# Patient Record
Sex: Female | Born: 1945
Health system: Southern US, Community
[De-identification: ages and names within clinical notes are randomized; demographics above are authoritative.]

## PROBLEM LIST (undated history)

## (undated) DIAGNOSIS — G4733 Obstructive sleep apnea (adult) (pediatric): Secondary | ICD-10-CM

## (undated) DIAGNOSIS — E785 Hyperlipidemia, unspecified: Secondary | ICD-10-CM

## (undated) DIAGNOSIS — I219 Acute myocardial infarction, unspecified: Secondary | ICD-10-CM

## (undated) DIAGNOSIS — F32A Depression, unspecified: Secondary | ICD-10-CM

## (undated) DIAGNOSIS — I2510 Atherosclerotic heart disease of native coronary artery without angina pectoris: Secondary | ICD-10-CM

## (undated) DIAGNOSIS — F419 Anxiety disorder, unspecified: Secondary | ICD-10-CM

## (undated) DIAGNOSIS — G8929 Other chronic pain: Secondary | ICD-10-CM

## (undated) DIAGNOSIS — J449 Chronic obstructive pulmonary disease, unspecified: Secondary | ICD-10-CM

## (undated) DIAGNOSIS — K449 Diaphragmatic hernia without obstruction or gangrene: Secondary | ICD-10-CM

## (undated) DIAGNOSIS — J189 Pneumonia, unspecified organism: Secondary | ICD-10-CM

## (undated) DIAGNOSIS — I209 Angina pectoris, unspecified: Secondary | ICD-10-CM

## (undated) DIAGNOSIS — F329 Major depressive disorder, single episode, unspecified: Secondary | ICD-10-CM

## (undated) DIAGNOSIS — Z8679 Personal history of other diseases of the circulatory system: Secondary | ICD-10-CM

## (undated) DIAGNOSIS — I10 Essential (primary) hypertension: Secondary | ICD-10-CM

## (undated) DIAGNOSIS — Z9989 Dependence on other enabling machines and devices: Secondary | ICD-10-CM

## (undated) DIAGNOSIS — I499 Cardiac arrhythmia, unspecified: Secondary | ICD-10-CM

## (undated) DIAGNOSIS — M419 Scoliosis, unspecified: Secondary | ICD-10-CM

## (undated) DIAGNOSIS — H3530 Unspecified macular degeneration: Secondary | ICD-10-CM

## (undated) DIAGNOSIS — Z9861 Coronary angioplasty status: Principal | ICD-10-CM

## (undated) DIAGNOSIS — M1990 Unspecified osteoarthritis, unspecified site: Secondary | ICD-10-CM

## (undated) DIAGNOSIS — K219 Gastro-esophageal reflux disease without esophagitis: Secondary | ICD-10-CM

## (undated) DIAGNOSIS — R0600 Dyspnea, unspecified: Secondary | ICD-10-CM

## (undated) DIAGNOSIS — M549 Dorsalgia, unspecified: Secondary | ICD-10-CM

## (undated) DIAGNOSIS — E119 Type 2 diabetes mellitus without complications: Secondary | ICD-10-CM

## (undated) HISTORY — DX: Hyperlipidemia, unspecified: E78.5

## (undated) HISTORY — DX: Personal history of other diseases of the circulatory system: Z86.79

## (undated) HISTORY — PX: CORONARY ANGIOPLASTY: SHX604

## (undated) HISTORY — DX: Atherosclerotic heart disease of native coronary artery without angina pectoris: I25.10

## (undated) HISTORY — PX: KNEE ARTHROSCOPY: SHX127

## (undated) HISTORY — PX: VAGINAL HYSTERECTOMY: SUR661

## (undated) HISTORY — DX: Diaphragmatic hernia without obstruction or gangrene: K44.9

## (undated) HISTORY — PX: PARS PLANA VITRECTOMY W/ REPAIR OF MACULAR HOLE: SHX2170

## (undated) HISTORY — DX: Coronary angioplasty status: Z98.61

## (undated) HISTORY — PX: CARDIAC CATHETERIZATION: SHX172

## (undated) HISTORY — PX: DILATION AND CURETTAGE OF UTERUS: SHX78

## (undated) HISTORY — PX: DIAGNOSTIC LAPAROSCOPY: SUR761

## (undated) HISTORY — DX: Anxiety disorder, unspecified: F41.9

## (undated) HISTORY — DX: Obstructive sleep apnea (adult) (pediatric): G47.33

## (undated) HISTORY — PX: LIPOMA EXCISION: SHX5283

## (undated) HISTORY — DX: Essential (primary) hypertension: I10

## (undated) HISTORY — PX: EYE SURGERY: SHX253

---

## 2000-01-05 ENCOUNTER — Encounter: Payer: Self-pay | Admitting: Ophthalmology

## 2000-01-07 ENCOUNTER — Ambulatory Visit (HOSPITAL_COMMUNITY): Admission: RE | Admit: 2000-01-07 | Discharge: 2000-01-08 | Payer: Self-pay | Admitting: Ophthalmology

## 2001-04-09 ENCOUNTER — Ambulatory Visit (HOSPITAL_COMMUNITY): Admission: RE | Admit: 2001-04-09 | Discharge: 2001-04-09 | Payer: Self-pay | Admitting: Orthopedic Surgery

## 2001-04-09 ENCOUNTER — Encounter: Payer: Self-pay | Admitting: Orthopedic Surgery

## 2001-06-13 ENCOUNTER — Ambulatory Visit (HOSPITAL_COMMUNITY): Admission: RE | Admit: 2001-06-13 | Discharge: 2001-06-13 | Payer: Self-pay | Admitting: *Deleted

## 2001-06-13 ENCOUNTER — Other Ambulatory Visit: Admission: RE | Admit: 2001-06-13 | Discharge: 2001-06-13 | Payer: Self-pay | Admitting: *Deleted

## 2001-06-13 ENCOUNTER — Encounter: Payer: Self-pay | Admitting: *Deleted

## 2001-09-10 ENCOUNTER — Ambulatory Visit (HOSPITAL_COMMUNITY): Admission: RE | Admit: 2001-09-10 | Discharge: 2001-09-10 | Payer: Self-pay | Admitting: Family Medicine

## 2001-09-10 ENCOUNTER — Encounter: Payer: Self-pay | Admitting: Family Medicine

## 2002-01-15 ENCOUNTER — Encounter: Payer: Self-pay | Admitting: Family Medicine

## 2002-01-15 ENCOUNTER — Ambulatory Visit (HOSPITAL_COMMUNITY): Admission: RE | Admit: 2002-01-15 | Discharge: 2002-01-15 | Payer: Self-pay | Admitting: Family Medicine

## 2002-02-12 ENCOUNTER — Encounter: Payer: Self-pay | Admitting: Internal Medicine

## 2002-02-12 ENCOUNTER — Ambulatory Visit (HOSPITAL_COMMUNITY): Admission: RE | Admit: 2002-02-12 | Discharge: 2002-02-12 | Payer: Self-pay | Admitting: Internal Medicine

## 2002-03-01 ENCOUNTER — Encounter: Payer: Self-pay | Admitting: Orthopaedic Surgery

## 2002-03-01 ENCOUNTER — Ambulatory Visit (HOSPITAL_COMMUNITY): Admission: RE | Admit: 2002-03-01 | Discharge: 2002-03-01 | Payer: Self-pay | Admitting: Orthopaedic Surgery

## 2002-06-19 ENCOUNTER — Ambulatory Visit (HOSPITAL_COMMUNITY): Admission: RE | Admit: 2002-06-19 | Discharge: 2002-06-19 | Payer: Self-pay | Admitting: Family Medicine

## 2002-06-19 ENCOUNTER — Encounter: Payer: Self-pay | Admitting: Family Medicine

## 2002-10-08 ENCOUNTER — Ambulatory Visit (HOSPITAL_COMMUNITY): Admission: RE | Admit: 2002-10-08 | Discharge: 2002-10-08 | Payer: Self-pay | Admitting: Ophthalmology

## 2003-09-03 ENCOUNTER — Encounter: Payer: Self-pay | Admitting: Family Medicine

## 2003-09-03 ENCOUNTER — Ambulatory Visit (HOSPITAL_COMMUNITY): Admission: RE | Admit: 2003-09-03 | Discharge: 2003-09-03 | Payer: Self-pay | Admitting: Family Medicine

## 2003-10-10 ENCOUNTER — Observation Stay (HOSPITAL_COMMUNITY): Admission: RE | Admit: 2003-10-10 | Discharge: 2003-10-11 | Payer: Self-pay | Admitting: Cardiology

## 2004-02-05 ENCOUNTER — Ambulatory Visit (HOSPITAL_COMMUNITY): Admission: RE | Admit: 2004-02-05 | Discharge: 2004-02-05 | Payer: Self-pay | Admitting: Orthopaedic Surgery

## 2004-02-08 ENCOUNTER — Inpatient Hospital Stay (HOSPITAL_COMMUNITY): Admission: EM | Admit: 2004-02-08 | Discharge: 2004-02-11 | Payer: Self-pay | Admitting: Emergency Medicine

## 2004-03-04 ENCOUNTER — Ambulatory Visit (HOSPITAL_COMMUNITY): Admission: RE | Admit: 2004-03-04 | Discharge: 2004-03-04 | Payer: Self-pay | Admitting: Family Medicine

## 2004-12-23 ENCOUNTER — Observation Stay (HOSPITAL_COMMUNITY): Admission: AD | Admit: 2004-12-23 | Discharge: 2004-12-25 | Payer: Self-pay | Admitting: Internal Medicine

## 2005-03-25 ENCOUNTER — Ambulatory Visit (HOSPITAL_COMMUNITY): Admission: RE | Admit: 2005-03-25 | Discharge: 2005-03-25 | Payer: Self-pay | Admitting: Family Medicine

## 2005-03-29 ENCOUNTER — Ambulatory Visit (HOSPITAL_COMMUNITY): Admission: RE | Admit: 2005-03-29 | Discharge: 2005-03-29 | Payer: Self-pay | Admitting: Family Medicine

## 2005-04-18 ENCOUNTER — Emergency Department (HOSPITAL_COMMUNITY): Admission: EM | Admit: 2005-04-18 | Discharge: 2005-04-18 | Payer: Self-pay | Admitting: Emergency Medicine

## 2005-04-28 ENCOUNTER — Emergency Department (HOSPITAL_COMMUNITY): Admission: EM | Admit: 2005-04-28 | Discharge: 2005-04-28 | Payer: Self-pay | Admitting: *Deleted

## 2005-07-11 ENCOUNTER — Ambulatory Visit (HOSPITAL_COMMUNITY): Admission: RE | Admit: 2005-07-11 | Discharge: 2005-07-11 | Payer: Self-pay | Admitting: Family Medicine

## 2005-09-30 ENCOUNTER — Ambulatory Visit (HOSPITAL_COMMUNITY): Admission: RE | Admit: 2005-09-30 | Discharge: 2005-09-30 | Payer: Self-pay | Admitting: Family Medicine

## 2005-11-29 ENCOUNTER — Ambulatory Visit (HOSPITAL_COMMUNITY): Admission: RE | Admit: 2005-11-29 | Discharge: 2005-11-29 | Payer: Self-pay | Admitting: Family Medicine

## 2005-12-26 DIAGNOSIS — I219 Acute myocardial infarction, unspecified: Secondary | ICD-10-CM

## 2005-12-26 HISTORY — DX: Acute myocardial infarction, unspecified: I21.9

## 2005-12-27 ENCOUNTER — Encounter (INDEPENDENT_AMBULATORY_CARE_PROVIDER_SITE_OTHER): Payer: Self-pay | Admitting: *Deleted

## 2005-12-27 ENCOUNTER — Ambulatory Visit (HOSPITAL_COMMUNITY): Admission: RE | Admit: 2005-12-27 | Discharge: 2005-12-27 | Payer: Self-pay | Admitting: General Surgery

## 2006-07-04 ENCOUNTER — Ambulatory Visit (HOSPITAL_COMMUNITY): Admission: RE | Admit: 2006-07-04 | Discharge: 2006-07-04 | Payer: Self-pay | Admitting: *Deleted

## 2006-07-11 ENCOUNTER — Inpatient Hospital Stay (HOSPITAL_COMMUNITY): Admission: RE | Admit: 2006-07-11 | Discharge: 2006-07-19 | Payer: Self-pay | Admitting: Cardiology

## 2006-08-18 ENCOUNTER — Ambulatory Visit (HOSPITAL_COMMUNITY): Admission: RE | Admit: 2006-08-18 | Discharge: 2006-08-18 | Payer: Self-pay | Admitting: *Deleted

## 2006-08-22 ENCOUNTER — Ambulatory Visit (HOSPITAL_COMMUNITY): Admission: RE | Admit: 2006-08-22 | Discharge: 2006-08-22 | Payer: Self-pay | Admitting: *Deleted

## 2006-09-01 ENCOUNTER — Encounter (HOSPITAL_COMMUNITY): Admission: RE | Admit: 2006-09-01 | Discharge: 2006-09-23 | Payer: Self-pay | Admitting: *Deleted

## 2006-09-25 ENCOUNTER — Encounter (HOSPITAL_COMMUNITY): Admission: RE | Admit: 2006-09-25 | Discharge: 2006-10-25 | Payer: Self-pay | Admitting: *Deleted

## 2006-09-27 ENCOUNTER — Ambulatory Visit (HOSPITAL_COMMUNITY): Admission: RE | Admit: 2006-09-27 | Discharge: 2006-09-27 | Payer: Self-pay | Admitting: Family Medicine

## 2006-10-27 ENCOUNTER — Inpatient Hospital Stay (HOSPITAL_COMMUNITY): Admission: EM | Admit: 2006-10-27 | Discharge: 2006-11-01 | Payer: Self-pay | Admitting: Cardiology

## 2006-10-27 ENCOUNTER — Encounter (HOSPITAL_COMMUNITY): Admission: RE | Admit: 2006-10-27 | Discharge: 2006-11-26 | Payer: Self-pay | Admitting: *Deleted

## 2006-10-31 ENCOUNTER — Encounter (INDEPENDENT_AMBULATORY_CARE_PROVIDER_SITE_OTHER): Payer: Self-pay | Admitting: Specialist

## 2006-11-06 ENCOUNTER — Ambulatory Visit: Payer: Self-pay | Admitting: Internal Medicine

## 2006-11-27 ENCOUNTER — Encounter (HOSPITAL_COMMUNITY): Admission: RE | Admit: 2006-11-27 | Discharge: 2006-12-25 | Payer: Self-pay | Admitting: *Deleted

## 2007-01-01 ENCOUNTER — Encounter (HOSPITAL_COMMUNITY): Admission: RE | Admit: 2007-01-01 | Discharge: 2007-01-31 | Payer: Self-pay | Admitting: *Deleted

## 2007-01-02 ENCOUNTER — Ambulatory Visit (HOSPITAL_COMMUNITY): Admission: RE | Admit: 2007-01-02 | Discharge: 2007-01-02 | Payer: Self-pay | Admitting: Family Medicine

## 2007-01-22 ENCOUNTER — Observation Stay (HOSPITAL_COMMUNITY): Admission: EM | Admit: 2007-01-22 | Discharge: 2007-01-23 | Payer: Self-pay | Admitting: Emergency Medicine

## 2007-02-07 ENCOUNTER — Encounter (HOSPITAL_COMMUNITY): Admission: RE | Admit: 2007-02-07 | Discharge: 2007-03-09 | Payer: Self-pay | Admitting: *Deleted

## 2007-03-14 ENCOUNTER — Encounter (HOSPITAL_COMMUNITY): Admission: RE | Admit: 2007-03-14 | Discharge: 2007-04-13 | Payer: Self-pay | Admitting: *Deleted

## 2007-06-12 ENCOUNTER — Ambulatory Visit: Payer: Self-pay | Admitting: Gastroenterology

## 2007-06-12 LAB — CONVERTED CEMR LAB
Eosinophils Absolute: 0.2 10*3/uL (ref 0.0–0.6)
Eosinophils Relative: 2 % (ref 0.0–5.0)
HCT: 38.6 % (ref 36.0–46.0)
Lymphocytes Relative: 22.4 % (ref 12.0–46.0)
MCHC: 34.2 g/dL (ref 30.0–36.0)
Monocytes Relative: 6.6 % (ref 3.0–11.0)
Neutro Abs: 5.4 10*3/uL (ref 1.4–7.7)
RBC: 4.31 M/uL (ref 3.87–5.11)

## 2007-06-15 ENCOUNTER — Ambulatory Visit: Payer: Self-pay | Admitting: Gastroenterology

## 2007-08-24 ENCOUNTER — Emergency Department (HOSPITAL_COMMUNITY): Admission: EM | Admit: 2007-08-24 | Discharge: 2007-08-24 | Payer: Self-pay | Admitting: Emergency Medicine

## 2007-09-03 ENCOUNTER — Encounter (HOSPITAL_COMMUNITY): Admission: RE | Admit: 2007-09-03 | Discharge: 2007-09-25 | Payer: Self-pay | Admitting: Family Medicine

## 2007-10-15 ENCOUNTER — Ambulatory Visit (HOSPITAL_COMMUNITY): Admission: RE | Admit: 2007-10-15 | Discharge: 2007-10-15 | Payer: Self-pay | Admitting: Family Medicine

## 2007-11-05 ENCOUNTER — Encounter (HOSPITAL_COMMUNITY): Admission: RE | Admit: 2007-11-05 | Discharge: 2007-12-05 | Payer: Self-pay | Admitting: Family Medicine

## 2007-11-14 ENCOUNTER — Ambulatory Visit (HOSPITAL_COMMUNITY): Admission: RE | Admit: 2007-11-14 | Discharge: 2007-11-14 | Payer: Self-pay | Admitting: *Deleted

## 2007-11-19 ENCOUNTER — Ambulatory Visit (HOSPITAL_COMMUNITY): Admission: RE | Admit: 2007-11-19 | Discharge: 2007-11-19 | Payer: Self-pay | Admitting: *Deleted

## 2007-12-07 ENCOUNTER — Encounter (HOSPITAL_COMMUNITY): Admission: RE | Admit: 2007-12-07 | Discharge: 2008-01-06 | Payer: Self-pay | Admitting: Family Medicine

## 2008-01-07 ENCOUNTER — Encounter (HOSPITAL_COMMUNITY): Admission: RE | Admit: 2008-01-07 | Discharge: 2008-02-06 | Payer: Self-pay | Admitting: Family Medicine

## 2008-02-20 ENCOUNTER — Encounter (HOSPITAL_COMMUNITY): Admission: RE | Admit: 2008-02-20 | Discharge: 2008-03-21 | Payer: Self-pay | Admitting: Family Medicine

## 2008-03-31 ENCOUNTER — Encounter (HOSPITAL_COMMUNITY): Admission: RE | Admit: 2008-03-31 | Discharge: 2008-04-30 | Payer: Self-pay | Admitting: Family Medicine

## 2008-04-19 DIAGNOSIS — E039 Hypothyroidism, unspecified: Secondary | ICD-10-CM | POA: Insufficient documentation

## 2008-04-19 DIAGNOSIS — F341 Dysthymic disorder: Secondary | ICD-10-CM | POA: Insufficient documentation

## 2008-04-19 DIAGNOSIS — E119 Type 2 diabetes mellitus without complications: Secondary | ICD-10-CM | POA: Insufficient documentation

## 2008-04-19 DIAGNOSIS — K625 Hemorrhage of anus and rectum: Secondary | ICD-10-CM | POA: Insufficient documentation

## 2008-04-19 DIAGNOSIS — M129 Arthropathy, unspecified: Secondary | ICD-10-CM | POA: Insufficient documentation

## 2008-04-19 DIAGNOSIS — K219 Gastro-esophageal reflux disease without esophagitis: Secondary | ICD-10-CM | POA: Insufficient documentation

## 2008-04-19 DIAGNOSIS — I10 Essential (primary) hypertension: Secondary | ICD-10-CM | POA: Insufficient documentation

## 2008-04-19 DIAGNOSIS — I219 Acute myocardial infarction, unspecified: Secondary | ICD-10-CM | POA: Insufficient documentation

## 2008-04-19 DIAGNOSIS — K559 Vascular disorder of intestine, unspecified: Secondary | ICD-10-CM | POA: Insufficient documentation

## 2008-05-23 ENCOUNTER — Ambulatory Visit (HOSPITAL_COMMUNITY): Admission: RE | Admit: 2008-05-23 | Discharge: 2008-05-23 | Payer: Self-pay | Admitting: Internal Medicine

## 2008-06-24 ENCOUNTER — Ambulatory Visit (HOSPITAL_COMMUNITY): Admission: RE | Admit: 2008-06-24 | Discharge: 2008-06-24 | Payer: Self-pay | Admitting: Internal Medicine

## 2008-07-04 ENCOUNTER — Ambulatory Visit: Admission: RE | Admit: 2008-07-04 | Discharge: 2008-07-04 | Payer: Self-pay | Admitting: Internal Medicine

## 2008-07-14 ENCOUNTER — Inpatient Hospital Stay (HOSPITAL_COMMUNITY): Admission: EM | Admit: 2008-07-14 | Discharge: 2008-07-16 | Payer: Self-pay | Admitting: Emergency Medicine

## 2008-07-21 ENCOUNTER — Ambulatory Visit: Payer: Self-pay | Admitting: Internal Medicine

## 2008-09-30 ENCOUNTER — Ambulatory Visit (HOSPITAL_COMMUNITY): Admission: RE | Admit: 2008-09-30 | Discharge: 2008-09-30 | Payer: Self-pay | Admitting: Family Medicine

## 2008-10-17 ENCOUNTER — Ambulatory Visit (HOSPITAL_COMMUNITY): Admission: RE | Admit: 2008-10-17 | Discharge: 2008-10-17 | Payer: Self-pay | Admitting: Family Medicine

## 2009-02-26 ENCOUNTER — Ambulatory Visit (HOSPITAL_COMMUNITY): Admission: RE | Admit: 2009-02-26 | Discharge: 2009-02-26 | Payer: Self-pay | Admitting: Family Medicine

## 2009-10-20 ENCOUNTER — Ambulatory Visit (HOSPITAL_COMMUNITY): Admission: RE | Admit: 2009-10-20 | Discharge: 2009-10-20 | Payer: Self-pay | Admitting: Family Medicine

## 2010-01-30 ENCOUNTER — Observation Stay (HOSPITAL_COMMUNITY): Admission: EM | Admit: 2010-01-30 | Discharge: 2010-01-31 | Payer: Self-pay | Admitting: Emergency Medicine

## 2010-10-14 ENCOUNTER — Inpatient Hospital Stay (HOSPITAL_COMMUNITY): Admission: EM | Admit: 2010-10-14 | Discharge: 2010-10-15 | Payer: Self-pay | Admitting: Emergency Medicine

## 2010-10-21 ENCOUNTER — Ambulatory Visit (HOSPITAL_COMMUNITY): Admission: RE | Admit: 2010-10-21 | Discharge: 2010-10-21 | Payer: Self-pay | Admitting: Family Medicine

## 2010-11-19 ENCOUNTER — Ambulatory Visit (HOSPITAL_COMMUNITY): Admission: RE | Admit: 2010-11-19 | Discharge: 2010-11-19 | Payer: Self-pay | Admitting: Family Medicine

## 2011-03-09 LAB — DIFFERENTIAL
Basophils Absolute: 0 10*3/uL (ref 0.0–0.1)
Basophils Relative: 0 % (ref 0–1)
Eosinophils Relative: 2 % (ref 0–5)
Monocytes Absolute: 0.5 10*3/uL (ref 0.1–1.0)
Neutrophils Relative %: 60 % (ref 43–77)

## 2011-03-09 LAB — CBC
Hemoglobin: 12.4 g/dL (ref 12.0–15.0)
MCH: 30 pg (ref 26.0–34.0)
MCHC: 32.8 g/dL (ref 30.0–36.0)
MCV: 91.5 fL (ref 78.0–100.0)
RBC: 4.13 MIL/uL (ref 3.87–5.11)
WBC: 6.9 10*3/uL (ref 4.0–10.5)

## 2011-03-09 LAB — CARDIAC PANEL(CRET KIN+CKTOT+MB+TROPI)
CK, MB: 0.8 ng/mL (ref 0.3–4.0)
Relative Index: INVALID (ref 0.0–2.5)
Relative Index: INVALID (ref 0.0–2.5)
Total CK: 26 U/L (ref 7–177)
Troponin I: <0.01 ng/mL (ref 0.00–0.06)
Troponin I: <0.01 ng/mL (ref 0.00–0.06)

## 2011-03-09 LAB — BASIC METABOLIC PANEL
Calcium: 9.1 mg/dL (ref 8.4–10.5)
Chloride: 109 mEq/L (ref 96–112)
GFR calc non Af Amer: >60 mL/min (ref >60)
Glucose, Bld: 86 mg/dL (ref 70–99)
Potassium: 3.9 mEq/L (ref 3.5–5.1)
Sodium: 138 mEq/L (ref 135–145)

## 2011-03-09 LAB — POCT CARDIAC MARKERS
CKMB, poc: <1 ng/mL — ABNORMAL LOW (ref 1.0–8.0)
Myoglobin, poc: 17.6 ng/mL (ref 12–200)
Troponin i, poc: <0.05 ng/mL (ref 0.00–0.09)

## 2011-03-09 LAB — APTT: aPTT: 26 seconds (ref 24–37)

## 2011-03-18 LAB — LIPID PANEL
HDL: 69 mg/dL (ref >39)
LDL Cholesterol: 59 mg/dL (ref 0–99)
Total CHOL/HDL Ratio: 2.2 RATIO
Triglycerides: 125 mg/dL (ref <150)
VLDL: 25 mg/dL (ref 0–40)

## 2011-03-18 LAB — HEPATIC FUNCTION PANEL
AST: 20 U/L (ref 0–37)
Albumin: 3.4 g/dL — ABNORMAL LOW (ref 3.5–5.2)
Alkaline Phosphatase: 37 U/L — ABNORMAL LOW (ref 39–117)
Total Protein: 5.8 g/dL — ABNORMAL LOW (ref 6.0–8.3)

## 2011-03-18 LAB — CK TOTAL AND CKMB (NOT AT ARMC)
Relative Index: INVALID (ref 0.0–2.5)
Total CK: 28 U/L (ref 7–177)

## 2011-03-18 LAB — CBC
Hemoglobin: 12.2 g/dL (ref 12.0–15.0)
MCHC: 34.6 g/dL (ref 30.0–36.0)
MCV: 92.6 fL (ref 78.0–100.0)
Platelets: 161 10*3/uL (ref 150–400)
RBC: 3.89 MIL/uL (ref 3.87–5.11)
RBC: 4.23 MIL/uL (ref 3.87–5.11)
RDW: 12.1 % (ref 11.5–15.5)
WBC: 4 10*3/uL (ref 4.0–10.5)

## 2011-03-18 LAB — BASIC METABOLIC PANEL
BUN: 7 mg/dL (ref 6–23)
CO2: 26 mEq/L (ref 19–32)
CO2: 28 mEq/L (ref 19–32)
Calcium: 9.6 mg/dL (ref 8.4–10.5)
Chloride: 103 mEq/L (ref 96–112)
Creatinine, Ser: 0.71 mg/dL (ref 0.4–1.2)
Glucose, Bld: 92 mg/dL (ref 70–99)
Glucose, Bld: 93 mg/dL (ref 70–99)
Potassium: 4.1 mEq/L (ref 3.5–5.1)
Sodium: 141 mEq/L (ref 135–145)

## 2011-03-18 LAB — DIFFERENTIAL
Basophils Relative: 0 % (ref 0–1)
Eosinophils Relative: 1 % (ref 0–5)
Monocytes Absolute: 0.6 10*3/uL (ref 0.1–1.0)
Monocytes Relative: 9 % (ref 3–12)
Neutro Abs: 4.9 10*3/uL (ref 1.7–7.7)

## 2011-03-18 LAB — HEMOGLOBIN A1C
Hgb A1c MFr Bld: 5.9 % (ref 4.6–6.1)
Mean Plasma Glucose: 123 mg/dL

## 2011-03-18 LAB — TROPONIN I: Troponin I: 0.03 ng/mL (ref 0.00–0.06)

## 2011-03-18 LAB — GLUCOSE, CAPILLARY: Glucose-Capillary: 104 mg/dL — ABNORMAL HIGH (ref 70–99)

## 2011-03-18 LAB — RAPID STREP SCREEN (MED CTR MEBANE ONLY): Streptococcus, Group A Screen (Direct): NEGATIVE

## 2011-03-18 LAB — CARDIAC PANEL(CRET KIN+CKTOT+MB+TROPI)
CK, MB: 0.7 ng/mL (ref 0.3–4.0)
Total CK: 30 U/L (ref 7–177)
Troponin I: 0.01 ng/mL (ref 0.00–0.06)

## 2011-05-10 NOTE — Procedures (Signed)
NAME:  Vickie [name], Vickie [name]              ACCOUNT NO.:  [id number]   MEDICAL RECORD NO.:  [id number]          PATIENT TYPE:  OUT   LOCATION:  RESP                          FACILITY:  [facility]   PHYSICIAN:  Edward L. Juanetta Gosling, M.D.DATE OF BIRTH:  02/10/1946   DATE OF PROCEDURE:  DATE OF DISCHARGE:  11/19/2007                            PULMONARY FUNCTION TEST   PULMONARY FUNCTION TEST   1. Spirometry shows no ventilatory defect and evidence of airflow      obstruction in the smaller airways.  2. Lung volumes are normal.  3. DLCO is normal.  4. Arterial blood gases are normal.  5. There is actually improvement compared to previous pulmonary      function test study done January 2008.      Edward L. Juanetta Gosling, M.D.  Electronically Signed     ELH/MEDQ  D:  11/25/2007  T:  11/25/2007  Job:  [id number]   cc:   Dani Gobble, MD  Fax: [phone]

## 2011-05-10 NOTE — Cardiovascular Report (Signed)
NAME:  Vickie [name], Vickie [name] NO.:  [id number]   MEDICAL RECORD NO.:  [id number]          PATIENT TYPE:  INP   LOCATION:  2016                         FACILITY:  [facility]   PHYSICIAN:  Thereasa Solo. Little, M.D. DATE OF BIRTH:  02-09-1946   DATE OF PROCEDURE:  DATE OF DISCHARGE:                            CARDIAC CATHETERIZATION   INDICATION FOR TEST:  This 65 year old female has chronic chest pain.  She had had a cardiac catheterization performed in 2007, that showed  mild disease in her LAD.  She has had continued complaints of chest pain  with no significant abnormality being found, however.  She does have  significant GERD, and she has marked right upper quadrant tenderness to  palpation.  She was referred from Dr. Sharyon Medicus  office to the emergency  room with complaints of chest pain.  Her EKG was unremarkable and the  decision was made to take her to the cath lab to resolve the issue of  whether or not this could be cardiac.   After obtaining informed consent, the patient was prepped and draped in  usual sterile fashion exposing the right groin.  Following local  anesthetic with 1% Xylocaine, the Seldinger technique was employed and 5-  [place] introducer sheath was placed in the right femoral artery.  A  Spartan needle was used.  Left and right coronary arteriography and  ventriculogram in the RAO projection was performed.   COMPLICATIONS:  None.   EQUIPMENT:  5-French Judkins configuration catheters.   TOTAL CONTRAST USED:  80 mL.   RESULTS:  1. Hemodynamic monitoring, her central aortic pressure was 131/72.      Left ventricular pressure was 137/6 and the left ventricular end-      diastolic pressure was 11.  2. Ventriculography.  Ventriculography in the RAO projection done at      the end of procedure using 25 mL of contrast at 12 mL per second      showed the LV to be hyperdynamic with an ejection fraction of      excess of 75% and an end-diastolic pressure of  11.  3. Coronary arteriography.      a.     Left main normal, it bifurcated.      b.     LAD.  There were minimal proximal irregularities in the LAD.       The LAD extended down and around the apex of the heart and       actually supplied the distribution to the PDA  There was a very       large diagonal vessel that was free of disease.  The first septal       perforator which was a relatively long but small in diameter       vessel had an area of 80% narrowing in its ostium.  This type       lesion is not amenable to any type of percutaneous intervention       and is best treated medically.      c.     Circumflex.  The circumflex gave rise to  a very large OM       system that had 3 large OM vessels, all of which were free of       disease.      d.     Right coronary artery.  This was the nondominant vessel.       There was no evidence of a dissection that was previously       mentioned in 2007.   CONCLUSION:  1. Hyperdynamic left ventricle.  2. No significant occlusive disease in her major vessels with ostial      80% narrowing of the first septal perforator.   I have started her on Toprol-XL 25 mg because of her hyperdynamic left  ventricle.  I would encourage avoidance of nitroglycerin and calcium  channel-blockers other than verapamil or Cardizem.  Her liver functions  are normal, but she would probably benefit from a gallbladder scan  because of the abnormal physical exam, which includes marked tenderness.  If this is unremarkable, endoscopy may be an additional avenue to pursue  in view of her significant history of GERD.           ______________________________  Thereasa Solo. Little, M.D.     ABL/MEDQ  D:  07/14/2008  T:  07/15/2008  Job:  [id number]   cc:   Dani Gobble, MD  Madelin Rear. Sherwood Gambler, MD  Cardiac Catheterization Lab

## 2011-05-10 NOTE — Assessment & Plan Note (Signed)
[facility]                         GASTROENTEROLOGY OFFICE NOTE   Vickie [name], Vickie [name]                     MRN:          [id number]  DATE:06/12/2007                            DOB:          18-Feb-1946    REFERRING PHYSICIAN:  Kirk Ruths, M.D.   Dr. Regino Schultze asked me to evaluate Vickie [name] in consultation regarding  rectal bleeding.   HISTORY OF PRESENT ILLNESS:  Vickie [name] is a very pleasant 65 year old  woman, who has had at least 3 episodes of crampy abdominal discomfort  followed by bloody diarrhea.  She has been evaluated by 2 different  gastroenterologists including Dr. Juanda Chance and Dr. Jena Gauss.  Dr. Jena Gauss saw  her during one of these episodes.  He performed a colonoscopy on her in  2005, and he described some segmental colitis at the area of the splenic  flexure.  He felt this was most consistent with ischemic or segmental  colitis.  Biopsies were performed, but we do not have those available at  the time of this visit.  She also has minor rectal bleeding on a more  frequent basis that is not associated with cramping or diarrhea.  She  has not had labs checked recently, but she does not appear to be anemic  clinically.   She also has had chest pains, and there is a question of underlying  GERD.  She had EGD by Dr. Lina Sar while she was hospitalized in  November 2007.  She had an EGD that was essentially normal except for a  very small hiatal hernia.  She also had a barium esophagram suggesting  normal esophageal motility as well as a small sliding hernia with some  mild GE reflux.  She has been prescribed twice daily Nexium, but she  does not take it due to cough.  She does take once daily Nexium  approximately 2-3 hours prior to her breakfast meal.   REVIEW OF SYSTEMS:  Intentional 10 pound weight loss in the past year.  The rest of her review of systems is essentially normal and is available  on our nursing intake sheet.   PAST MEDICAL HISTORY:  1. Coronary artery disease with a heart attack in 2007.  2. Diabetes.  3. Elevated cholesterol.  4. Arthritis.  5. Anxiety.  6. Depression.  7. Lipoma removed in 2006.  8. Hypertension.   CURRENT MEDICINES:  Plavix and aspirin, both of which are on hold by her  primary care physician for the past week or so since her bleeding  episode.  Nexium, Fosamax, Remeron, Crestor, Tricor, Premarin, calcium,  Toprol, isosorbide, Zestril, Neurontin, Lexapro.   ALLERGIES:  PENICILLIN AND KEFLEX.   SOCIAL HISTORY:  Married with 4 children, retired, nonsmoker,  nondrinker.   FAMILY HISTORY:  No colon cancer, polyps in family.   PHYSICAL EXAMINATION:  VITAL SIGNS:  Weight 192 pounds, blood pressure  96/64, pulse 64.  CONSTITUTIONAL:  Generally well-appearing.  NEUROLOGIC:  Alert and oriented x3.  EYES:  Extraocular movements intact.  MOUTH:  Oropharynx moist, no lesions.  NECK:  Supple, no lymphadenopathy.  PERIVASCULAR:  Heart regular rate  and rhythm.  LUNGS:  Clear to auscultation bilaterally.  ABDOMEN:  Soft, nontender, nondistended.  Normal bowel sounds.  EXTREMITIES:  No lower extremity edema.  SKIN:  No rashes or lesions visible on extremities.   ASSESSMENT/PLAN:  A 65 year old woman with likely gastroesophageal  reflux disease, recent rectal bleeding, history of segmental colitis.   First, she does have gastroesophageal reflux on a barium swallow.  Fortunately, she has no esophageal damage based on an EGD performed by  Dr. Juanda Chance in late 2007.  I recommended that she take her Nexium once  daily, 20-30 minutes prior to her breakfast meals.  That is the way the  pill is designed to work most effectively.   She has had 3-4 episodes of crampy, bloody diarrhea.  This is more  consistent with recurrent ischemic colitis more so than inflammatory  bowel disease, as her bowels seem to be very normal in between these.  She does have minor trace rectal bleeding in  between the episodes, but  that sounds more anorectal and possibly a hemorrhoid.  She has been  evaluated by gastroenterology, Dr. Jena Gauss, for his same problem, and he  saw segmental colitis at her splenic flexure which could be Crohn's  disease but seems more likely ischemic based on her clinical history.  I  think we should repeat this colonoscopy now to see if she has developed  any more florid colitis.  Her Plavix and aspirin are on hold by her  primary care physician, so I think the sooner we do that the better.  We  will arrange to get her done either tomorrow or later this week.  We  will also get a CBC to see if she is anemic.  She does not appear to be  so clinically.     Rachael Fee, MD  Electronically Signed    DPJ/MedQ  DD: 06/12/2007  DT: 06/12/2007  Job #: [id number]   cc:   Kirk Ruths, M.D.

## 2011-05-10 NOTE — Procedures (Signed)
NAME:  Vickie [name], Vickie [name]              ACCOUNT NO.:  [id number]   MEDICAL RECORD NO.:  [id number]          PATIENT TYPE:  OUT   LOCATION:  SLEE                          FACILITY:  [facility]   PHYSICIAN:  Kofi A. Gerilyn Pilgrim, M.D. DATE OF BIRTH:  Mar 04, 1946   DATE OF PROCEDURE:  07/04/2008  DATE OF DISCHARGE:  07/04/2008                             SLEEP DISORDER REPORT   REFERRING PHYSICIAN:  Madelin Rear. Sherwood Gambler, MD   INDICATIONS:  This is 65 year old lady who presents with snoring and  insomnia.  She is being evaluated for obstructive sleep apnea syndrome.   MEDICATIONS:  1. Lisinopril.  2. Premarin.  3. Remeron.  4. Neurontin.  5. Metoprolol.  6. Isosorbide dinitrate.  7. Prilosec.  8. Aspirin.   Epworth sleepiness scale 2.  BMI 38.   SLEEP STAGE SUMMARY:  The total recording time is 471 minutes.  Sleep  efficiency 97%.  Sleep latency 10 minutes.  REM latency 426 minutes.  Stage N1 1.5%, N2 61%, N3 30%, and REM sleep 7.7%.   RESPIRATORY SUMMARY:  Baseline oxygen saturation 95% with lowest  saturation being 86%.  The AHI is 9.3.   LIMB MOVEMENT SUMMARY:  PLM index 26.   ELECTROCARDIOGRAM SUMMARY:  Average heart rate 54 with no significant  dysrhythmias observed.   IMPRESSION:  1. Mild obstructive sleep apnea syndrome.  2. Moderate periodic limb movement disorder of sleep.     Kofi A. Gerilyn Pilgrim, M.D.  Electronically Signed    KAD/MEDQ  D:  07/11/2008  T:  07/12/2008  Job:  [id number]

## 2011-05-13 NOTE — Discharge Summary (Signed)
NAME:  Vickie [name], Vickie [name]                        ACCOUNT NO.:  [id number]   MEDICAL RECORD NO.:  [id number]                   PATIENT TYPE:  INP   LOCATION:  A212                                 FACILITY:  [facility]   PHYSICIAN:  Patrica Duel, M.D.                 DATE OF BIRTH:  April 19, 1946   DATE OF ADMISSION:  02/08/2004  DATE OF DISCHARGE:  02/11/2004                                 DISCHARGE SUMMARY   DISCHARGE DIAGNOSES:  1. Rectal bleeding secondary to segmental colitis or ischemic component.     Symptoms resolved.  2. History of osteoarthritis.  3. Diabetes mellitus type 2.  4. Arteriosclerotic cardiovascular disease (poorly documented).  Status post     knee surgery.  5. Osteoporosis on bisphosphonate therapy.   BRIEF HISTORY:  For details regarding admission please refer to admitting  note.  Briefly this is a 64 year old female with above history developed  diarrhea on February 07, 2004.  She began to notice her stools were somewhat  bloody and she presented to the emergency department for evaluation.  She  was mildly orthostatic with a normal hemoglobin/hematocrit.  She did have  gross blood of the rectal vault on physical examination.  She was admitted  for evaluation of diarrhea and acute lower GI bleed and questionable  etiology.  Of note, the patient had a colonoscopy in the past by Dr.  Lovell Sheehan.   COURSE IN THE HOSPITAL:  Patient's hemoglobin dropped approximately 2 g over  the next 24 hours.  She was seen by Dr. Jena Gauss in consultation.  Her diarrhea  resolved and did not recur upon admission.  A colonoscopy was performed  which revealed the presence of hemorrhoids and a focal area of erosions and  mucosal friability at about 20 cm.  Biopsies were obtained.  The initial  impression by Dr. Jena Gauss was that of ischemic colitis or segmental colitis  which should be self-limiting.   Patient has done very well since the procedure.  She has had no recurrent  bleeding, fever,  chills, nausea or vomiting.  She is anxious to be  discharged at this time which I feel is appropriate.   PLAN:  She is to continue her home medications which include Altace,  Celebrex, Premarin, Fosamax and Nexium.  She will be followed and treated  expectantly as an outpatient.  Dr. Jena Gauss or Karilyn Cota will see the patient  prior to discharge.     ___________________________________________                                         Patrica Duel, M.D.   MC/MEDQ  D:  02/11/2004  T:  02/11/2004  Job:  [id number]

## 2011-05-13 NOTE — Cardiovascular Report (Signed)
NAME:  Vickie [name], Vickie [name] NO.:  [id number]   MEDICAL RECORD NO.:  [id number]          PATIENT TYPE:  INP   LOCATION:  2807                         FACILITY:  [facility]   PHYSICIAN:  Madaline Savage, M.D.DATE OF BIRTH:  1946-04-11   DATE OF PROCEDURE:  07/11/2006  DATE OF DISCHARGE:                              CARDIAC CATHETERIZATION   PROCEDURES PERFORMED:  1.  Retrograde left heart catheterization.  2.  Left ventricular angiography.  3.  Aortic root angiography.   COMPLICATIONS:  None.   ENTRY SITE:  Right femoral.   DYE USED:  Omnipaque.   PATIENT PROFILE:  This patient is a 65 year old obese diabetic white female  with a history of cardiac catheterization done last year which showed  trivial disease in the proximal LAD, proximal RCA disease, and a small RCA.  It was treated medically.  Recently, the patient was seen and had a Myoview  stress test that was normal but she continued to complain of pain.  There  was the decision of Dr. Domingo Sep, her primary care cardiologist, to proceed  with outpatient catheterization which was begun at the [facility] this morning,  July 11, 2006.  There was noted to be a calcified  plaque involving the distal left main and proximal LAD, and there was noted  to be a 40-50% eccentric stenosis in the LAD.  The circumflex was basically  a dominant vessel without lesions.  The LAD itself showed no lesions and the  right coronary artery was very small and nondominant but was a vessel that  was hard to intubate with a 4-French right Judkins catheter without causing  deep throating of the catheter.  This happened twice and I pulled back on  the catheter because pressure damped.  After entering a third time more  cautiously I was able to inject dye and there saw a very small right  coronary artery and when the catheter was removed the patient's blood  pressure dropped, she began having pain, and she had bradycardias over  the  course of her of approximately a minute or so before any atropine was given.  We were about give atropine and the heart rate normalized.  Blood pressure  improved with IV fluid administration and then we began heparin and  nitroglycerin.  More Versed was given for chest pain, as well as 4 mg of  morphine.  The patient stabilized and we decided to move the patient from  [facility] to [facility], which was accomplished by way of  transport of the county EMS services.  Upon reaching the catheterization  laboratory here the patient had a repeat EKG which was normal without any ST-  segment depression or elevation and there was normal sinus rhythm.  After I  completed the LV angiogram, the aortic root angiogram and the retrograde  left heart catheterization, we moved the patient to coronary care unit for  further observation of what was felt to be acute coronary syndrome following  diagnostic cardiac catheterization that led to right coronary artery  dissection.   RESULTS OF ANGIOGRAPHY AT [facility]:  LV  pressure was 118/5, end-diastolic pressure 12, central aortic pressure  108/65.   ANGIOGRAPHIC RESULTS:  The left ventricular angiogram showed normal  contractility of all wall segments including inferior and apical walls.  Ejection fraction was estimated at 60-65%.  There was brisk filling of all  the vessels in the right coronary arterial tree.  Aortic root angiography  showed an unremarkable aortic root and there was filling of the proximal one-  third of the RCA before dye washed out of the aorta.  I never saw distal  filling of the RCA.   FINAL DIAGNOSES:  1.  Acute coronary artery dissection on an outpatient basis.  2.  Normal left ventricular systolic function.  3.  Normal EKG, normal hemodynamics, and resolution of chest pain by the      time the patient arrived at the [facility] catheterization laboratory.   PLAN:  Medical therapy  including anticoagulation, beta blockade,  continuation of ACE inhibitor and observation and support.  The patient will  also be loaded with Plavix and Integrilin will be used until the patient has  been loaded with Plavix.           ______________________________  Madaline Savage, M.D.     WHG/MEDQ  D:  07/11/2006  T:  07/11/2006  Job:  [id number]   cc:   Dani Gobble, MD  Fax: [phone]   [facility]

## 2011-05-13 NOTE — Op Note (Signed)
[facility]  Patient:    Vickie [name]                      MRN: [id number] Proc. Date: 01/07/00 Adm. Date:  [id number] Attending:  Ivor Messier CC:         Guadelupe Sabin, M.D.                           Operative Report  PREOPERATIVE DIAGNOSIS:  Retinal macular hole, right eye.  POSTOPERATIVE DIAGNOSIS:  Retinal macular hole, right eye.  OPERATION:  Posterior vitrectomy through pars plana using vitreous infusion suction cutter with membrane stripping.  SURGEON:   Guadelupe Sabin, M.D.  ASSISTANT:  Nurse.  ANESTHESIA:   General.  OPHTHALMOSCOPY:  As previously described.  DESCRIPTION OF PROCEDURE:  After the patient was prepped and draped, a lid speculum was inserted in the right eye.  The eye was turned downward, and a superior rectus traction suture placed.  A peritomy was performed adjacent to the limbus from the 8 to 2:30 position.  Three sclerotomy sites prepared 3.5 mm from the limbus using the MVR blades and 20-gauge needle.  The 4 mm vitreous infusion terminal was secured in place at the 8 oclock position with a 5-0 white mattress Dacron suture. The tip  could be seen projecting into the vitreous cavity.  The fiberoptic light pipe was inserted at the 2 oclock position and the hand piece, the vitreous infusion suction cutter at the 10 oclock position.   Slow vitreous infusion suction cutting were begun from an anterior to posterior direction.  As the retina approached, there appeared to be a sudden release of a diaphanous, very thin, translucent membrane from the retinal surface.  A round aperture was noted over the optic nerve.  This was felt to represent the attachment to the macular hole.  This membrane was carefully stripped from the additional further temporal nasal retina with the [facility] pick.  The Michels pick was then used to evaluate the surface f the retina.  There did not appear to be any other  membranes present.  The Tano diamond-dusted scraper was then inserted and radioscraping made around the edges of the macular hole, but no additional membranes were elevated.  It was then elected to close.  The vitreous fluid was then exchanged for air, and the fluid aspirated over a 10-minute period to allow complete aspiration.  The air was then exchanged for a 10% C3-F8 gas exchange.  The sclerotomy sites were closed with 7-0 Vicryl  sutures.  The conjunctiva was pulled forward and closed with a running 7-0 Vicryl suture.  Depo-garamycin and Celestone were injected in the sub-Tenon space inferiorly.  Maxitrol and atropine ointment were instilled in the conjunctival cul-de-sac after Schiotz tonometry was performed.  Schiotz reading was 15 scale  units with a 5.5 g weight indicating a relatively low intraocular pressure. The patient was taken, therefore to the recovery room and subsequently to the 23-hour observation unit to be placed on her left side temporarily and then assume the face-down positioning as previously described to the patient. Duration of procedure: 1-1/2 hours. DD:  01/07/00 TD:  01/07/00 Job: [id number]

## 2011-05-13 NOTE — Procedures (Signed)
NAME:  Vickie [name], Vickie [name]              ACCOUNT NO.:  [id number]   MEDICAL RECORD NO.:  [id number]          PATIENT TYPE:  OUT   LOCATION:  RESP                          FACILITY:  [facility]   PHYSICIAN:  Edward L. Juanetta Gosling, M.D.DATE OF BIRTH:  Nov 22, 1946   DATE OF PROCEDURE:  DATE OF DISCHARGE:  08/22/2006                              PULMONARY FUNCTION TEST   1. Spirometry shows a mild ventilatory defect with air flow obstruction      most marked in the smaller airways.  2. Lung volumes are normal.  3. The LCO is normal.  4. Arterial blood gases are normal.      Edward L. Juanetta Gosling, M.D.  Electronically Signed     ELH/MEDQ  D:  08/23/2006  T:  08/24/2006  Job:  [id number]   cc:   Dani Gobble, MD  Fax: [phone]

## 2011-05-13 NOTE — Cardiovascular Report (Signed)
   NAME:  Vickie [name], Vickie [name]                        ACCOUNT NO.:  [id number]   MEDICAL RECORD NO.:  [id number]                   PATIENT TYPE:  OIB   LOCATION:  2899                                 FACILITY:  [facility]   PHYSICIAN:  Balinda Quails, M.D.                 DATE OF BIRTH:  [birth year]   DATE OF PROCEDURE:  DATE OF DISCHARGE:                              CARDIAC CATHETERIZATION   No dictation for this job number.                                               Balinda Quails, M.D.    PGH/MEDQ  D:  10/10/2003  T:  10/10/2003  Job:  [id number]

## 2011-05-13 NOTE — Discharge Summary (Signed)
NAME:  Vickie [name], Vickie [name]              ACCOUNT NO.:  [id number]   MEDICAL RECORD NO.:  [id number]          PATIENT TYPE:  AMB   LOCATION:  DAY                          FACILITY:  [facility]   PHYSICIAN:  Timothy E. Earlene Plater, M.D. DATE OF BIRTH:  1946-07-14   DATE OF ADMISSION:  12/27/2005  DATE OF DISCHARGE:                                 DISCHARGE SUMMARY   PREOPERATIVE DIAGNOSIS:  Lipoma, right upper abdominal wall.   POSTOPERATIVE DIAGNOSIS:  Lipoma, right upper abdominal wall.   PROCEDURE:  Excision of lipoma.   SURGEON:  Timothy E. Earlene Plater, M.D.   ANESTHESIA:  General.   DESCRIPTION OF PROCEDURE:  Ms. Trumbo was referred to my office and has been  seen and evaluated by myself, her primary care, cardiologist and anesthesia.  She is felt safe to proceed with excision of this large lipoma, right upper  abdominal wall which she complains of pain and intrusion to certain  activities.  She has been seen, marked and the permit signed.   She was taken to the operating room, placed supine, LMA anesthesia provided.  The right abdominal wall was prepped and draped in the usual fashion.  Then  0.25% Marcaine with epinephrine was used throughout the area of the proposed  dissection and the straight horizontal dissection.  The mass palpated to be  8 x 11 cm arranged horizontally over the right upper quadrant.  The incision  was made, bleeding was cauterized, the lipomatous mass was easily  distinguished from the surrounding fatty tissue and it was sharply dissected  off the vessel fascia and from the surrounding fatty tissue.  The actual  tumor measured 6 x 3 x 10.  It was passed off the field as a specimen for  pathology.  The wound was made viable by cautery, double checked, more local  was injected and the wound was perfectly dry.  It was closed in layers with  3-0 Vicryl, 3-0 Monocryl, skin staples and Steri-strips.  The wound dry and  satisfactory and the counts were correct.  She tolerated this  well and was  awakened and taken to the recovery room in good condition.      Timothy E. Earlene Plater, M.D.  Electronically Signed     TED/MEDQ  D:  12/27/2005  T:  12/27/2005  Job:  [id number]   cc:   Kirk Ruths, M.D.  Fax: [phone]

## 2011-05-13 NOTE — Op Note (Signed)
NAME:  Vickie [name], Vickie [name]                        ACCOUNT NO.:  [id number]   MEDICAL RECORD NO.:  [id number]                   PATIENT TYPE:  INP   LOCATION:  A212                                 FACILITY:  [facility]   PHYSICIAN:  R. Roetta Sessions, M.D.              DATE OF BIRTH:  30-May-1946   DATE OF PROCEDURE:  02/10/2004  DATE OF DISCHARGE:                                 OPERATIVE REPORT   PROCEDURE:  Colonoscopy with ileoscopy biopsy.   INDICATIONS FOR PROCEDURE:  The patient is a 65 year old lady admitted to  the hospital with abdominal cramps and diarrhea followed by blood per  rectum.  Hemoglobin has dropped from 15 to 12.9 since admission and still  within the normal range. Bloody diarrhea has tapered off. Colonoscopy is now  being done. This approach has been discussed with the patient at length. The  potential risks, benefits, and alternatives have been reviewed, questions  answered.  She is agreeable. Please see the documentation of medical  records.   MONITORING:  O2 saturation, blood pressure, pulse and respirations were  monitored throughout the entire procedure.   CONSCIOUS SEDATION:  Versed 4 mg IV, Demerol 75 mg IV in divided doses.   INSTRUMENT:  Olympus videochip pediatric colonoscope.   FINDINGS:  Digital rectal exam revealed no abnormalities.   ENDOSCOPIC FINDINGS:  The prep was good.   RECTUM:  Examination of the rectal mucosa including retroflexed view of the  anal verge revealed only internal hemorrhoids.   COLON:  The colonic mucosa was surveyed from the rectosigmoid junction  through the left transverse right colon to the area of the appendiceal  orifice, ileocecal valve and cecum. The terminal ileum was intubated to 10  cm. The ileocecal valve and appendiceal orifice were well seen and  photographed.  From this level, the scope was slowly withdrawn and all  previously mentioned mucosal surfaces were again seen.  There was a 20 cm  segment of abnormal  mucosa spanning the splenic flexure with marked  friability and erosions, loss of the normal vascular pattern.  Distal to  this area, the patient was noted to have only sigmoid diverticulum.  The  remainder of the colonic mucosa appeared normal.  Up stream of the splenic  flexure, the colonic mucosa appeared normal as did the terminal ileum.  Biopsies of the abnormal mucosa were taken for histologic study.  The  patient tolerated the procedure well and was reacted in endoscopy.   IMPRESSION:  1. Internal hemorrhoids otherwise normal rectum.  2. Left sided diverticula.  3. Inflammatory changes involving a 20 cm segment of colon spanning the     splenic flexure most consistent with ischemic or segmental colitis     biopsied. The more proximal colon and terminal ileum appeared normal.   Today's findings explains the patient's presentation.  Ischemic or segmental  colitis is almost always a self limiting condition.   RECOMMENDATIONS:  1. Advance diet as tolerated.  2. No further GI workup as long as she does not have recurrent symptoms.  I     did attempt to call her daughter at the patient's request, Ms. Comer, at     [phone], it was a cell phone and she did not answer.      ___________________________________________                                            Jonathon Bellows, M.D.   RMR/MEDQ  D:  02/10/2004  T:  02/10/2004  Job:  [id number]   cc:   Patrica Duel, M.D.  [address]  [place]  [place] [address]  Fax: [phone]

## 2011-05-13 NOTE — H&P (Signed)
NAME:  Vickie [name], Vickie [name]              ACCOUNT NO.:  [id number]   MEDICAL RECORD NO.:  [id number]         PATIENT TYPE:  INP   LOCATION:  A204                          FACILITY:  [facility]   PHYSICIAN:  Madelin Rear. Sherwood Gambler, MD  DATE OF BIRTH:  Nov 29, 1946   DATE OF ADMISSION:  DATE OF DISCHARGE:  LH                                HISTORY & PHYSICAL   CHIEF COMPLAINT:  Chest pain.   HISTORY OF PRESENT ILLNESS:  Patient developed waxing and waning chest pain  without any exertional precipitation over the past three to four days.  She  had no associated cardiopulmonary symptoms, palpitations, or syncope.  She  incidentally mentioned some cough, nonproductive, as well as some ear  stoppage, plugging, but no vertigo or headache.  She has no hematemesis,  hematochezia, or melena.  No fever, rigors or chills.  She denied any  diaphoretic spells.   PAST MEDICAL HISTORY:  Status post cardiac catheterization October 2004  revealing a 50% lesion felt to be medically manageable with no intervention  performed at that time.  Also pertinent for diabetes mellitus, dietary  controlled, and hyperlipidemia.  It should be noted she has been  noncompliant with Crestor advised to be taken.  Status post hysterectomy as  well as orthopedic surgery.   SOCIAL HISTORY:  She is depressed, although not homicidal or suicidal but  she does admit to increasing stress lately over the holiday season due to  family matters.   FAMILY HISTORY:  Positive for cardiac death in a father.  Deceased  paternal/maternal grandparents, unknown causes.  She has one brother with a  melanoma.   SOCIAL HISTORY:  Negative for smoking, alcohol, or drug use.   ALLERGIES:  She is known to be allergic to PENICILLIN and KEFLEX.   REVIEW OF SYSTEMS:  As under HPI, otherwise negative.   PHYSICAL EXAMINATION:  SKIN:  Unremarkable.  HEENT:  No JVD or adenopathy.  NECK:  Supple.  CHEST:  Clear.  CARDIAC:  Regular rate and rhythm without  murmurs, rubs, or gallops.  ABDOMEN:  Soft.  No organomegaly, masses.  EXTREMITIES:  Without clubbing, cyanosis, edema.  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   EKG in the office was sinus bradycardia, within normal limits.  No acute  ischemia or infarction.  Other laboratories are pending at the present time.   IMPRESSION:  1.  Chest pain, question etiology.  High risk cardiac patient.  She will be      admitted for presumed unstable angina.  Antiplatelet regimen will be      maintained and empiric nitroglycerin will be started.  Cardiology will      be consulted as well.  2.  Diabetes mellitus, dietary controlled.  Monitor.  Intervene as      indicated.  3.  Hyperlipidemia.  Noncompliant with regimen.  Will reassess her lipid      profile in a fasting state.     Lawr   LJF/MEDQ  D:  12/23/2004  T:  12/23/2004  Job:  [id number]

## 2011-05-13 NOTE — Discharge Summary (Signed)
NAME:  Vickie [name], Vickie [name] NO.:  [id number]   MEDICAL RECORD NO.:  [id number]          PATIENT TYPE:  INP   LOCATION:  2032                         FACILITY:  [facility]   PHYSICIAN:  Cristy Hilts. Jacinto Halim, MD       DATE OF BIRTH:  Aug 06, 1946   DATE OF ADMISSION:  10/27/2006  DATE OF DISCHARGE:  10/28/2006                                 DISCHARGE SUMMARY   DISCHARGE DIAGNOSES:  1. Chest pain, myocardial infarction ruled out.  2. Nonobstructive coronary disease with right coronary artery dissection      July 2007.  3. Dyslipidemia.  4. Treated hypertension.  5. History of irritable bowel syndrome.   HOSPITAL COURSE:  The patient is a 65 year old female who is followed by Dr.  Domingo Sep.  She had nonobstructive coronary disease July 2007, that  catheterization was complicated by dissection of the RCA.  The patient was  transferred to [facility] from [facility] ER after she presented there with chest  pain.  CK-MB and troponins were negative.  She was put on heparin and  nitroglycerin initially at [facility] and this was discontinued.  Dr. Jacinto Halim  feels she can be discharged October 28, 2006 after she is ambulated if she  does not have recurrent pain.   DISCHARGE MEDICATIONS:  1. TriCor 145 mg a day.  2. Prilosec 20 mg a day.  3. Gabapentin 400 mg three times a day.  4. Lexapro 20 mg a day.  5. Crestor 10 mg a day.  6. Aspirin 81 mg a day.  7. Metoprolol 12.5 mg twice a day.  8. Plavix 75 mg a day.  9. Imdur 30 mg one to two tablets a day as taken at home.  10.Lisinopril 2.5 mg a day.  11.Remeron 30 mg h.s.  12.Premarin 1.25 mg a day.   LABORATORIES:  Portable chest x-ray shows no acute disease, PA and lateral  was done prior to discharge and these results are pending.  INR is 1.0.  D-  dimer was less than 0.22.  TSH is 3.9.  Troponins and CKs are negative x2.  Lipid panel shows a cholesterol 159, LDL 50, HDL 58.  Liver functions are  normal.  Sodium 138, potassium 4.1, BUN 9,  creatinine 1.1, hemoglobin 11.3,  hematocrit 33.6, platelets 179, white count 6.4.  EKG reveals sinus rhythm  without acute changes.   DISPOSITION:  The patient discharged in stable condition.  Dr. Jacinto Halim would  like her to be set up for a Cardiolite study early next week.  She will  follow up Dr. Domingo Sep.      Abelino Derrick, P.A.      Cristy Hilts. Jacinto Halim, MD  Electronically Signed    LKK/MEDQ  D:  10/28/2006  T:  10/29/2006  Job:  [id number]
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
## 2011-05-13 NOTE — Discharge Summary (Signed)
NAME:  Vickie [name], Vickie [name]              ACCOUNT NO.:  [id number]   MEDICAL RECORD NO.:  [id number]          PATIENT TYPE:  OUT   LOCATION:  RESP                          FACILITY:  [facility]   PHYSICIAN:  Madaline Savage, M.D.DATE OF BIRTH:  10/20/1946   DATE OF ADMISSION:  08/22/2006  DATE OF DISCHARGE:  08/22/2006                                 DISCHARGE SUMMARY   DISCHARGE DIAGNOSES:  1. Chest pain with known coronary artery disease.  2. Coronary disease with right coronary artery dissection that is      stabilized.  3. Acute coronary syndrome without MI, secondary to dissection.  4. Diabetes.  5. Hypertension.  6. Hypotension during hospitalization along with orthostatic hypotension.  7. Normal left ventricular function.  8. Proximal left anterior descending artery stenosis, 40% to 50%.   DISCHARGE PROCEDURES:  1. At [facility], prior to admission to [facility], patient      underwent right and left heart cath with resection of the RCA and then      brought to the cardiac cath lab at [facility] for emergent procedure.  2. July 11, 2006:  Left heart cath with angiogram by Dr. Elsie Lincoln at [facility],      emergently.   DISCHARGE MEDICATIONS:  1. Plavix 75 mg daily.  2. Prilosec 40 mg daily.  3. Fosamax, Reglan as before.  4. Remeron 30 mg 1 every evening.  5. Crestor 10 mg every evening.  6. Tricor 72.5 1 every evening.  7. Remeron 1.25 daily.  8. Isosorbide mononitrate 30 mg 1/2 tablet daily.  9. Zestril 2.5 daily.  10.Aspirin 325 daily.  11.Neurontin 400 mg 3 times a day.  12.Xanax 0.25 as needed.  13.Lexapro 10 mg daily.  14.Calcium carbonate 1200 mg twice a day.  15.Toprol XL 25 1/2 tablet twice a day.  16.Nitroglycerin sublingual p.r.n. chest pain.  17.Vitamins as before.   DISCHARGE INSTRUCTIONS:  1. Low-fat, low-salt diet.  2. Increase activity slowly.  3. No driving for 1 week until you this physician.  4. Wash your outgoing cath site with soap and water.  Call if  any      bleeding, swelling, or drainage.  5. Okay to walk.  6. No lifting over 10 pounds until you see Dr. Domingo Sep.  7. Okay to shower in cool showers.  8. Follow up with Dr. Domingo Sep, August 24, 2006, as previously instructed.   HISTORY OF PRESENT ILLNESS:  A 65 year old female patient of Dr. Roque Lias  with a history of diet-controlled diabetes mellitus type 2, reflux disease,  osteoarthritis, osteoporosis, irritable bowel syndrome, hyperlipidemia,  known coronary disease.  Her previous cath was in January 2006, which  revealed a large left main without significant disease, LAD with 40%  stenosis, and 40% bridging stenosis in the mid vessel, first diagonal and  the iliums without significant disease.  RCA was small with a mild 40%  proximal stenosis.  EF was 60%.  She was given Norvasc for Prinzmetal  angina, did not tolerate secondary to flushing and rash and has since been  on Imdur.  She has multiple statin intolerances.  She has been rechallenged  with Crestor and Tricor prior to the admission.   In June 2007, she had complaint of chest discomfort.  Persantine Myoview  revealed no evidence of ischemia, EF of 78%.  She continued with episodic  chest discomfort that did not change significantly with nitroglycerin.  She  was on a proton pump inhibitor.  Dr. Domingo Sep discussed options such as  cardiac catheterization and the patient was agreeable to proceeding.  She  was given risk factors for cardiac cath and then presented for outpatient  cath with Dr. Elsie Lincoln.   During the procedure, she tolerated the left coronary angiography without  problems, but the RCA was entered with a 4-French right Judkins' diagnostic  catheter, which deep-throated the vessel resulting in acute coronary  dissection.  Vessel was 1.75 to 2 mm in diameter and was nondominant.  She  was started on IV morphine bolus for chest pain, given IV heparin, IV  nitroglycerin with narrow resolution of her chest  pain.  She had some  transient ST depression and hypertension, which resolved.  She was then  transferred by EMS to the [facility] cath lab for LV angiogram and aortic root  angio.  Dr. Elsie Lincoln did not recannulize the RCA for fear of worsening  dissection.  EF was 55% to 60%, normal inferior wall motion.  Arteriogram,  RCA patent, first half of the vessel.  She was then admitted to the CCU for  further management.   PAST MEDICAL HISTORY:  As stated in the HPI.   OUTPATIENT MEDICATIONS:  Essentially the same as the discharge meds, but we  did reduce medicines due to hypertension.   FAMILY HISTORY, SOCIAL HISTORY, REVIEW OF SYSTEMS:  See H and P.   PHYSICAL EXAMINATION AT DISCHARGE:  VITAL SIGNS:  Blood pressure 99/55,  pulse 64, respirations 18, temperature 98.2, oxygen saturation on room air  93%.  Orthostatic pressures, lying down, 99/55.  Sitting down, 96/58.  Standing 94/54.  HEART:  S1, S2.  Regular rate and rhythm.  LUNGS:  Clear.  ABDOMEN:  Positive bowel sounds.  EXTREMITIES:  Without edema.   She had no chest pain, no shortness of breath, and no orthostasis, just mild  hypotension.   LABORATORY DATA:  Hemoglobin 13.4, hematocrit 40.7, WBC 7.1, platelets  190,000, these remained stable.  She did drift down to a hemoglobin of 11.2  and a hematocrit of 34.2 at discharge.   Chemistry:  Sodium 138, potassium 3.8, chloride 105, CO2 26, glucose 123,  BUN 6, creatinine 0.8, calcium 9.7.  These remained stable.  CK is 41, 35.  MB 1.2, 1.  Troponin I 0.03 to 0.01.  All negative for MI.   EKGs:  Showed normal sinus rhythm.  Essentially normal EKG.   I do not have a chest x-ray in the chart.   HOSPITAL COURSE:  Vickie [name] was admitted emergently by Dr. Elsie Lincoln for  cardiac cath after dissection of the RCA.  Procedure revealed stability of  the RCA, though it was not recannulated due to probable worsening of the dissection.  The patient did well postprocedure.  She continued with   episodes of chest pain and more lightheadedness and dizziness was noticed  with ambulation.  With orthostatics, she initially was positive.  We  adjusted her medications and she kept improving.  By July 19, 2006 she had  no chest pain, no complaints.  Blood pressure was low but stable and she had  no further dizziness. Dr. Tresa Endo saw her  and felt she was stable and ready to  discharge home and she will follow up with Dr. Domingo Sep as an outpatient.      Darcella Gasman. Annie Paras, N.P.    ______________________________  Madaline Savage, M.D.    LRI/MEDQ  D:  08/24/2006  T:  08/25/2006  Job:  [id number]   cc:   Madaline Savage, M.D.  Dani Gobble, MD  Timoteo Expose, M.D.

## 2011-05-13 NOTE — H&P (Signed)
NAME:  Vickie [name], Vickie [name]                        ACCOUNT NO.:  [id number]   MEDICAL RECORD NO.:  [id number]                   PATIENT TYPE:  INP   LOCATION:  A212                                 FACILITY:  [facility]   PHYSICIAN:  Patrica Duel, M.D.                 DATE OF BIRTH:  09/30/1946   DATE OF ADMISSION:  02/08/2004  DATE OF DISCHARGE:                                HISTORY & PHYSICAL   CHIEF COMPLAINT:  Rectal bleeding.   HISTORY OF PRESENT ILLNESS:  This is a 65 year old female with a history of  diabetes mellitus type 2, osteoarthritis, ASCVD (poorly documented), status  post knee surgery.  She also has osteoporosis and is on biphosphonate  therapy.   The patient developed acute diarrhea on February 07, 2004.  She began to  notice her stools were somewhat bloody and she presented to the emergency  department for evaluation.  She had had no nausea, vomiting, or hematemesis.  In the emergency department the patient was found to be mildly orthostatic  but with a normal hemoglobin and hematocrit.  She did have gross blood in  the rectal vault.  She is admitted for evaluation of acute lower GI bleeding  of questionable etiology.  Of note, the patient has had colonoscopy in the  past by Dr. Lovell Sheehan, time unknown.   There is no history of headache, neurologic deficits, chest pain, shortness  of breath, or genitourinary symptoms.  She does complain of some pain in the  right hip and inguinal region after doing leg lifts at home approximately 2  weeks ago.  She does not have any true radicular symptoms.   CURRENT MEDICATIONS:  Altace, Celebrex p.r.n., Premarin, Fosamax, and  Nexium.   ALLERGIES:  PENICILLIN.   PAST MEDICAL HISTORY:  As noted above.   FAMILY HISTORY:  Noncontributory.   REVIEW OF SYSTEMS:  Negative except for mentioned.   SOCIAL HISTORY:  Nonsmoker, nondrinker.   PHYSICAL EXAMINATION:  GENERAL:  A very pleasant, fully-alert female in no  acute distress.  VITAL SIGNS:  Blood pressure 116 systolic lying, dropping to 95 systolic  standing; heart rate of 83 and 103.  She is afebrile.  Heart rate in the 80s  and 90s.  HEENT:  Normocephalic, atraumatic.  There is no scleral icterus.  Mucous  membranes are moist.  NECK:  Supple, no bruits.  LUNGS:  Clear to A&P.  HEART:  Heart sounds are somewhat distant.  No murmurs, rubs, or gallops.  ABDOMEN:  Essentially nontender, nondistended, bowel sounds are intact.  EXTREMITIES:  No clubbing or cyanosis.  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  NEUROLOGIC:  Nonfocal.   PERTINENT LABORATORY DATA:  As noted.  Hemoglobin this morning is 13.5.   ASSESSMENT:  Rectal bleeding associated with diarrheal illness, possible  acute colitis versus other.  She is in need of full workup.  Plan to consult  GI for colonoscopy.  Of note, the patient has had no recurrent diarrhea or  rectal bleeding since being admitted.     ___________________________________________                                         Patrica Duel, M.D.   MC/MEDQ  D:  02/09/2004  T:  02/09/2004  Job:  [id number]

## 2011-05-13 NOTE — H&P (Signed)
NAME:  Vickie [name], Vickie [name]              ACCOUNT NO.:  [id number]   MEDICAL RECORD NO.:  [id number]          PATIENT TYPE:  INP   LOCATION:  A223                          FACILITY:  [facility]   PHYSICIAN:  Kirk Ruths, M.D.DATE OF BIRTH:  March 04, 1946   DATE OF ADMISSION:  01/22/2007  DATE OF DISCHARGE:  LH                              HISTORY & PHYSICAL   CHIEF COMPLAINT:  Substernal tightness in her chest.   HISTORY OF PRESENT ILLNESS:  This is a 65 year old female that has  recently undergone cardiac catheterization and has what is called  nonobstructive coronary artery disease.  She also is noted to have  normal ejection fraction on echocardiogram.  She is followed by Dr.  Domingo Sep for her heart.  On this occasion, patient noted some substernal  tightness, some expiratory wheezes relieved after a third nitroglycerin.  Patient was seen in the emergency room.  Initial cardiac enzymes were  negative.  Vital signs were normal.  She is admitted for rule out MI.   ALLERGIES:  PENICILLIN.   MEDICATIONS:  1. Neurontin 400 mg daily.  2. Lexapro 20 daily.  3. Crestor 10 daily.  4. Aspirin 81 daily.  5. Metoprolol 25 mg daily.  6. Nitroglycerin p.r.n.  7. Plavix 75 daily.  8. Isorbid 30 daily.  9. Lisinopril 2.5 daily.  10.Mirtazapine 30 mg daily.  11.Fosamax 70 once a week.  12.Nexium 40 daily.   PAST MEDICAL HISTORY:  1. Diet controlled diabetes.  2. Hyperlipidemia.  3. Hypertension.   SOCIAL HISTORY:  Nonsmoker, nondrinker, no drug abuse.   PHYSICAL EXAMINATION:  GENERAL APPEARANCE:  An elderly female in no  severe distress.  VITAL SIGNS:  Blood pressure 110/60, heart rate 60 and regular,  respirations 18 and unlabored, she is afebrile.  HEENT:  TMs normal.  Pupils are equal, round, reactive to light and  accommodation.  Oropharynx benign.  NECK:  Supple without JVD, bruit or thyromegaly.  LUNGS:  Clear in all areas.  CARDIOVASCULAR:  Regular sinus rhythm without murmurs,  rubs, or gallops.  ABDOMEN:  Soft and nontender.  EXTREMITIES:  No clubbing, cyanosis, or edema.  NEUROLOGIC:  Grossly intact.   ASSESSMENT:  1. Chest pain, rule out myocardial infarction.  2. History of hypertension.  3. History of nonobstructive coronary artery disease.      Kirk Ruths, M.D.  Electronically Signed     WMM/MEDQ  D:  01/23/2007  T:  01/23/2007  Job:  [id number]

## 2011-05-13 NOTE — Discharge Summary (Signed)
NAME:  Vickie [name], Vickie [name] NO.:  [id number]   MEDICAL RECORD NO.:  [id number]          PATIENT TYPE:  INP   LOCATION:  2016                         FACILITY:  [facility]   PHYSICIAN:  Thereasa Solo. Little, M.D. DATE OF BIRTH:  Jun 21, 1946   DATE OF ADMISSION:  07/14/2008  DATE OF DISCHARGE:  07/16/2008                               DISCHARGE SUMMARY   Vickie [name] is a 65 year old female patient who was brought to [facility] after seeing Dr. Sherwood Gambler with complaints of chest pain.  She does have  a history of coronary artery disease so it was decided that she should  be admitted and ruled out for MI.  Previously, she had had a cath and  she had some mild disease 50% in her LAD.  She does have chronic reflux  and she is on a PPI for that.  She had some moderate right upper  quadrant tenderness.  It was decided that she should undergo cardiac  catheterization.  This was performed by Dr. Julieanne Manson.  She did have  an ostial 80% septal perforator; however, this was not an amenable PCI  vessel because of the size and the treatment was only suggested.  In her  LAD, she only had minimal proximal irregularities.  The circumflex was  okay and RCA also was okay and was nondominant.  She had hyperdynamic  function at 75%.  Dr. Clarene Duke decided to start Toprol.  Because of her  hyperdynamic function, he suggested avoiding any nitroglycerin or  calcium channel blockers except for verapamil and Cardizem.  On July 15, 2008, she was felt to be ready to be discharged home after an abdominal  ultrasound that was normal and the reading came back as unremarkable,  however, when it came time to discharge her, the daughter was present  and did not want her discharged until she was seen by GI.  A GI consult  was called.  She had previously seen Dr. Juanda Chance.  It was felt by [facility] that her chest pain was not GI related and that her  GERD symptoms were controlled and no further workup  was planned.  On  July 16, 2008, she was ready to be discharged home.  Her metoprolol was  changed to 12.5 b.i.d. because of price.   DISCHARGE MEDICATIONS:  1. Lexapro 20 mg daily.  2. Plavix 75 mg a day.  3. Neurontin 400 mg twice a day.  4. Premarin 1.25 mg a day.  5. Prilosec 20 mg a day.  6. Trilipix 135 mg a day.  7. Zetia 10 mg at bedtime.  8. Crestor 10 mg a day.  9. Remeron half a tablet at bedtime.  10.Aspirin 81 mg a day.  11.Xanax 0.5 mg a day.  12.She should decrease her metoprolol to half of 25 mg tablet twice a      day.  13.Zyrtec 10 mg a day.  14.Fish oil twice a day.  15.Nitroglycerin p.r.n.   LABORATORY DATA:  Hemoglobin 12.5, hematocrit 38.2, WBC 6.9, platelets  169.  Sodium 137, potassium 3.7, BUN 8, creatinine  0.74, glucose 130,  chloride 102, CO2 29, total protein was 5.8, albumin was 3.4, CK-MB and  troponins were negative x4, total cholesterol is 163, triglycerides 180,  HDL 63, LDL was 64.  TSH was 2.526.  Abdominal ultrasound showed no  lesions, benign-appearing cyst of the liver.  Gallbladder was normal  without stones, sludge or wall thickening.   She will follow up with Dr. Domingo Sep in approximately 2 weeks.  She  should call for an appointment and follow up with Dr. Sherwood Gambler as usual.  She was told not to do any strenuous activity, lifting, pushing, or  pulling for a week.   DISCHARGE DIAGNOSES:  1. Chest pain thought to be musculoskeletal.  2. Coronary artery disease with only an ostial septal perforator which      is not amenable to percutaneous coronary intervention.  3. Hyperdynamic left ventricular function, ejection fraction of 75%.      She should not have any nitroglycerin or vasodilators.  4. History of orthostatic hypotension.  5. Bradycardia, heart rate down to 33.  6. History of gastroesophageal reflux disease controlled on current      medications.  7. Hyperlipidemia.  8. Irritable bowel syndrome.  9. Possible obstructive sleep  apnea.      Lezlie Octave, N.P.    ______________________________  Thereasa Solo. Little, M.D.    BB/MEDQ  D:  08/12/2008  T:  08/13/2008  Job:  [id number]   cc:   Madelin Rear. Sherwood Gambler, MD

## 2011-05-13 NOTE — Procedures (Signed)
NAME:  Vickie [name], Vickie [name]              ACCOUNT NO.:  [id number]   MEDICAL RECORD NO.:  [id number]          PATIENT TYPE:  INP   LOCATION:  A223                          FACILITY:  [facility]   PHYSICIAN:  Edward L. Juanetta Gosling, M.D.DATE OF BIRTH:  03-01-1946   DATE OF PROCEDURE:  DATE OF DISCHARGE:  01/23/2007                            PULMONARY FUNCTION TEST   __________   1. Spirometry shows a mild ventilatory defect with evidence of airflow      obstruction.  2. Lung volumes show normal.  3. DLCO is normal.   Dictation Ended At This [employer] L. Juanetta Gosling, M.D.  Electronically Signed     ELH/MEDQ  D:  02/05/2007  T:  02/05/2007  Job:  [id number]

## 2011-05-13 NOTE — Cardiovascular Report (Signed)
NAME:  Vickie [name], Vickie [name]                        ACCOUNT NO.:  [id number]   MEDICAL RECORD NO.:  [id number]                   PATIENT TYPE:  OIB   LOCATION:  4735                                 FACILITY:  [facility]   PHYSICIAN:  Cristy Hilts. Jacinto Halim, M.D.                  DATE OF BIRTH:  14-May-1946   DATE OF PROCEDURE:  10/10/2003  DATE OF DISCHARGE:  10/11/2003                              CARDIAC CATHETERIZATION   PROCEDURES PERFORMED:  1. Left ventriculography.  2. Selective left and right coronary arteriography.   INDICATIONS:  Vickie [name] is a 65 year old Caucasian female with a history of  diet-controlled diabetes, osteoarthritis, hyperlipidemia, who was referred  for evaluation of chest pain.  She had stress test which revealed ST-T wave  changes suggestive of ischemia and also reversible anterior wall ischemia by  Cardiolite.  She was brought to the cardiac catheterization laboratory to  evaluate her coronary anatomy.   HEMODYNAMIC DATA:  1. Left ventricular pressure 137/6 with end-diastolic pressure of 11 mmHg.  2. Aortic pressure 143/81 with a mean of 108 mmHg.  There was no pressure     gradient across the aortic valve.   ANGIOGRAPHIC DATA:  1. Left ventricle:  Left ventricular systolic function was normal and     ejection fraction estimated at 60%.  2. Right coronary artery:  The right coronary artery is a large-caliber     vessel.  It is normal.  3. Left main coronary artery:  The left main coronary artery is a large-     caliber vessel.  It  is normal.  4. Left anterior descending:  The left anterior descending artery is large-     caliber vessel.  The proximal segment has eccentric 40% plaque.  It gives     origin to a small diagonal 1.  The LAD in the distal segment has     intramyocardial bridging with minimal luminal irregularity.  5. Circumflex:  The circumflex coronary artery is a large-caliber vessel.     It gives origin to a moderate to large-caliber obtuse marginal.   It is     normal.  6. Abdominal aortogram:  Abdominal aortogram shows there are two renal     arteries, one on either side.  They were normal.   IMPRESSION:  1. Normal left ventricular systolic function, ejection fraction 60%.  2. Ostial eccentric 40% stenosis of the proximal and ostial left anterior     descending coronary artery with heavy plaque burden.  Distal left     anterior descending has mild intramyocardial bridging with mild luminal     irregularity.  3. Normal right coronary artery.  4. Positive Cardiolite stress test at the apex secondary to myocardial     bridging.   RECOMMENDATIONS:  Very aggressive lipid management and diabetes management  is indicated.  The patient can be followed up by serial Cardiolite for  evaluation of proximal  LAD lesion.   TECHNIQUE OF THE PROCEDURE:  Under the usual sterile precautions, using 6  French right femoral arterial access, a 6-French multipurpose B2 catheter  was advanced into ascending aorta over 0.035 J-wire.  The catheter was  gently advanced to the left ventricle and left ventricular pressure  monitored.  Hand contrast injection of left ventricle was performed in both  in the LAO and RAO projections.  The catheter was flushed, pulled back into  the ascending aorta, and pressure gradient of across the aortic valve was  monitored.  The right coronary was selectively engaged and angiography was  performed.  Then, in a similar fashion, the left main coronary artery was  selectively engaged and angiography was performed.  Then the catheter was  pulled back into the body in the usual fashion.   Right femoral angiography was performed through the arterial access sheath  and the access was attempted to close with Perclose but because of inability  to obtain adequate hemostasis, manual pressure was held and the patient was  transferred to the recovery area, where she remained in a stable condition.  The patient tolerated the procedure  well.  No immediate complications noted.                                               Cristy Hilts. Jacinto Halim, M.D.    Pilar Plate  D:  03/18/2004  T:  03/19/2004  Job:  [id number]   cc:   Kirk Ruths, M.D.  [address]  [place]  [place] [address]  Fax: [phone]

## 2011-05-13 NOTE — H&P (Signed)
[facility]  Patient:    Vickie [name]                      MRN: [id number] Adm. Date:  [id number] Attending:  Ivor Messier CC:         Guadelupe Sabin, M.D.                         History and Physical  REASON FOR ADMISSION:  This was a planned outpatient surgical readmission of this white female, birth date 03/18/1946,  admitted for macular retinal hole surgery of the right eye.  HISTORY OF PRESENT ILLNESS:  This patient was first seen in my office on November 04, 1999, with a history of decreased vision in the right eye for at least one to two months duration.  When the patient had checked her own vision by covering the left eye, she noticed she could not see the car in front of her while driving.  She had also noted that she required larger print to see figures up close.  She was seen by Dr. Jethro Bolus, her regular ophthalmologist, and then  referred to me for further retinal evaluation.  PAST MEDICAL HISTORY:  The patient resides in [place], [place], is under the primary care physician, Dr. Regino Schultze.  The patient is said to be in stable general health with chronic arthritis.  CURRENT MEDICATIONS:  Premarin, Elavil, and Naprosyn  REVIEW OF SYSTEMS:  No cardiorespiratory complaints.  ALLERGIES:  Possible PENICILLIN allergy.  PHYSICAL EXAMINATION:  HEENT:  Eyes: Visual acuity less than 20/400 right eye, 20/25 left eye. Applanation tonometry 14 mm each eye.  Slit lamp examination revealed extensive  lateral trichiasis of the upper lids of both eyes.  Fundus Examination: The right eye revealed a clear vitreous, attached retina, and normal optic nerve and blood vessels.  The macula area, however, appeared to show a classic stage IV macular  hole with surrounding retinal detachment.  In the middle of the base of the hole were several yellow-white dots.  It was felt that the patient had a macular hole  of at least 2 months duration, perhaps longer.  The patient was given oral discussion and printed information concerning macular hole surgery and the attendant complications and possibility of limited improvement in vision.  On considering  this, however, she elected to proceed with surgery, and this was authorized by er [employer].  Arrangements were made for her outpatient admission at this time.  ADMISSION DIAGNOSIS:  Retinal macular hole, right eye.  SURGICAL PLAN:  Posterior vitrectomy through pars plana using vitreous infusion  suction cutter with membrane stripping and peeling. DD:  01/07/00 TD:  01/07/00 Job: [id number]

## 2011-05-13 NOTE — Consult Note (Signed)
NAME:  Vickie [name], Vickie [name] NO.:  [id number]   MEDICAL RECORD NO.:  [id number]          PATIENT TYPE:  INP   LOCATION:  2032                         FACILITY:  [facility]   PHYSICIAN:  Hedwig Morton. Juanda Chance, MD     DATE OF BIRTH:  1946-08-10   DATE OF CONSULTATION:  DATE OF DISCHARGE:                                   CONSULTATION   NAME OF THE PROCEDURE:  Upper endoscopy.   INDICATIONS:  This 65 year old white female with known coronary artery  disease has been experiencing substernal chest pain, which has initially  been exertional, but also has been associated with dysphagia, and pill  dysphagia, and odynophagia.  She has a known history of gastroesophageal  reflux.  Cardiac causes of the chest pain has been ruled out during this  admission.  She is undergoing upper endoscopy to further evaluate her  esophageal disease.   ENDOSCOPE:  Olympus endoscope.   SEDATION:  Versed 5 mg IV, fentanyl 50 mcg IV.   FINDINGS:  Olympus endoscope passed in the routine direct fashion through  posterior pharynx, into esophagus.  Patient was monitored by pulse oximeter.  Her oxygen saturations were normal.  Proximal and distal esophageal mucosa  was unremarkable.  There was no evidence of esophagitis.  Squamocolumnar  junction was normal.  Peristaltic waves were present.  There were no  abnormal pulsations and there was no resistance in passing into the stomach.  No definite stricture was seen.  There was no hiatal hernia.   STOMACH:  Stomach was insufflated with air.  Again, no hiatal hernia was  found.  There was several long linear erosions in the mid stomach, which  were conversed into the antrum.  These were consistent with mild gastritis.  Biopsies were taken for CLOtest.  Gastric antrum and pylorus showed patches  of erythema, but this were quite mild and nonspecific.  Retroflexion of  endoscope revealed normal sinus and cardia.   DUODENUM:  Descending duodenum was normal.  Endoscope  was then brought back  into the stomach.  Stomach was decompressed.  Patient tolerated the  procedure well.   IMPRESSION:  1. Essentially normal upper endoscopy of the esophagus with biopsy of GE      junction.  2. Minimal mid-body of the stomach gastritis, status post CLOtest.  3. No evidence of esophageal stricture.   PLAN:  The findings in the upper endoscopy do not account for patient's  symptoms, therefore I would like to go ahead and rule out esophageal  dysmotility and esophageal spasm with cine-esophagram.  Patient will undergo  barium swallow with a cine in the morning.  In the meantime, she will  continue on the antireflux measures and proton-pump inhibitors.      Hedwig Morton. Juanda Chance, MD  Electronically Signed     DMB/MEDQ  D:  10/31/2006  T:  11/01/2006  Job:  [id number]

## 2011-05-13 NOTE — Procedures (Signed)
NAME:  Vickie [name], Vickie [name]              ACCOUNT NO.:  [id number]   MEDICAL RECORD NO.:  [id number]          PATIENT TYPE:  INP   LOCATION:  A204                          FACILITY:  [facility]   PHYSICIAN:  Dani Gobble, MD       DATE OF BIRTH:  1946/11/12   DATE OF PROCEDURE:  12/24/2004  DATE OF DISCHARGE:                                  ECHOCARDIOGRAM   REFERRING PHYSICIAN:  Dr. Sherwood Gambler and Dr. Domingo Sep.   INDICATIONS:  [name] is a very pleasant 65 year old female with a past  medical history of diabetes mellitus, known CAD, and GERD who is admitted  with chest pain and is referred for echocardiogram.   Technical quality of the study is quite limited secondary to patient's body  habitus and poor acoustic windows.   No measurements were able to be made on this study. There will be a limited  evaluation.   The aorta was not well visualized.   The interventricular septum and posterior wall were unable to be measured  but appeared to be grossly normal to borderline hypertrophied.   The left atrium was not well visualized but appeared to be grossly normal in  size.   The aortic valve was not well visualized.   The mitral valve also was poorly visualized but grossly structurally normal.   The tricuspid valve appeared grossly structurally normal with mild tricuspid  regurgitation noted.   The pulmonic valve was not visualized. The left ventricle grossly appeared  normal in size with grossly normal left ventricular systolic function. All  walls were not well visualized, but having said that, no regional wall  motion abnormalities were appreciated. Primarily, the views were from the  apical and subcostal region only. Overall ejection fraction is normal. Right  ventricle appears normal in size with probably normal right ventricular  systolic function as well.   IMPRESSION:  1.  Suboptimal study secondary to patient body habitus and poor acoustic      windows.  2.  Left ventricular  size and systolic function appears normal, and although      the endocardium of all of the walls was not visualized, those that were      seen exhibited no abnormality. Overall injection fracture was normal.  3.  Mild tricuspid regurgitation.      AB/MEDQ  D:  12/24/2004  T:  12/24/2004  Job:  [id number]

## 2011-09-20 DIAGNOSIS — G4733 Obstructive sleep apnea (adult) (pediatric): Secondary | ICD-10-CM

## 2011-09-20 HISTORY — DX: Obstructive sleep apnea (adult) (pediatric): G47.33

## 2011-09-23 LAB — BASIC METABOLIC PANEL
CO2: 29
Calcium: 9.5
Creatinine, Ser: 0.74
GFR calc Af Amer: >60
GFR calc non Af Amer: >60
Glucose, Bld: 130 — ABNORMAL HIGH
Sodium: 137

## 2011-09-23 LAB — LIPID PANEL
Cholesterol: 163
HDL: 63
Total CHOL/HDL Ratio: 2.6
VLDL: 36

## 2011-09-23 LAB — CBC
HCT: 39.6
Hemoglobin: 12.5
Hemoglobin: 13.7
MCHC: 32.7
MCHC: 34.5
MCV: 90.4
MCV: 92
RBC: 4.16
RBC: 4.39
RDW: 12.9

## 2011-09-23 LAB — MAGNESIUM: Magnesium: 1.9

## 2011-09-23 LAB — CARDIAC PANEL(CRET KIN+CKTOT+MB+TROPI)
CK, MB: 1.2
Relative Index: INVALID
Relative Index: INVALID
Total CK: 27
Total CK: 29
Total CK: 31
Troponin I: 0.01

## 2011-09-23 LAB — COMPREHENSIVE METABOLIC PANEL
ALT: 16
AST: 16
Alkaline Phosphatase: 38 — ABNORMAL LOW
CO2: 28
Chloride: 106
GFR calc non Af Amer: >60
Glucose, Bld: 86
Potassium: 4.1
Sodium: 140
Total Bilirubin: 0.6

## 2011-09-23 LAB — DIFFERENTIAL
Basophils Relative: 1
Eosinophils Absolute: 0.1
Eosinophils Relative: 2
Neutrophils Relative %: 60

## 2011-09-23 LAB — PROTIME-INR: Prothrombin Time: 12.6

## 2011-09-23 LAB — CK TOTAL AND CKMB (NOT AT ARMC): Relative Index: INVALID

## 2011-09-29 ENCOUNTER — Other Ambulatory Visit (HOSPITAL_COMMUNITY): Payer: Self-pay | Admitting: Family Medicine

## 2011-09-29 DIAGNOSIS — Z139 Encounter for screening, unspecified: Secondary | ICD-10-CM

## 2011-10-04 LAB — BLOOD GAS, ARTERIAL
Acid-Base Excess: 0.9
Bicarbonate: 25.5 — ABNORMAL HIGH
O2 Saturation: 95
Patient temperature: 37
TCO2: 23
pO2, Arterial: 75.7 — ABNORMAL LOW

## 2011-10-07 LAB — DIFFERENTIAL
Basophils Absolute: 0
Eosinophils Absolute: 0.1
Eosinophils Relative: 2
Lymphocytes Relative: 27
Monocytes Absolute: 0.5

## 2011-10-07 LAB — BASIC METABOLIC PANEL
BUN: 14
CO2: 24
Calcium: 8.8
Chloride: 99
Creatinine, Ser: 1.01
GFR calc non Af Amer: 56 — ABNORMAL LOW
Glucose, Bld: 85

## 2011-10-07 LAB — POCT CARDIAC MARKERS
CKMB, poc: <1 — ABNORMAL LOW
Operator id: 179121
Operator id: 179121
Troponin i, poc: <0.05
Troponin i, poc: <0.05
Troponin i, poc: <0.05

## 2011-10-07 LAB — CBC
HCT: 37.6
Hemoglobin: 12.7
MCV: 90.3
Platelets: 179
RDW: 12.9

## 2011-10-24 ENCOUNTER — Ambulatory Visit (HOSPITAL_COMMUNITY)
Admission: RE | Admit: 2011-10-24 | Discharge: 2011-10-24 | Disposition: A | Discharge status: Home / Self Care | Payer: Medicare Other | Source: Ambulatory Visit | Attending: Family Medicine | Admitting: Family Medicine

## 2011-10-24 DIAGNOSIS — Z139 Encounter for screening, unspecified: Secondary | ICD-10-CM

## 2011-10-24 DIAGNOSIS — Z1231 Encounter for screening mammogram for malignant neoplasm of breast: Secondary | ICD-10-CM | POA: Insufficient documentation

## 2011-10-31 ENCOUNTER — Other Ambulatory Visit: Payer: Self-pay | Admitting: Family Medicine

## 2011-10-31 DIAGNOSIS — R928 Other abnormal and inconclusive findings on diagnostic imaging of breast: Secondary | ICD-10-CM

## 2011-11-23 ENCOUNTER — Ambulatory Visit (HOSPITAL_COMMUNITY)
Admission: RE | Admit: 2011-11-23 | Discharge: 2011-11-23 | Disposition: A | Discharge status: Home / Self Care | Payer: Medicare Other | Source: Ambulatory Visit | Attending: Family Medicine | Admitting: Family Medicine

## 2011-11-23 DIAGNOSIS — R928 Other abnormal and inconclusive findings on diagnostic imaging of breast: Secondary | ICD-10-CM

## 2012-07-18 ENCOUNTER — Encounter (HOSPITAL_COMMUNITY): Payer: Self-pay | Admitting: *Deleted

## 2012-07-18 ENCOUNTER — Emergency Department (HOSPITAL_COMMUNITY)
Admission: EM | Admit: 2012-07-18 | Discharge: 2012-07-18 | Disposition: A | Discharge status: Home / Self Care | Payer: Medicare Other | Attending: Emergency Medicine | Admitting: Emergency Medicine

## 2012-07-18 ENCOUNTER — Emergency Department (HOSPITAL_COMMUNITY): Payer: Medicare Other

## 2012-07-18 DIAGNOSIS — I517 Cardiomegaly: Secondary | ICD-10-CM | POA: Insufficient documentation

## 2012-07-18 DIAGNOSIS — R05 Cough: Secondary | ICD-10-CM | POA: Insufficient documentation

## 2012-07-18 DIAGNOSIS — J4489 Other specified chronic obstructive pulmonary disease: Secondary | ICD-10-CM | POA: Insufficient documentation

## 2012-07-18 DIAGNOSIS — J449 Chronic obstructive pulmonary disease, unspecified: Secondary | ICD-10-CM | POA: Insufficient documentation

## 2012-07-18 DIAGNOSIS — R5381 Other malaise: Secondary | ICD-10-CM | POA: Insufficient documentation

## 2012-07-18 DIAGNOSIS — R059 Cough, unspecified: Secondary | ICD-10-CM | POA: Insufficient documentation

## 2012-07-18 DIAGNOSIS — R5383 Other fatigue: Secondary | ICD-10-CM | POA: Insufficient documentation

## 2012-07-18 DIAGNOSIS — R079 Chest pain, unspecified: Secondary | ICD-10-CM | POA: Insufficient documentation

## 2012-07-18 DIAGNOSIS — R0602 Shortness of breath: Secondary | ICD-10-CM | POA: Insufficient documentation

## 2012-07-18 HISTORY — DX: Unspecified osteoarthritis, unspecified site: M19.90

## 2012-07-18 HISTORY — DX: Chronic obstructive pulmonary disease, unspecified: J44.9

## 2012-07-18 LAB — COMPREHENSIVE METABOLIC PANEL
Albumin: 3.8 g/dL (ref 3.5–5.2)
BUN: 13 mg/dL (ref 6–23)
CO2: 29 mEq/L (ref 19–32)
Calcium: 10.4 mg/dL (ref 8.4–10.5)
Chloride: 104 mEq/L (ref 96–112)
Creatinine, Ser: 0.79 mg/dL (ref 0.50–1.10)
GFR calc non Af Amer: 86 mL/min — ABNORMAL LOW (ref >90)
Total Bilirubin: 0.2 mg/dL — ABNORMAL LOW (ref 0.3–1.2)

## 2012-07-18 LAB — CBC WITH DIFFERENTIAL/PLATELET
Basophils Relative: 1 % (ref 0–1)
Eosinophils Relative: 2 % (ref 0–5)
HCT: 40.9 % (ref 36.0–46.0)
Hemoglobin: 13.5 g/dL (ref 12.0–15.0)
MCHC: 33 g/dL (ref 30.0–36.0)
MCV: 86.5 fL (ref 78.0–100.0)
Monocytes Absolute: 0.5 10*3/uL (ref 0.1–1.0)
Monocytes Relative: 9 % (ref 3–12)
Neutro Abs: 3.4 10*3/uL (ref 1.7–7.7)

## 2012-07-18 LAB — TROPONIN I: Troponin I: <0.3 ng/mL (ref <0.30)

## 2012-07-18 MED ORDER — ALPRAZOLAM 0.5 MG PO TABS
0.5000 mg | ORAL_TABLET | Freq: Once | ORAL | Status: AC
  Administered 2012-07-18: 0.5 mg via ORAL
  Filled 2012-07-18: qty 1

## 2012-07-18 NOTE — ED Provider Notes (Signed)
History  This chart was scribed for Benny Lennert, MD by Erskine Emery. This patient was seen in room [facility]06[facility] and the patient's care was started at 15:28.   CSN: [id number]  Arrival date & time 07/18/12  1318   First MD Initiated Contact with Patient 07/18/12 1528      Chief Complaint  Patient presents with  . Chest Pain    (Consider location/radiation/quality/duration/timing/severity/associated sxs/prior Treatment) Vickie [name] is a 66 y.o. female who presents to the Emergency Department. Patient is a 66 y.o. female presenting with chest pain. The history is provided by the patient. No language interpreter was used.  Chest Pain The chest pain began 2 days ago. Chest pain occurs intermittently. The chest pain is unchanged. The pain is associated with coughing. The severity of the pain is moderate. Quality: exploding and fire-like. The pain radiates to the left arm and right arm. Exacerbated by: coughing. Primary symptoms include shortness of breath and cough. Pertinent negatives for primary symptoms include no fever, no nausea and no vomiting.  Associated symptoms include diaphoresis and weakness.  Her past medical history is significant for CAD, COPD and diabetes. Procedure history comments: abdominal hysterectomy.   Pt reports on Monday (2 days ago) she began feeling explosive pain in her chest that radiated to her arms bilaterally with a fire-like feeling, aggravated by coughing. Pt also reports associated symptoms of diaphoresis, SOB, and generalized soreness and weakness. Pt reports the symptoms have decreased significantly since. Pt reports previous episodes of similar symptoms.  Pt reports she saw Dr. Phillips Odor earlier today and was told to come in to the ED. Dr. Alanda Amass is her cardiologist.   Past Medical History  Diagnosis Date  . Coronary artery disease   . COPD (chronic obstructive pulmonary disease)   . Diabetes mellitus   . Arthritis     Past Surgical History    Procedure Date  . Abdominal hysterectomy     History reviewed. No pertinent family history.  History  Substance Use Topics  . Smoking status: Never Smoker   . Smokeless tobacco: Not on file  . Alcohol Use: No    OB History    Grav Para Term Preterm Abortions TAB SAB Ect Mult Living                  Review of Systems  Constitutional: Positive for diaphoresis. Negative for fever and chills.  Respiratory: Positive for cough and shortness of breath.   Cardiovascular: Positive for chest pain.  Gastrointestinal: Negative for nausea and vomiting.  Neurological: Positive for weakness.  All other systems reviewed and are negative.    Allergies  Bactrim; Penicillins; and Sudafed  Home Medications  No current outpatient prescriptions on file.  Triage Vitals: BP 126/79  Pulse 62  Temp 98.9 F (37.2 C) (Oral)  Resp 20  Ht 5' (1.524 m)  Wt 184 lb (83.462 kg)  BMI 35.94 kg/m2  SpO2 97%  Physical Exam  Nursing note and vitals reviewed. Constitutional: She is oriented to person, place, and time. She appears well-developed and well-nourished. No distress.  HENT:  Head: Normocephalic and atraumatic.  Mouth/Throat: Oropharynx is clear and moist.  Eyes: Conjunctivae and EOM are normal. Pupils are equal, round, and reactive to light.  Neck: Neck supple.  Cardiovascular: Normal rate, regular rhythm and normal heart sounds.   No murmur heard. Pulmonary/Chest: Effort normal. No respiratory distress. She exhibits no tenderness.  Abdominal: Soft.  Musculoskeletal: Normal range of motion. She exhibits no edema.  Missing a couple fingers on each hand.   Neurological: She is alert and oriented to person, place, and time. Coordination normal.  Skin: Skin is warm and dry. No rash noted. No erythema.  Psychiatric: She has a normal mood and affect. Her behavior is normal.    ED Course  Procedures (including critical care time) DIAGNOSTIC STUDIES: Oxygen Saturation is 97% on  room air, adequate by my interpretation.    COORDINATION OF CARE: 15:45--I evaluated the patient and we discussed a treatment plan including blood work and imaging to which the pt agreed.   16:00--Medication order: Alprazolam (Xanax) tablet 0.5 mg--once  17:08--I rechecked the pt. I informed her of the results of her exams. I instructed her to come back if it gets worse and to follow up with her cardiologist.   Labs Reviewed - No data to display Dg Chest 2 View  07/18/2012  *RADIOLOGY REPORT*  Clinical Data: Chest pain.  CHEST - 2 VIEW  Comparison: 11/19/2010.  Findings: Cardiomegaly.  No infiltrates or failure.  COPD with hyperinflation.  No effusion or pneumothorax. Degenerative changes thoracic spine with slight reversal of the normal thoracic kyphosis is stable from priors.  Advanced degenerative thoracolumbar disc disease.  Stable compared with priors.  IMPRESSION: No acute cardiopulmonary disease. Cardiomegaly.  COPD.  Original Report Authenticated By: Elsie Stain, M.D.     No diagnosis found.  Date: 07/18/2012  Rate:61  Rhythm:* normal sinus rhythm  QRS Axis: normal  Intervals: normal  ST/T Wave abnormalities: normal  Conduction Disutrbances:none  Narrative Interpretation:   Old EKG Reviewed: none available     MDM            Benny Lennert, MD 07/18/12 1712

## 2012-07-18 NOTE — ED Notes (Signed)
Pt requesting xanax to help her relax and something to drink.  Notified Dr. Estell Harpin and medication ordered.

## 2012-07-18 NOTE — ED Notes (Addendum)
Had pain in chest since Monday and bil arm pain  EMS called on Monday and did not transport.  Seen by Dr Phillips Odor today and sent to ER to be  eval

## 2012-10-15 ENCOUNTER — Other Ambulatory Visit (HOSPITAL_COMMUNITY): Payer: Self-pay | Admitting: Family Medicine

## 2012-10-15 DIAGNOSIS — IMO0001 Reserved for inherently not codable concepts without codable children: Secondary | ICD-10-CM

## 2012-11-26 ENCOUNTER — Ambulatory Visit (HOSPITAL_COMMUNITY): Payer: Medicare Other

## 2012-12-10 ENCOUNTER — Other Ambulatory Visit (HOSPITAL_COMMUNITY): Payer: Self-pay | Admitting: Family Medicine

## 2012-12-10 ENCOUNTER — Ambulatory Visit (HOSPITAL_COMMUNITY)
Admission: RE | Admit: 2012-12-10 | Discharge: 2012-12-10 | Disposition: A | Discharge status: Home / Self Care | Payer: Medicare Other | Source: Ambulatory Visit | Attending: Family Medicine | Admitting: Family Medicine

## 2012-12-10 DIAGNOSIS — R059 Cough, unspecified: Secondary | ICD-10-CM

## 2012-12-10 DIAGNOSIS — J45909 Unspecified asthma, uncomplicated: Secondary | ICD-10-CM

## 2012-12-10 DIAGNOSIS — R05 Cough: Secondary | ICD-10-CM | POA: Insufficient documentation

## 2012-12-10 DIAGNOSIS — J209 Acute bronchitis, unspecified: Secondary | ICD-10-CM | POA: Insufficient documentation

## 2012-12-25 ENCOUNTER — Ambulatory Visit (HOSPITAL_COMMUNITY)
Admission: RE | Admit: 2012-12-25 | Discharge: 2012-12-25 | Disposition: A | Discharge status: Home / Self Care | Payer: Medicare Other | Source: Ambulatory Visit | Attending: Family Medicine | Admitting: Family Medicine

## 2012-12-25 DIAGNOSIS — IMO0001 Reserved for inherently not codable concepts without codable children: Secondary | ICD-10-CM

## 2012-12-25 DIAGNOSIS — Z1231 Encounter for screening mammogram for malignant neoplasm of breast: Secondary | ICD-10-CM | POA: Insufficient documentation

## 2013-01-02 ENCOUNTER — Ambulatory Visit (HOSPITAL_COMMUNITY)
Admission: RE | Admit: 2013-01-02 | Discharge: 2013-01-02 | Disposition: A | Discharge status: Home / Self Care | Payer: Medicare Other | Source: Ambulatory Visit | Attending: Family Medicine | Admitting: Family Medicine

## 2013-01-02 ENCOUNTER — Other Ambulatory Visit (HOSPITAL_COMMUNITY): Payer: Self-pay | Admitting: Family Medicine

## 2013-01-02 DIAGNOSIS — M25519 Pain in unspecified shoulder: Secondary | ICD-10-CM

## 2013-01-09 ENCOUNTER — Ambulatory Visit (HOSPITAL_COMMUNITY)
Admission: RE | Admit: 2013-01-09 | Discharge: 2013-01-09 | Disposition: A | Discharge status: Home / Self Care | Payer: Medicare Other | Source: Ambulatory Visit | Attending: Cardiovascular Disease | Admitting: Cardiovascular Disease

## 2013-01-09 DIAGNOSIS — R011 Cardiac murmur, unspecified: Secondary | ICD-10-CM | POA: Insufficient documentation

## 2013-01-09 NOTE — Progress Notes (Signed)
*  PRELIMINARY RESULTS* Echocardiogram 2D Echocardiogram has been performed.  Vickie [name] 01/09/2013, 11:59 AM

## 2013-02-08 ENCOUNTER — Other Ambulatory Visit (HOSPITAL_COMMUNITY): Payer: Self-pay | Admitting: Internal Medicine

## 2013-02-08 DIAGNOSIS — Z139 Encounter for screening, unspecified: Secondary | ICD-10-CM

## 2013-02-12 ENCOUNTER — Ambulatory Visit (HOSPITAL_COMMUNITY)
Admission: RE | Admit: 2013-02-12 | Discharge: 2013-02-12 | Disposition: A | Discharge status: Home / Self Care | Payer: Medicare Other | Source: Ambulatory Visit | Attending: Internal Medicine | Admitting: Internal Medicine

## 2013-02-12 DIAGNOSIS — Z13820 Encounter for screening for osteoporosis: Secondary | ICD-10-CM | POA: Insufficient documentation

## 2013-02-12 DIAGNOSIS — Z780 Asymptomatic menopausal state: Secondary | ICD-10-CM | POA: Insufficient documentation

## 2013-02-12 DIAGNOSIS — M899 Disorder of bone, unspecified: Secondary | ICD-10-CM | POA: Insufficient documentation

## 2013-02-12 DIAGNOSIS — Z139 Encounter for screening, unspecified: Secondary | ICD-10-CM

## 2013-03-31 ENCOUNTER — Encounter (HOSPITAL_COMMUNITY): Payer: Self-pay | Admitting: Emergency Medicine

## 2013-03-31 ENCOUNTER — Emergency Department (HOSPITAL_COMMUNITY): Payer: Medicare Other

## 2013-03-31 ENCOUNTER — Emergency Department (HOSPITAL_COMMUNITY)
Admission: EM | Admit: 2013-03-31 | Discharge: 2013-03-31 | Disposition: A | Discharge status: Home / Self Care | Payer: Medicare Other | Attending: Emergency Medicine | Admitting: Emergency Medicine

## 2013-03-31 DIAGNOSIS — R079 Chest pain, unspecified: Secondary | ICD-10-CM

## 2013-03-31 DIAGNOSIS — R0789 Other chest pain: Secondary | ICD-10-CM | POA: Insufficient documentation

## 2013-03-31 DIAGNOSIS — Z8739 Personal history of other diseases of the musculoskeletal system and connective tissue: Secondary | ICD-10-CM | POA: Insufficient documentation

## 2013-03-31 DIAGNOSIS — I2510 Atherosclerotic heart disease of native coronary artery without angina pectoris: Secondary | ICD-10-CM | POA: Insufficient documentation

## 2013-03-31 DIAGNOSIS — R61 Generalized hyperhidrosis: Secondary | ICD-10-CM | POA: Insufficient documentation

## 2013-03-31 DIAGNOSIS — I10 Essential (primary) hypertension: Secondary | ICD-10-CM | POA: Insufficient documentation

## 2013-03-31 DIAGNOSIS — Z79899 Other long term (current) drug therapy: Secondary | ICD-10-CM | POA: Insufficient documentation

## 2013-03-31 DIAGNOSIS — J449 Chronic obstructive pulmonary disease, unspecified: Secondary | ICD-10-CM | POA: Insufficient documentation

## 2013-03-31 DIAGNOSIS — E119 Type 2 diabetes mellitus without complications: Secondary | ICD-10-CM | POA: Insufficient documentation

## 2013-03-31 DIAGNOSIS — J4489 Other specified chronic obstructive pulmonary disease: Secondary | ICD-10-CM | POA: Insufficient documentation

## 2013-03-31 LAB — COMPREHENSIVE METABOLIC PANEL
ALT: 18 U/L (ref 0–35)
AST: 15 U/L (ref 0–37)
CO2: 26 mEq/L (ref 19–32)
Calcium: 10.4 mg/dL (ref 8.4–10.5)
Chloride: 104 mEq/L (ref 96–112)
GFR calc non Af Amer: 63 mL/min — ABNORMAL LOW (ref >90)
Sodium: 140 mEq/L (ref 135–145)

## 2013-03-31 LAB — URINALYSIS, ROUTINE W REFLEX MICROSCOPIC
Bilirubin Urine: NEGATIVE
Nitrite: NEGATIVE
Specific Gravity, Urine: 1.014 (ref 1.005–1.030)
pH: 7.5 (ref 5.0–8.0)

## 2013-03-31 LAB — URINE MICROSCOPIC-ADD ON

## 2013-03-31 LAB — CBC
MCH: 29.8 pg (ref 26.0–34.0)
Platelets: 275 10*3/uL (ref 150–400)
RBC: 4.7 MIL/uL (ref 3.87–5.11)
WBC: 10 10*3/uL (ref 4.0–10.5)

## 2013-03-31 LAB — APTT: aPTT: 25 seconds (ref 24–37)

## 2013-03-31 MED ORDER — SODIUM CHLORIDE 0.9 % IV SOLN
1000.0000 mL | INTRAVENOUS | Status: DC
  Administered 2013-03-31: 1000 mL via INTRAVENOUS

## 2013-03-31 NOTE — ED Notes (Signed)
Discharge instructions reviewed. Pt verbalized understanding.  

## 2013-03-31 NOTE — ED Notes (Signed)
Per EMS pt c/o central chest pain that radiated to rt side of jaw and down rt leg. Pt describes pain as a pressure and a sharp pain. Pt took 324 asa and 3 nitro prior to ems arrival. Pt was given 3 more nitro en route and 2L O2 rated pain a 2/10. Pt was NSR on monitior, ekg unremarkable. Vitals 130/76, pulse 85-90. 20g L hand.

## 2013-03-31 NOTE — ED Provider Notes (Signed)
History     CSN: [id number]  Arrival date & time 03/31/13  1356   First MD Initiated Contact with Patient 03/31/13 1402      Chief Complaint  Patient presents with  . Chest Pain    (Consider location/radiation/quality/duration/timing/severity/associated sxs/prior treatment) HPI Comments: Patient is a 67 year old lady who was in church. She says all of a sudden she had pain in her in the chest, and it went up into her jaw. She had a feeling as though something were pressing on her chest. She also had a feeling of needles in her left leg. She became sweaty. This happened an hour prior to her arrival at [facility] ED. When this happens, she took 3 nitroglycerin, and took 4 baby aspirin. He EMS brought her to the hospital. She has had prior similar episodes.  Patient is a 67 y.o. female presenting with chest pain. The history is provided by the patient and medical records. No language interpreter was used.  Chest Pain Pain location:  Substernal area Pain quality: pressure   Pain radiates to:  L jaw Pain radiates to the back: no   Pain severity now: Patient's pain is much better now. She currently rates it at a 2. Onset quality:  Sudden Duration:  30 minutes Timing:  Constant Progression:  Resolved Chronicity:  Recurrent Relieved by:  Aspirin and nitroglycerin Worsened by:  Nothing tried Associated symptoms: diaphoresis   Associated symptoms: no fever   Risk factors: coronary artery disease     Past Medical History  Diagnosis Date  . Coronary artery disease   . COPD (chronic obstructive pulmonary disease)   . Diabetes mellitus   . Arthritis   . Hypertension     Past Surgical History  Procedure Laterality Date  . Abdominal hysterectomy      History reviewed. No pertinent family history.  History  Substance Use Topics  . Smoking status: Never Smoker   . Smokeless tobacco: Not on file  . Alcohol Use: No    OB History   Grav Para Term Preterm Abortions TAB SAB Ect  Mult Living                  Review of Systems  Constitutional: Positive for diaphoresis. Negative for fever and chills.  HENT: Negative.   Eyes: Negative.   Respiratory: Negative.   Cardiovascular: Positive for chest pain.  Gastrointestinal: Negative.   Genitourinary: Negative.   Musculoskeletal: Negative.   Skin: Negative.   Neurological:       A feeling of pins and needles in her left leg.  Psychiatric/Behavioral: Negative.     Allergies  Bactrim; Levaquin; Sudafed; Adhesive; and Penicillins  Home Medications   Current Outpatient Rx  Name  Route  Sig  Dispense  Refill  . beclomethasone (QVAR) 80 MCG/ACT inhaler   Inhalation   Inhale 2 puffs into the lungs as needed.         Marland Kitchen escitalopram (LEXAPRO) 20 MG tablet   Oral   Take 20 mg by mouth daily.         . fenofibrate 160 MG tablet   Oral   Take 160 mg by mouth daily.         . mirtazapine (REMERON) 15 MG tablet   Oral   Take 15 mg by mouth at bedtime.         . nitroGLYCERIN (NITROSTAT) 0.4 MG SL tablet   Sublingual   Place 0.4 mg under the tongue every 5 (five) minutes  as needed.         . pantoprazole (PROTONIX) 40 MG tablet   Oral   Take 40 mg by mouth daily.           BP 123/75  Pulse 81  Temp(Src) 98.2 F (36.8 C) (Oral)  Resp 19  SpO2 99%  Physical Exam  Nursing note and vitals reviewed. Constitutional: She is oriented to person, place, and time. She appears well-developed and well-nourished. No distress.  HENT:  Head: Normocephalic and atraumatic.  Right Ear: External ear normal.  Left Ear: External ear normal.  Mouth/Throat: Oropharynx is clear and moist.  Eyes: Conjunctivae and EOM are normal. Pupils are equal, round, and reactive to light. No scleral icterus.  Neck: Normal range of motion. Neck supple.  Cardiovascular: Normal rate, regular rhythm and normal heart sounds.   Pulmonary/Chest: Effort normal and breath sounds normal.  Abdominal: Soft. Bowel sounds are  normal.  Musculoskeletal: Normal range of motion. She exhibits no edema and no tenderness.  Lymphadenopathy:    She has no cervical adenopathy.  Neurological: She is alert and oriented to person, place, and time.  No sensory or motor deficit.  Skin: Skin is warm and dry.  Psychiatric: She has a normal mood and affect. Her behavior is normal.    ED Course  Procedures (including critical care time)  Labs Reviewed  CBC  COMPREHENSIVE METABOLIC PANEL  PROTIME-INR  APTT  URINALYSIS, ROUTINE W REFLEX MICROSCOPIC    Date: 03/31/2013  Rate: 86  Rhythm: normal sinus rhythm  QRS Axis: normal  Intervals: normal  ST/T Wave abnormalities: nonspecific ST/T changes  Conduction Disutrbances:none  Narrative Interpretation: Essentiallly normal EKg  Old EKG Reviewed: unchanged  4:19 PM Results for orders placed during the hospital encounter of 03/31/13  CBC      Result Value Range   WBC 10.0  4.0 - 10.5 K/uL   RBC 4.70  3.87 - 5.11 MIL/uL   Hemoglobin 14.0  12.0 - 15.0 g/dL   HCT 40.9  81.1 - 91.4 %   MCV 86.6  78.0 - 100.0 fL   MCH 29.8  26.0 - 34.0 pg   MCHC 34.4  30.0 - 36.0 g/dL   RDW 78.2  95.6 - 21.3 %   Platelets 275  150 - 400 K/uL  COMPREHENSIVE METABOLIC PANEL      Result Value Range   Sodium 140  135 - 145 mEq/L   Potassium 4.2  3.5 - 5.1 mEq/L   Chloride 104  96 - 112 mEq/L   CO2 26  19 - 32 mEq/L   Glucose, Bld 101 (*) 70 - 99 mg/dL   BUN 21  6 - 23 mg/dL   Creatinine, Ser 0.86  0.50 - 1.10 mg/dL   Calcium 57.8  8.4 - 46.9 mg/dL   Total Protein 6.8  6.0 - 8.3 g/dL   Albumin 3.9  3.5 - 5.2 g/dL   AST 15  0 - 37 U/L   ALT 18  0 - 35 U/L   Alkaline Phosphatase 51  39 - 117 U/L   Total Bilirubin 0.2 (*) 0.3 - 1.2 mg/dL   GFR calc non Af Amer 63 (*) >90 mL/min   GFR calc Af Amer 73 (*) >90 mL/min  PROTIME-INR      Result Value Range   Prothrombin Time 11.5 (*) 11.6 - 15.2 seconds   INR 0.84  0.00 - 1.49  APTT      Result Value Range   aPTT  25  24 - 37 seconds   URINALYSIS, ROUTINE W REFLEX MICROSCOPIC      Result Value Range   Color, Urine YELLOW  YELLOW   APPearance CLOUDY (*) CLEAR   Specific Gravity, Urine 1.014  1.005 - 1.030   pH 7.5  5.0 - 8.0   Glucose, UA NEGATIVE  NEGATIVE mg/dL   Hgb urine dipstick NEGATIVE  NEGATIVE   Bilirubin Urine NEGATIVE  NEGATIVE   Ketones, ur NEGATIVE  NEGATIVE mg/dL   Protein, ur NEGATIVE  NEGATIVE mg/dL   Urobilinogen, UA 0.2  0.0 - 1.0 mg/dL   Nitrite NEGATIVE  NEGATIVE   Leukocytes, UA TRACE (*) NEGATIVE  URINE MICROSCOPIC-ADD ON      Result Value Range   Squamous Epithelial / LPF FEW (*) RARE   WBC, UA 0-2  <3 WBC/hpf   Urine-Other AMORPHOUS URATES/PHOSPHATES    POCT I-STAT TROPONIN I      Result Value Range   Troponin i, poc 0.02  0.00 - 0.08 ng/mL   Comment 3            Dg Chest Portable 1 View  03/31/2013  *RADIOLOGY REPORT*  Clinical Data: Chest pain, shortness of breath.  PORTABLE CHEST - 1 VIEW  Comparison: 10/22/2010  Findings: Stable mild cardiomegaly.  Lungs are clear.  No effusion. Regional bones unremarkable.  IMPRESSION:  1.  No acute disease   Original Report Authenticated By: D. Andria Rhein, MD     Lab workup entirely normal.  Pt asymptomatic.  Pt can go home, have followup with Dr. Phillips Odor, her PCP, or Dr. Alanda Amass, her cardiologist. I reviewed her lab tests, chest x-ray and EKG results with pt and with her daughter.  1. Nonspecific chest pain           Carleene Cooper III, MD 03/31/13 1622

## 2013-06-03 ENCOUNTER — Other Ambulatory Visit (HOSPITAL_COMMUNITY): Payer: Self-pay

## 2013-06-03 DIAGNOSIS — J441 Chronic obstructive pulmonary disease with (acute) exacerbation: Secondary | ICD-10-CM

## 2013-06-18 ENCOUNTER — Ambulatory Visit (HOSPITAL_COMMUNITY)
Admission: RE | Admit: 2013-06-18 | Discharge: 2013-06-18 | Disposition: A | Discharge status: Home / Self Care | Payer: Medicare Other | Source: Ambulatory Visit | Attending: Pulmonary Disease | Admitting: Pulmonary Disease

## 2013-06-18 DIAGNOSIS — J4489 Other specified chronic obstructive pulmonary disease: Secondary | ICD-10-CM | POA: Insufficient documentation

## 2013-06-18 DIAGNOSIS — J449 Chronic obstructive pulmonary disease, unspecified: Secondary | ICD-10-CM | POA: Insufficient documentation

## 2013-06-18 MED ORDER — ALBUTEROL SULFATE (5 MG/ML) 0.5% IN NEBU
2.5000 mg | INHALATION_SOLUTION | Freq: Once | RESPIRATORY_TRACT | Status: AC
  Administered 2013-06-18: 2.5 mg via RESPIRATORY_TRACT

## 2013-06-20 NOTE — Procedures (Signed)
NAME:  Vickie [name], Vickie [name]              ACCOUNT NO.:  [id number]  MEDICAL RECORD NO.:  [id number]  LOCATION:  RESP                          FACILITY:  [facility]  PHYSICIAN:  Nicholette Dolson L. Juanetta Gosling, M.D.DATE OF BIRTH:  11-17-1946  DATE OF PROCEDURE: DATE OF DISCHARGE:  06/18/2013                           PULMONARY FUNCTION TEST   REASON FOR PULMONARY FUNCTION TESTING: 1. COPD.  1. Spirometry shows a mild ventilatory defect without definite airflow     obstruction. 2. Lung volumes are normal. 3. DLCO is normal. 4. Airway resistance is slightly high suggesting airflow obstruction. 5. There is no significant bronchodilator improvement. 6. This study shows some evidence of airflow obstruction.     Shonte Soderlund L. Juanetta Gosling, M.D.     ELH/MEDQ  D:  06/19/2013  T:  06/20/2013  Job:  [id number]

## 2013-07-01 LAB — PULMONARY FUNCTION TEST

## 2013-07-02 ENCOUNTER — Encounter: Payer: Self-pay | Admitting: Cardiovascular Disease

## 2013-07-08 ENCOUNTER — Other Ambulatory Visit: Payer: Self-pay | Admitting: Cardiovascular Disease

## 2013-07-08 LAB — LIPID PANEL
Cholesterol: 192 mg/dL (ref 0–200)
HDL: 56 mg/dL (ref >39)
Total CHOL/HDL Ratio: 3.4 Ratio
Triglycerides: 125 mg/dL (ref <150)

## 2013-09-27 ENCOUNTER — Other Ambulatory Visit: Payer: Self-pay | Admitting: *Deleted

## 2013-09-27 MED ORDER — PRAVASTATIN SODIUM 40 MG PO TABS: 40.0000 mg | ORAL_TABLET | Freq: Every evening | ORAL | Status: DC

## 2013-09-27 NOTE — Telephone Encounter (Signed)
Rx was sent to pharmacy electronically. 

## 2013-10-29 ENCOUNTER — Encounter (INDEPENDENT_AMBULATORY_CARE_PROVIDER_SITE_OTHER): Payer: Self-pay | Admitting: Internal Medicine

## 2013-10-29 ENCOUNTER — Ambulatory Visit (INDEPENDENT_AMBULATORY_CARE_PROVIDER_SITE_OTHER): Payer: Medicare Other | Admitting: Internal Medicine

## 2013-10-29 VITALS — BP 122/68 | HR 60 | Temp 98.5°F | Ht 60.0 in | Wt 169.5 lb

## 2013-10-29 DIAGNOSIS — R1310 Dysphagia, unspecified: Secondary | ICD-10-CM

## 2013-10-29 NOTE — Progress Notes (Addendum)
Subjective:     Patient ID: Vickie [name], female   DOB: 08-13-1946, 67 y.o.   MRN: [id number]  HPIReferred to our office by Dr. Sherwood Gambler for possible esophageal stricture. She says she is choking on her foods. She tells me foods are lodging in her esophagus at times. When she coughs, she says her arms and chest will hurt. She has spoken with her cardiologist and was told this was not cardiac.  She has had symptoms for the past 2 yrs. Symptoms occur about once a week. No foods in particular will lodge in her esophagus.  Sometimes she will cough and sometimes she will drink water to have the bolus go down.  She is eating in small bites. Appetite is good.  She has weight loss of 35 pounds intentionally She usually has a BM once every other day. No melena or bright red rectal bleeding.  Review of Systems see hpi Current Outpatient Prescriptions  Medication Sig Dispense Refill  . alendronate (FOSAMAX) 35 MG tablet Take 35 mg by mouth every 7 (seven) days. Take with a full glass of water on an empty stomach.      . ALPRAZolam (XANAX) 0.5 MG tablet Take 0.25 mg by mouth every morning.      Marland Kitchen aspirin 81 MG chewable tablet Chew 324 mg by mouth once.      . beclomethasone (QVAR) 80 MCG/ACT inhaler Inhale 1 puff into the lungs 2 (two) times daily as needed. For wheezing      . Calcium Carbonate-Vitamin D (CALCIUM + D PO) Take 1 tablet by mouth every morning.      . Coenzyme Q10 (COQ10) 200 MG CAPS Take 1 tablet by mouth every morning.      Marland Kitchen CRANBERRY PO Take 1 capsule by mouth every morning. 3600mg       . Docusate Sodium (EQ STOOL SOFTENER PO) Take 1 tablet by mouth at bedtime.      . enalapril (VASOTEC) 5 MG tablet Take 5 mg by mouth every morning.      . escitalopram (LEXAPRO) 20 MG tablet Take 20 mg by mouth every morning.       . fenofibrate 160 MG tablet Take 160 mg by mouth every evening.       . Magnesium 250 MG TABS Take 1 tablet by mouth at bedtime.      . meloxicam (MOBIC) 7.5 MG tablet  Take 7.5 mg by mouth 2 (two) times daily.      . mirtazapine (REMERON) 15 MG tablet Take 15 mg by mouth at bedtime.      . Multiple Vitamin (MULTIVITAMIN WITH MINERALS) TABS Take 1 tablet by mouth every morning.      . nitroGLYCERIN (NITROSTAT) 0.4 MG SL tablet Place 0.4 mg under the tongue every 5 (five) minutes as needed for chest pain. x3 doses as needed for chest pain      . Nutritional Supplements (ESTROVEN PO) Take 1 tablet by mouth every morning.      . Omega-3 Fatty Acids (FISH OIL) 1200 MG CAPS Take 1 capsule by mouth at bedtime.      . pantoprazole (PROTONIX) 40 MG tablet Take 40 mg by mouth 2 (two) times daily.       . pravastatin (PRAVACHOL) 40 MG tablet Take 1 tablet (40 mg total) by mouth every evening.  30 tablet  10  . tiotropium (SPIRIVA) 18 MCG inhalation capsule Place 18 mcg into inhaler and inhale daily.       No  current facility-administered medications for this visit.   Past Medical History  Diagnosis Date  . Coronary artery disease   . COPD (chronic obstructive pulmonary disease)   . Diabetes mellitus   . Arthritis   . Hypertension    Past Surgical History  Procedure Laterality Date  . Abdominal hysterectomy    . Lipoma excision      rt anterior abdomen   Allergies  Allergen Reactions  . Bactrim [Sulfamethoxazole-Trimethoprim] Shortness Of Breath and Rash    REACTION: Choking, inability to swallow, redness  . Levaquin [Levofloxacin] Shortness Of Breath, Rash and Other (See Comments)    Reaction:Choking Brand name Levaquin ok per pt  . Sudafed [Pseudoephedrine Hcl] Shortness Of Breath, Rash and Other (See Comments)    REACTION: Choking, redness, inability to swallow  . Adhesive [Tape] Other (See Comments)    REACTION: redness/irritation at application site. **Certain bandages/adhesives cause this reaction**  . Penicillins Other (See Comments)    REACTION: Faint ("passes out")        Objective:   Physical Exam Filed Vitals:   10/29/13 1043  BP:  122/68  Pulse: 60  Temp: 98.5 F (36.9 C)  Height: 5' (1.524 m)  Weight: 169 lb 8 oz (76.885 kg)   Alert and oriented. Skin warm and dry. Oral mucosa is moist.   . Sclera anicteric, conjunctivae is pink. Thyroid not enlarged. No cervical lymphadenopathy. Lungs clear. Heart regular rate and rhythm.  Abdomen is soft. Bowel sounds are positive. No hepatomegaly. No abdominal masses felt. No tenderness.  No edema to lower extremities.       Assessment:    ? Dysphagia to solids. Esophageal stricture needs to be ruled out     Plan:     Esophagram.  Further recommendations once we have results back.

## 2013-10-29 NOTE — Patient Instructions (Signed)
Esophagram.  

## 2013-10-30 ENCOUNTER — Ambulatory Visit (HOSPITAL_COMMUNITY)
Admission: RE | Admit: 2013-10-30 | Discharge: 2013-10-30 | Disposition: A | Discharge status: Home / Self Care | Payer: Medicare Other | Source: Ambulatory Visit | Attending: Internal Medicine | Admitting: Internal Medicine

## 2013-10-30 DIAGNOSIS — R1310 Dysphagia, unspecified: Secondary | ICD-10-CM | POA: Insufficient documentation

## 2013-10-31 ENCOUNTER — Other Ambulatory Visit (INDEPENDENT_AMBULATORY_CARE_PROVIDER_SITE_OTHER): Payer: Self-pay | Admitting: Internal Medicine

## 2013-10-31 DIAGNOSIS — R1311 Dysphagia, oral phase: Secondary | ICD-10-CM

## 2013-11-08 ENCOUNTER — Other Ambulatory Visit (INDEPENDENT_AMBULATORY_CARE_PROVIDER_SITE_OTHER): Payer: Self-pay | Admitting: Internal Medicine

## 2013-11-08 DIAGNOSIS — R1310 Dysphagia, unspecified: Secondary | ICD-10-CM

## 2013-11-11 ENCOUNTER — Telehealth (INDEPENDENT_AMBULATORY_CARE_PROVIDER_SITE_OTHER): Payer: Self-pay | Admitting: Internal Medicine

## 2013-11-11 NOTE — Telephone Encounter (Signed)
Opened in error

## 2013-11-12 ENCOUNTER — Ambulatory Visit (HOSPITAL_COMMUNITY)
Admission: RE | Admit: 2013-11-12 | Discharge: 2013-11-12 | Disposition: A | Discharge status: Home / Self Care | Payer: Medicare Other | Source: Ambulatory Visit | Attending: Internal Medicine | Admitting: Internal Medicine

## 2013-11-12 ENCOUNTER — Other Ambulatory Visit (INDEPENDENT_AMBULATORY_CARE_PROVIDER_SITE_OTHER): Payer: Self-pay | Admitting: Internal Medicine

## 2013-11-12 ENCOUNTER — Telehealth (INDEPENDENT_AMBULATORY_CARE_PROVIDER_SITE_OTHER): Payer: Self-pay | Admitting: Internal Medicine

## 2013-11-12 DIAGNOSIS — R1310 Dysphagia, unspecified: Secondary | ICD-10-CM | POA: Insufficient documentation

## 2013-11-12 DIAGNOSIS — E119 Type 2 diabetes mellitus without complications: Secondary | ICD-10-CM | POA: Insufficient documentation

## 2013-11-12 DIAGNOSIS — I10 Essential (primary) hypertension: Secondary | ICD-10-CM | POA: Insufficient documentation

## 2013-11-12 DIAGNOSIS — J4489 Other specified chronic obstructive pulmonary disease: Secondary | ICD-10-CM | POA: Insufficient documentation

## 2013-11-12 DIAGNOSIS — IMO0001 Reserved for inherently not codable concepts without codable children: Secondary | ICD-10-CM | POA: Insufficient documentation

## 2013-11-12 DIAGNOSIS — J449 Chronic obstructive pulmonary disease, unspecified: Secondary | ICD-10-CM | POA: Insufficient documentation

## 2013-11-12 NOTE — Procedures (Signed)
Objective Swallowing Evaluation: Modified Barium Swallowing Study   Patient Details  Name: Vickie [name] MRN: [id number] Date of Birth: 12-Apr-1946  Today's Date: 11/12/2013 Time: 10:30AM  - 11:15 AM    Past Medical History:  Past Medical History  Diagnosis Date  . Coronary artery disease   . COPD (chronic obstructive pulmonary disease)   . Diabetes mellitus   . Arthritis   . Hypertension    Past Surgical History:  Past Surgical History  Procedure Laterality Date  . Abdominal hysterectomy    . Lipoma excision      rt anterior abdomen   HPI:  [name] is a 67 yo woman referred for MBSS by Dorene Ar and Dr. Karilyn Cota due to pt complaints of coughing/choking episodes. Barium swallow completed 2 weeks ago was essentially normal except for pooling noted in pyriforms with thin liquids. The pt tells me that she has a history of COPD, CAD, DM and arthritis. 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      Recommendation/Prognosis  Clinical Impression Dysphagia Diagnosis: Mild pharyngeal phase dysphagia Clinical impression: Mild pharyngeal phase dysphagia characterized by slight premature spillage of liquids into vallecular and pyriform space. Laryngeal excursion WFL. No penetration or aspiration observed, however trace residuals of thin liquids noted after the swallow in pyriforms which clears with repeat swallows. Pt was challenged via straw sips, head turns, and cued to talk immediately post swallow and no penetration or aspiration occurred. SLP spoke with pt at length in attempt to figure out triggers for coughing episodes. Pt reports GERD and takes protonix once per day in the AM. It is possible that her coughing episodes are triggered by reflux and/or COPD. Pt was encouraged the raise the head of her bed 4 inches (currently just has extra pillow) and consider splitting PPI to BID if recommended by Dr. Karilyn Cota. Pt also  encouraged to sip on water throughout the day to combat dry mouth. She was given SLP contact information if further questions/needs arise. Swallow Evaluation Recommendations Diet Recommendations: Regular;Thin liquid Liquid Administration via: Cup;Straw Medication Administration: Whole meds with liquid Supervision: Patient able to self feed Compensations: Multiple dry swallows after each bite/sip Postural Changes and/or Swallow Maneuvers: Out of bed for meals;Seated upright 90 degrees;Upright 30-60 min after meal Oral Care Recommendations: Oral care BID Other Recommendations: Clarify dietary restrictions Follow up Recommendations: None Prognosis Prognosis for Safe Diet Advancement: Good Individuals Consulted Consulted and Agree with Results and Recommendations: Patient Report Sent to : Referring physician   General:  Date of Onset: 11/08/13 HPI: [name] is a 67 yo woman referred for MBSS by Dorene Ar and Dr. Karilyn Cota due to pt complaints of coughing/choking episodes. Barium swallow completed 2 weeks ago was essentially normal except for pooling noted in pyriforms with thin liquids. The pt tells me that she has a history of COPD, CAD, DM and arthritis. 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  Type of Study: Modified Barium Swallowing Study Reason for Referral: Objectively evaluate swallowing function Diet Prior to this Study: Regular;Thin liquids Temperature Spikes Noted: No Respiratory Status: Room air History of Recent Intubation: No Behavior/Cognition: Alert;Cooperative;Pleasant mood Oral Cavity - Dentition: Adequate natural dentition Oral Motor / Sensory Function: Within functional limits Self-Feeding Abilities: Able to feed self Patient Positioning: Upright in chair Baseline Vocal Quality: Clear Volitional Cough: Strong Volitional Swallow: Able to elicit Anatomy:  Other (Comment) (cervical osteophytes  noted) Pharyngeal Secretions: Not observed secondary MBS  Reason for Referral:  Objectively evaluate swallowing function   Oral Phase Oral Preparation/Oral Phase Oral Phase: WFL Pharyngeal Phase  Pharyngeal Phase Pharyngeal Phase: Impaired Pharyngeal - Thin Pharyngeal - Thin Cup: Premature spillage to valleculae;Premature spillage to pyriform sinuses;Pharyngeal residue - pyriform sinuses Pharyngeal - Thin Straw: Premature spillage to valleculae;Premature spillage to pyriform sinuses;Pharyngeal residue - pyriform sinuses Pharyngeal - Solids Pharyngeal - Puree: Within functional limits Pharyngeal - Regular: Within functional limits Pharyngeal - Pill: Within functional limits Pharyngeal Phase - Comment Pharyngeal Comment: Trace residuals of thin liquids noted in pyriform, but do not appear to be clinically significant Cervical Esophageal Phase  Cervical Esophageal Phase Cervical Esophageal Phase: [facility]   G-Codes   Thank you,  Vickie [name], CCC-SLP [phone]  PORTER,DABNEY 11/12/2013, 3:40 PM

## 2013-11-12 NOTE — Telephone Encounter (Signed)
Results given to patient

## 2013-11-20 NOTE — Progress Notes (Signed)
Apt has been scheduled for 02/17/13 with Dr. Karilyn Cota.

## 2013-11-26 ENCOUNTER — Other Ambulatory Visit (HOSPITAL_COMMUNITY): Payer: Self-pay | Admitting: Family Medicine

## 2013-11-26 DIAGNOSIS — Z139 Encounter for screening, unspecified: Secondary | ICD-10-CM

## 2013-12-27 ENCOUNTER — Ambulatory Visit (HOSPITAL_COMMUNITY)
Admission: RE | Admit: 2013-12-27 | Discharge: 2013-12-27 | Disposition: A | Discharge status: Home / Self Care | Payer: Medicare Other | Source: Ambulatory Visit | Attending: Family Medicine | Admitting: Family Medicine

## 2013-12-27 DIAGNOSIS — Z139 Encounter for screening, unspecified: Secondary | ICD-10-CM

## 2013-12-27 DIAGNOSIS — Z1231 Encounter for screening mammogram for malignant neoplasm of breast: Secondary | ICD-10-CM | POA: Insufficient documentation

## 2014-01-07 ENCOUNTER — Ambulatory Visit: Payer: Medicare Other | Admitting: Cardiology

## 2014-01-10 ENCOUNTER — Other Ambulatory Visit: Payer: Self-pay | Admitting: *Deleted

## 2014-01-10 MED ORDER — PANTOPRAZOLE SODIUM 40 MG PO TBEC: 40.0000 mg | DELAYED_RELEASE_TABLET | Freq: Two times a day (BID) | ORAL | Status: DC

## 2014-01-31 ENCOUNTER — Telehealth: Payer: Self-pay | Admitting: *Deleted

## 2014-01-31 NOTE — Telephone Encounter (Signed)
Returned call and pt verified x 2.  Pt stated she received a letter from [facility] about Protonix.  Stated they only approved two tablets although her prescription reads twice daily.  Pt informed Ivin Booty, RN will be notified to contact insurance company r/t possible override.  Pt verbalized understanding and agreed w/ plan.  Pt stated she has enough to last through next week.  [employer] number: [phone]  Message forwarded to Ivin Booty, Therapist, sports.

## 2014-01-31 NOTE — Telephone Encounter (Signed)
Pt received a letter from [facility] about her medication Protonix. She has some questions.  [place]

## 2014-02-03 NOTE — Telephone Encounter (Signed)
Left message to call back --- Will inform patient GI doctor should manage her dosage of PROTONIX.

## 2014-02-04 NOTE — Telephone Encounter (Signed)
Returning your call from yesterday. °

## 2014-02-05 NOTE — Telephone Encounter (Signed)
Spoke to patient- informed her contact GI  Dr [facility] Patient also wanted her mobic refilled. Informed  Her to contact her PCP. SHE VERBALIZED UNDERSTANDING.

## 2014-02-07 ENCOUNTER — Ambulatory Visit (HOSPITAL_COMMUNITY)
Admission: RE | Admit: 2014-02-07 | Discharge: 2014-02-07 | Disposition: A | Discharge status: Home / Self Care | Payer: Medicare Other | Source: Ambulatory Visit | Attending: Family Medicine | Admitting: Family Medicine

## 2014-02-07 ENCOUNTER — Other Ambulatory Visit (HOSPITAL_COMMUNITY): Payer: Self-pay | Admitting: Family Medicine

## 2014-02-07 DIAGNOSIS — R059 Cough, unspecified: Secondary | ICD-10-CM | POA: Insufficient documentation

## 2014-02-07 DIAGNOSIS — R05 Cough: Secondary | ICD-10-CM

## 2014-02-07 DIAGNOSIS — R079 Chest pain, unspecified: Secondary | ICD-10-CM | POA: Insufficient documentation

## 2014-02-07 DIAGNOSIS — J449 Chronic obstructive pulmonary disease, unspecified: Secondary | ICD-10-CM

## 2014-02-17 ENCOUNTER — Ambulatory Visit (INDEPENDENT_AMBULATORY_CARE_PROVIDER_SITE_OTHER): Payer: Medicare Other | Admitting: Internal Medicine

## 2014-02-17 ENCOUNTER — Encounter (INDEPENDENT_AMBULATORY_CARE_PROVIDER_SITE_OTHER): Payer: Self-pay | Admitting: Internal Medicine

## 2014-02-17 VITALS — BP 130/80 | HR 76 | Temp 98.1°F | Resp 18 | Ht 60.0 in | Wt 174.1 lb

## 2014-02-17 DIAGNOSIS — R1310 Dysphagia, unspecified: Secondary | ICD-10-CM | POA: Insufficient documentation

## 2014-02-17 NOTE — Patient Instructions (Signed)
OV in 6 months. Chew foods well. Protonix 40mg  daily. May take Tums prn.

## 2014-02-17 NOTE — Progress Notes (Signed)
Subjective:     Patient ID: Vickie [name], female   DOB: 09-22-1946, 68 y.o.   MRN: [id number]  HPI Here today for f/u of her dysphagia. She underwent a Esophagram which was normal.  She tells me it stills feel like there is a bump when she swallows. She denies food lodging. If she drinks water, she sometimes will choke.  She is chewing her foods well. There has been no weight loss.  She does have acid reflux. Presently taking Protonix 40mg  daily. Her appetite is good.  Her bowels are moving normal.   11/08/2013 Speech Pathology: Recommendations  Diet Recommendations: Regular;Thin liquid  Liquid Administration via: Cup;Straw  Medication Administration: Whole meds with liquid  Supervision: Patient able to self feed  Compensations: Multiple dry swallows after each bite/sip  Postural Changes and/or Swallow Maneuvers: Out of bed for meals;Seated upright 90 degrees;Upright 30-60 min after meal  Oral Care Recommendations: Oral care BID  01/2004 Colonoscopy: Dr. Gala Romney IMPRESSION:  1. Internal hemorrhoids otherwise normal rectum.  2. Left sided diverticula.  3. Inflammatory changes involving a 20 cm segment of colon spanning the  splenic flexure most consistent with ischemic or segmental colitis  biopsied. The more proximal colon and terminal ileum appeared normal.  Today's findings explains the patient's presentation. Ischemic or segmental  colitis is almost always a self limiting condition.    Review of Systems see hpi    Past Medical History  Diagnosis Date  . Coronary artery disease   . COPD (chronic obstructive pulmonary disease)   . Diabetes mellitus   . Arthritis   . Hypertension     Past Surgical History  Procedure Laterality Date  . Abdominal hysterectomy    . Lipoma excision      rt anterior abdomen    Allergies  Allergen Reactions  . Bactrim [Sulfamethoxazole-Trimethoprim] Shortness Of Breath and Rash    REACTION: Choking, inability to swallow, redness  .  Levaquin [Levofloxacin] Shortness Of Breath, Rash and Other (See Comments)    Reaction:Choking Brand name Levaquin ok per pt  . Sudafed [Pseudoephedrine Hcl] Shortness Of Breath, Rash and Other (See Comments)    REACTION: Choking, redness, inability to swallow  . Adhesive [Tape] Other (See Comments)    REACTION: redness/irritation at application site. **Certain bandages/adhesives cause this reaction**  . Penicillins Other (See Comments)    REACTION: Faint ("passes out")    Current Outpatient Prescriptions on File Prior to Visit  Medication Sig Dispense Refill  . ALPRAZolam (XANAX) 0.5 MG tablet Take 0.25 mg by mouth every morning.      Marland Kitchen aspirin 81 MG chewable tablet Chew 81 mg by mouth daily.       . Calcium Carbonate-Vitamin D (CALCIUM + D PO) Take 1 tablet by mouth 2 (two) times daily.       . Coenzyme Q10 (COQ10) 200 MG CAPS Take 1 tablet by mouth every morning.      Marland Kitchen CRANBERRY PO Take 1 capsule by mouth 2 (two) times daily. 3600mg       . Docusate Sodium (EQ STOOL SOFTENER PO) Take 1 tablet by mouth at bedtime.      . enalapril (VASOTEC) 5 MG tablet Take 5 mg by mouth every morning.      . escitalopram (LEXAPRO) 20 MG tablet Take 20 mg by mouth every morning.       . fenofibrate 160 MG tablet Take 160 mg by mouth every evening.       . meloxicam (MOBIC) 7.5 MG tablet  Take 7.5 mg by mouth 2 (two) times daily.      . mirtazapine (REMERON) 15 MG tablet Take 15 mg by mouth at bedtime.      . Multiple Vitamin (MULTIVITAMIN WITH MINERALS) TABS Take 1 tablet by mouth every morning.      . nitroGLYCERIN (NITROSTAT) 0.4 MG SL tablet Place 0.4 mg under the tongue every 5 (five) minutes as needed for chest pain. x3 doses as needed for chest pain      . Nutritional Supplements (ESTROVEN PO) Take 1 tablet by mouth every morning.      . Omega-3 Fatty Acids (FISH OIL) 1200 MG CAPS Take 1 capsule by mouth at bedtime.      . pantoprazole (PROTONIX) 40 MG tablet Take 1 tablet (40 mg total) by mouth 2  (two) times daily.  30 tablet  6  . pravastatin (PRAVACHOL) 40 MG tablet Take 1 tablet (40 mg total) by mouth every evening.  30 tablet  10  . tiotropium (SPIRIVA) 18 MCG inhalation capsule Place 18 mcg into inhaler and inhale daily.       No current facility-administered medications on file prior to visit.        Objective:   Physical Exam  Filed Vitals:   02/17/14 1433  BP: 130/80  Pulse: 76  Temp: 98.1 F (36.7 C)  TempSrc: Oral  Resp: 18  Height: 5' (1.524 m)  Weight: 174 lb 1.6 oz (78.971 kg)  Alert and oriented. Skin warm and dry. Oral mucosa is moist.   . Sclera anicteric, conjunctivae is pink. Thyroid not enlarged. No cervical lymphadenopathy. Lungs clear. Heart regular rate and rhythm.  Abdomen is soft. Bowel sounds are positive. No hepatomegaly. No abdominal masses felt. No tenderness. Abdomen is obese.Marland Kitchen No edema to extremities.     Assessment:    Dysphagia. No dysphagia to solids. She occasionally chokes on liquids.     Plan:     Continue the Protonix. Chew food well. OV in 6 months.

## 2014-03-03 ENCOUNTER — Ambulatory Visit: Payer: Medicare Other | Admitting: Cardiology

## 2014-04-15 ENCOUNTER — Encounter: Payer: Self-pay | Admitting: *Deleted

## 2014-04-18 ENCOUNTER — Ambulatory Visit (INDEPENDENT_AMBULATORY_CARE_PROVIDER_SITE_OTHER): Payer: Medicare Other | Admitting: Cardiology

## 2014-04-18 ENCOUNTER — Encounter: Payer: Self-pay | Admitting: Cardiology

## 2014-04-18 VITALS — BP 122/58 | HR 81 | Ht 61.0 in | Wt 169.7 lb

## 2014-04-18 DIAGNOSIS — I219 Acute myocardial infarction, unspecified: Secondary | ICD-10-CM

## 2014-04-18 DIAGNOSIS — E669 Obesity, unspecified: Secondary | ICD-10-CM

## 2014-04-18 DIAGNOSIS — R079 Chest pain, unspecified: Secondary | ICD-10-CM

## 2014-04-18 DIAGNOSIS — I2510 Atherosclerotic heart disease of native coronary artery without angina pectoris: Secondary | ICD-10-CM

## 2014-04-18 DIAGNOSIS — E785 Hyperlipidemia, unspecified: Secondary | ICD-10-CM

## 2014-04-18 DIAGNOSIS — F341 Dysthymic disorder: Secondary | ICD-10-CM

## 2014-04-18 DIAGNOSIS — I10 Essential (primary) hypertension: Secondary | ICD-10-CM

## 2014-04-18 MED ORDER — ISOSORBIDE MONONITRATE ER 30 MG PO TB24: 30.0000 mg | ORAL_TABLET | Freq: Every day | ORAL | Status: DC

## 2014-04-18 NOTE — Progress Notes (Signed)
PATIENT: Vickie [name] MRN: [id number]  DOB: 1946-07-08   DOV:04/20/2014 PCP: Leonides Grills, MD  Clinic Note: Chief Complaint  Patient presents with  . 8 month visit    chest pain in the last 3 days with radiation -use ntg  it helped, no sob, no edema   HPI: Vickie [name] is a 68 y.o.  female with a PMH below who presents today for establishment under cardiology care to return to Dr. Terance Ice. She last saw him in July of 2014. She follows up for chronic chest discomfort that has been evaluated with multiple cardiac catheterizations and stress tests. She is here today accompanied by her oldest daughter who is now healthy after long term health issues with gynecologic surgeries. She had the most recent cath in 2009 showed no significant disease. In 2007 she had an iatrogenic MI related to dissection of the small nondominant RCA. On followup cath in 2009 , the RCA was patent with no residual dissection noted. She had a Myoview in August 2012 for atypical pain as well as in August 2013 for atypical pain that showed normal EF normal perfusion with low risk studies. She has significant anxiety and is troubled by the smallest things that cause her to be extremely upset. 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  Interval History: She has intermittent episodes of chest pain quite frequently. However the most recent episode was this past Monday. The whole episode began on Monday when her daughter were 6 and was having to go driving in the severe range. That got her very upset and scared. She started having chest discomfort and had segment occlusion was once that night. Her chest and came to be sore all date Tuesday. On Wednesday it was made worse again because she was watching the news about or ships and [place] and a complex tear. She has a grandson who is in [place] in could potentially be involved. That got  her completely set and should take another dose of nitroglycerin. Other than that, she had a few occasions in the past few months where she's had segment occlusion. Most if not all these episodes are associated with social stress and not with exertion she is active and as what she would like to do and does not have chest discomfort symptoms. She has mild exertional dyspnea from being deconditioned. He does note occasional palpitations but nothing rapid or causing lightheadedness or dizziness. No syncope or near-syncope. No TIA or amaurosis fugax symptoms. Does have occasional numbness and tingling of her arms associated with chest discomfort. She has what she describes as dyspnea associated with it but is more related to what sounds like hyperventilation.  She denies any melena, hematochezia, hematuria, epistaxis or claudication. She has not had any further emergency room visits since April 2014. Depression atypical chest discomfort radiating to her jaw alleviated with nitroglycerin. Her workup was negative. She is actually doing a relatively good job with her exercise regimen until [employer] that we the [employer]. He is now trying to figure out what other options she would have.  Past Medical History  Diagnosis Date  . COPD (chronic obstructive pulmonary disease)   . Diabetes mellitus   . Arthritis   . Hypertension   . Nonocclusive coronary atherosclerosis of native coronary artery     multi caths 2012, 2006, 2007 2009 (2007 [facility] by catheter-induced dissection  of small nondominant RCA, patent in 2009 with no residual abnormality)  . Hyperlipidemia   . OSA (obstructive sleep apnea) 9/25/ 2012    tested 2009; tetested sleep study 07/2011--titration 09/20/2011 now use Bi-PAP  . Anxiety disorder     With apparent panic attacks    Prior Cardiac Evaluation and Past Surgical History: Past Surgical History  Procedure Laterality Date  . Abdominal hysterectomy     . Lipoma excision      rt anterior abdomen  . Nm myoview ltd  07/2012    (Most recent of many) LOW RISK ; normal EF and normal perfusion , low risk  . Cardiac catheterization  2006,20072009    Non-occlusice CAD - only 80% ostial SP1;  NON DOMINANNT  RCA (catheter insuced dissection with MI in 2007 --> resolved by 2009 cath)  . Doppler echocardiography  01/09/2013    at [facility]---showed moderate LVH, 55% to 65%  with no significant valve disease  . Pulmonary function test  11/19/2007    at [facility],    Allergies  Allergen Reactions  . Bactrim [Sulfamethoxazole-Trimethoprim] Shortness Of Breath and Rash    REACTION: Choking, inability to swallow, redness  . Levaquin [Levofloxacin] Shortness Of Breath, Rash and Other (See Comments)    Reaction:Choking Brand name Levaquin ok per pt  . Mucinex [Guaifenesin Er] Shortness Of Breath and Rash  . Sudafed [Pseudoephedrine Hcl] Shortness Of Breath, Rash and Other (See Comments)    REACTION: Choking, redness, inability to swallow  . Adhesive [Tape] Other (See Comments)    REACTION: redness/irritation at application site. **Certain bandages/adhesives cause this reaction**  . Crestor [Rosuvastatin] Other (See Comments)    LEG ACHING  . Lipitor [Atorvastatin]   . Norvasc [Amlodipine]   . Penicillins Other (See Comments)    REACTION: Faint ("passes out")  . Vytorin [Ezetimibe-Simvastatin]     Current Outpatient Prescriptions  Medication Sig Dispense Refill  . ALPRAZolam (XANAX) 0.5 MG tablet Take 0.25 mg by mouth every morning.      Marland Kitchen aspirin 81 MG chewable tablet Chew 81 mg by mouth daily.       . Calcium Carbonate-Vitamin D (CALCIUM + D PO) Take 1 tablet by mouth 2 (two) times daily.       . Coenzyme Q10 (COQ10) 200 MG CAPS Take 100 mg by mouth every morning.       Marland Kitchen CRANBERRY PO Take 1 capsule by mouth 2 (two) times daily. 3600mg       . Docusate Sodium (EQ STOOL SOFTENER PO) Take 1 tablet by mouth at bedtime.      . enalapril  (VASOTEC) 2.5 MG tablet Take 2.5 mg by mouth daily.      Marland Kitchen escitalopram (LEXAPRO) 20 MG tablet Take 20 mg by mouth every morning.       . fenofibrate 160 MG tablet Take 160 mg by mouth every evening.       . meloxicam (MOBIC) 7.5 MG tablet Take 7.5 mg by mouth 2 (two) times daily.      . mirtazapine (REMERON) 15 MG tablet Take 15 mg by mouth at bedtime.      . Multiple Vitamin (MULTIVITAMIN WITH MINERALS) TABS Take 1 tablet by mouth every morning.      . nitroGLYCERIN (NITROSTAT) 0.4 MG SL tablet Place 0.4 mg under the tongue every 5 (five) minutes as needed for chest pain. x3 doses as needed for chest pain      . Nutritional Supplements (ESTROVEN PO) Take 1 tablet by  mouth every morning.      . Omega-3 Fatty Acids (FISH OIL) 1200 MG CAPS Take 1 capsule by mouth at bedtime.      Marland Kitchen OVER THE COUNTER MEDICATION Hair skin and nail vitamins dily      . pantoprazole (PROTONIX) 40 MG tablet Take 40 mg by mouth daily.      . pravastatin (PRAVACHOL) 40 MG tablet Take 1 tablet (40 mg total) by mouth every evening.  30 tablet  10  . tiotropium (SPIRIVA) 18 MCG inhalation capsule Place 18 mcg into inhaler and inhale daily.      . isosorbide mononitrate (IMDUR) 30 MG 24 hr tablet Take 1 tablet (30 mg total) by mouth daily.  30 tablet  11   No current facility-administered medications for this visit.    History   Social History Narrative   Married mother of 4, grandmother of 71. Her mother is 39 years old. Quit smoking 34 years ago. Does not drink alcohol.  Retired from [employer] in 2012.   Usually presents with oldest daughter.   Previously worked out at [employer] regularly walking 1/2-1 mile a day, but no longer able to do so because of the [employer] decision to no longer cover [employer] cost.    ROS: A comprehensive Review of Systems - Most symptoms are negative is not indicated above. She has not used CPAP for OSA. She has significant anxiety and occasionally some  insomnia. Mild GERD symptoms if she misses a dose of Protonix. No myalgias or arthralgias. No recent illnesses.  PHYSICAL EXAM BP 122/58  Pulse 81  Ht 5\' 1"  (1.549 m)  Wt 169 lb 11.2 oz (76.975 kg)  BMI 32.08 kg/m2 General appearance: Pleasant, relatively mildly obese woman. She is well-nourished and well-groomed. Quite anxious appearing. At the Neck: no adenopathy, no carotid bruit, no JVD and supple, symmetrical, trachea midline Lungs: clear to auscultation bilaterally, normal percussion bilaterally and Nonlabored, good air movement Heart: RRR, normal S1 and S2 clear very soft 1/6 SEM at RUSB. No R./G. Abdomen: soft, non-tender; bowel sounds normal; no masses,  no organomegaly Extremities: extremities normal, atraumatic, no cyanosis or edema; phocomelia with multiple fingers including both thumbs absent Pulses: 2+ and symmetric Neurologic: Alert and oriented X 3, normal strength and tone. Normal symmetric reflexes. Normal coordination and gait   Adult ECG Report  Rate:  81 ;  Rhythm: normal sinus rhythm  Normal voltage, normal axis and intervals;  Conduction Disturbances: none  Other Abnormalities: Nonspecific ST-T abnormalities   Narrative Interpretation:  Stable EKG from previous  Recent Labs:  Latest lipids were from July 2014  TC 192, TG 125, LDL 111, HDL 56  ASSESSMENT / PLAN: Nonocclusive coronary atherosclerosis of native coronary artery She said extensive evaluation of her coronary anatomy with only an 80% ostial septal perforator as the only lesion noted. She has had extensive evaluation of her coronary anatomy with only an 80% ostial septal perforator there was no abnormal. She's had multiple stress tests or nonischemic. I am not inclined to evaluate prolonged episode of chest pain for 3 days we yet another stress test. Fine for cardiac risk modifications she is on statin plus fenofibrate and an ACE inhibitor. I won't start a beta blocker as she does not really have true  cardiac symptoms, and is hoping to get back into her exercise regimen  HEART ATTACK The MI that she had in 2007 was related to catheter-induced RCA dissection. Given this actinic  mishap, I do not think that she will should go back from a cardiac catheterization unless the symptoms are convincing for true angina.  ANXIETY DEPRESSION I think that the description of her recent episode is quite consistent with anxiety driven panic attack type symptoms. I don't think this is anginal at all, however since there is potential relief from nitroglycerin., She may have stress induced coronary spasm. I would consider starting Imdur if she has more episodes. My main recommendation however is for her to take her when necessary anxiolytic and talk with her PCP about starting or increasing her antidepressant.  Obesity (BMI 30-39.9) I counseled her on the importance of getting back into her exercise and monitor her dietary intake.  Hyperlipidemia Monitored by her PCP. Had cramps on Crestor tolerating pravastatin. She is also on fenofibrate and fish oil with coenzyme Q10.    Orders Placed This Encounter  Procedures  . EKG 12-Lead   Meds ordered this encounter  Medications  . enalapril (VASOTEC) 2.5 MG tablet    Sig: Take 2.5 mg by mouth daily.  Marland Kitchen OVER THE COUNTER MEDICATION    Sig: Hair skin and nail vitamins dily  . isosorbide mononitrate (IMDUR) 30 MG 24 hr tablet    Sig: Take 1 tablet (30 mg total) by mouth daily.    Dispense:  30 tablet    Refill:  11    Followup:  6 months  DAVID W. Ellyn Hack, M.D., M.S. Interventional Cardiology [facility]

## 2014-04-18 NOTE — Patient Instructions (Signed)
STATR IMDUR 30 MG DAILY- THE FIRST WEEK 1/2 TABLET  A HALF HOUR AFTER YOU TAKE YOUR ASPIRIN.  MAKE TAKE 1/2 TABLET EXTRA IF YOU HAVE CHEST PIAN LASTING LONGER THAN USUSAL  .Your physician wants you to follow-up in [place].  You will receive a reminder letter in the mail two months in advance. If you don't receive a letter, please call our office to schedule the follow-up appointment.

## 2014-04-19 ENCOUNTER — Encounter: Payer: Self-pay | Admitting: Cardiology

## 2014-04-19 DIAGNOSIS — E669 Obesity, unspecified: Secondary | ICD-10-CM | POA: Insufficient documentation

## 2014-04-19 DIAGNOSIS — I2510 Atherosclerotic heart disease of native coronary artery without angina pectoris: Secondary | ICD-10-CM | POA: Insufficient documentation

## 2014-04-19 DIAGNOSIS — E785 Hyperlipidemia, unspecified: Secondary | ICD-10-CM | POA: Insufficient documentation

## 2014-04-19 DIAGNOSIS — I25119 Atherosclerotic heart disease of native coronary artery with unspecified angina pectoris: Secondary | ICD-10-CM | POA: Insufficient documentation

## 2014-04-19 NOTE — Assessment & Plan Note (Signed)
Monitored by her PCP. Had cramps on Crestor tolerating pravastatin. She is also on fenofibrate and fish oil with coenzyme Q10.

## 2014-04-19 NOTE — Assessment & Plan Note (Signed)
She said extensive evaluation of her coronary anatomy with only an 80% ostial septal perforator as the only lesion noted. She has had extensive evaluation of her coronary anatomy with only an 80% ostial septal perforator there was no abnormal. She's had multiple stress tests or nonischemic. I am not inclined to evaluate prolonged episode of chest pain for 3 days we yet another stress test. Fine for cardiac risk modifications she is on statin plus fenofibrate and an ACE inhibitor. I won't start a beta blocker as she does not really have true cardiac symptoms, and is hoping to get back into her exercise regimen

## 2014-04-19 NOTE — Assessment & Plan Note (Signed)
I counseled her on the importance of getting back into her exercise and monitor her dietary intake.

## 2014-04-19 NOTE — Assessment & Plan Note (Signed)
I think that the description of her recent episode is quite consistent with anxiety driven panic attack type symptoms. I don't think this is anginal at all, however since there is potential relief from nitroglycerin., She may have stress induced coronary spasm. I would consider starting Imdur if she has more episodes. My main recommendation however is for her to take her when necessary anxiolytic and talk with her PCP about starting or increasing her antidepressant.

## 2014-04-19 NOTE — Assessment & Plan Note (Signed)
The MI that she had in 2007 was related to catheter-induced RCA dissection. Given this actinic mishap, I do not think that she will should go back from a cardiac catheterization unless the symptoms are convincing for true angina.

## 2014-07-09 ENCOUNTER — Ambulatory Visit (HOSPITAL_COMMUNITY)
Admission: RE | Admit: 2014-07-09 | Discharge: 2014-07-09 | Disposition: A | Discharge status: Home / Self Care | Payer: Medicare Other | Source: Ambulatory Visit | Attending: Dermatology | Admitting: Dermatology

## 2014-07-09 ENCOUNTER — Other Ambulatory Visit (HOSPITAL_COMMUNITY): Payer: Self-pay | Admitting: Dermatology

## 2014-07-09 DIAGNOSIS — L0291 Cutaneous abscess, unspecified: Secondary | ICD-10-CM | POA: Insufficient documentation

## 2014-07-09 DIAGNOSIS — M19049 Primary osteoarthritis, unspecified hand: Secondary | ICD-10-CM | POA: Insufficient documentation

## 2014-07-09 DIAGNOSIS — S68118A Complete traumatic metacarpophalangeal amputation of other finger, initial encounter: Secondary | ICD-10-CM | POA: Insufficient documentation

## 2014-07-09 DIAGNOSIS — L0390 Cellulitis, unspecified: Principal | ICD-10-CM

## 2014-08-07 ENCOUNTER — Other Ambulatory Visit: Payer: Self-pay | Admitting: *Deleted

## 2014-08-07 MED ORDER — PANTOPRAZOLE SODIUM 40 MG PO TBEC: 40.0000 mg | DELAYED_RELEASE_TABLET | Freq: Every day | ORAL | Status: DC

## 2014-08-18 ENCOUNTER — Encounter (INDEPENDENT_AMBULATORY_CARE_PROVIDER_SITE_OTHER): Payer: Self-pay | Admitting: Internal Medicine

## 2014-08-18 ENCOUNTER — Ambulatory Visit (INDEPENDENT_AMBULATORY_CARE_PROVIDER_SITE_OTHER): Payer: Medicare Other | Admitting: Internal Medicine

## 2014-08-18 VITALS — BP 130/80 | HR 82 | Temp 98.3°F | Resp 18 | Wt 171.0 lb

## 2014-08-18 DIAGNOSIS — K5901 Slow transit constipation: Secondary | ICD-10-CM

## 2014-08-18 DIAGNOSIS — K219 Gastro-esophageal reflux disease without esophagitis: Secondary | ICD-10-CM

## 2014-08-18 DIAGNOSIS — R682 Dry mouth, unspecified: Secondary | ICD-10-CM

## 2014-08-18 DIAGNOSIS — K117 Disturbances of salivary secretion: Secondary | ICD-10-CM

## 2014-08-18 MED ORDER — RANITIDINE HCL 150 MG PO TABS: 150.0000 mg | ORAL_TABLET | Freq: Every day | ORAL | Status: DC | PRN

## 2014-08-18 MED ORDER — DOCUSATE SODIUM 100 MG PO CAPS: 200.0000 mg | ORAL_CAPSULE | Freq: Every day | ORAL | Status: DC

## 2014-08-18 NOTE — Patient Instructions (Signed)
Take pantoprazole by mouth 30 minutes before evening meal. Ranitidine or Zantac 150 mg by mouth at bedtime as needed. Can take Gaviscon OTC on an as-needed basis.

## 2014-08-18 NOTE — Progress Notes (Signed)
Presenting complaint;  Followup for GERD and dysphagia.  Database;  Patient is a 68 year old Caucasian female who is here for scheduled visit. Following her initial visit in November 2014 she underwent barium swallow revealing minimal residual in pyriform sinuses but no evidence of esophageal stricture or delay in passage of barium pill into the stomach. This led to evaluation by Ms. Genene Churn, CCC-SLP. She was felt to have mild pharyngeal phase dysphagia premature spillage of liquids into the vallecular and pyriform space without penetration or aspiration.  First colonoscopy was in 2005 revealing changes suspicious of ischemic colitis(Dr. Rourk). Last colonoscopy was normal in June 2008(Dr.Dan.Ardis Hughs)..  Subjective;  Patient continues to drive him mouth and throat. She continues to have dysphagia primarily with large pills such as fish oil and calcium. She is able to swallow pills with 4 extra sips of water. She has no difficulty with liquids or solids. She has heartburn no more than once every 2-3 weeks. She rarely experiences regurgitation at night. She denies nausea vomiting abdominal pain melena or rectal bleeding. Her bowels move daily or every other day.   Current Medications: Outpatient Encounter Prescriptions as of 08/18/2014  Medication Sig  . ALPRAZolam (XANAX) 0.5 MG tablet Take 0.25 mg by mouth every morning.  Marland Kitchen aspirin 81 MG chewable tablet Chew 81 mg by mouth daily.   . Calcium Carbonate-Vitamin D (CALCIUM + D PO) Take 1 tablet by mouth 2 (two) times daily.   . Coenzyme Q10 (COQ10) 200 MG CAPS Take 100 mg by mouth every morning.   Marland Kitchen CRANBERRY PO Take 1 capsule by mouth 2 (two) times daily. 3600mg   . Docusate Sodium (EQ STOOL SOFTENER PO) Take 1 tablet by mouth at bedtime.  . enalapril (VASOTEC) 2.5 MG tablet Take 2.5 mg by mouth daily.  Marland Kitchen escitalopram (LEXAPRO) 20 MG tablet Take 20 mg by mouth every morning.   . fenofibrate 160 MG tablet Take 160 mg by mouth every  evening.   . isosorbide mononitrate (IMDUR) 30 MG 24 hr tablet Take 1 tablet (30 mg total) by mouth daily.  . meloxicam (MOBIC) 7.5 MG tablet Take 7.5 mg by mouth 2 (two) times daily.  . mirtazapine (REMERON) 15 MG tablet Take 15 mg by mouth at bedtime.  . Multiple Vitamin (MULTIVITAMIN WITH MINERALS) TABS Take 1 tablet by mouth every morning.  . nitroGLYCERIN (NITROSTAT) 0.4 MG SL tablet Place 0.4 mg under the tongue every 5 (five) minutes as needed for chest pain. x3 doses as needed for chest pain  . Nutritional Supplements (ESTROVEN PO) Take 1 tablet by mouth every morning.  . Omega-3 Fatty Acids (FISH OIL) 1200 MG CAPS Take 1 capsule by mouth at bedtime.  Marland Kitchen OVER THE COUNTER MEDICATION Hair skin and nail vitamins dily  . pantoprazole (PROTONIX) 40 MG tablet Take 1 tablet (40 mg total) by mouth daily.  . pravastatin (PRAVACHOL) 40 MG tablet Take 1 tablet (40 mg total) by mouth every evening.  . tiotropium (SPIRIVA) 18 MCG inhalation capsule Place 18 mcg into inhaler and inhale daily.     Objective: Blood pressure 130/80, pulse 82, temperature 98.3 F (36.8 C), temperature source Oral, resp. rate 18, weight 171 lb (77.565 kg). Asian is alert and in no acute distress. Conjunctiva is pink. Sclera is nonicteric Oropharyngeal mucosa is normal. No neck masses or thyromegaly noted. Cardiac exam with regular rhythm normal S1 and S2. No murmur or gallop noted. Lungs are clear to auscultation. Abdomen is full but soft and nontender without organomegaly or  masses. No LE edema or clubbing noted. 2 fingers and right hand are rudimentary and one finger is missing and left hand. Slight deformity and scar noted at left mid leg.     Assessment:  #1. Xerostomia. This symptom is secondary to her medication(Spiriva, alprazolam mirtazapine and escitalopram). She needs to use hard candy if tolerated or drink sips of water frequently. #2. Pill dysphagia.She is not having any difficulty with solids. BPE  in November last year was negative for esophageal stricture. She needs to find alternative pills which are smaller in diameter. #3. GERD. Heartburn is well controlled with therapy.    Plan:   Take pantoprazole 40 mg by mouth 30 minutes before evening meal daily. Ranitidine or Zantac OTC 150 mg by mouth each bedtime as needed. Gaviscon OTC 2 tablets daily when necessary.  Office visit in 6 months.

## 2014-09-03 ENCOUNTER — Other Ambulatory Visit: Payer: Self-pay | Admitting: Cardiovascular Disease

## 2014-09-03 NOTE — Telephone Encounter (Signed)
Rx was sent to pharmacy electronically. 

## 2014-10-11 ENCOUNTER — Emergency Department (HOSPITAL_COMMUNITY): Payer: Medicare Other

## 2014-10-11 ENCOUNTER — Encounter (HOSPITAL_COMMUNITY): Payer: Self-pay | Admitting: Emergency Medicine

## 2014-10-11 ENCOUNTER — Emergency Department (HOSPITAL_COMMUNITY)
Admission: EM | Admit: 2014-10-11 | Discharge: 2014-10-11 | Disposition: A | Discharge status: Home / Self Care | Payer: Medicare Other | Attending: Emergency Medicine | Admitting: Emergency Medicine

## 2014-10-11 DIAGNOSIS — I10 Essential (primary) hypertension: Secondary | ICD-10-CM | POA: Insufficient documentation

## 2014-10-11 DIAGNOSIS — Z791 Long term (current) use of non-steroidal anti-inflammatories (NSAID): Secondary | ICD-10-CM | POA: Diagnosis not present

## 2014-10-11 DIAGNOSIS — M1990 Unspecified osteoarthritis, unspecified site: Secondary | ICD-10-CM | POA: Diagnosis not present

## 2014-10-11 DIAGNOSIS — Z7982 Long term (current) use of aspirin: Secondary | ICD-10-CM | POA: Insufficient documentation

## 2014-10-11 DIAGNOSIS — I2510 Atherosclerotic heart disease of native coronary artery without angina pectoris: Secondary | ICD-10-CM | POA: Diagnosis not present

## 2014-10-11 DIAGNOSIS — J449 Chronic obstructive pulmonary disease, unspecified: Secondary | ICD-10-CM | POA: Diagnosis not present

## 2014-10-11 DIAGNOSIS — E785 Hyperlipidemia, unspecified: Secondary | ICD-10-CM | POA: Diagnosis not present

## 2014-10-11 DIAGNOSIS — Z9889 Other specified postprocedural states: Secondary | ICD-10-CM | POA: Insufficient documentation

## 2014-10-11 DIAGNOSIS — Z79899 Other long term (current) drug therapy: Secondary | ICD-10-CM | POA: Insufficient documentation

## 2014-10-11 DIAGNOSIS — F419 Anxiety disorder, unspecified: Secondary | ICD-10-CM | POA: Diagnosis not present

## 2014-10-11 DIAGNOSIS — R0789 Other chest pain: Secondary | ICD-10-CM | POA: Diagnosis not present

## 2014-10-11 DIAGNOSIS — E119 Type 2 diabetes mellitus without complications: Secondary | ICD-10-CM | POA: Diagnosis not present

## 2014-10-11 DIAGNOSIS — Z87891 Personal history of nicotine dependence: Secondary | ICD-10-CM | POA: Diagnosis not present

## 2014-10-11 DIAGNOSIS — G4733 Obstructive sleep apnea (adult) (pediatric): Secondary | ICD-10-CM | POA: Diagnosis not present

## 2014-10-11 DIAGNOSIS — Z9981 Dependence on supplemental oxygen: Secondary | ICD-10-CM | POA: Insufficient documentation

## 2014-10-11 DIAGNOSIS — Z880 Allergy status to penicillin: Secondary | ICD-10-CM | POA: Insufficient documentation

## 2014-10-11 DIAGNOSIS — Z8719 Personal history of other diseases of the digestive system: Secondary | ICD-10-CM | POA: Diagnosis not present

## 2014-10-11 DIAGNOSIS — R079 Chest pain, unspecified: Secondary | ICD-10-CM | POA: Diagnosis present

## 2014-10-11 DIAGNOSIS — R0781 Pleurodynia: Secondary | ICD-10-CM

## 2014-10-11 MED ORDER — HYDROCODONE-ACETAMINOPHEN 5-325 MG PO TABS: 1.0000 | ORAL_TABLET | Freq: Four times a day (QID) | ORAL | Status: DC | PRN

## 2014-10-11 NOTE — Discharge Instructions (Signed)
As discussed, there is no evidence for fracture on her x-rays today.  Your pain is likely due to sprain or strain of the chest wall muscles.  Please followup with your physician for appropriate ongoing management.  Return here for concerning changes in your condition.  Chest Wall Pain Chest wall pain is pain in or around the bones and muscles of your chest. It may take up to 6 weeks to get better. It may take longer if you must stay physically active in your work and activities.  CAUSES  Chest wall pain may happen on its own. However, it may be caused by:  A viral illness like the flu.  Injury.  Coughing.  Exercise.  Arthritis.  Fibromyalgia.  Shingles. HOME CARE INSTRUCTIONS   Avoid overtiring physical activity. Try not to strain or perform activities that cause pain. This includes any activities using your chest or your abdominal and side muscles, especially if heavy weights are used.  Put ice on the sore area.  Put ice in a plastic bag.  Place a towel between your skin and the bag.  Leave the ice on for 15-20 minutes per hour while awake for the first 2 days.  Only take over-the-counter or prescription medicines for pain, discomfort, or fever as directed by your caregiver. SEEK IMMEDIATE MEDICAL CARE IF:   Your pain increases, or you are very uncomfortable.  You have a fever.  Your chest pain becomes worse.  You have new, unexplained symptoms.  You have nausea or vomiting.  You feel sweaty or lightheaded.  You have a cough with phlegm (sputum), or you cough up blood. MAKE SURE YOU:   Understand these instructions.  Will watch your condition.  Will get help right away if you are not doing well or get worse. Document Released: 12/12/2005 Document Revised: 03/05/2012 Document Reviewed: 08/08/2011 [facility] Patient Information 2015 [place], [place]. This information is not intended to replace advice given to you by your health care provider. Make sure you  discuss any questions you have with your health care provider.

## 2014-10-11 NOTE — ED Notes (Signed)
MD at bedside. 

## 2014-10-11 NOTE — ED Notes (Signed)
PT was bending over yesterday cleaning at her home and felt a pop to her left rib cage and she thinks she pulled a muscle. PT states she feels a knot on her left rib cage. PT denies any chest pain or SOB. PT ambulatory in triage.

## 2014-10-11 NOTE — ED Provider Notes (Signed)
CSN: [id number]     Arrival date & time 10/11/14  1125 History  This chart was scribed for Carmin Muskrat, MD by Molli Posey, ED Scribe. This patient was seen in room APA05/APA05 and the patient's care was started 11:41 AM.    Chief Complaint  Patient presents with  . Illegal value: [    rib pain     The history is provided by the patient. No language interpreter was used.   HPI Comments: Vickie [name] is a 68 y.o. female with a history of DM, and COPD who presents to the Emergency Department complaining of pain in her left lower ribs where she thinks she pulled a muscle when cleaning her freezer late yesterday. She reports she felt a pop in the area and states she can feel a knot on her left lower rib cage currently. She denies any falls or other injuries. She reports the only medication she took yesterday was aspirin. She denies any chest pain or SOB as symptoms.    Past Medical History  Diagnosis Date  . COPD (chronic obstructive pulmonary disease)   . Diabetes mellitus   . Arthritis   . Hypertension   . Nonocclusive coronary atherosclerosis of native coronary artery     multi caths 2012, 2006, 2007 2009 (2007 [facility] by catheter-induced dissection of small nondominant RCA, patent in 2009 with no residual abnormality)  . Hyperlipidemia   . OSA (obstructive sleep apnea) 9/25/ 2012    tested 2009; tetested sleep study 07/2011--titration 09/20/2011 now use Bi-PAP  . Anxiety disorder     With apparent panic attacks  . Hiatal hernia    Past Surgical History  Procedure Laterality Date  . Abdominal hysterectomy    . Lipoma excision      rt anterior abdomen  . Nm myoview ltd  07/2012    (Most recent of many) LOW RISK ; normal EF and normal perfusion , low risk  . Cardiac catheterization  2006,20072009    Non-occlusice CAD - only 80% ostial SP1;  NON DOMINANNT  RCA (catheter insuced dissection with MI in 2007 --> resolved by 2009 cath)  . Doppler echocardiography  01/09/2013     at [facility]---showed moderate LVH, 55% to 65%  with no significant valve disease  . Pulmonary function test  11/19/2007    at [facility],   No family history on file. History  Substance Use Topics  . Smoking status: Former Smoker    Quit date: 04/18/1977  . Smokeless tobacco: Never Used  . Alcohol Use: No   OB History   Grav Para Term Preterm Abortions TAB SAB Ect Mult Living                 Review of Systems  Constitutional:       Per HPI, otherwise negative  HENT:       Per HPI, otherwise negative  Respiratory: Negative for shortness of breath.        Per HPI, otherwise negative  Cardiovascular: Negative for chest pain.       Per HPI, otherwise negative  Gastrointestinal: Negative for vomiting.  Endocrine:       Negative aside from HPI  Genitourinary:       Neg aside from HPI   Musculoskeletal: Positive for arthralgias.       Per HPI, otherwise negative  Skin: Negative.   Neurological: Negative for syncope.      Allergies  Bactrim; Levaquin; Mucinex; Sudafed; Adhesive; Crestor; Lipitor; Norvasc; Penicillins;  and Vytorin  Home Medications   Prior to Admission medications   Medication Sig Start Date End Date Taking? Authorizing Provider  ALPRAZolam Duanne Moron) 0.5 MG tablet Take 0.25 mg by mouth every morning.    Historical Provider, MD  aspirin 81 MG chewable tablet Chew 81 mg by mouth daily.     Historical Provider, MD  Calcium Carbonate-Vitamin D (CALCIUM + D PO) Take 1 tablet by mouth 2 (two) times daily.     Historical Provider, MD  Coenzyme Q10 (COQ10) 200 MG CAPS Take 100 mg by mouth every morning.     Historical Provider, MD  CRANBERRY PO Take 1 capsule by mouth 2 (two) times daily. 3600mg     Historical Provider, MD  docusate sodium (EQUATE STOOL SOFTENER) 100 MG capsule Take 2 capsules (200 mg total) by mouth daily. 08/18/14   Rogene Houston, MD  enalapril (VASOTEC) 2.5 MG tablet Take 2.5 mg by mouth daily.    Historical Provider, MD  escitalopram  (LEXAPRO) 20 MG tablet Take 20 mg by mouth every morning.     Historical Provider, MD  fenofibrate 160 MG tablet Take 160 mg by mouth every evening.     Historical Provider, MD  isosorbide mononitrate (IMDUR) 30 MG 24 hr tablet Take 1 tablet (30 mg total) by mouth daily. 04/18/14   Leonie Man, MD  meloxicam (MOBIC) 7.5 MG tablet Take 7.5 mg by mouth 2 (two) times daily.    Historical Provider, MD  mirtazapine (REMERON) 15 MG tablet Take 15 mg by mouth at bedtime.    Historical Provider, MD  Multiple Vitamin (MULTIVITAMIN WITH MINERALS) TABS Take 1 tablet by mouth every morning.    Historical Provider, MD  nitroGLYCERIN (NITROSTAT) 0.4 MG SL tablet Place 0.4 mg under the tongue every 5 (five) minutes as needed for chest pain. x3 doses as needed for chest pain    Historical Provider, MD  Nutritional Supplements (ESTROVEN PO) Take 1 tablet by mouth every morning.    Historical Provider, MD  Omega-3 Fatty Acids (FISH OIL) 1200 MG CAPS Take 1 capsule by mouth at bedtime.    Historical Provider, MD  OVER THE COUNTER MEDICATION Hair skin and nail vitamins dily    Historical Provider, MD  pantoprazole (PROTONIX) 40 MG tablet Take 1 tablet (40 mg total) by mouth daily. 08/07/14   Lorretta Harp, MD  pravastatin (PRAVACHOL) 40 MG tablet TAKE ONE TABLET BY MOUTH DAILY. 09/03/14   Lorretta Harp, MD  ranitidine (ZANTAC) 150 MG tablet Take 1 tablet (150 mg total) by mouth daily as needed for heartburn. 08/18/14   Rogene Houston, MD  tiotropium (SPIRIVA) 18 MCG inhalation capsule Place 18 mcg into inhaler and inhale daily.    Historical Provider, MD   BP 164/69  Pulse 75  Temp(Src) 98.1 F (36.7 C) (Oral)  Resp 20  Ht 5\' 1"  (1.549 m)  Wt 172 lb (78.019 kg)  BMI 32.52 kg/m2  SpO2 97%  Physical Exam  Nursing note and vitals reviewed. Constitutional: She is oriented to person, place, and time. She appears well-developed and well-nourished. No distress.  HENT:  Head: Normocephalic and atraumatic.   Eyes: Conjunctivae and EOM are normal.  Cardiovascular: Normal rate and regular rhythm.   Pulmonary/Chest: Effort normal and breath sounds normal. No stridor. No respiratory distress.  Abdominal: She exhibits no distension.  Musculoskeletal: She exhibits no edema.  Left anterior inferior 10/11  Neurological: She is alert and oriented to person, place, and time. No cranial  nerve deficit.  Skin: Skin is warm and dry.  Psychiatric: She has a normal mood and affect.    ED Course  Procedures  DIAGNOSTIC STUDIES: Oxygen Saturation is 97% on RA, normal by my interpretation.    COORDINATION OF CARE: 11:45 AM Discussed treatment plan with pt at bedside and pt agreed to plan.    Imaging Review Dg Chest 2 View  10/11/2014   CLINICAL DATA:  Left anterior chest pain since an injury 10/11/2014.  EXAM: CHEST  2 VIEW  COMPARISON:  PA and lateral chest 12/10/2012 and 02/07/2014.  FINDINGS: The chest is hyperexpanded with attenuation of the pulmonary vasculature. There is no consolidative process, pneumothorax or effusion. Heart size is normal. No focal bony abnormality is identified with multilevel spondylosis noted.  IMPRESSION: Emphysema without acute disease.   Electronically Signed   By: Inge Rise M.D.   On: 10/11/2014 12:37   Dg Ribs Unilateral Left  10/11/2014   CLINICAL DATA:  Left lower rib pain after an injury yesterday.  EXAM: LEFT RIBS - 2 VIEW  COMPARISON:  None.  FINDINGS: No fracture or other bone lesions are seen involving the ribs.  IMPRESSION: Negative exam.   Electronically Signed   By: Inge Rise M.D.   On: 10/11/2014 12:38     MDM   Final diagnoses:  Rib pain on left side    I personally performed the services described in this documentation, which was scribed in my presence. The recorded information has been reviewed and is accurate.   Patient presents one day after sustaining an injury to her chest wall. No evidence for fracture, pneumonia,  pneumothorax. Patient improved here with analgesia, was discharged with similar medication to follow up with primary care  Carmin Muskrat, MD 10/11/14 1245

## 2014-12-03 ENCOUNTER — Encounter (INDEPENDENT_AMBULATORY_CARE_PROVIDER_SITE_OTHER): Payer: Self-pay | Admitting: *Deleted

## 2014-12-16 ENCOUNTER — Ambulatory Visit (INDEPENDENT_AMBULATORY_CARE_PROVIDER_SITE_OTHER): Payer: Medicare Other | Admitting: Cardiology

## 2014-12-16 ENCOUNTER — Encounter: Payer: Self-pay | Admitting: Cardiology

## 2014-12-16 VITALS — BP 120/64 | HR 78 | Ht 61.0 in | Wt 171.3 lb

## 2014-12-16 DIAGNOSIS — E785 Hyperlipidemia, unspecified: Secondary | ICD-10-CM

## 2014-12-16 DIAGNOSIS — I10 Essential (primary) hypertension: Secondary | ICD-10-CM

## 2014-12-16 DIAGNOSIS — I2510 Atherosclerotic heart disease of native coronary artery without angina pectoris: Secondary | ICD-10-CM

## 2014-12-16 DIAGNOSIS — F341 Dysthymic disorder: Secondary | ICD-10-CM

## 2014-12-16 DIAGNOSIS — E669 Obesity, unspecified: Secondary | ICD-10-CM

## 2014-12-16 NOTE — Patient Instructions (Signed)
No change in medications  TAKE TIME TO DO SOMETHING FOR YOU!  Your physician wants you to follow-up in 6 MONTHS Dr Ellyn Hack. [place].  You will receive a reminder letter in the mail two months in advance. If you don't receive a letter, please call our office to schedule the follow-up appointment.

## 2014-12-17 ENCOUNTER — Encounter: Payer: Self-pay | Admitting: Cardiology

## 2014-12-17 ENCOUNTER — Telehealth: Payer: Self-pay | Admitting: Cardiology

## 2014-12-17 MED ORDER — NITROGLYCERIN 0.4 MG SL SUBL: 0.4000 mg | SUBLINGUAL_TABLET | SUBLINGUAL | Status: DC | PRN

## 2014-12-17 NOTE — Assessment & Plan Note (Signed)
Well-controlled on low-dose of enalapril.

## 2014-12-17 NOTE — Assessment & Plan Note (Signed)
She has had extensive cardiac evaluation in the past. With no real change in symptoms, I'm not going to pursue any invasive or noninvasive evaluation for now. I will just continue with Imdur as well as ACE inhibitor and statin.  She is not on beta blocker, I read in the past was probably related to fatigue and exacerbation of COPD.

## 2014-12-17 NOTE — Telephone Encounter (Signed)
Pt called in needing a refill on her NTG sent to [facility] in [place]

## 2014-12-17 NOTE — Assessment & Plan Note (Signed)
Switched over to pravastatin from Crestor. She is also on fenofibrate. Last lipid levels were pretty well controlled. No further myalgias

## 2014-12-17 NOTE — Assessment & Plan Note (Signed)
I get portion of her symptoms are related to her anxiety and if not depression at least dysthymia. She is on Lexapro with Xanax. I really think that the majority of her symptoms are psychotropic in nature. A good portion of the interview was related to essentially psychological counseling. At least 10 minutes.

## 2014-12-17 NOTE — Assessment & Plan Note (Signed)
The patient understands the need to lose weight with diet and exercise. We have discussed specific strategies for this. I basically told her that she needs to take time for herself to get out and get some exercise. If she does not take care of herself, she cannot care for her family.

## 2014-12-17 NOTE — Telephone Encounter (Signed)
Rx(s) sent to pharmacy electronically. Called patient - no current chest pain - but states she has a feeling she may need this over the holidays.  She forgot to ask for refill during OV yesterday.

## 2014-12-17 NOTE — Progress Notes (Signed)
PATIENT: Vickie [name] MRN: [id number]  DOB: May 22, 1946   DOV:12/17/2014 PCP: Purvis Kilts, MD  Clinic Note: Chief Complaint  Patient presents with  . Follow-up    6 month follow up, Pt. is experiencing chest pressure, pain, SOB, and swelling.   HPI: Vickie [name] is a 68 y.o.  female former patient of Dr. Terance Ice, whom I initially saw in April 2015 for the first time.   She has been a long-standing patient of Dr. Lowella Fairy with complaints of chronic intermittent chest pain. She has had several cardiac catheterizations and stress test. 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 Otherwise no significant coronary disease. Follow-up cath in 2009 showed resolution of the dissection and no further disease. She had a Myoview in August 2012 for atypical pain as well as in August 2013 for atypical pain that showed normal EF normal perfusion with low risk studies. She has significant anxiety and is troubled by the smallest things that cause her to be extremely upset. 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  Interval History: She continues to have intermittent episodes of chest pain quite frequently that or not necessarily associated with any particular activity. Made worse with stress or anxiety. They did get notably improved when we started Imdur.  No PND, orthopnea or edema. She still notes occasional palpitations but nothing rapid or causing lightheadedness or dizziness. No syncope or near-syncope. No TIA or amaurosis fugax symptoms. Does have occasional numbness and tingling of her arms associated with chest discomfort. She has what she describes as dyspnea associated with it but is more related to what sounds like hyperventilation.  She has not been able to do the exercise like she would like to do  mostly because she is having to be the caregiver for her husband and mother.,   Past Medical History  Diagnosis Date  . COPD (chronic obstructive pulmonary disease)     PFTs were done in 2008 at [facility]  . Diabetes mellitus type II, non insulin dependent   . Arthritis   . Essential hypertension   . Nonocclusive coronary atherosclerosis of native coronary artery 2006 through 2012    multi caths 2012, 2006, [id number] (2993 complicated by catheter-induced dissection of small nondominant RCA, patent in 2009 with no residual abnormality); Myoview August 2013: LOW RISK, normal EF. ; Echocardiogram [name] - January 2014) moderate LVH, EF 55-65%. No significant valvular disease.  Marland Kitchen Hyperlipidemia   . OSA (obstructive sleep apnea) 9/25/ 2012    tested 2009; tetested sleep study 07/2011--titration 09/20/2011 now use Bi-PAP  . Anxiety disorder     With apparent panic attacks  . Hiatal hernia    Allergies reviewed in epic. Extensive list.   Current Outpatient Prescriptions  Medication Sig Dispense Refill  . albuterol (PROVENTIL) (2.5 MG/3ML) 0.083% nebulizer solution Take 2.5 mg by nebulization as needed for wheezing or shortness of breath.    . ALPRAZolam (XANAX) 0.5 MG tablet Take 0.25 mg by mouth every morning.    . Calcium Carbonate-Vitamin D (CALCIUM + D PO) Take 1 tablet by mouth 2 (two) times daily.     . Coenzyme Q10 (COQ10) 200 MG CAPS Take 100 mg by mouth every morning.     Marland Kitchen CRANBERRY PO Take 1 capsule by mouth 2 (two) times daily. 3600mg     .  enalapril (VASOTEC) 2.5 MG tablet Take 2.5 mg by mouth daily.    Marland Kitchen escitalopram (LEXAPRO) 20 MG tablet Take 20 mg by mouth every morning.     . fenofibrate 160 MG tablet Take 160 mg by mouth every evening.     Marland Kitchen HYDROcodone-acetaminophen (NORCO/VICODIN) 5-325 MG per tablet Take 1 tablet by mouth every 6 (six) hours as needed for severe pain. 15 tablet 0  . isosorbide mononitrate (IMDUR) 30 MG 24 hr tablet Take 1 tablet (30 mg total) by  mouth daily. 30 tablet 11  . meloxicam (MOBIC) 7.5 MG tablet Take 7.5 mg by mouth 2 (two) times daily.    . mirtazapine (REMERON) 15 MG tablet Take 15 mg by mouth at bedtime.    . Multiple Vitamin (MULTIVITAMIN WITH MINERALS) TABS Take 1 tablet by mouth every morning.    . Multiple Vitamin (MULTIVITAMIN) tablet Take 1 tablet by mouth daily.    . Nutritional Supplements (ESTROVEN PO) Take 1 tablet by mouth every morning.    . Omega-3 Fatty Acids (FISH OIL) 1200 MG CAPS Take 1 capsule by mouth at bedtime.    Marland Kitchen OVER THE COUNTER MEDICATION Hair skin and nail vitamins dily    . pantoprazole (PROTONIX) 40 MG tablet Take 1 tablet (40 mg total) by mouth daily. 30 tablet 6  . pravastatin (PRAVACHOL) 40 MG tablet TAKE ONE TABLET BY MOUTH DAILY. 30 tablet 7  . tiotropium (SPIRIVA) 18 MCG inhalation capsule Place 18 mcg into inhaler and inhale daily.    . nitroGLYCERIN (NITROSTAT) 0.4 MG SL tablet Place 1 tablet (0.4 mg total) under the tongue every 5 (five) minutes as needed for chest pain. Max 3 doses. 25 tablet 3   No current facility-administered medications for this visit.   No Significant Change to Social and Family History.  ROS: A comprehensive Review of Systems - was performed. Review of Systems  Constitutional: Negative for malaise/fatigue.  HENT: Negative for nosebleeds.   Respiratory: Positive for cough (Intermittent cough) and shortness of breath (Intermittently he gets short of breath with doing routine activities, but not every time she does something. Worse when stressed.). Negative for sputum production and wheezing.   Cardiovascular: Positive for chest pain (intermittent short spells), palpitations (Intermittent. Not lasting long) and leg swelling (Mild edema of the day). Negative for claudication and PND.  Gastrointestinal: Positive for heartburn (If she misses her PPI) and abdominal pain. Negative for nausea, vomiting, blood in stool and melena.  Genitourinary: Negative for hematuria.   Musculoskeletal: Positive for joint pain (knees and ankles  as well as hands). Negative for myalgias.       Has occasionally had pretty significant lower family pain. This past weekend after being up on her feet and walking for longer time she had severe pain along the right lower extremity from the foot THE knee in the anterior portion. Also the back of the knee. He got better with ice and elevation.  Neurological: Positive for dizziness (Positional). Negative for sensory change, speech change, focal weakness, seizures and loss of consciousness.  Endo/Heme/Allergies: Does not bruise/bleed easily.  Psychiatric/Behavioral: Negative for memory loss. The patient is nervous/anxious and has insomnia (due to stress;, has not been using CPAP).        Significant social stress with caring for her 42 year old mother who is trying to "hold on through Christmas". Her husband is also sick and requiring hands-on care.    PHYSICAL EXAM BP 120/64 mmHg  Pulse 78  Ht 5\' 1"  (1.549 m)  Wt 171 lb 4.8 oz (77.701 kg)  BMI 32.38 kg/m2 General appearance: Pleasant, mildly obese woman. She is well-nourished and well-groomed. Quite anxious appearing; becomes tearful with discussing her mother's health   Neck: no adenopathy, no carotid bruit, no JVD and supple, symmetrical, trachea midline Lungs: clear to auscultation bilaterally, normal percussion bilaterally and Nonlabored, good air movement Heart: RRR, normal S1 and S2 clear very soft 1/6 SEM at RUSB. No R./G. Abdomen: soft, non-tender; bowel sounds normal; no masses,  no organomegaly Extremities: extremities normal, atraumatic, no cyanosis or edema; phocomelia with multiple fingers including both thumbs absent Pulses: 2+ and symmetric Neurologic: Alert and oriented X 3, normal strength and tone. Normal symmetric reflexes. Normal coordination and gait   Adult ECG Report  Rate:   78;  Rhythm: normal sinus rhythm  Normal voltage, normal axis and intervals;   Conduction Disturbances: none  Other Abnormalities: Nonspecific ST-T abnormalities  Narrative Interpretation:  Stable EKG from previous  Recent Labs:  Latest lipids were fro10/08/2014  TC 201 , T117 , LD108 , HDL  69    CBC with H&H of 13.4/39.7.  Chemistry panel normal. Creatinine 0.78  ASSESSMENT / PLAN: Overall stable from a cardiac standpoint. She has intermittent chest discomfort that probably is not cardiac in nature. However she does say that there is improvement with taking Imdur. There may be some coronary spasm component.    Nonocclusive coronary atherosclerosis of native coronary artery She has had extensive cardiac evaluation in the past. With no real change in symptoms, I'm not going to pursue any invasive or noninvasive evaluation for now. I will just continue with Imdur as well as ACE inhibitor and statin.  She is not on beta blocker, I read in the past was probably related to fatigue and exacerbation of COPD.  Essential hypertension Well-controlled on low-dose of enalapril.  Hyperlipidemia Switched over to pravastatin from Crestor. She is also on fenofibrate. Last lipid levels were pretty well controlled. No further myalgias  Obesity (BMI 30-39.9) The patient understands the need to lose weight with diet and exercise. We have discussed specific strategies for this. I basically told her that she needs to take time for herself to get out and get some exercise. If she does not take care of herself, she cannot care for her family.  ANXIETY DEPRESSION I get portion of her symptoms are related to her anxiety and if not depression at least dysthymia. She is on Lexapro with Xanax. I really think that the majority of her symptoms are psychotropic in nature. A good portion of the interview was related to essentially psychological counseling. At least 10 minutes.    Orders Placed This Encounter  Procedures  . EKG 12-Lead   Meds ordered this encounter  Medications  .  albuterol (PROVENTIL) (2.5 MG/3ML) 0.083% nebulizer solution    Sig: Take 2.5 mg by nebulization as needed for wheezing or shortness of breath.  . Multiple Vitamin (MULTIVITAMIN) tablet    Sig: Take 1 tablet by mouth daily.    with extensive counseling, I spent at least 25-30 minutes with the patient.  Followup:  6 months  DAVID W. Ellyn Hack, M.D., M.S. Interventional Cardiology [facility]

## 2014-12-31 ENCOUNTER — Other Ambulatory Visit: Payer: Self-pay

## 2014-12-31 DIAGNOSIS — Z1231 Encounter for screening mammogram for malignant neoplasm of breast: Secondary | ICD-10-CM

## 2015-01-09 ENCOUNTER — Ambulatory Visit
Admission: RE | Admit: 2015-01-09 | Discharge: 2015-01-09 | Disposition: A | Discharge status: Home / Self Care | Payer: Medicare Other | Source: Ambulatory Visit

## 2015-01-09 DIAGNOSIS — Z1231 Encounter for screening mammogram for malignant neoplasm of breast: Secondary | ICD-10-CM

## 2015-02-18 ENCOUNTER — Ambulatory Visit (INDEPENDENT_AMBULATORY_CARE_PROVIDER_SITE_OTHER): Payer: Medicare Other | Admitting: Internal Medicine

## 2015-03-26 ENCOUNTER — Telehealth: Payer: Self-pay | Admitting: Cardiology

## 2015-03-28 ENCOUNTER — Other Ambulatory Visit: Payer: Self-pay | Admitting: Cardiovascular Disease

## 2015-03-30 NOTE — Telephone Encounter (Signed)
Rx(s) sent to pharmacy electronically.  

## 2015-03-30 NOTE — Telephone Encounter (Signed)
Close encounter 

## 2015-05-13 ENCOUNTER — Other Ambulatory Visit: Payer: Self-pay | Admitting: Cardiology

## 2015-05-13 NOTE — Telephone Encounter (Signed)
Rx(s) sent to pharmacy electronically.  

## 2015-06-15 NOTE — Progress Notes (Signed)
PATIENT: Vickie [name] MRN: [id number]  DOB: 01/25/1946   DOV:06/18/2015 PCP: Purvis Kilts, MD  Clinic Note: Chief Complaint  Patient presents with  . Follow-up    6 month; has chest pain, pressure and tightness since last visit, shortness of breath-has copd, has edema in left leg, has pain in legs, has cramping in legs, has lightheadedness, no dizziness   HPI: Vickie [name] is a 69 y.o.  female former patient of Dr. Terance Ice, whom I initially saw in April 2015 for the first time.   She was a long-standing patient of Dr. Lowella Fairy with complaints of chronic intermittent chest pain. She has had several cardiac catheterizations and stress test. 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 Otherwise no significant coronary disease. Follow-up cath in 2009 showed resolution of the dissection and no further disease. She had a Myoviews in August 2012& August 2013 for atypical pain that showed normal EF normal perfusion with low risk studies. She has significant anxiety and is troubled by the smallest things that cause her to be extremely upset -- extremely stressed out by her family. 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  Interval History: She continues to have intermittent episodes of chest pain quite frequently that or not necessarily associated with any particular activity. Often, these spells are made worse with stress or anxiety. They did get notably improved when we started Imdur.  No PND, orthopnea or edema. She still notes occasional palpitations but nothing rapid or causing lightheadedness or dizziness. No syncope or near-syncope. No TIA or amaurosis fugax symptoms. Does have occasional numbness and tingling of her arms associated with chest discomfort. She has what she describes as dyspnea  associated with it but is more related to what sounds like hyperventilation. She does note having an episode earlier this week of tightness in her chest that felt it went up into her neck and throat. This discomfort was associated initially with a headache and dyspnea. At the time she also felt somewhat dizzy and that her heart rate increased.  She has not been able to do the exercise like she would like to do mostly because she is having to be the caregiver for her husband and previously her now deceased mother. She is hoping to try to get her to exercise, but has not had sufficient time.  Past Medical History  Diagnosis Date  . COPD (chronic obstructive pulmonary disease)     PFTs were done in 2008 at [facility]  . Diabetes mellitus type II, non insulin dependent   . Arthritis   . Essential hypertension   . Nonocclusive coronary atherosclerosis of native coronary artery 2006 through 2012    multi caths 2012, 2006, [id number] ([id number] complicated by catheter-induced dissection of small nondominant RCA, patent in 2009 with no residual abnormality); Myoview August 2013: LOW RISK, normal EF. ; Echocardiogram [name] - January 2014) moderate LVH, EF 55-65%. No significant valvular disease.  Marland Kitchen Hyperlipidemia   . OSA (obstructive sleep apnea) 9/25/ 2012    tested 2009; tetested sleep study 07/2011--titration 09/20/2011 now use Bi-PAP  . Anxiety disorder     With apparent panic attacks  . Hiatal hernia    Allergies  Allergen Reactions  . Bactrim [Sulfamethoxazole-Trimethoprim] Shortness Of Breath and Rash    REACTION: Choking, inability to swallow, redness  .  Levaquin [Levofloxacin] Shortness Of Breath, Rash and Other (See Comments)    Reaction:Choking Brand name Levaquin ok per pt  . Mucinex [Guaifenesin Er] Shortness Of Breath and Rash  . Sudafed [Pseudoephedrine Hcl] Shortness Of Breath, Rash and Other (See Comments)    REACTION: Choking, redness, inability to swallow  . Adhesive [Tape]  Other (See Comments)    REACTION: redness/irritation at application site. **Certain bandages/adhesives cause this reaction**  . Crestor [Rosuvastatin] Other (See Comments)    LEG ACHING  . Lipitor [Atorvastatin]   . Norvasc [Amlodipine]   . Penicillins Other (See Comments)    REACTION: Faint ("passes out")  . Vytorin [Ezetimibe-Simvastatin]     Current Outpatient Prescriptions  Medication Sig Dispense Refill  . albuterol (PROVENTIL) (2.5 MG/3ML) 0.083% nebulizer solution Take 2.5 mg by nebulization as needed for wheezing or shortness of breath.    . ALPRAZolam (XANAX) 0.5 MG tablet Take 0.25 mg by mouth every morning.    . busPIRone (BUSPAR) 7.5 MG tablet Take 7.5 mg by mouth 2 (two) times daily.    . Calcium Carbonate-Vitamin D (CALCIUM + D PO) Take 1 tablet by mouth 2 (two) times daily.     . Coenzyme Q10 (COQ10) 200 MG CAPS Take 100 mg by mouth every morning.     Marland Kitchen CRANBERRY PO Take 1 capsule by mouth 2 (two) times daily. 3600mg     . enalapril (VASOTEC) 2.5 MG tablet Take 2.5 mg by mouth daily.    Marland Kitchen escitalopram (LEXAPRO) 20 MG tablet Take 20 mg by mouth every morning.     . isosorbide mononitrate (IMDUR) 30 MG 24 hr tablet TAKE ONE TABLET BY MOUTH DAILY. 30 tablet 6  . meloxicam (MOBIC) 7.5 MG tablet Take 7.5 mg by mouth 2 (two) times daily.    . mirtazapine (REMERON) 15 MG tablet Take 15 mg by mouth at bedtime.    . Multiple Vitamin (MULTIVITAMIN) tablet Take 1 tablet by mouth daily.    . nitroGLYCERIN (NITROSTAT) 0.4 MG SL tablet Place 1 tablet (0.4 mg total) under the tongue every 5 (five) minutes as needed for chest pain. Max 3 doses. 25 tablet 3  . Omega-3 Fatty Acids (FISH OIL) 1200 MG CAPS Take 1 capsule by mouth at bedtime.    Marland Kitchen OVER THE COUNTER MEDICATION Hair skin and nail vitamins dily    . pantoprazole (PROTONIX) 40 MG tablet TAKE ONE TABLET BY MOUTH ONCE DAILY. 30 tablet 8  . tiotropium (SPIRIVA) 18 MCG inhalation capsule Place 18 mcg into inhaler and inhale daily.    .  pravastatin (PRAVACHOL) 40 MG tablet Take 1 tablet (40 mg total) by mouth every evening. 90 tablet 3   No current facility-administered medications for this visit.  she was told stop/hold her pravastatin While on Protonix.  Social History   reports that she quit smoking about 38 years ago. She has never used smokeless tobacco. She reports that she does not drink alcohol or use illicit drugs.  -- significant social stressors with lots of trauma at home. Recently lost her mother in February at just under 89 user age. This was after complications of surgery complicated by Septicemia.  This is lead to arguments between her youngest daughter and son.  Family History History reviewed. No pertinent family history. no  Premature CAD  ROS: A comprehensive Review of Systems - was performed. Review of Systems  Constitutional: Negative for malaise/fatigue.  HENT: Negative for nosebleeds.   Respiratory: Positive for cough (Intermittent cough) and shortness of breath (  Intermittently he gets short of breath with doing routine activities, but not every time she does something. Worse when stressed.). Negative for sputum production and wheezing.   Cardiovascular: Positive for chest pain (intermittent short spells), palpitations (Intermittent. Not lasting long) and leg swelling (Mild edema of the day). Negative for claudication (Unclear the symptoms that she described and would like/calf cramping is related to claudication or pseudo--claudication) and PND.  Gastrointestinal: Positive for heartburn (If she misses her PPI) and abdominal pain. Negative for nausea, vomiting, blood in stool and melena.  Genitourinary: Negative for hematuria.  Musculoskeletal: Positive for joint pain (knees and ankles  as well as hands). Negative for myalgias.       Has occasionally had pretty significant lower extremity pain. This past weekend after being up on her feet and walking for longer time she had severe pain along the right  lower extremity from the foot up to the knee in the anterior portion. Also the back of the knee. This pain got better with ice and elevation.  Neurological: Positive for dizziness (Positional). Negative for sensory change, speech change, focal weakness, seizures and loss of consciousness.  Endo/Heme/Allergies: Does not bruise/bleed easily.  Psychiatric/Behavioral: Negative for memory loss. The patient is nervous/anxious and has insomnia (due to stress;, has not been using CPAP).        Significant social stress with caring for her 72 year old mother who is trying to "hold on through Christmas". Her husband is also sick and requiring hands-on care.  All other systems reviewed and are negative.   Wt Readings from Last 3 Encounters:  06/16/15 77.792 kg (171 lb 8 oz)  12/16/14 77.701 kg (171 lb 4.8 oz)  10/11/14 78.019 kg (172 lb)    PHYSICAL EXAM BP 138/80 mmHg  Pulse 62  Ht 5\' 1"  (1.549 m)  Wt 77.792 kg (171 lb 8 oz)  BMI 32.42 kg/m2 General appearance: Pleasant, mildly obese woman. She is well-nourished and well-groomed. Quite anxious appearing; becomes tearful with discussing her mother's health   Neck: no adenopathy, no carotid bruit, no JVD and supple, symmetrical, trachea midline Lungs: clear to auscultation bilaterally, normal percussion bilaterally and Nonlabored, good air movement Heart: RRR, normal S1 and S2 clear very soft 1/6 SEM at RUSB. No R./G. Abdomen: soft, non-tender; bowel sounds normal; no masses,  no organomegaly Extremities: extremities normal, atraumatic, no cyanosis or edema; phocomelia with multiple fingers including both thumbs absent Pulses: 2+ and symmetric Neurologic: Alert and oriented X 3, normal strength and tone. Normal symmetric reflexes. Normal coordination and gait   Adult ECG Report  Rate:  62;  Rhythm: normal sinus rhythm; Normal EKG  Normal voltage, normal axis and intervals;   Narrative Interpretation:  Stable EKG from previous  Recent Labs:   Latest lipids were from 10/03/2014 -> checked in April by PCP (not available) Lab Results  Component Value Date   CHOL 192 07/08/2013   HDL 56 07/08/2013   LDLCALC 111* 07/08/2013   TRIG 125 07/08/2013   CHOLHDL 3.4 07/08/2013     ASSESSMENT / PLAN: Overall stable from a cardiac standpoint. She has intermittent chest discomfort that probably is not cardiac in nature. However she does say that there is improvement with taking Imdur. There may be some coronary spasm component.   Problem List Items Addressed This Visit    Chest pain with moderate risk for cardiac etiology - Primary    Prescription and chest discomfort has some classic and some atypical type symptoms for angina. At least partially  in order to isolate her fears, I think we can try doing a minor shift this. Of course it not sure she can actually do the treadmill portion with her possible claudication symptoms.   Plan: Lexiscan Myoview      Relevant Orders   EKG 12-Lead (Completed)   LE ARTERIAL DUPLEX   Myocardial Perfusion Imaging   Claudication (Chronic)    She does note some symptoms which could be concerning for claudication. He certainly has risk factors. My suspicion is that this probably was neuropathic symptoms. She has not had arterial Dopplers done any time soon. Plan is to get lower extremity arterial Dopplers.      Relevant Orders   EKG 12-Lead (Completed)   LE ARTERIAL DUPLEX   Myocardial Perfusion Imaging   Essential hypertension (Chronic)    Borderline control on what appears to be Imdur and enalapril      Relevant Medications   pravastatin (PRAVACHOL) 40 MG tablet   nitroGLYCERIN (NITROSTAT) 0.4 MG SL tablet   Hyperlipidemia (Chronic)    Now no longer on a stronger statin. She is also on fenofibrate. Relatively good control from August 2015 notes.      Relevant Medications   pravastatin (PRAVACHOL) 40 MG tablet   nitroGLYCERIN (NITROSTAT) 0.4 MG SL tablet   Nonocclusive coronary  atherosclerosis of native coronary artery (Chronic)    She has had an extensive evaluation in the past. Her symptoms are atypical in nature and similar to previous complaints. Better with Imdur suggesting possible vasospasm mediated symptoms. She is branch interested in trying to get into some type of exercise routine and is scared to do so with her having this symptom of pain. I will do more concerned with the fact that it is made worse with exertion than the more atypical symptoms in the past. She certainly has cardiac history.  I would like to assure her and her deal forward with her exercise.also in light of the recent losses in her family, a stress test will help her have less morbid thoughts..  Plan: Lexiscan Myoview. She is quite sure that she would not do a walk  On treadmill.      Relevant Medications   pravastatin (PRAVACHOL) 40 MG tablet   nitroGLYCERIN (NITROSTAT) 0.4 MG SL tablet   Obesity (BMI 30-39.9) (Chronic)    The patient understands the need to lose weight with diet and exercise. We have discussed specific strategies for this.         YOU MAY TAKE PROTONIX ( STOMACH )AND PRAVASTATIN (PRAVACHOL - CHOLESTEROL. DO NOT TAKE AT THE SAME TIME.  Orders Placed This Encounter  Procedures  . Myocardial Perfusion Imaging    Standing Status: Future     Number of Occurrences:      Standing Expiration Date: 06/15/2016    Order Specific Question:  Where should this test be performed    Answer:  [facility]    Order Specific Question:  Type of stress    Answer:  Lexiscan    Order Specific Question:  Patient weight in lbs    Answer:  171  . EKG 12-Lead   Meds ordered this encounter  Medications  . busPIRone (BUSPAR) 7.5 MG tablet    Sig: Take 7.5 mg by mouth 2 (two) times daily.  . pravastatin (PRAVACHOL) 40 MG tablet    Sig: Take 1 tablet (40 mg total) by mouth every evening.    Dispense:  90 tablet    Refill:  3  .  nitroGLYCERIN (NITROSTAT) 0.4 MG SL tablet     Sig: Place 1 tablet (0.4 mg total) under the tongue every 5 (five) minutes as needed for chest pain. Max 3 doses.    Dispense:  25 tablet    Refill:  3    with extensive counseling, I spent at least 25-30 minutes with the patient.  Followup:  6 months  Ryane Konieczny W. Ellyn Hack, M.D., M.S. Interventional Cardiology [facility]   Lexi Nuc LEA Dopplers OK to take PPI + Pravachol   Nonocclusive coronary atherosclerosis of native coronary artery She has had extensive cardiac evaluation in the past. With no real change in symptoms, I'm not going to pursue any invasive or noninvasive evaluation for now. I will just continue with Imdur as well as ACE inhibitor and statin.  She is not on beta blocker, I read in the past was probably related to fatigue and exacerbation of COPD.  Essential hypertension Well-controlled on low-dose of enalapril.  Hyperlipidemia Switched over to pravastatin from Crestor. She is also on fenofibrate. Last lipid levels were pretty well controlled. No further myalgias  Obesity (BMI 30-39.9) The patient understands the need to lose weight with diet and exercise. We have discussed specific strategies for this. I basically told her that she needs to take time for herself to get out and get some exercise. If she does not take care of herself, she cannot care for her family.  ANXIETY DEPRESSION I get portion of her symptoms are related to her anxiety and if not depression at least dysthymia. She is on Lexapro with Xanax. I really think that the majority of her symptoms are psychotropic in nature. A good portion of the interview was related to essentially psychological counseling. At least 10 minutes.

## 2015-06-16 ENCOUNTER — Ambulatory Visit (INDEPENDENT_AMBULATORY_CARE_PROVIDER_SITE_OTHER): Payer: Medicare Other | Admitting: Cardiology

## 2015-06-16 ENCOUNTER — Encounter: Payer: Self-pay | Admitting: Cardiology

## 2015-06-16 VITALS — BP 138/80 | HR 62 | Ht 61.0 in | Wt 171.5 lb

## 2015-06-16 DIAGNOSIS — I10 Essential (primary) hypertension: Secondary | ICD-10-CM | POA: Diagnosis not present

## 2015-06-16 DIAGNOSIS — I739 Peripheral vascular disease, unspecified: Secondary | ICD-10-CM | POA: Diagnosis not present

## 2015-06-16 DIAGNOSIS — I2510 Atherosclerotic heart disease of native coronary artery without angina pectoris: Secondary | ICD-10-CM

## 2015-06-16 DIAGNOSIS — I214 Non-ST elevation (NSTEMI) myocardial infarction: Secondary | ICD-10-CM

## 2015-06-16 DIAGNOSIS — R079 Chest pain, unspecified: Secondary | ICD-10-CM

## 2015-06-16 DIAGNOSIS — E785 Hyperlipidemia, unspecified: Secondary | ICD-10-CM

## 2015-06-16 DIAGNOSIS — E669 Obesity, unspecified: Secondary | ICD-10-CM

## 2015-06-16 MED ORDER — NITROGLYCERIN 0.4 MG SL SUBL: 0.4000 mg | SUBLINGUAL_TABLET | SUBLINGUAL | Status: DC | PRN

## 2015-06-16 MED ORDER — PRAVASTATIN SODIUM 40 MG PO TABS: 40.0000 mg | ORAL_TABLET | Freq: Every evening | ORAL | Status: DC

## 2015-06-16 NOTE — Patient Instructions (Signed)
Your physician has requested that you have a lower  extremity arterial duplex FOR CLAUDICATIONS. This test is an ultrasound of the arteries in the legs. It looks at arterial blood flow in the leg. Allow one hour for Lower Arterial scans. There are no restrictions or special instructions  Your physician has requested that you have a lexiscan myoview. For further information please visit [email or web address]. Please follow instruction sheet, as given.   YOU MAY TAKE PROTONIX ( STOMACH )AND PRAVASTATIN (PRAVACHOL - CHOLESTEROL. DO NOT TAKE AT THE SAME TIME.   Your physician wants you to follow-up in [place]. (IF TEST IS ABNORMAL WILL MAKE AN APPOINTMENT SOONER IF NEEDED.)  You will receive a reminder letter in the mail two months in advance. If you don't receive a letter, please call our office to schedule the follow-up appointment.

## 2015-06-18 ENCOUNTER — Encounter: Payer: Self-pay | Admitting: Cardiology

## 2015-06-18 DIAGNOSIS — I209 Angina pectoris, unspecified: Secondary | ICD-10-CM | POA: Insufficient documentation

## 2015-06-18 DIAGNOSIS — I739 Peripheral vascular disease, unspecified: Secondary | ICD-10-CM | POA: Insufficient documentation

## 2015-06-18 NOTE — Assessment & Plan Note (Signed)
The patient understands the need to lose weight with diet and exercise. We have discussed specific strategies for this.  

## 2015-06-18 NOTE — Assessment & Plan Note (Signed)
Borderline control on what appears to be Imdur and enalapril

## 2015-06-18 NOTE — Assessment & Plan Note (Signed)
She has had an extensive evaluation in the past. Her symptoms are atypical in nature and similar to previous complaints. Better with Imdur suggesting possible vasospasm mediated symptoms. She is branch interested in trying to get into some type of exercise routine and is scared to do so with her having this symptom of pain. I will do more concerned with the fact that it is made worse with exertion than the more atypical symptoms in the past. She certainly has cardiac history.  I would like to assure her and her deal forward with her exercise.also in light of the recent losses in her family, a stress test will help her have less morbid thoughts..  Plan: Lexiscan Myoview. She is quite sure that she would not do a walk  On treadmill.

## 2015-06-18 NOTE — Assessment & Plan Note (Signed)
She does note some symptoms which could be concerning for claudication. He certainly has risk factors. My suspicion is that this probably was neuropathic symptoms. She has not had arterial Dopplers done any time soon. Plan is to get lower extremity arterial Dopplers.

## 2015-06-18 NOTE — Assessment & Plan Note (Signed)
Now no longer on a stronger statin. She is also on fenofibrate. Relatively good control from August 2015 notes.

## 2015-06-18 NOTE — Assessment & Plan Note (Signed)
Prescription and chest discomfort has some classic and some atypical type symptoms for angina. At least partially in order to isolate her fears, I think we can try doing a minor shift this. Of course it not sure she can actually do the treadmill portion with her possible claudication symptoms.   Plan: [employer]

## 2015-06-24 ENCOUNTER — Other Ambulatory Visit: Payer: Self-pay | Admitting: Cardiology

## 2015-06-24 DIAGNOSIS — I739 Peripheral vascular disease, unspecified: Secondary | ICD-10-CM

## 2015-06-25 ENCOUNTER — Telehealth (HOSPITAL_COMMUNITY): Payer: Self-pay

## 2015-06-25 NOTE — Telephone Encounter (Signed)
Encounter complete. 

## 2015-06-30 ENCOUNTER — Ambulatory Visit (HOSPITAL_COMMUNITY)
Admission: RE | Admit: 2015-06-30 | Discharge: 2015-06-30 | Disposition: A | Discharge status: Home / Self Care | Payer: Medicare Other | Source: Ambulatory Visit | Attending: Cardiovascular Disease | Admitting: Cardiovascular Disease

## 2015-06-30 ENCOUNTER — Ambulatory Visit (HOSPITAL_BASED_OUTPATIENT_CLINIC_OR_DEPARTMENT_OTHER)
Admission: RE | Admit: 2015-06-30 | Discharge: 2015-06-30 | Disposition: A | Discharge status: Home / Self Care | Payer: Medicare Other | Source: Ambulatory Visit | Attending: Cardiology | Admitting: Cardiology

## 2015-06-30 DIAGNOSIS — R079 Chest pain, unspecified: Secondary | ICD-10-CM | POA: Diagnosis not present

## 2015-06-30 DIAGNOSIS — I739 Peripheral vascular disease, unspecified: Secondary | ICD-10-CM | POA: Diagnosis not present

## 2015-06-30 LAB — MYOCARDIAL PERFUSION IMAGING
CHL CUP NUCLEAR SSS: 5
CHL CUP RESTING HR STRESS: 51 {beats}/min
LV dias vol: 75 mL
LVSYSVOL: 29 mL
Peak HR: 85 {beats}/min
SDS: 4
SRS: 1
TID: 1.29

## 2015-06-30 MED ORDER — AMINOPHYLLINE 25 MG/ML IV SOLN
75.0000 mg | Freq: Once | INTRAVENOUS | Status: AC
  Administered 2015-06-30: 75 mg via INTRAVENOUS

## 2015-06-30 MED ORDER — TECHNETIUM TC 99M SESTAMIBI GENERIC - CARDIOLITE
10.6000 | Freq: Once | INTRAVENOUS | Status: AC | PRN
  Administered 2015-06-30: 11 via INTRAVENOUS

## 2015-06-30 MED ORDER — REGADENOSON 0.4 MG/5ML IV SOLN
0.4000 mg | Freq: Once | INTRAVENOUS | Status: AC
  Administered 2015-06-30: 0.4 mg via INTRAVENOUS

## 2015-06-30 MED ORDER — TECHNETIUM TC 99M SESTAMIBI GENERIC - CARDIOLITE
30.1000 | Freq: Once | INTRAVENOUS | Status: AC | PRN
  Administered 2015-06-30: 30.1 via INTRAVENOUS

## 2015-07-01 ENCOUNTER — Telehealth: Payer: Self-pay | Admitting: *Deleted

## 2015-07-01 NOTE — Telephone Encounter (Signed)
LEFT MESSAGE ON VOICE TO CALL BACK

## 2015-07-01 NOTE — Telephone Encounter (Signed)
Spoke to patient. Result given . Verbalized understanding  

## 2015-07-01 NOTE — Telephone Encounter (Signed)
-----   Message from Leonie Man, MD sent at 07/01/2015 12:20 AM EDT ----- Low risk nuclear stress test with apical thinning, but no evidence of infarct or ischemia. Normal ejection fraction of 61%.  Good news. Nothing to explain chest tightness or pressure.  HARDING, Leonie Green, MD  Pls forward to  PCP -- Sharilyn Sites, MD

## 2015-08-20 ENCOUNTER — Encounter (HOSPITAL_COMMUNITY): Payer: Self-pay | Admitting: *Deleted

## 2015-08-20 ENCOUNTER — Emergency Department (HOSPITAL_COMMUNITY): Payer: Medicare Other

## 2015-08-20 ENCOUNTER — Observation Stay (HOSPITAL_COMMUNITY)
Admission: EM | Admit: 2015-08-20 | Discharge: 2015-08-21 | Disposition: A | Discharge status: Home / Self Care | Payer: Medicare Other | Attending: Internal Medicine | Admitting: Internal Medicine

## 2015-08-20 DIAGNOSIS — E119 Type 2 diabetes mellitus without complications: Secondary | ICD-10-CM | POA: Diagnosis not present

## 2015-08-20 DIAGNOSIS — E785 Hyperlipidemia, unspecified: Secondary | ICD-10-CM | POA: Diagnosis not present

## 2015-08-20 DIAGNOSIS — R079 Chest pain, unspecified: Secondary | ICD-10-CM | POA: Diagnosis not present

## 2015-08-20 DIAGNOSIS — R200 Anesthesia of skin: Secondary | ICD-10-CM | POA: Insufficient documentation

## 2015-08-20 DIAGNOSIS — I10 Essential (primary) hypertension: Secondary | ICD-10-CM | POA: Diagnosis not present

## 2015-08-20 DIAGNOSIS — E038 Other specified hypothyroidism: Secondary | ICD-10-CM | POA: Diagnosis not present

## 2015-08-20 DIAGNOSIS — K449 Diaphragmatic hernia without obstruction or gangrene: Secondary | ICD-10-CM | POA: Insufficient documentation

## 2015-08-20 DIAGNOSIS — J449 Chronic obstructive pulmonary disease, unspecified: Secondary | ICD-10-CM | POA: Insufficient documentation

## 2015-08-20 DIAGNOSIS — Z79899 Other long term (current) drug therapy: Secondary | ICD-10-CM | POA: Diagnosis not present

## 2015-08-20 DIAGNOSIS — Z87891 Personal history of nicotine dependence: Secondary | ICD-10-CM | POA: Insufficient documentation

## 2015-08-20 DIAGNOSIS — Z23 Encounter for immunization: Secondary | ICD-10-CM | POA: Insufficient documentation

## 2015-08-20 DIAGNOSIS — F419 Anxiety disorder, unspecified: Secondary | ICD-10-CM | POA: Insufficient documentation

## 2015-08-20 DIAGNOSIS — Z880 Allergy status to penicillin: Secondary | ICD-10-CM | POA: Diagnosis not present

## 2015-08-20 DIAGNOSIS — G4733 Obstructive sleep apnea (adult) (pediatric): Secondary | ICD-10-CM | POA: Diagnosis not present

## 2015-08-20 DIAGNOSIS — E039 Hypothyroidism, unspecified: Secondary | ICD-10-CM | POA: Diagnosis present

## 2015-08-20 DIAGNOSIS — M1990 Unspecified osteoarthritis, unspecified site: Secondary | ICD-10-CM | POA: Insufficient documentation

## 2015-08-20 DIAGNOSIS — I25119 Atherosclerotic heart disease of native coronary artery with unspecified angina pectoris: Secondary | ICD-10-CM | POA: Diagnosis present

## 2015-08-20 DIAGNOSIS — I2510 Atherosclerotic heart disease of native coronary artery without angina pectoris: Secondary | ICD-10-CM | POA: Diagnosis present

## 2015-08-20 DIAGNOSIS — F341 Dysthymic disorder: Secondary | ICD-10-CM | POA: Diagnosis present

## 2015-08-20 LAB — COMPREHENSIVE METABOLIC PANEL
ALT: 23 U/L (ref 14–54)
ANION GAP: 7 (ref 5–15)
AST: 19 U/L (ref 15–41)
Albumin: 4.1 g/dL (ref 3.5–5.0)
Alkaline Phosphatase: 54 U/L (ref 38–126)
BILIRUBIN TOTAL: 0.7 mg/dL (ref 0.3–1.2)
BUN: 15 mg/dL (ref 6–20)
CO2: 28 mmol/L (ref 22–32)
Calcium: 9.8 mg/dL (ref 8.9–10.3)
Chloride: 104 mmol/L (ref 101–111)
Creatinine, Ser: 0.78 mg/dL (ref 0.44–1.00)
Glucose, Bld: 105 mg/dL — ABNORMAL HIGH (ref 65–99)
POTASSIUM: 4.2 mmol/L (ref 3.5–5.1)
Sodium: 139 mmol/L (ref 135–145)
TOTAL PROTEIN: 6.7 g/dL (ref 6.5–8.1)

## 2015-08-20 LAB — CBC WITH DIFFERENTIAL/PLATELET
BASOS ABS: 0 10*3/uL (ref 0.0–0.1)
BASOS PCT: 0 % (ref 0–1)
Eosinophils Absolute: 0.1 10*3/uL (ref 0.0–0.7)
Eosinophils Relative: 2 % (ref 0–5)
HCT: 44.1 % (ref 36.0–46.0)
Hemoglobin: 15 g/dL (ref 12.0–15.0)
Lymphocytes Relative: 31 % (ref 12–46)
Lymphs Abs: 2.4 10*3/uL (ref 0.7–4.0)
MCH: 30.2 pg (ref 26.0–34.0)
MCHC: 34 g/dL (ref 30.0–36.0)
MCV: 88.9 fL (ref 78.0–100.0)
Monocytes Absolute: 0.7 10*3/uL (ref 0.1–1.0)
Monocytes Relative: 9 % (ref 3–12)
NEUTROS ABS: 4.5 10*3/uL (ref 1.7–7.7)
Neutrophils Relative %: 58 % (ref 43–77)
Platelets: 183 10*3/uL (ref 150–400)
RBC: 4.96 MIL/uL (ref 3.87–5.11)
RDW: 12.5 % (ref 11.5–15.5)
WBC: 7.7 10*3/uL (ref 4.0–10.5)

## 2015-08-20 LAB — URINALYSIS, ROUTINE W REFLEX MICROSCOPIC
BILIRUBIN URINE: NEGATIVE
Glucose, UA: NEGATIVE mg/dL
Hgb urine dipstick: NEGATIVE
Ketones, ur: NEGATIVE mg/dL
Leukocytes, UA: NEGATIVE
NITRITE: NEGATIVE
Protein, ur: NEGATIVE mg/dL
Specific Gravity, Urine: 1.01 (ref 1.005–1.030)
UROBILINOGEN UA: 0.2 mg/dL (ref 0.0–1.0)
pH: 6.5 (ref 5.0–8.0)

## 2015-08-20 LAB — GLUCOSE, CAPILLARY: Glucose-Capillary: 120 mg/dL — ABNORMAL HIGH (ref 65–99)

## 2015-08-20 LAB — TROPONIN I

## 2015-08-20 LAB — TSH: TSH: 1.721 u[IU]/mL (ref 0.350–4.500)

## 2015-08-20 MED ORDER — TIOTROPIUM BROMIDE MONOHYDRATE 18 MCG IN CAPS
18.0000 ug | ORAL_CAPSULE | Freq: Every day | RESPIRATORY_TRACT | Status: DC
  Administered 2015-08-21: 18 ug via RESPIRATORY_TRACT
  Filled 2015-08-20: qty 5

## 2015-08-20 MED ORDER — ENOXAPARIN SODIUM 40 MG/0.4ML ~~LOC~~ SOLN
40.0000 mg | SUBCUTANEOUS | Status: DC
  Administered 2015-08-20: 40 mg via SUBCUTANEOUS
  Filled 2015-08-20: qty 0.4

## 2015-08-20 MED ORDER — ALBUTEROL SULFATE (2.5 MG/3ML) 0.083% IN NEBU: 2.5000 mg | INHALATION_SOLUTION | RESPIRATORY_TRACT | Status: DC | PRN

## 2015-08-20 MED ORDER — ACETAMINOPHEN 325 MG PO TABS
650.0000 mg | ORAL_TABLET | Freq: Once | ORAL | Status: AC
  Administered 2015-08-20: 650 mg via ORAL
  Filled 2015-08-20: qty 2

## 2015-08-20 MED ORDER — ACETAMINOPHEN 650 MG RE SUPP
650.0000 mg | Freq: Four times a day (QID) | RECTAL | Status: DC | PRN
Start: 2015-08-20 — End: 2015-08-21

## 2015-08-20 MED ORDER — INFLUENZA VAC SPLIT QUAD 0.5 ML IM SUSY
0.5000 mL | PREFILLED_SYRINGE | INTRAMUSCULAR | Status: AC
  Administered 2015-08-21: 0.5 mL via INTRAMUSCULAR
  Filled 2015-08-20: qty 0.5

## 2015-08-20 MED ORDER — ACETAMINOPHEN 325 MG PO TABS
650.0000 mg | ORAL_TABLET | Freq: Four times a day (QID) | ORAL | Status: DC | PRN
  Administered 2015-08-20: 650 mg via ORAL
  Filled 2015-08-20: qty 2

## 2015-08-20 MED ORDER — ESCITALOPRAM OXALATE 10 MG PO TABS
20.0000 mg | ORAL_TABLET | Freq: Every morning | ORAL | Status: DC
  Administered 2015-08-21: 20 mg via ORAL
  Filled 2015-08-20: qty 2

## 2015-08-20 MED ORDER — ENALAPRIL MALEATE 5 MG PO TABS
2.5000 mg | ORAL_TABLET | Freq: Every day | ORAL | Status: DC
  Administered 2015-08-21: 2.5 mg via ORAL
  Filled 2015-08-20 (×2): qty 1

## 2015-08-20 MED ORDER — BUSPIRONE HCL 5 MG PO TABS
7.5000 mg | ORAL_TABLET | Freq: Two times a day (BID) | ORAL | Status: DC
  Administered 2015-08-20 – 2015-08-21 (×2): 7.5 mg via ORAL
  Filled 2015-08-20 (×2): qty 2

## 2015-08-20 MED ORDER — ALPRAZOLAM 0.25 MG PO TABS
0.2500 mg | ORAL_TABLET | Freq: Every morning | ORAL | Status: DC
  Administered 2015-08-21: 0.25 mg via ORAL
  Filled 2015-08-20: qty 1

## 2015-08-20 MED ORDER — SODIUM CHLORIDE 0.9 % IV SOLN: 250.0000 mL | INTRAVENOUS | Status: DC | PRN

## 2015-08-20 MED ORDER — SODIUM CHLORIDE 0.9 % IJ SOLN: 3.0000 mL | Freq: Two times a day (BID) | INTRAMUSCULAR | Status: DC

## 2015-08-20 MED ORDER — SENNOSIDES-DOCUSATE SODIUM 8.6-50 MG PO TABS: 1.0000 | ORAL_TABLET | Freq: Every evening | ORAL | Status: DC | PRN

## 2015-08-20 MED ORDER — SODIUM CHLORIDE 0.9 % IJ SOLN
3.0000 mL | Freq: Two times a day (BID) | INTRAMUSCULAR | Status: DC
  Administered 2015-08-20: 3 mL via INTRAVENOUS

## 2015-08-20 MED ORDER — SODIUM CHLORIDE 0.9 % IJ SOLN: 3.0000 mL | INTRAMUSCULAR | Status: DC | PRN

## 2015-08-20 MED ORDER — MIRTAZAPINE 15 MG PO TABS
15.0000 mg | ORAL_TABLET | Freq: Every day | ORAL | Status: DC
  Administered 2015-08-20: 15 mg via ORAL
  Filled 2015-08-20: qty 1

## 2015-08-20 MED ORDER — ISOSORBIDE MONONITRATE ER 60 MG PO TB24
30.0000 mg | ORAL_TABLET | Freq: Every day | ORAL | Status: DC
  Administered 2015-08-21: 30 mg via ORAL
  Filled 2015-08-20 (×2): qty 1

## 2015-08-20 MED ORDER — ADULT MULTIVITAMIN W/MINERALS CH
1.0000 | ORAL_TABLET | Freq: Every day | ORAL | Status: DC
  Administered 2015-08-21: 1 via ORAL
  Filled 2015-08-20 (×2): qty 1

## 2015-08-20 MED ORDER — OMEGA-3-ACID ETHYL ESTERS 1 G PO CAPS
1.0000 g | ORAL_CAPSULE | Freq: Every day | ORAL | Status: DC
  Administered 2015-08-20 – 2015-08-21 (×2): 1 g via ORAL
  Filled 2015-08-20 (×2): qty 1

## 2015-08-20 MED ORDER — ONDANSETRON HCL 4 MG/2ML IJ SOLN: 4.0000 mg | Freq: Four times a day (QID) | INTRAMUSCULAR | Status: DC | PRN

## 2015-08-20 MED ORDER — NITROGLYCERIN 0.4 MG SL SUBL: 0.4000 mg | SUBLINGUAL_TABLET | SUBLINGUAL | Status: DC | PRN

## 2015-08-20 MED ORDER — ONDANSETRON HCL 4 MG PO TABS: 4.0000 mg | ORAL_TABLET | Freq: Four times a day (QID) | ORAL | Status: DC | PRN

## 2015-08-20 MED ORDER — PANTOPRAZOLE SODIUM 40 MG PO TBEC
40.0000 mg | DELAYED_RELEASE_TABLET | Freq: Every day | ORAL | Status: DC
  Administered 2015-08-20 – 2015-08-21 (×2): 40 mg via ORAL
  Filled 2015-08-20 (×3): qty 1

## 2015-08-20 MED ORDER — PRAVASTATIN SODIUM 40 MG PO TABS
40.0000 mg | ORAL_TABLET | Freq: Every evening | ORAL | Status: DC
  Administered 2015-08-20: 40 mg via ORAL
  Filled 2015-08-20: qty 1

## 2015-08-20 NOTE — ED Provider Notes (Signed)
CSN: [id number]     Arrival date & time 08/20/15  1111 History  This chart was scribe for [place]* by Judithann Sauger, ED Scribe. The patient was seen in room APA02/APA02 and the patient's care was started at 11:30 AM.    Chief Complaint  Patient presents with  . Chest Pain   The history is provided by the patient. No language interpreter was used.   HPI Comments: Vickie [name] is a 69 y.o. female who presents to the Emergency Department complaining of a possible stroke onset yesterday. She reports associated CP, facial numbness, lighthededness, photophobia, and HA. Pt denies that she has had dizziness in the past. Her daughter reports similar previous symptoms when she has CP. She adds that the pt took a Nitro at home overnight. She states that her mother is under stress since her mother's death anniversary is in a couple of days. A couple of weeks ago, pt was sick and her daughter says that the sickness is resolving but she has not been back to her old self.   Past Medical History  Diagnosis Date  . COPD (chronic obstructive pulmonary disease)     PFTs were done in 2008 at [facility]  . Diabetes mellitus type II, non insulin dependent   . Arthritis   . Essential hypertension   . Nonocclusive coronary atherosclerosis of native coronary artery 2006 through 2012    multi caths 2012, 2006, [id number] (4854 complicated by catheter-induced dissection of small nondominant RCA, patent in 2009 with no residual abnormality); Myoview August 2013: LOW RISK, normal EF. ; Echocardiogram [name] - January 2014) moderate LVH, EF 55-65%. No significant valvular disease.  Marland Kitchen Hyperlipidemia   . OSA (obstructive sleep apnea) 9/25/ 2012    tested 2009; tetested sleep study 07/2011--titration 09/20/2011 now use Bi-PAP  . Anxiety disorder     With apparent panic attacks  . Hiatal hernia    Past Surgical History  Procedure Laterality Date  . Abdominal hysterectomy    . Lipoma excision       rt anterior abdomen  . Nm myoview ltd  07/2012    (Most recent of many) LOW RISK ; normal EF and normal perfusion , low risk  . Cardiac catheterization  2006,20072009    Non-occlusice CAD - only 80% ostial SP1;  NON DOMINANNT  RCA (catheter insuced dissection with MI in 2007 --> resolved by 2009 cath)  . Doppler echocardiography  01/09/2013    at [facility]---showed moderate LVH, 55% to 65%  with no significant valve disease  . Pulmonary function test  11/19/2007    at [facility],   No family history on file. Social History  Substance Use Topics  . Smoking status: Former Smoker    Quit date: 04/18/1977  . Smokeless tobacco: Never Used  . Alcohol Use: No   OB History    No data available     Review of Systems  Unable to perform ROS: Acuity of condition      Allergies  Bactrim; Levaquin; Mucinex; Sudafed; Adhesive; Crestor; Lipitor; Norvasc; Penicillins; and Vytorin  Home Medications   Prior to Admission medications   Medication Sig Start Date End Date Taking? Authorizing Provider  albuterol (PROVENTIL) (2.5 MG/3ML) 0.083% nebulizer solution Take 2.5 mg by nebulization as needed for wheezing or shortness of breath.   Yes Historical Provider, MD  ALPRAZolam Duanne Moron) 0.5 MG tablet Take 0.25 mg by mouth every morning.   Yes Historical Provider, MD  busPIRone (  BUSPAR) 7.5 MG tablet Take 7.5 mg by mouth 2 (two) times daily.   Yes Historical Provider, MD  Calcium Carbonate-Vitamin D (CALCIUM + D PO) Take 1 tablet by mouth 2 (two) times daily.    Yes Historical Provider, MD  Coenzyme Q10 (COQ10) 200 MG CAPS Take 100 mg by mouth every morning.    Yes Historical Provider, MD  CRANBERRY PO Take 1 capsule by mouth 2 (two) times daily. 3600mg    Yes Historical Provider, MD  enalapril (VASOTEC) 2.5 MG tablet Take 2.5 mg by mouth daily.   Yes Historical Provider, MD  escitalopram (LEXAPRO) 20 MG tablet Take 20 mg by mouth every morning.    Yes Historical Provider, MD  isosorbide  mononitrate (IMDUR) 30 MG 24 hr tablet TAKE ONE TABLET BY MOUTH DAILY. 05/13/15  Yes Leonie Man, MD  meloxicam (MOBIC) 7.5 MG tablet Take 7.5 mg by mouth 2 (two) times daily.   Yes Historical Provider, MD  mirtazapine (REMERON) 15 MG tablet Take 15 mg by mouth at bedtime.   Yes Historical Provider, MD  Multiple Vitamin (MULTIVITAMIN) tablet Take 1 tablet by mouth daily.   Yes Historical Provider, MD  nitroGLYCERIN (NITROSTAT) 0.4 MG SL tablet Place 1 tablet (0.4 mg total) under the tongue every 5 (five) minutes as needed for chest pain. Max 3 doses. 06/16/15  Yes Leonie Man, MD  Omega-3 Fatty Acids (FISH OIL) 1200 MG CAPS Take 1 capsule by mouth at bedtime.   Yes Historical Provider, MD  OVER THE COUNTER MEDICATION Hair skin and nail vitamins dily   Yes Historical Provider, MD  pantoprazole (PROTONIX) 40 MG tablet TAKE ONE TABLET BY MOUTH ONCE DAILY. 03/30/15  Yes Lorretta Harp, MD  pravastatin (PRAVACHOL) 40 MG tablet Take 1 tablet (40 mg total) by mouth every evening. 06/16/15  Yes Leonie Man, MD  tiotropium (SPIRIVA) 18 MCG inhalation capsule Place 18 mcg into inhaler and inhale daily.   Yes Historical Provider, MD   BP 157/78 mmHg  Pulse 62  Temp(Src) 98.6 F (37 C) (Oral)  Resp 11  Ht 5\' 1"  (1.549 m)  Wt 172 lb (78.019 kg)  BMI 32.52 kg/m2  SpO2 97% Physical Exam  Constitutional: She is oriented to person, place, and time. She appears well-developed and well-nourished. No distress.  Face symmetric and she's awake, seeming sleepy  HENT:  Head: Normocephalic and atraumatic.  Eyes: Conjunctivae are normal. Right eye exhibits no nystagmus. Left eye exhibits no nystagmus.  Eye movement intact  Neck: Neck supple. No tracheal deviation present.  Cardiovascular: Normal rate, regular rhythm and normal heart sounds.   Pulmonary/Chest: Effort normal and breath sounds normal. No respiratory distress.  Abdominal: There is no tenderness.  Musculoskeletal: Normal range of motion.   Decent grip strength bilaterally.   Neurological: She is alert and oriented to person, place, and time.  Skin: Skin is warm and dry.  Psychiatric: She has a normal mood and affect. Her behavior is normal.  Nursing note and vitals reviewed.   ED Course  Procedures (including critical care time) DIAGNOSTIC STUDIES: Oxygen Saturation is 95% on RA, adequate by my interpretation.    COORDINATION OF CARE: 11:35 AM- Pt advised of plan for treatment and pt agrees.    Labs Review Labs Reviewed  COMPREHENSIVE METABOLIC PANEL - Abnormal; Notable for the following:    Glucose, Bld 105 (*)    All other components within normal limits  TROPONIN I  CBC WITH DIFFERENTIAL/PLATELET  URINALYSIS, ROUTINE W REFLEX  MICROSCOPIC (NOT AT [facility])    Imaging Review Dg Chest 2 View  08/20/2015   CLINICAL DATA:  69 year old female with headache and dizziness today. History of COPD and asthma.  EXAM: CHEST  2 VIEW  COMPARISON:  10/11/2014 and prior radiographs.  FINDINGS: Cardiomegaly again noted.  There is no evidence of focal airspace disease, pulmonary edema, suspicious pulmonary nodule/mass, pleural effusion, or pneumothorax. No acute bony abnormalities are identified.  IMPRESSION: Cardiomegaly without evidence of acute cardiopulmonary disease.   Electronically Signed   By: Margarette Canada M.D.   On: 08/20/2015 12:52   Ct Head Wo Contrast  08/20/2015   CLINICAL DATA:  Headache and generalized weakness since last night.  EXAM: CT HEAD WITHOUT CONTRAST  TECHNIQUE: Contiguous axial images were obtained from the base of the skull through the vertex without intravenous contrast.  COMPARISON:  05/23/2008  FINDINGS: No mass effect or midline shift. No evidence of acute intracranial hemorrhage, or infarction. No abnormal extra-axial fluid collections. Gray-white matter differentiation is normal. Basal cisterns are preserved.  No depressed skull fractures. Visualized paranasal sinuses and mastoid air cells are not opacified.   IMPRESSION: No acute intracranial abnormality.   Electronically Signed   By: Fidela Salisbury M.D.   On: 08/20/2015 13:06   I have personally reviewed and evaluated these images and lab results as part of my medical decision-making.   EKG Interpretation   Date/Time:  Thursday August 20 2015 11:13:59 EDT Ventricular Rate:  86 PR Interval:  183 QRS Duration: 86 QT Interval:  324 QTC Calculation: 387 R Axis:   32 Text Interpretation:  Sinus rhythm Borderline repolarization abnormality  Confirmed by Alvino Chapel  MD, Salimata Christenson [id number]) on 08/20/2015 11:22:00 AM      MDM   Final diagnoses:  Chest pain, unspecified chest pain type  Numbness  Essential hypertension    Patient presented with chest pain and numbness. Has had previous heart caths no obstruction disease but has had coronary dissection from the cath. EKG reassuring and enzymes negative. Did have face numbness and hypertension. Will admit for possible stroke rule out and chest pain.  I personally performed the services described in this documentation, which was scribed in my presence. The recorded information has been reviewed and is accurate.     Davonna Belling, MD 08/20/15 [phone]

## 2015-08-20 NOTE — ED Notes (Addendum)
Pt transferred via stretcher on monitor to rm 303 by nurse tech. Report has been given to Vista Deck, RN on 3rd floor.

## 2015-08-20 NOTE — H&P (Signed)
[facility]          History and Physical    PCP:   Purvis Kilts, MD   EDP: Davonna Belling, M.D.  Chief Complaint:  "I don't know, I just felt strange"  HPI: Patient is a 69 year old woman with history significant for depression, anxiety, history of MI and cardiac arrest following a catheter-induced RCA dissection during a cardiac catheterization in 2007. She has multiple social stressors including the recent passing of her mother, division of her children to to the point where some of her children do not even visit her anymore. She is known to have severe depression and anxiety, is the caregiver for her husband, and after her mother's passing also became the caregiver for her brother. She states that yesterday she started feeling "strange" she cannot quantify this any further, states that she didn't feel real chest pain, maybe some chest tightness, no shortness of breath, no palpitations. She took her blood pressure was 150/90 she took a nitroglycerin. Over the course of the next 5 hours she took a total of 4 nitroglycerin and when she checked her blood pressure the last time it was 200/100. She did not seek medical attention at that time, instead went to bed. She woke up this morning and still felt "strange", "numb all over" decided to come to the hospital. Workup in the emergency department is essentially negative including blood work, troponins, CT scan of the head, chest x-ray, urinalysis and EKG. We have been asked to admit her for further evaluation and management.  Allergies:   Allergies  Allergen Reactions  . Bactrim [Sulfamethoxazole-Trimethoprim] Shortness Of Breath and Rash    REACTION: Choking, inability to swallow, redness  . Levaquin [Levofloxacin] Shortness Of Breath, Rash and Other (See Comments)    Reaction:Choking Brand name Levaquin ok per pt  . Mucinex [Guaifenesin Er] Shortness Of Breath and Rash  . Sudafed [Pseudoephedrine Hcl]  Shortness Of Breath, Rash and Other (See Comments)    REACTION: Choking, redness, inability to swallow  . Adhesive [Tape] Other (See Comments)    REACTION: redness/irritation at application site. **Certain bandages/adhesives cause this reaction**  . Crestor [Rosuvastatin] Other (See Comments)    LEG ACHING  . Lipitor [Atorvastatin]   . Norvasc [Amlodipine]   . Penicillins Other (See Comments)    REACTION: Faint ("passes out")  . Vytorin [Ezetimibe-Simvastatin]       Past Medical History  Diagnosis Date  . COPD (chronic obstructive pulmonary disease)     PFTs were done in 2008 at [facility]  . Diabetes mellitus type II, non insulin dependent   . Arthritis   . Essential hypertension   . Nonocclusive coronary atherosclerosis of native coronary artery 2006 through 2012    multi caths 2012, 2006, [id number] (6213 complicated by catheter-induced dissection of small nondominant RCA, patent in 2009 with no residual abnormality); Myoview August 2013: LOW RISK, normal EF. ; Echocardiogram [name] - January 2014) moderate LVH, EF 55-65%. No significant valvular disease.  Marland Kitchen Hyperlipidemia   . OSA (obstructive sleep apnea) 9/25/ 2012    tested 2009; tetested sleep study 07/2011--titration 09/20/2011 now use Bi-PAP  . Anxiety disorder     With apparent panic attacks  . Hiatal hernia     Past Surgical History  Procedure Laterality Date  . Abdominal hysterectomy    . Lipoma excision      rt anterior abdomen  . Nm  myoview ltd  07/2012    (Most recent of many) LOW RISK ; normal EF and normal perfusion , low risk  . Cardiac catheterization  2006,20072009    Non-occlusice CAD - only 80% ostial SP1;  NON DOMINANNT  RCA (catheter insuced dissection with MI in 2007 --> resolved by 2009 cath)  . Doppler echocardiography  01/09/2013    at [facility]---showed moderate LVH, 55% to 65%  with no significant valve disease  . Pulmonary function test  11/19/2007    at [facility],    Prior to Admission  medications   Medication Sig Start Date End Date Taking? Authorizing Provider  albuterol (PROVENTIL) (2.5 MG/3ML) 0.083% nebulizer solution Take 2.5 mg by nebulization as needed for wheezing or shortness of breath.   Yes Historical Provider, MD  ALPRAZolam Duanne Moron) 0.5 MG tablet Take 0.25 mg by mouth every morning.   Yes Historical Provider, MD  busPIRone (BUSPAR) 7.5 MG tablet Take 7.5 mg by mouth 2 (two) times daily.   Yes Historical Provider, MD  Calcium Carbonate-Vitamin D (CALCIUM + D PO) Take 1 tablet by mouth 2 (two) times daily.    Yes Historical Provider, MD  Coenzyme Q10 (COQ10) 200 MG CAPS Take 100 mg by mouth every morning.    Yes Historical Provider, MD  CRANBERRY PO Take 1 capsule by mouth 2 (two) times daily. 3678m   Yes Historical Provider, MD  enalapril (VASOTEC) 2.5 MG tablet Take 2.5 mg by mouth daily.   Yes Historical Provider, MD  escitalopram (LEXAPRO) 20 MG tablet Take 20 mg by mouth every morning.    Yes Historical Provider, MD  isosorbide mononitrate (IMDUR) 30 MG 24 hr tablet TAKE ONE TABLET BY MOUTH DAILY. 05/13/15  Yes DLeonie Man MD  meloxicam (MOBIC) 7.5 MG tablet Take 7.5 mg by mouth 2 (two) times daily.   Yes Historical Provider, MD  mirtazapine (REMERON) 15 MG tablet Take 15 mg by mouth at bedtime.   Yes Historical Provider, MD  Multiple Vitamin (MULTIVITAMIN) tablet Take 1 tablet by mouth daily.   Yes Historical Provider, MD  nitroGLYCERIN (NITROSTAT) 0.4 MG SL tablet Place 1 tablet (0.4 mg total) under the tongue every 5 (five) minutes as needed for chest pain. Max 3 doses. 06/16/15  Yes DLeonie Man MD  Omega-3 Fatty Acids (FISH OIL) 1200 MG CAPS Take 1 capsule by mouth at bedtime.   Yes Historical Provider, MD  OVER THE COUNTER MEDICATION Hair skin and nail vitamins dily   Yes Historical Provider, MD  pantoprazole (PROTONIX) 40 MG tablet TAKE ONE TABLET BY MOUTH ONCE DAILY. 03/30/15  Yes JLorretta Harp MD  pravastatin (PRAVACHOL) 40 MG tablet Take 1  tablet (40 mg total) by mouth every evening. 06/16/15  Yes DLeonie Man MD  tiotropium (SPIRIVA) 18 MCG inhalation capsule Place 18 mcg into inhaler and inhale daily.   Yes Historical Provider, MD    Social History:  reports that she quit smoking about 38 years ago. She has never used smokeless tobacco. She reports that she does not drink alcohol or use illicit drugs.  Family history: Father deceased with massive MI, multiple family members with hypertension and coronary artery disease  Review of Systems:  Constitutional: Denies fever, chills, diaphoresis, appetite change and fatigue.  HEENT: Denies photophobia, eye pain, redness, hearing loss, ear pain, congestion, sore throat, rhinorrhea, sneezing, mouth sores, trouble swallowing, neck pain, neck stiffness and tinnitus.   Respiratory: Denies SOB, DOE, cough, chest tightness,  and wheezing.  Cardiovascular: Denies chest pain, palpitations and leg swelling.  Gastrointestinal: Denies nausea, vomiting, abdominal pain, diarrhea, constipation, blood in stool and abdominal distention.  Genitourinary: Denies dysuria, urgency, frequency, hematuria, flank pain and difficulty urinating.  Endocrine: Denies: hot or cold intolerance, sweats, changes in hair or nails, polyuria, polydipsia. Musculoskeletal: Denies myalgias, back pain, joint swelling, arthralgias and gait problem.  Skin: Denies pallor, rash and wound.  Neurological: Denies dizziness, seizures, syncope, weakness, light-headedness, numbness and headaches.  Hematological: Denies adenopathy. Easy bruising, personal or family bleeding history  Psychiatric/Behavioral: Denies suicidal ideation, mood changes, confusion, nervousness, sleep disturbance and agitation   Physical Exam: Blood pressure 157/78, pulse 62, temperature 98.6 F (37 C), temperature source Oral, resp. rate 11, height _0  (1.549 m), weight 78.019 kg (172 lb), SpO2 97 %. General: Alert, awake, oriented 3. HEENT:  Normocephalic, atraumatic, pupils equal and reactive to light, extraocular movements intact, moist mucous membranes, no pharyngeal erythema or exudates. Neck: Supple, no JVD, no lymphadenopathy, no bruits, no goiter. Next an cardiovascular: Regular rate and rhythm, no murmurs, rubs or gallops. Lungs: Clear to auscultation bilaterally. Abdomen: Soft, nontender, nondistended, positive bowel sounds, no masses or organomegaly noted. Extremities: No clubbing, cyanosis or edema, positive pulses. 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 Neurologic: Grossly intact and nonfocal  Labs on Admission:  Results for orders placed or performed during the hospital encounter of 08/20/15 (from the past 48 hour(s))  Comprehensive metabolic panel     Status: Abnormal   Collection Time: 08/20/15 11:59 AM  Result Value Ref Range   Sodium 139 135 - 145 mmol/L   Potassium 4.2 3.5 - 5.1 mmol/L   Chloride 104 101 - 111 mmol/L   CO2 28 22 - 32 mmol/L   Glucose, Bld 105 (H) 65 - 99 mg/dL   BUN 15 6 - 20 mg/dL   Creatinine, Ser 0.78 0.44 - 1.00 mg/dL   Calcium 9.8 8.9 - 10.3 mg/dL   Total Protein 6.7 6.5 - 8.1 g/dL   Albumin 4.1 3.5 - 5.0 g/dL   AST 19 15 - 41 U/L   ALT 23 14 - 54 U/L   Alkaline Phosphatase 54 38 - 126 U/L   Total Bilirubin 0.7 0.3 - 1.2 mg/dL   GFR calc non Af Amer >60 >60 mL/min   GFR calc Af Amer >60 >60 mL/min    Comment: (NOTE) The eGFR has been calculated using the CKD EPI equation. This calculation has not been validated in all clinical situations. eGFR's persistently <60 mL/min signify possible Chronic Kidney Disease.    Anion gap 7 5 - 15  Troponin I     Status: None   Collection Time: 08/20/15 11:59 AM  Result Value Ref Range   Troponin I <0.03 <0.031 ng/mL    Comment:        NO INDICATION OF MYOCARDIAL INJURY.   CBC with Differential     Status: None   Collection Time: 08/20/15 11:59 AM  Result Value Ref Range   WBC 7.7 4.0 - 10.5 K/uL   RBC 4.96 3.87  - 5.11 MIL/uL   Hemoglobin 15.0 12.0 - 15.0 g/dL   HCT 44.1 36.0 - 46.0 %   MCV 88.9 78.0 - 100.0 fL   MCH 30.2 26.0 - 34.0 pg   MCHC 34.0 30.0 - 36.0 g/dL   RDW 12.5 11.5 - 15.5 %   Platelets 183 150 - 400 K/uL   Neutrophils Relative % 58 43 - 77 %  Neutro Abs 4.5 1.7 - 7.7 K/uL   Lymphocytes Relative 31 12 - 46 %   Lymphs Abs 2.4 0.7 - 4.0 K/uL   Monocytes Relative 9 3 - 12 %   Monocytes Absolute 0.7 0.1 - 1.0 K/uL   Eosinophils Relative 2 0 - 5 %   Eosinophils Absolute 0.1 0.0 - 0.7 K/uL   Basophils Relative 0 0 - 1 %   Basophils Absolute 0.0 0.0 - 0.1 K/uL  Urinalysis, Routine w reflex microscopic (not at [facility])     Status: None   Collection Time: 08/20/15  1:35 PM  Result Value Ref Range   Color, Urine YELLOW YELLOW   APPearance CLEAR CLEAR   Specific Gravity, Urine 1.010 1.005 - 1.030   pH 6.5 5.0 - 8.0   Glucose, UA NEGATIVE NEGATIVE mg/dL   Hgb urine dipstick NEGATIVE NEGATIVE   Bilirubin Urine NEGATIVE NEGATIVE   Ketones, ur NEGATIVE NEGATIVE mg/dL   Protein, ur NEGATIVE NEGATIVE mg/dL   Urobilinogen, UA 0.2 0.0 - 1.0 mg/dL   Nitrite NEGATIVE NEGATIVE   Leukocytes, UA NEGATIVE NEGATIVE    Comment: MICROSCOPIC NOT DONE ON URINES WITH NEGATIVE PROTEIN, BLOOD, LEUKOCYTES, NITRITE, OR GLUCOSE <1000 mg/dL.    Radiological Exams on Admission: Dg Chest 2 View  08/20/2015   CLINICAL DATA:  69 year old female with headache and dizziness today. History of COPD and asthma.  EXAM: CHEST  2 VIEW  COMPARISON:  10/11/2014 and prior radiographs.  FINDINGS: Cardiomegaly again noted.  There is no evidence of focal airspace disease, pulmonary edema, suspicious pulmonary nodule/mass, pleural effusion, or pneumothorax. No acute bony abnormalities are identified.  IMPRESSION: Cardiomegaly without evidence of acute cardiopulmonary disease.   Electronically Signed   By: Margarette Canada M.D.   On: 08/20/2015 12:52   Ct Head Wo Contrast  08/20/2015   CLINICAL DATA:  Headache and generalized  weakness since last night.  EXAM: CT HEAD WITHOUT CONTRAST  TECHNIQUE: Contiguous axial images were obtained from the base of the skull through the vertex without intravenous contrast.  COMPARISON:  05/23/2008  FINDINGS: No mass effect or midline shift. No evidence of acute intracranial hemorrhage, or infarction. No abnormal extra-axial fluid collections. Gray-white matter differentiation is normal. Basal cisterns are preserved.  No depressed skull fractures. Visualized paranasal sinuses and mastoid air cells are not opacified.  IMPRESSION: No acute intracranial abnormality.   Electronically Signed   By: Fidela Salisbury M.D.   On: 08/20/2015 13:06    Assessment/Plan Principal Problem:   Chest pain Active Problems:   Hypothyroidism   ANXIETY DEPRESSION   Nonocclusive coronary atherosclerosis of native coronary artery    Chest pain -Will admit as observation, repeat EKG in the morning, cycle troponins. -Suspect she has not had an acute coronary syndrome and this is mainly anxiety and stress induced.  Elevated blood pressure without diagnosis of hypertension -Again suspect related to anxiety and social stressors. -Has spent most of our consultation counseling her. -I am not inclined to start any blood pressure medications while in the hospital. Will need close follow-up with her outpatient provider for this issue.  Anxiety/depression -Continue BuSpar and when necessary Xanax.  Hypothyroidism -Check TSH, continue Synthroid.  History of coronary artery disease -Last saw her cardiologist in June 2016. Appears stable. See above.  DVT prophylaxis -Lovenox  CODE STATUS  -Full code as discussed with patient and daughter at bedside.   Time Spent on Admission: 115 minutes  [facility] Pager: [phone] 08/20/2015, 4:07 PM

## 2015-08-20 NOTE — ED Notes (Signed)
Pt c/o mid chest pain 8/10 that she describes as "tightness". Pt reports her face feels funny and is tingling. Pt is very weak. Pt reports dizziness, SOB, clammy feeling. Pt denies nausea.

## 2015-08-20 NOTE — ED Notes (Signed)
Hospitalist at bedside 

## 2015-08-20 NOTE — ED Notes (Signed)
MD at bedside. 

## 2015-08-20 NOTE — Progress Notes (Signed)
Pt and her family has requested to be transferred to [facility]. E-Paged Dr. Jerilee Hoh. No further instructions at this time.

## 2015-08-20 NOTE — ED Notes (Signed)
EMS called out due to possible stroke at [facility]. Pt c/o headache, dizziness, SOB. EMS reported pt was having substernal chest pain that started yesterday at  1700. EKG showed NSR. CBG 111. 4L O2 via . EMS gave 4 baby ASA, 2 Nitro. Pt took Nitro at home overnight with very slight relief. No nausea, diaphoresis present with EMS. BP 220/120 initially with EMS. Pt reported no hx of high blood pressure.

## 2015-08-21 DIAGNOSIS — R079 Chest pain, unspecified: Secondary | ICD-10-CM | POA: Diagnosis not present

## 2015-08-21 LAB — BASIC METABOLIC PANEL
ANION GAP: 7 (ref 5–15)
BUN: 17 mg/dL (ref 6–20)
CHLORIDE: 106 mmol/L (ref 101–111)
CO2: 25 mmol/L (ref 22–32)
CREATININE: 0.71 mg/dL (ref 0.44–1.00)
Calcium: 9.1 mg/dL (ref 8.9–10.3)
GFR calc non Af Amer: >60 mL/min (ref >60)
Glucose, Bld: 89 mg/dL (ref 65–99)
Potassium: 4.6 mmol/L (ref 3.5–5.1)
Sodium: 138 mmol/L (ref 135–145)

## 2015-08-21 LAB — CBC
HEMATOCRIT: 44.9 % (ref 36.0–46.0)
HEMOGLOBIN: 15 g/dL (ref 12.0–15.0)
MCH: 30.3 pg (ref 26.0–34.0)
MCHC: 33.4 g/dL (ref 30.0–36.0)
MCV: 90.7 fL (ref 78.0–100.0)
Platelets: 140 10*3/uL — ABNORMAL LOW (ref 150–400)
RBC: 4.95 MIL/uL (ref 3.87–5.11)
RDW: 12.7 % (ref 11.5–15.5)
WBC: 7.2 10*3/uL (ref 4.0–10.5)

## 2015-08-21 LAB — TROPONIN I: Troponin I: <0.03 ng/mL (ref <0.031)

## 2015-08-21 NOTE — Care Management Note (Signed)
Case Management Note  Patient Details  Name: Vickie [name] MRN: [id number] Date of Birth: 08-28-1946  Expected Discharge Date:                  Expected Discharge Plan:  Home/Self Care  In-House Referral:  NA  Discharge planning Services  CM Consult  Post Acute Care Choice:  NA Choice offered to:  NA  DME Arranged:    DME Agency:     HH Arranged:    [place]:     Status of Service:  Completed, signed off  Medicare Important Message Given:    Date Medicare IM Given:    Medicare IM give by:    Date Additional Medicare IM Given:    Additional Medicare Important Message give by:     If discussed at [place] of Stay Meetings, dates discussed:    Additional Comments: Pt admitted r/o chest pain. Pt is from home with husband and is independent with ADL's. Pt discharging home today with self care. No CM needs.  Sherald Barge, RN 08/21/2015, 11:25 AM

## 2015-08-21 NOTE — Discharge Summary (Signed)
Physician Discharge Summary  Vickie [name] SWF:[id number] DOB: 1946-04-17 DOA: 08/20/2015  PCP: Purvis Kilts, MD  Admit date: 08/20/2015 Discharge date: 08/21/2015  Time spent: 35 minutes  Recommendations for Outpatient Follow-up:   Follow up with PCP in 2 weeks   Discharge Diagnoses:  Principal Problem:   Chest pain Active Problems:   Hypothyroidism   ANXIETY DEPRESSION   Nonocclusive coronary atherosclerosis of native coronary artery   Discharge Condition: Improved   Filed Weights   08/20/15 1121 08/20/15 1631  Weight: 78.019 kg (172 lb) 76.386 kg (168 lb 6.4 oz)    History of present illness:  69 year old woman with history significant for depression, anxiety, history of MI and cardiac arrest following a catheter-induced RCA dissection during a cardiac catheterization in 2007 presented with complaints of elevated blood pressure and chest discomfort. Workup in the emergency department was essentially negative including blood work, troponins, CT scan of the head, chest x-ray, urinalysis and EKG. Was admitted for further evaluation and management.  Hospital Course:   Chest pain -ACS has been ruled out due to negative cardiac markers and EKG without acute ischemic abnormalities.  -Suspect the chest discomfort is anxiety and stress induced. -No further cardiac work up indicated at present. -Follow up with cardiology as scheduled.  Elevated blood pressure without diagnosis of hypertension -Again suspect related to anxiety and social stressors. -I am not inclined to start any blood pressure medications while in the hospital. Will need to follow-up with her outpatient provider for this issue.  Anxiety/depression -Continue BuSpar and when necessary Xanax.  Hypothyroidism -TSH is within normal limits.   History of coronary artery disease -Last saw her cardiologist in June 2016. Appears stable. See above.  Procedures:  None    Consultations:  None  Discharge Instructions     Medication List    ASK your doctor about these medications        albuterol (2.5 MG/3ML) 0.083% nebulizer solution  Commonly known as:  PROVENTIL  Take 2.5 mg by nebulization as needed for wheezing or shortness of breath.     ALPRAZolam 0.5 MG tablet  Commonly known as:  XANAX  Take 0.25 mg by mouth every morning.     busPIRone 7.5 MG tablet  Commonly known as:  BUSPAR  Take 7.5 mg by mouth 2 (two) times daily.     CALCIUM + D PO  Take 1 tablet by mouth 2 (two) times daily.     CoQ10 200 MG Caps  Take 100 mg by mouth every morning.     CRANBERRY PO  Take 1 capsule by mouth 2 (two) times daily. 3600mg      enalapril 2.5 MG tablet  Commonly known as:  VASOTEC  Take 2.5 mg by mouth daily.     escitalopram 20 MG tablet  Commonly known as:  LEXAPRO  Take 20 mg by mouth every morning.     Fish Oil 1200 MG Caps  Take 1 capsule by mouth at bedtime.     isosorbide mononitrate 30 MG 24 hr tablet  Commonly known as:  IMDUR  TAKE ONE TABLET BY MOUTH DAILY.     meloxicam 7.5 MG tablet  Commonly known as:  MOBIC  Take 7.5 mg by mouth 2 (two) times daily.     mirtazapine 15 MG tablet  Commonly known as:  REMERON  Take 15 mg by mouth at bedtime.     multivitamin tablet  Take 1 tablet by mouth daily.  nitroGLYCERIN 0.4 MG SL tablet  Commonly known as:  NITROSTAT  Place 1 tablet (0.4 mg total) under the tongue every 5 (five) minutes as needed for chest pain. Max 3 doses.     OVER THE COUNTER MEDICATION  Hair skin and nail vitamins dily     pantoprazole 40 MG tablet  Commonly known as:  PROTONIX  TAKE ONE TABLET BY MOUTH ONCE DAILY.     pravastatin 40 MG tablet  Commonly known as:  PRAVACHOL  Take 1 tablet (40 mg total) by mouth every evening.     tiotropium 18 MCG inhalation capsule  Commonly known as:  SPIRIVA  Place 18 mcg into inhaler and inhale daily.       Allergies  Allergen Reactions  .  Bactrim [Sulfamethoxazole-Trimethoprim] Shortness Of Breath and Rash    REACTION: Choking, inability to swallow, redness  . Levaquin [Levofloxacin] Shortness Of Breath, Rash and Other (See Comments)    Reaction:Choking Brand name Levaquin ok per pt  . Mucinex [Guaifenesin Er] Shortness Of Breath and Rash  . Sudafed [Pseudoephedrine Hcl] Shortness Of Breath, Rash and Other (See Comments)    REACTION: Choking, redness, inability to swallow  . Adhesive [Tape] Other (See Comments)    REACTION: redness/irritation at application site. **Certain bandages/adhesives cause this reaction**  . Crestor [Rosuvastatin] Other (See Comments)    LEG ACHING  . Lipitor [Atorvastatin]   . Norvasc [Amlodipine]   . Penicillins Other (See Comments)    REACTION: Faint ("passes out")  . Vytorin [Ezetimibe-Simvastatin]       The results of significant diagnostics from this hospitalization (including imaging, microbiology, ancillary and laboratory) are listed below for reference.    Significant Diagnostic Studies: Dg Chest 2 View  08/20/2015   CLINICAL DATA:  69 year old female with headache and dizziness today. History of COPD and asthma.  EXAM: CHEST  2 VIEW  COMPARISON:  10/11/2014 and prior radiographs.  FINDINGS: Cardiomegaly again noted.  There is no evidence of focal airspace disease, pulmonary edema, suspicious pulmonary nodule/mass, pleural effusion, or pneumothorax. No acute bony abnormalities are identified.  IMPRESSION: Cardiomegaly without evidence of acute cardiopulmonary disease.   Electronically Signed   By: Margarette Canada M.D.   On: 08/20/2015 12:52   Ct Head Wo Contrast  08/20/2015   CLINICAL DATA:  Headache and generalized weakness since last night.  EXAM: CT HEAD WITHOUT CONTRAST  TECHNIQUE: Contiguous axial images were obtained from the base of the skull through the vertex without intravenous contrast.  COMPARISON:  05/23/2008  FINDINGS: No mass effect or midline shift. No evidence of acute  intracranial hemorrhage, or infarction. No abnormal extra-axial fluid collections. Gray-white matter differentiation is normal. Basal cisterns are preserved.  No depressed skull fractures. Visualized paranasal sinuses and mastoid air cells are not opacified.  IMPRESSION: No acute intracranial abnormality.   Electronically Signed   By: Fidela Salisbury M.D.   On: 08/20/2015 13:06    Microbiology: No results found for this or any previous visit (from the past 240 hour(s)).   Labs: Basic Metabolic Panel:  Recent Labs Lab 08/20/15 1159 08/21/15 0446  NA 139 138  K 4.2 4.6  CL 104 106  CO2 28 25  GLUCOSE 105* 89  BUN 15 17  CREATININE 0.78 0.71  CALCIUM 9.8 9.1   Liver Function Tests:  Recent Labs Lab 08/20/15 1159  AST 19  ALT 23  ALKPHOS 54  BILITOT 0.7  PROT 6.7  ALBUMIN 4.1  CBC:  Recent Labs Lab 08/20/15 1159  08/21/15 0453  WBC 7.7 7.2  NEUTROABS 4.5  --   HGB 15.0 15.0  HCT 44.1 44.9  MCV 88.9 90.7  PLT 183 140*   Cardiac Enzymes:  Recent Labs Lab 08/20/15 1159 08/20/15 1635 08/20/15 2226 08/21/15 0446  TROPONINI <0.03 <0.03 <0.03 <0.03   CBG:  Recent Labs Lab 08/20/15 1649  GLUCAP 120*       Signed:  Domingo Mend, MD   [facility] Pager: [phone] 08/21/2015, 7:27 AM  I, Laban Emperor. Leonie Green, acting as scribe, recorded this note contemporaneously in the presence of Dr. Domingo Mend, M.D. on 08/21/2015.   I have reviewed the above documentation for accuracy and completeness, and I agree with the above.  Domingo Mend, MD [facility] Pager: [phone]

## 2015-08-21 NOTE — Progress Notes (Signed)
Utilization review completed.  

## 2015-08-28 ENCOUNTER — Telehealth: Payer: Self-pay | Admitting: Cardiology

## 2015-08-28 NOTE — Telephone Encounter (Signed)
Pt stated that she needs to f/u with Dr. Ellyn Hack as soon as possible because last week she experienced a high BP of 200/116 and some facial numbness . Dr. Hilma Favors wanted her to f/u with Dr. Ellyn Hack. Please call back  Thanks

## 2015-08-28 NOTE — Telephone Encounter (Signed)
Returned call to patient no answer.LMTC. 

## 2015-09-03 NOTE — Telephone Encounter (Signed)
Close encounter 

## 2015-09-08 ENCOUNTER — Ambulatory Visit (INDEPENDENT_AMBULATORY_CARE_PROVIDER_SITE_OTHER): Payer: Medicare Other | Admitting: Cardiology

## 2015-09-08 ENCOUNTER — Encounter: Payer: Self-pay | Admitting: Cardiology

## 2015-09-08 VITALS — BP 118/62 | HR 78 | Ht 61.0 in | Wt 170.1 lb

## 2015-09-08 DIAGNOSIS — E669 Obesity, unspecified: Secondary | ICD-10-CM | POA: Diagnosis not present

## 2015-09-08 DIAGNOSIS — R079 Chest pain, unspecified: Secondary | ICD-10-CM | POA: Diagnosis not present

## 2015-09-08 DIAGNOSIS — I2510 Atherosclerotic heart disease of native coronary artery without angina pectoris: Secondary | ICD-10-CM

## 2015-09-08 DIAGNOSIS — E785 Hyperlipidemia, unspecified: Secondary | ICD-10-CM

## 2015-09-08 DIAGNOSIS — I10 Essential (primary) hypertension: Secondary | ICD-10-CM | POA: Diagnosis not present

## 2015-09-08 MED ORDER — LABETALOL HCL 200 MG PO TABS: ORAL_TABLET | ORAL | Status: DC

## 2015-09-08 NOTE — Patient Instructions (Signed)
Your physician wants you to follow-up in: 3 months with Dr Ellyn Hack.  You will receive a reminder letter in the mail two months in advance. If you don't receive a letter, please call our office to schedule the follow-up appointment.   Take 200mg  of Labetalol if your blood pressure goes above 180 on the top number.  Be sure to lay down and relax and maybe take a xanax to aid in your relaxation.   The next day after you take the Labetalol if your blood pressure is greater than 160 on the top number, take two tablets of your enalapril.  Continue to do that if your blood pressure is greater than 160 on the top number.  Please contact our office is your blood pressure continues to be greater than 160.

## 2015-09-08 NOTE — Progress Notes (Addendum)
PATIENT: Vickie [name] MRN: [id number]  DOB: [birth year]   DOV:09/10/2015 PCP: Purvis Kilts, MD  Clinic Note: Chief Complaint  Patient presents with  . Elevated B/P    having chest pain, SOB and leg swelling  . Chest Pain   HPI: Vickie [name] is a 69 y.o.  female former patient of Dr. Terance Ice, whom I initially saw in April 2015 for the first time.   She was a long-standing patient of Dr. Lowella Fairy with complaints of chronic intermittent chest pain. She has had several cardiac catheterizations and stress test. 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 Otherwise no significant coronary disease. Follow-up cath in 2009 showed resolution of the dissection and no further disease. She had a Myoviews in August 2012 & August 2013 for atypical pain that showed normal EF normal perfusion with low risk studies. She has significant anxiety and is troubled by the smallest things that cause her to be extremely upset -- extremely stressed out by her family. 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  Study Reviewed:  Myoview  06/30/2015:  1. The left ventricular ejection fraction is normal (55-65%). 2. Nuclear stress EF: 61%. 3. There was no ST segment deviation noted during stress. 4. This is a low risk study.  Low risk stress nuclear study with a small, mild, fixed apical defect consistent with thinning; no ischemia. EF 61 and normal wall motion.  ER 08/20/2015:   Not feeling well, numb --- BP in ~200s/100s night before.  Took Xanax & NTG. Went to [employer] [facility] in AM -- face numb & felt strange --> to MD office BP 220/116 --> ER @ [facility], BP there was 157/78. This past Sun - HR ~100 & BP 105/74 mmHg.    CT Head: no sign of CVA   Interval History:   Part, she was doing relatively well. She is very relieved  to hear the results of her stress test showing no evidence of ischemia. She has not really had much else in the way of her chest discomfort but had this recent hospital visits where she was profoundly hypertensive and in the emergency room. Thankfully the work up at that time was relatively negative and her symptoms improved. There is no sign of stroke. Since this episode really no further episodes been similar. She has not had any significant lightheadedness or dizziness. She does delay she is tired easily and saw some mild aches and pains here and there. Has not had any episodes where she feels less pass out.  She is very concerned with her recent visit that she may need another heart catheterization. I was quite happy to inform her that I don't think that's necessary in light of her stress test. She saw some palpitations off and on worse with stressful episodes, but her relatively well-controlled.   Besides that one episode really no new syncope or syncope symptoms. No TIA or amaurosis fugax symptoms either. Occasional mild swelling but nothing significant. No PND or orthopnea she wakes up anxious. She sometimes has to take an inhaler in these situations,  but never really has to sit up.   Past Medical History  Diagnosis Date  . COPD (chronic obstructive pulmonary disease)     PFTs were done in 2008 at [facility]  . Diabetes mellitus type II, non insulin dependent   .  Arthritis   . Essential hypertension   . Nonocclusive coronary atherosclerosis of native coronary artery 2006 through 2012    multi caths 2012, 2006, [id number] (8527 complicated by catheter-induced dissection of small nondominant RCA, patent in 2009 with no residual abnormality); Myoview August 2013: LOW RISK, normal EF. ; Echocardiogram [name] - January 2014) moderate LVH, EF 55-65%. No significant valvular disease.  Marland Kitchen Hyperlipidemia   . OSA (obstructive sleep apnea) 9/25/ 2012    tested 2009; tetested sleep study  07/2011--titration 09/20/2011 now use Bi-PAP  . Anxiety disorder     With apparent panic attacks  . Hiatal hernia    Allergies  Allergen Reactions  . Bactrim [Sulfamethoxazole-Trimethoprim] Shortness Of Breath and Rash    REACTION: Choking, inability to swallow, redness  . Levaquin [Levofloxacin] Shortness Of Breath, Rash and Other (See Comments)    Reaction:Choking Brand name Levaquin ok per pt  . Mucinex [Guaifenesin Er] Shortness Of Breath and Rash  . Sudafed [Pseudoephedrine Hcl] Shortness Of Breath, Rash and Other (See Comments)    REACTION: Choking, redness, inability to swallow  . Adhesive [Tape] Other (See Comments)    REACTION: redness/irritation at application site. **Certain bandages/adhesives cause this reaction**  . Crestor [Rosuvastatin] Other (See Comments)    LEG ACHING  . Lipitor [Atorvastatin]   . Norvasc [Amlodipine]   . Penicillins Other (See Comments)    REACTION: Faint ("passes out")  . Vytorin [Ezetimibe-Simvastatin]     Current Outpatient Prescriptions  Medication Sig Dispense Refill  . albuterol (PROAIR HFA) 108 (90 BASE) MCG/ACT inhaler Inhale 1 puff into the lungs every 6 (six) hours as needed for wheezing or shortness of breath.    Marland Kitchen albuterol (PROVENTIL) (2.5 MG/3ML) 0.083% nebulizer solution Take 2.5 mg by nebulization as needed for wheezing or shortness of breath.    . ALPRAZolam (XANAX) 0.5 MG tablet Take 0.25 mg by mouth 2 (two) times daily.     . busPIRone (BUSPAR) 15 MG tablet Take 15 mg by mouth 2 (two) times daily.    . Calcium Carb-Cholecalciferol (CALCIUM 600 + D PO) Take 1 tablet by mouth 2 (two) times daily.    . Coenzyme Q10 (COQ10) 100 MG CAPS Take 100 mg by mouth every morning.    Marland Kitchen CRANBERRY PO Take 1 capsule by mouth 2 (two) times daily. 3600mg     . enalapril (VASOTEC) 2.5 MG tablet Take 2.5 mg by mouth daily.    Marland Kitchen escitalopram (LEXAPRO) 20 MG tablet Take 20 mg by mouth every morning.     . isosorbide mononitrate (IMDUR) 30 MG 24 hr  tablet TAKE ONE TABLET BY MOUTH DAILY. 30 tablet 6  . mirtazapine (REMERON) 15 MG tablet Take 15 mg by mouth at bedtime.    . Multiple Vitamin (MULTIVITAMIN) tablet Take 1 tablet by mouth daily.    . nitroGLYCERIN (NITROSTAT) 0.4 MG SL tablet Place 1 tablet (0.4 mg total) under the tongue every 5 (five) minutes as needed for chest pain. Max 3 doses. 25 tablet 3  . Omega-3 Fatty Acids (FISH OIL) 1200 MG CAPS Take 1 capsule by mouth at bedtime.    . pantoprazole (PROTONIX) 40 MG tablet TAKE ONE TABLET BY MOUTH ONCE DAILY. 30 tablet 8  . pravastatin (PRAVACHOL) 40 MG tablet Take 1 tablet (40 mg total) by mouth every evening. 90 tablet 3  . Tiotropium Bromide Monohydrate (SPIRIVA RESPIMAT) 2.5 MCG/ACT AERS Inhale 2.5 mcg into the lungs 2 (two) times daily.    Marland Kitchen labetalol (NORMODYNE) 200 MG  tablet Take one tablet as needed for a blood pressure >180 on the top number. 15 tablet 2   No current facility-administered medications for this visit.  she was told stop/hold her pravastatin While on Protonix.  Social History   reports that she quit smoking about 38 years ago. She has never used smokeless tobacco. She reports that she does not drink alcohol or use illicit drugs.  -- significant social stressors with lots of trauma at home. Recently lost her mother in February at just under 89 user age. This was after complications of surgery complicated by Septicemia.  This is lead to arguments between her youngest daughter and son.  Family History Family History  Problem Relation Age of Onset  . CAD Mother    no  Premature CAD  ROS: A comprehensive Review of Systems - was performed. Review of Systems  Constitutional: Negative for malaise/fatigue.  HENT: Negative for nosebleeds.   Respiratory: Positive for cough (Intermittent cough) and shortness of breath (She initially with activity or not with activity depending on how anxious she is.). Negative for sputum production and wheezing.   Cardiovascular:  Positive for chest pain (Less frequent spells. Better with Imdur.), palpitations (Intermittent. Not lasting long) and leg swelling (Mild edema of the day -- not enough to bother her.). Negative for claudication (Unclear the symptoms that she described and would like/calf cramping is related to claudication or pseudo--claudication) and PND.  Gastrointestinal: Positive for heartburn (If she misses her PPI) and abdominal pain. Negative for nausea, vomiting, blood in stool and melena.  Genitourinary: Negative for hematuria.  Musculoskeletal: Positive for joint pain (knees and ankles  as well as hands). Negative for myalgias.       Has occasionally had pretty significant lower extremity pain. This past weekend after being up on her feet and walking for longer time she had severe pain along the right lower extremity from the foot up to the knee in the anterior portion. Also the back of the knee. This pain got better with ice and elevation.  Neurological: Positive for dizziness (Positional) and headaches (With her hypertension episode). Negative for sensory change, speech change, focal weakness, seizures and loss of consciousness.  Endo/Heme/Allergies: Does not bruise/bleed easily.  Psychiatric/Behavioral: Positive for depression. Negative for memory loss. The patient is nervous/anxious and has insomnia (due to stress;, has not been using CPAP).        Significant social stress with caring for her 60 year old mother who is trying to "hold on through Christmas". Her husband is also sick and requiring hands-on care.  All other systems reviewed and are negative.   Wt Readings from Last 3 Encounters:  09/08/15 170 lb 1.6 oz (77.157 kg)  08/20/15 168 lb 6.4 oz (76.386 kg)  06/16/15 171 lb 8 oz (77.792 kg)    PHYSICAL EXAM BP 118/62 mmHg  Pulse 78  Ht 5\' 1"  (1.549 m)  Wt 170 lb 1.6 oz (77.157 kg)  BMI 32.16 kg/m2 General appearance: Pleasant, mildly obese woman. She is well-nourished and well-groomed.  Somewhat lessanxious appearing; we aborted the topic of social stresses today.   Neck: no adenopathy, no carotid bruit, no JVD and supple, symmetrical, trachea midline Lungs: clear to auscultation bilaterally, normal percussion bilaterally and Nonlabored, good air movement Heart: RRR, normal S1 and S2 clear very soft 1/6 SEM at RUSB. No R./G. Abdomen: soft, non-tender; bowel sounds normal; no masses,  no organomegaly Extremities: extremities normal, atraumatic, no cyanosis or edema; phocomelia with multiple fingers including both thumbs absent  Pulses: 2+ and symmetric Neurologic: Alert and oriented X 3, normal strength and tone. Normal symmetric reflexes. Normal coordination and gait   Adult ECG Report - not checked   Recent Labs:  Latest lipids were from 10/03/2014 -> checked in April by PCP (not available) Labs obtained from PCP after clinic visit -- will add as addendum once reviewed.   ASSESSMENT / PLAN: Overall stable from a cardiac standpoint. She has intermittent chest discomfort that probably is not cardiac in nature. However she does say that there is improvement with taking Imdur. There may be some coronary spasm component.   Problem List Items Addressed This Visit    Accelerated hypertension    Unusual that she would have such were found episode of hypertension. I think it's possible there was an anxiety attack. It may have been pain or other discomfort. He never had issues like this before but has had some labile blood pressures.  Because of her o positional dizziness, I'm reluctant to put her on a standing medication. She is a low dose of Vasotec only with her Imdur. We talked about monitoring her blood pressures. We will use when necessary labetalol 200 mg for blood pressures greater than 180 mmHg.  I also recommended that she lies down and relaxes and uses Xanax at this time to help relaxing. The following day, after taking labetalol if the blood pressure remains greater than  106 mmHg, I wonder take a total of 5 mg of enalapril and continue that on a daily basis until her blood pressure goes below 160 mmHg.   If it remains higher,she should contact her office for further instructions per       Relevant Medications   labetalol (NORMODYNE) 200 MG tablet   Chest pain with moderate risk for cardiac etiology    Please see the section on nonocclusive coronary disease. Cannot exclude microvascular ischemia or spasm, therefore were using Imdur. Otherwise probably musculoskeletal pain.      Essential hypertension - Primary (Chronic)    Usually controlled on enalapril and Imdur. I think if she does have higher pressures which is monitoring, I will probably increase her standing dose of enalapril to maybe 2.5 mg twice a day.      Relevant Medications   labetalol (NORMODYNE) 200 MG tablet   Hyperlipidemia (Chronic)    Now on pravastatin after being switched over by PCP. She also takes coenzyme Q10. No longer on fenofibrate.      Relevant Medications   labetalol (NORMODYNE) 200 MG tablet   Nonocclusive coronary atherosclerosis of native coronary artery (Chronic)    She actually had invasive evaluations that led to RCA dissection that was found to be back to normal a followup study. She does have a Myoview was negative for ischemia. At this point I think any chest discomfort not likely to be macrovascular. Her symptoms have improved on Imdur though certainly may be some vasospasm component to it.  Continue Imdur 30 mg.      Relevant Medications   labetalol (NORMODYNE) 200 MG tablet   Obesity (BMI 30-39.9) (Chronic)    The patient understands the need to lose weight with diet and exercise. We have discussed specific strategies for this.          No orders of the defined types were placed in this encounter.    PATIENT INSTRUCTIONS: Take 200mg  of Labetalol if your blood pressure goes above 180 on the top number.  Be sure to lay down and relax and maybe  take a  xanax to aid in your relaxation.   The next day after you take the Labetalol if your blood pressure is greater than 160 on the top number, take two tablets of your enalapril.  Continue to do that if your blood pressure is greater than 160 on the top number.  Please contact our office is your blood pressure continues to be greater than 160.    Followup:  3 months  DAVID W. Ellyn Hack, M.D., M.S. Interventional Cardiology [facility]   ADDENDUM: Recent Labs: 04/01/2015  Na+ 141, K+ 4.5, Cl- 102, HCO3- 26 , BUN 17, Cr 1.0, Glu 82, Ca2+ 10.4; AST 18, ALT 15; AlkP 47, Alb 4.3, TP 6.5, T Bili 0.4  TC 215, TG 77, HDL 74, LDL 126 -- not currently a goal for her; would consider increasing Pravachol dose  Hemoglobin A1c 5.1 - well controlled

## 2015-09-10 ENCOUNTER — Encounter: Payer: Self-pay | Admitting: Cardiology

## 2015-09-10 DIAGNOSIS — I10 Essential (primary) hypertension: Secondary | ICD-10-CM | POA: Insufficient documentation

## 2015-09-10 NOTE — Assessment & Plan Note (Signed)
The patient understands the need to lose weight with diet and exercise. We have discussed specific strategies for this.  

## 2015-09-10 NOTE — Assessment & Plan Note (Signed)
Now on pravastatin after being switched over by PCP. She also takes coenzyme Q10. No longer on fenofibrate.

## 2015-09-10 NOTE — Assessment & Plan Note (Signed)
Unusual that she would have such were found episode of hypertension. I think it's possible there was an anxiety attack. It may have been pain or other discomfort. He never had issues like this before but has had some labile blood pressures.  Because of her o positional dizziness, I'm reluctant to put her on a standing medication. She is a low dose of Vasotec only with her Imdur. We talked about monitoring her blood pressures. We will use when necessary labetalol 200 mg for blood pressures greater than 180 mmHg.  I also recommended that she lies down and relaxes and uses Xanax at this time to help relaxing. The following day, after taking labetalol if the blood pressure remains greater than 106 mmHg, I wonder take a total of 5 mg of enalapril and continue that on a daily basis until her blood pressure goes below 160 mmHg.   If it remains higher,she should contact her office for further instructions per

## 2015-09-10 NOTE — Assessment & Plan Note (Signed)
Usually controlled on enalapril and Imdur. I think if she does have higher pressures which is monitoring, I will probably increase her standing dose of enalapril to maybe 2.5 mg twice a day.

## 2015-09-10 NOTE — Assessment & Plan Note (Signed)
She actually had invasive evaluations that led to RCA dissection that was found to be back to normal a followup study. She does have a Myoview was negative for ischemia. At this point I think any chest discomfort not likely to be macrovascular. Her symptoms have improved on Imdur though certainly may be some vasospasm component to it.  Continue Imdur 30 mg.

## 2015-09-10 NOTE — Assessment & Plan Note (Signed)
Please see the section on nonocclusive coronary disease. Cannot exclude microvascular ischemia or spasm, therefore were using Imdur. Otherwise probably musculoskeletal pain.

## 2015-09-16 ENCOUNTER — Encounter: Payer: Self-pay | Admitting: Cardiology

## 2015-10-03 ENCOUNTER — Other Ambulatory Visit: Payer: Self-pay | Admitting: Cardiovascular Disease

## 2015-10-05 NOTE — Telephone Encounter (Signed)
REFILL 

## 2015-10-06 ENCOUNTER — Other Ambulatory Visit: Payer: Self-pay | Admitting: General Surgery

## 2015-10-06 DIAGNOSIS — R079 Chest pain, unspecified: Secondary | ICD-10-CM

## 2015-10-13 ENCOUNTER — Ambulatory Visit
Admission: RE | Admit: 2015-10-13 | Discharge: 2015-10-13 | Disposition: A | Discharge status: Home / Self Care | Payer: Medicare Other | Source: Ambulatory Visit | Attending: General Surgery | Admitting: General Surgery

## 2015-10-13 DIAGNOSIS — R079 Chest pain, unspecified: Secondary | ICD-10-CM

## 2015-10-13 MED ORDER — IOPAMIDOL (ISOVUE-300) INJECTION 61%
75.0000 mL | Freq: Once | INTRAVENOUS | Status: AC | PRN
  Administered 2015-10-13: 75 mL via INTRAVENOUS

## 2015-11-04 ENCOUNTER — Other Ambulatory Visit (HOSPITAL_COMMUNITY): Payer: Self-pay | Admitting: Internal Medicine

## 2015-11-04 ENCOUNTER — Ambulatory Visit (HOSPITAL_COMMUNITY)
Admission: RE | Admit: 2015-11-04 | Discharge: 2015-11-04 | Disposition: A | Discharge status: Home / Self Care | Payer: Medicare Other | Source: Ambulatory Visit | Attending: Internal Medicine | Admitting: Internal Medicine

## 2015-11-04 DIAGNOSIS — R0602 Shortness of breath: Secondary | ICD-10-CM | POA: Diagnosis not present

## 2015-11-04 DIAGNOSIS — I10 Essential (primary) hypertension: Secondary | ICD-10-CM | POA: Diagnosis not present

## 2015-11-04 DIAGNOSIS — R079 Chest pain, unspecified: Secondary | ICD-10-CM

## 2015-12-03 ENCOUNTER — Other Ambulatory Visit: Payer: Self-pay | Admitting: Cardiovascular Disease

## 2015-12-03 NOTE — Telephone Encounter (Signed)
Rx(s) sent to pharmacy electronically.  

## 2015-12-10 ENCOUNTER — Ambulatory Visit: Payer: Medicare Other | Admitting: Cardiology

## 2015-12-16 ENCOUNTER — Other Ambulatory Visit: Payer: Self-pay | Admitting: Cardiology

## 2015-12-17 NOTE — Telephone Encounter (Signed)
Rx(s) sent to pharmacy electronically.  

## 2015-12-27 HISTORY — PX: CATARACT EXTRACTION, BILATERAL: SHX1313

## 2016-01-04 ENCOUNTER — Ambulatory Visit: Payer: Medicare Other | Admitting: Cardiology

## 2016-01-27 ENCOUNTER — Emergency Department (HOSPITAL_COMMUNITY): Payer: Medicare Other

## 2016-01-27 ENCOUNTER — Emergency Department (HOSPITAL_COMMUNITY)
Admission: EM | Admit: 2016-01-27 | Discharge: 2016-01-27 | Disposition: A | Discharge status: Home / Self Care | Payer: Medicare Other | Attending: Emergency Medicine | Admitting: Emergency Medicine

## 2016-01-27 DIAGNOSIS — R112 Nausea with vomiting, unspecified: Secondary | ICD-10-CM | POA: Insufficient documentation

## 2016-01-27 DIAGNOSIS — J441 Chronic obstructive pulmonary disease with (acute) exacerbation: Secondary | ICD-10-CM | POA: Diagnosis not present

## 2016-01-27 DIAGNOSIS — R0902 Hypoxemia: Secondary | ICD-10-CM | POA: Diagnosis not present

## 2016-01-27 DIAGNOSIS — E785 Hyperlipidemia, unspecified: Secondary | ICD-10-CM | POA: Diagnosis not present

## 2016-01-27 DIAGNOSIS — F419 Anxiety disorder, unspecified: Secondary | ICD-10-CM | POA: Diagnosis not present

## 2016-01-27 DIAGNOSIS — R05 Cough: Secondary | ICD-10-CM | POA: Diagnosis not present

## 2016-01-27 DIAGNOSIS — I2510 Atherosclerotic heart disease of native coronary artery without angina pectoris: Secondary | ICD-10-CM | POA: Diagnosis not present

## 2016-01-27 DIAGNOSIS — Z87891 Personal history of nicotine dependence: Secondary | ICD-10-CM | POA: Insufficient documentation

## 2016-01-27 DIAGNOSIS — Z880 Allergy status to penicillin: Secondary | ICD-10-CM | POA: Insufficient documentation

## 2016-01-27 DIAGNOSIS — I10 Essential (primary) hypertension: Secondary | ICD-10-CM | POA: Insufficient documentation

## 2016-01-27 DIAGNOSIS — E119 Type 2 diabetes mellitus without complications: Secondary | ICD-10-CM | POA: Insufficient documentation

## 2016-01-27 DIAGNOSIS — R1111 Vomiting without nausea: Secondary | ICD-10-CM | POA: Diagnosis not present

## 2016-01-27 DIAGNOSIS — R0682 Tachypnea, not elsewhere classified: Secondary | ICD-10-CM | POA: Diagnosis not present

## 2016-01-27 DIAGNOSIS — R062 Wheezing: Secondary | ICD-10-CM

## 2016-01-27 DIAGNOSIS — M1990 Unspecified osteoarthritis, unspecified site: Secondary | ICD-10-CM | POA: Diagnosis not present

## 2016-01-27 DIAGNOSIS — Z9981 Dependence on supplemental oxygen: Secondary | ICD-10-CM | POA: Insufficient documentation

## 2016-01-27 DIAGNOSIS — G4733 Obstructive sleep apnea (adult) (pediatric): Secondary | ICD-10-CM | POA: Diagnosis not present

## 2016-01-27 DIAGNOSIS — R531 Weakness: Secondary | ICD-10-CM | POA: Diagnosis not present

## 2016-01-27 DIAGNOSIS — Z8719 Personal history of other diseases of the digestive system: Secondary | ICD-10-CM | POA: Insufficient documentation

## 2016-01-27 DIAGNOSIS — Z79899 Other long term (current) drug therapy: Secondary | ICD-10-CM | POA: Diagnosis not present

## 2016-01-27 DIAGNOSIS — R404 Transient alteration of awareness: Secondary | ICD-10-CM | POA: Diagnosis not present

## 2016-01-27 LAB — URINALYSIS, ROUTINE W REFLEX MICROSCOPIC
Bilirubin Urine: NEGATIVE
Glucose, UA: NEGATIVE mg/dL
Hgb urine dipstick: NEGATIVE
Ketones, ur: NEGATIVE mg/dL
NITRITE: NEGATIVE
PH: 6 (ref 5.0–8.0)
Protein, ur: NEGATIVE mg/dL
Specific Gravity, Urine: 1.014 (ref 1.005–1.030)

## 2016-01-27 LAB — CBC
HEMATOCRIT: 46.6 % — AB (ref 36.0–46.0)
Hemoglobin: 15.1 g/dL — ABNORMAL HIGH (ref 12.0–15.0)
MCH: 28.9 pg (ref 26.0–34.0)
MCHC: 32.4 g/dL (ref 30.0–36.0)
MCV: 89.3 fL (ref 78.0–100.0)
Platelets: 189 10*3/uL (ref 150–400)
RBC: 5.22 MIL/uL — ABNORMAL HIGH (ref 3.87–5.11)
RDW: 13.3 % (ref 11.5–15.5)
WBC: 8.3 10*3/uL (ref 4.0–10.5)

## 2016-01-27 LAB — COMPREHENSIVE METABOLIC PANEL
ALBUMIN: 4.1 g/dL (ref 3.5–5.0)
ALK PHOS: 69 U/L (ref 38–126)
ALT: 20 U/L (ref 14–54)
AST: 26 U/L (ref 15–41)
Anion gap: 11 (ref 5–15)
BILIRUBIN TOTAL: 0.4 mg/dL (ref 0.3–1.2)
BUN: 17 mg/dL (ref 6–20)
CALCIUM: 9.9 mg/dL (ref 8.9–10.3)
CO2: 24 mmol/L (ref 22–32)
Chloride: 105 mmol/L (ref 101–111)
Creatinine, Ser: 0.83 mg/dL (ref 0.44–1.00)
GFR calc Af Amer: >60 mL/min (ref >60)
GFR calc non Af Amer: >60 mL/min (ref >60)
GLUCOSE: 165 mg/dL — AB (ref 65–99)
POTASSIUM: 4.1 mmol/L (ref 3.5–5.1)
SODIUM: 140 mmol/L (ref 135–145)
TOTAL PROTEIN: 6.7 g/dL (ref 6.5–8.1)

## 2016-01-27 LAB — URINE MICROSCOPIC-ADD ON

## 2016-01-27 LAB — LIPASE, BLOOD: Lipase: 28 U/L (ref 11–51)

## 2016-01-27 MED ORDER — ALBUTEROL SULFATE (2.5 MG/3ML) 0.083% IN NEBU
5.0000 mg | INHALATION_SOLUTION | Freq: Once | RESPIRATORY_TRACT | Status: AC
  Administered 2016-01-27: 5 mg via RESPIRATORY_TRACT
  Filled 2016-01-27: qty 6

## 2016-01-27 MED ORDER — ALBUTEROL SULFATE (2.5 MG/3ML) 0.083% IN NEBU
INHALATION_SOLUTION | RESPIRATORY_TRACT | Status: AC
  Filled 2016-01-27: qty 6

## 2016-01-27 MED ORDER — PREDNISONE 20 MG PO TABS: 60.0000 mg | ORAL_TABLET | Freq: Every day | ORAL | Status: AC

## 2016-01-27 MED ORDER — ACETAMINOPHEN 325 MG PO TABS
650.0000 mg | ORAL_TABLET | Freq: Once | ORAL | Status: AC
  Administered 2016-01-27: 650 mg via ORAL
  Filled 2016-01-27: qty 2

## 2016-01-27 MED ORDER — ALBUTEROL SULFATE (2.5 MG/3ML) 0.083% IN NEBU
5.0000 mg | INHALATION_SOLUTION | Freq: Once | RESPIRATORY_TRACT | Status: AC
  Administered 2016-01-27: 5 mg via RESPIRATORY_TRACT

## 2016-01-27 MED ORDER — DEXAMETHASONE SODIUM PHOSPHATE 10 MG/ML IJ SOLN
10.0000 mg | Freq: Once | INTRAMUSCULAR | Status: AC
  Administered 2016-01-27: 10 mg via INTRAMUSCULAR
  Filled 2016-01-27: qty 1

## 2016-01-27 MED ORDER — ALBUTEROL (5 MG/ML) CONTINUOUS INHALATION SOLN
10.0000 mg/h | INHALATION_SOLUTION | RESPIRATORY_TRACT | Status: AC
  Administered 2016-01-27: 10 mg/h via RESPIRATORY_TRACT
  Filled 2016-01-27: qty 20

## 2016-01-27 NOTE — ED Notes (Signed)
Per [facility], Pt, from home, c/o possible aspiration, emesis, and weakness.  Pain score 6/10.  Pt reports that she took a drink of water, got choked, started coughing, vomited, and now, has a headache.  Pt's daughter reported to EMS at the Pt has been complaining of weakness.

## 2016-01-27 NOTE — ED Provider Notes (Signed)
CSN: [id number]     Arrival date & time 01/27/16  1552 History   First MD Initiated Contact with Patient 01/27/16 1649     Chief Complaint  Patient presents with  . Possible Aspiration   . Emesis    HPI  Patient presents with her daughter who assists with history of present illness. Patient notes that earlier today, she had mild wheezing, but otherwise was in her usual state of health. After attempting to take a Tylenol tablet, patient felt suddenly more dyspneic, nauseous, uncomfortable. She had multiple episodes of vomiting, sustained coughing for some time, with minimal improvement with albuterol. On arrival, the patient's symptoms have improved substantially, though she continues to complain of mild dyspnea, cough, diffuse chest discomfort. No other recent illness, change medication. Patient denies confusion, disorders, syncope.   Past Medical History  Diagnosis Date  . COPD (chronic obstructive pulmonary disease)     PFTs were done in 2008 at [facility]  . Diabetes mellitus type II, non insulin dependent   . Arthritis   . Essential hypertension   . Nonocclusive coronary atherosclerosis of native coronary artery 2006 through 2012    multi caths 2012, 2006, [id number] ([id number] complicated by catheter-induced dissection of small nondominant RCA, patent in 2009 with no residual abnormality); Myoview August 2013: LOW RISK, normal EF. ; Echocardiogram [name] - January 2014) moderate LVH, EF 55-65%. No significant valvular disease.  Marland Kitchen Hyperlipidemia   . OSA (obstructive sleep apnea) 9/25/ 2012    tested 2009; tetested sleep study 07/2011--titration 09/20/2011 now use Bi-PAP  . Anxiety disorder     With apparent panic attacks  . Hiatal hernia    Past Surgical History  Procedure Laterality Date  . Abdominal hysterectomy    . Lipoma excision      rt anterior abdomen  . Nm myoview ltd  07/2012    (Most recent of many) LOW RISK ; normal EF and normal perfusion , low risk  . Cardiac  catheterization  2006,20072009    Non-occlusice CAD - only 80% ostial SP1;  NON DOMINANNT  RCA (catheter insuced dissection with MI in 2007 --> resolved by 2009 cath)  . Doppler echocardiography  01/09/2013    at [facility]---showed moderate LVH, 55% to 65%  with no significant valve disease  . Pulmonary function test  11/19/2007    at [facility],   Family History  Problem Relation Age of Onset  . CAD Mother    Social History  Substance Use Topics  . Smoking status: Former Smoker    Quit date: 04/18/1977  . Smokeless tobacco: Never Used  . Alcohol Use: No   OB History    No data available     Review of Systems  Constitutional:       Per HPI, otherwise negative  HENT:       Per HPI, otherwise negative  Respiratory:       Per HPI, otherwise negative  Cardiovascular:       Per HPI, otherwise negative  Gastrointestinal: Positive for nausea and vomiting.  Endocrine:       Negative aside from HPI  Genitourinary:       Neg aside from HPI   Musculoskeletal:       Per HPI, otherwise negative  Skin: Negative.   Neurological: Negative for syncope.      Allergies  Avelox; Bactrim; Levaquin; Mucinex; Sudafed; Adhesive; Crestor; Lipitor; Norvasc; Penicillins; and Vytorin  Home Medications   Prior to Admission medications  Medication Sig Start Date End Date Taking? Authorizing Provider  albuterol (PROAIR HFA) 108 (90 BASE) MCG/ACT inhaler Inhale 1 puff into the lungs every 6 (six) hours as needed for wheezing or shortness of breath.   Yes Historical Provider, MD  albuterol (PROVENTIL) (2.5 MG/3ML) 0.083% nebulizer solution Take 2.5 mg by nebulization as needed for wheezing or shortness of breath.   Yes Historical Provider, MD  ALPRAZolam Duanne Moron) 0.5 MG tablet Take 0.25 mg by mouth 2 (two) times daily.    Yes Historical Provider, MD  busPIRone (BUSPAR) 15 MG tablet Take 15 mg by mouth 2 (two) times daily.   Yes Historical Provider, MD  Calcium Carb-Cholecalciferol (CALCIUM 600  + D PO) Take 1 tablet by mouth 2 (two) times daily.   Yes Historical Provider, MD  Coenzyme Q10 (COQ10) 100 MG CAPS Take 100 mg by mouth every morning.   Yes Historical Provider, MD  CRANBERRY PO Take 1 capsule by mouth 2 (two) times daily. 3600mg    Yes Historical Provider, MD  enalapril (VASOTEC) 2.5 MG tablet Take 2.5 mg by mouth every evening.    Yes Historical Provider, MD  escitalopram (LEXAPRO) 20 MG tablet Take 20 mg by mouth every morning.    Yes Historical Provider, MD  isosorbide mononitrate (IMDUR) 30 MG 24 hr tablet TAKE ONE TABLET BY MOUTH DAILY. 12/17/15  Yes Leonie Man, MD  labetalol (NORMODYNE) 200 MG tablet Take one tablet as needed for a blood pressure >180 on the top number. Patient taking differently: Take 200 mg by mouth daily as needed. Take one tablet as needed for a blood pressure >180 on the top number. 09/08/15  Yes Leonie Man, MD  mirtazapine (REMERON) 15 MG tablet Take 15 mg by mouth at bedtime.   Yes Historical Provider, MD  Multiple Vitamin (MULTIVITAMIN) tablet Take 1 tablet by mouth daily.   Yes Historical Provider, MD  nitroGLYCERIN (NITROSTAT) 0.4 MG SL tablet Place 1 tablet (0.4 mg total) under the tongue every 5 (five) minutes as needed for chest pain. Max 3 doses. 06/16/15  Yes Leonie Man, MD  Omega-3 Fatty Acids (FISH OIL) 1200 MG CAPS Take 1 capsule by mouth at bedtime.   Yes Historical Provider, MD  pantoprazole (PROTONIX) 40 MG tablet TAKE ONE TABLET BY MOUTH ONCE DAILY. 12/03/15  Yes Leonie Man, MD  pravastatin (PRAVACHOL) 40 MG tablet TAKE ONE TABLET BY MOUTH DAILY. 10/05/15  Yes Lorretta Harp, MD  Tiotropium Bromide Monohydrate (SPIRIVA RESPIMAT) 2.5 MCG/ACT AERS Inhale 2.5 mcg into the lungs 2 (two) times daily.   Yes Historical Provider, MD   BP 153/84 mmHg  Pulse 88  Temp(Src) 98.1 F (36.7 C) (Oral)  Resp 18  SpO2 95% Physical Exam  Constitutional: She is oriented to person, place, and time. She appears well-developed and  well-nourished. No distress.  HENT:  Head: Normocephalic and atraumatic.  Eyes: Conjunctivae and EOM are normal.  Cardiovascular: Normal rate and regular rhythm.   Pulmonary/Chest: Effort normal. No stridor. No respiratory distress. She has decreased breath sounds. She has wheezes.  Abdominal: She exhibits no distension.  Musculoskeletal: She exhibits no edema.  Neurological: She is alert and oriented to person, place, and time. No cranial nerve deficit.  Skin: Skin is warm and dry.  Psychiatric: She has a normal mood and affect.  Nursing note and vitals reviewed.   ED Course  Procedures (including critical care time) Labs Review Labs Reviewed  COMPREHENSIVE METABOLIC PANEL - Abnormal; Notable for the following:  Glucose, Bld 165 (*)    All other components within normal limits  CBC - Abnormal; Notable for the following:    RBC 5.22 (*)    Hemoglobin 15.1 (*)    HCT 46.6 (*)    All other components within normal limits  LIPASE, BLOOD  URINALYSIS, ROUTINE W REFLEX MICROSCOPIC (NOT AT [facility])    Imaging Review Dg Chest 2 View  01/27/2016  CLINICAL DATA:  70 year old female with wheezing and choking EXAM: CHEST  2 VIEW COMPARISON:  Prior chest x-ray 11/04/2015 FINDINGS: Cardiac and mediastinal contours remain unchanged. Atherosclerotic calcifications again noted in the transverse aorta. Bibasilar atelectasis versus scarring appears similar compared to prior. No focal airspace consolidation, pleural effusion or pneumothorax. Stable mild central bronchitic change. No suspicious nodule or mass. No acute osseous abnormality. IMPRESSION: No active cardiopulmonary disease. Electronically Signed   By: Jacqulynn Cadet M.D.   On: 01/27/2016 16:34   I have personally reviewed and evaluated these images and lab results as part of my medical decision-making.   EKG Interpretation None     Afternoon after initial evaluation, with concern for ongoing wheezing, the patient received albuterol  therapy.  Update: After the first albuterol session, patient continues to have wheezing. Second session started.  7:15 PM Continuous neb running.  Patient had minimal improvement following initial two sessions.  9:06 PM Patient substantially better.  We reviewed the need for close monitoring.  Patient has f/u w pulm in the AM>   MDM  Patient presents after an episode of possible aspiration, and subsequent increased respiratory difficulty. Initially the patient has wheezing bilaterally, but after several nebulizer treatment sessions, and initiation of steroids, she improved substantially. Patient has no x-ray evidence for pneumonia. Given the patient's improvement, and previously scheduled follow-up with pulmonology tomorrow, she is discharged stable condition with scheduled albuterol therapy, steroids.  Carmin Muskrat, MD 01/27/16 2108

## 2016-01-27 NOTE — Discharge Instructions (Signed)
Have discussed, with your improved condition, it is important to follow-up with your physician and her morning to ensure that you continue to improve.  Please discuss today's presentation, as well as your prescription for steroids, and scheduled albuterol therapy, every 4 hours for the next 2 days.  Return here for concerning changes in your condition.

## 2016-01-27 NOTE — ED Notes (Signed)
Upon assessment, Pt reports wheezing prior to choking.  Sts she got choked on water and a Tylenol.  Sts she took a puff off her inhaler and it caused her to vomit.

## 2016-01-27 NOTE — ED Notes (Signed)
MD states Saline Lock not needed.

## 2016-01-28 DIAGNOSIS — J449 Chronic obstructive pulmonary disease, unspecified: Secondary | ICD-10-CM | POA: Diagnosis not present

## 2016-01-28 DIAGNOSIS — R911 Solitary pulmonary nodule: Secondary | ICD-10-CM | POA: Diagnosis not present

## 2016-02-02 ENCOUNTER — Other Ambulatory Visit: Payer: Self-pay | Admitting: Cardiovascular Disease

## 2016-02-02 NOTE — Telephone Encounter (Signed)
Rx request sent to pharmacy.  

## 2016-02-15 ENCOUNTER — Ambulatory Visit (HOSPITAL_COMMUNITY)
Admission: RE | Admit: 2016-02-15 | Discharge: 2016-02-15 | Disposition: A | Discharge status: Home / Self Care | Payer: Medicare Other | Source: Ambulatory Visit | Attending: Pulmonary Disease | Admitting: Pulmonary Disease

## 2016-02-15 ENCOUNTER — Other Ambulatory Visit (HOSPITAL_COMMUNITY): Payer: Self-pay | Admitting: Pulmonary Disease

## 2016-02-15 DIAGNOSIS — R0602 Shortness of breath: Secondary | ICD-10-CM

## 2016-02-15 DIAGNOSIS — R05 Cough: Secondary | ICD-10-CM | POA: Diagnosis not present

## 2016-02-15 DIAGNOSIS — J441 Chronic obstructive pulmonary disease with (acute) exacerbation: Secondary | ICD-10-CM | POA: Diagnosis not present

## 2016-02-15 DIAGNOSIS — J9811 Atelectasis: Secondary | ICD-10-CM | POA: Insufficient documentation

## 2016-02-15 DIAGNOSIS — I10 Essential (primary) hypertension: Secondary | ICD-10-CM | POA: Diagnosis not present

## 2016-02-15 DIAGNOSIS — M4184 Other forms of scoliosis, thoracic region: Secondary | ICD-10-CM | POA: Diagnosis not present

## 2016-02-15 DIAGNOSIS — E119 Type 2 diabetes mellitus without complications: Secondary | ICD-10-CM | POA: Diagnosis not present

## 2016-02-15 DIAGNOSIS — M47814 Spondylosis without myelopathy or radiculopathy, thoracic region: Secondary | ICD-10-CM | POA: Insufficient documentation

## 2016-02-15 DIAGNOSIS — J111 Influenza due to unidentified influenza virus with other respiratory manifestations: Secondary | ICD-10-CM | POA: Diagnosis not present

## 2016-02-16 DIAGNOSIS — J111 Influenza due to unidentified influenza virus with other respiratory manifestations: Secondary | ICD-10-CM | POA: Diagnosis not present

## 2016-02-16 DIAGNOSIS — J441 Chronic obstructive pulmonary disease with (acute) exacerbation: Secondary | ICD-10-CM | POA: Diagnosis not present

## 2016-02-18 ENCOUNTER — Ambulatory Visit: Payer: Medicare Other | Admitting: Cardiology

## 2016-02-23 DIAGNOSIS — I10 Essential (primary) hypertension: Secondary | ICD-10-CM | POA: Diagnosis not present

## 2016-02-23 DIAGNOSIS — J441 Chronic obstructive pulmonary disease with (acute) exacerbation: Secondary | ICD-10-CM | POA: Diagnosis not present

## 2016-02-26 ENCOUNTER — Other Ambulatory Visit: Payer: Self-pay

## 2016-02-26 DIAGNOSIS — Z1231 Encounter for screening mammogram for malignant neoplasm of breast: Secondary | ICD-10-CM

## 2016-03-03 ENCOUNTER — Other Ambulatory Visit: Payer: Self-pay

## 2016-03-03 NOTE — Telephone Encounter (Signed)
Hyperlipidemia - Leonie Man, MD at 09/10/2015 10:54 PM     Status: Written Related Problem: Hyperlipidemia   Expand All Collapse All   Now on pravastatin after being switched over by PCP. She also takes coenzyme Q10. No longer on fenofibrate.

## 2016-03-16 DIAGNOSIS — E782 Mixed hyperlipidemia: Secondary | ICD-10-CM | POA: Diagnosis not present

## 2016-03-16 DIAGNOSIS — Z6835 Body mass index (BMI) 35.0-35.9, adult: Secondary | ICD-10-CM | POA: Diagnosis not present

## 2016-03-16 DIAGNOSIS — Z0000 Encounter for general adult medical examination without abnormal findings: Secondary | ICD-10-CM | POA: Diagnosis not present

## 2016-03-16 DIAGNOSIS — Z1389 Encounter for screening for other disorder: Secondary | ICD-10-CM | POA: Diagnosis not present

## 2016-03-16 DIAGNOSIS — J449 Chronic obstructive pulmonary disease, unspecified: Secondary | ICD-10-CM | POA: Diagnosis not present

## 2016-03-17 ENCOUNTER — Ambulatory Visit
Admission: RE | Admit: 2016-03-17 | Discharge: 2016-03-17 | Disposition: A | Discharge status: Home / Self Care | Payer: Medicare Other | Source: Ambulatory Visit

## 2016-03-17 DIAGNOSIS — Z1231 Encounter for screening mammogram for malignant neoplasm of breast: Secondary | ICD-10-CM | POA: Diagnosis not present

## 2016-03-21 DIAGNOSIS — E1140 Type 2 diabetes mellitus with diabetic neuropathy, unspecified: Secondary | ICD-10-CM | POA: Diagnosis not present

## 2016-03-21 DIAGNOSIS — E1151 Type 2 diabetes mellitus with diabetic peripheral angiopathy without gangrene: Secondary | ICD-10-CM | POA: Diagnosis not present

## 2016-03-26 DIAGNOSIS — E119 Type 2 diabetes mellitus without complications: Secondary | ICD-10-CM | POA: Diagnosis not present

## 2016-04-08 ENCOUNTER — Other Ambulatory Visit: Payer: Self-pay | Admitting: Cardiology

## 2016-04-08 ENCOUNTER — Other Ambulatory Visit: Payer: Self-pay | Admitting: *Deleted

## 2016-04-08 MED ORDER — PRAVASTATIN SODIUM 40 MG PO TABS: 40.0000 mg | ORAL_TABLET | Freq: Every day | ORAL | Status: DC

## 2016-04-08 NOTE — Telephone Encounter (Signed)
REFILL 

## 2016-04-14 DIAGNOSIS — M25561 Pain in right knee: Secondary | ICD-10-CM | POA: Diagnosis not present

## 2016-04-14 DIAGNOSIS — Q899 Congenital malformation, unspecified: Secondary | ICD-10-CM | POA: Diagnosis not present

## 2016-04-14 DIAGNOSIS — Z6835 Body mass index (BMI) 35.0-35.9, adult: Secondary | ICD-10-CM | POA: Diagnosis not present

## 2016-04-14 DIAGNOSIS — M1991 Primary osteoarthritis, unspecified site: Secondary | ICD-10-CM | POA: Diagnosis not present

## 2016-04-14 DIAGNOSIS — Z1389 Encounter for screening for other disorder: Secondary | ICD-10-CM | POA: Diagnosis not present

## 2016-04-14 DIAGNOSIS — M959 Acquired deformity of musculoskeletal system, unspecified: Secondary | ICD-10-CM | POA: Diagnosis not present

## 2016-04-25 DIAGNOSIS — J449 Chronic obstructive pulmonary disease, unspecified: Secondary | ICD-10-CM | POA: Diagnosis not present

## 2016-04-25 DIAGNOSIS — J189 Pneumonia, unspecified organism: Secondary | ICD-10-CM | POA: Diagnosis not present

## 2016-04-25 DIAGNOSIS — I10 Essential (primary) hypertension: Secondary | ICD-10-CM | POA: Diagnosis not present

## 2016-04-25 DIAGNOSIS — J309 Allergic rhinitis, unspecified: Secondary | ICD-10-CM | POA: Diagnosis not present

## 2016-05-02 DIAGNOSIS — Z961 Presence of intraocular lens: Secondary | ICD-10-CM | POA: Diagnosis not present

## 2016-05-09 DIAGNOSIS — J209 Acute bronchitis, unspecified: Secondary | ICD-10-CM | POA: Diagnosis not present

## 2016-05-09 DIAGNOSIS — J069 Acute upper respiratory infection, unspecified: Secondary | ICD-10-CM | POA: Diagnosis not present

## 2016-05-09 DIAGNOSIS — Z6835 Body mass index (BMI) 35.0-35.9, adult: Secondary | ICD-10-CM | POA: Diagnosis not present

## 2016-05-09 DIAGNOSIS — E6609 Other obesity due to excess calories: Secondary | ICD-10-CM | POA: Diagnosis not present

## 2016-05-17 DIAGNOSIS — M1711 Unilateral primary osteoarthritis, right knee: Secondary | ICD-10-CM | POA: Diagnosis not present

## 2016-05-18 ENCOUNTER — Other Ambulatory Visit (HOSPITAL_COMMUNITY): Payer: Self-pay | Admitting: Orthopedic Surgery

## 2016-05-18 DIAGNOSIS — M79661 Pain in right lower leg: Secondary | ICD-10-CM

## 2016-05-18 DIAGNOSIS — M7989 Other specified soft tissue disorders: Principal | ICD-10-CM

## 2016-05-19 ENCOUNTER — Ambulatory Visit (HOSPITAL_COMMUNITY)
Admission: RE | Admit: 2016-05-19 | Discharge: 2016-05-19 | Disposition: A | Discharge status: Home / Self Care | Payer: Medicare Other | Source: Ambulatory Visit | Attending: Orthopedic Surgery | Admitting: Orthopedic Surgery

## 2016-05-19 DIAGNOSIS — M79661 Pain in right lower leg: Secondary | ICD-10-CM

## 2016-05-19 DIAGNOSIS — M7989 Other specified soft tissue disorders: Secondary | ICD-10-CM | POA: Insufficient documentation

## 2016-05-30 DIAGNOSIS — E1140 Type 2 diabetes mellitus with diabetic neuropathy, unspecified: Secondary | ICD-10-CM | POA: Diagnosis not present

## 2016-05-30 DIAGNOSIS — E1151 Type 2 diabetes mellitus with diabetic peripheral angiopathy without gangrene: Secondary | ICD-10-CM | POA: Diagnosis not present

## 2016-06-15 ENCOUNTER — Other Ambulatory Visit: Payer: Self-pay | Admitting: Cardiology

## 2016-06-15 ENCOUNTER — Other Ambulatory Visit: Payer: Self-pay | Admitting: *Deleted

## 2016-06-15 MED ORDER — ISOSORBIDE MONONITRATE ER 30 MG PO TB24: 30.0000 mg | ORAL_TABLET | Freq: Every day | ORAL | Status: DC

## 2016-06-29 ENCOUNTER — Other Ambulatory Visit: Payer: Self-pay | Admitting: Cardiology

## 2016-07-12 DIAGNOSIS — M1711 Unilateral primary osteoarthritis, right knee: Secondary | ICD-10-CM | POA: Diagnosis not present

## 2016-07-14 DIAGNOSIS — I10 Essential (primary) hypertension: Secondary | ICD-10-CM | POA: Diagnosis not present

## 2016-07-14 DIAGNOSIS — G4733 Obstructive sleep apnea (adult) (pediatric): Secondary | ICD-10-CM | POA: Diagnosis not present

## 2016-07-14 DIAGNOSIS — J449 Chronic obstructive pulmonary disease, unspecified: Secondary | ICD-10-CM | POA: Diagnosis not present

## 2016-07-26 ENCOUNTER — Other Ambulatory Visit: Payer: Self-pay | Admitting: Cardiology

## 2016-07-26 DIAGNOSIS — J449 Chronic obstructive pulmonary disease, unspecified: Secondary | ICD-10-CM | POA: Diagnosis not present

## 2016-07-27 NOTE — Telephone Encounter (Signed)
Rx request sent to pharmacy.  

## 2016-07-28 ENCOUNTER — Encounter (HOSPITAL_COMMUNITY): Payer: Self-pay | Admitting: Speech Pathology

## 2016-07-28 ENCOUNTER — Ambulatory Visit (HOSPITAL_COMMUNITY): Payer: Medicare Other | Attending: Pulmonary Disease | Admitting: Speech Pathology

## 2016-07-28 DIAGNOSIS — R1312 Dysphagia, oropharyngeal phase: Secondary | ICD-10-CM | POA: Insufficient documentation

## 2016-07-28 NOTE — Therapy (Signed)
[place], [place], [address] Phone: [phone]   Fax:  [phone]  Speech Language Pathology Evaluation  Patient Details  Name: Vickie [name] MRN: [id number] Date of Birth: [birth year] No Data Recorded  Encounter Date: 07/28/2016      End of Session - 07/28/16 1824    Visit Number 1   Number of Visits 1   Authorization Type BCBS Medicare   SLP Start Time 1430   SLP Stop Time  1515   SLP Time Calculation (min) 45 min   Activity Tolerance Patient tolerated treatment well      Past Medical History:  Diagnosis Date  . Anxiety disorder    With apparent panic attacks  . Arthritis   . COPD (chronic obstructive pulmonary disease) ([place])    PFTs were done in 2008 at [facility]  . Diabetes mellitus type II, non insulin dependent ([place])   . Essential hypertension   . Hiatal hernia   . Hyperlipidemia   . Nonocclusive coronary atherosclerosis of native coronary artery 2006 through 2012   multi caths 2012, 2006, [id number] ([id number] complicated by catheter-induced dissection of small nondominant RCA, patent in 2009 with no residual abnormality); Myoview August 2013: LOW RISK, normal EF. ; Echocardiogram Vickie [name] - January 2014) moderate LVH, EF 55-65%. No significant valvular disease.  . OSA (obstructive sleep apnea) 9/25/ 2012   tested 2009; tetested sleep study 07/2011--titration 09/20/2011 now use Bi-PAP    Past Surgical History:  Procedure Laterality Date  . ABDOMINAL HYSTERECTOMY    . CARDIAC CATHETERIZATION  [phone]   Non-occlusice CAD - only 80% ostial SP1;  NON DOMINANNT  RCA (catheter insuced dissection with MI in 2007 --> resolved by 2009 cath)  . DOPPLER ECHOCARDIOGRAPHY  01/09/2013   at [name]---showed moderate LVH, 55% to 65%  with no significant valve disease  . LIPOMA EXCISION     rt anterior abdomen  . NM MYOVIEW LTD  07/2012   (Most recent of many) LOW RISK ; normal EF and normal perfusion  , low risk  . PULMONARY FUNCTION TEST  11/19/2007   at [facility],    There were no vitals filed for this visit.      Subjective Assessment - 07/28/16 1812    Subjective "I get to coughing."   Patient is accompained by: Family member   Currently in Pain? No/denies              Subjective Assessment - 07/28/16 1816      Symptoms/Limitations   Subjective "I get to coughing."   Patient is accompained by: Family member     Pain Assessment   Currently in Pain? No/denies         Prior Functional Status - 07/28/16 1816      Prior Functional Status   Cognitive/Linguistic Baseline Within functional limits    Lives With Spouse         General - 07/28/16 1816      General Information   Date of Onset 07/14/16   HPI [name] is a 70 yo woman who was referred by Dr. Sinda Du for a clinical swallow evaluation due to repeated bouts of pneumonia and coughing. She was seen for MBSS in 2014 and no penetration or aspiration observed. She has a past medical history significant for COPD, GERD, HTN, Type 2 diabetes mellitus, CAD, Chronic kidney disease, and acute myocardial infarction, and asthma. Pt is a former smoker (3  packs a day for 9 years, but quit 35+ years ago).   Type of Study Bedside Swallow Evaluation   Previous Swallow Assessment MBSS 10/2013: Mild pharyngeal phase dysphagia characterized by slight premature spillage of liquids into vallecular and pyriform space. Laryngeal excursion WFL. No penetration or aspiration observed,   Diet Prior to this Study Regular;Thin liquids   Temperature Spikes Noted No   Respiratory Status Room air   History of Recent Intubation No   Behavior/Cognition Alert;Cooperative;Pleasant mood   Oral Cavity Assessment Within Functional Limits   Oral Care Completed by SLP No   Oral Cavity - Dentition Adequate natural dentition;Missing dentition   Vision Functional for self-feeding   Self-Feeding Abilities Able to feed self    Patient Positioning Upright in chair   Baseline Vocal Quality Normal  mildly hoarse/dry   Volitional Cough Strong   Volitional Swallow Able to elicit          Oral Motor/Sensory Function - 07/28/16 1823      Oral Motor/Sensory Function   Overall Oral Motor/Sensory Function Within functional limits         Ice Chips - 07/28/16 1823      Ice Chips   Ice chips Not tested         Thin Liquid - 07/28/16 1823      Thin Liquid   Thin Liquid Within functional limits   Presentation Cup;Self Fed         Nectar thick liquid - 07/28/16 1823      Nectar Thick Liquid   Nectar Thick Liquid Not tested         Honey Thick Liquid - 07/28/16 1823      Honey Thick Liquid   Honey Thick Liquid Not tested         Puree - 07/28/16 1823      Puree   Puree Within functional limits   Presentation Self Fed;Spoon         Solid - 07/28/16 1823      Solid   Solid Within functional limits   Presentation Self Fed             SLP Education - 07/28/16 1815    Education provided Yes   Education Details Recommendations regarding: reflux precautions, aspiration precautions   Person(s) Educated Patient  daughter   Methods Explanation;Handout;Verbal cues   Comprehension Verbalized understanding              Plan - 07/28/16 1825    Clinical Impression Statement Pt accompanied to today's clinical swallow evaluation by her daughter who helped provide background information. As stated above, pt known to this SLP from [facility] in 2014 (essentially normal). Pt's daughter reports that pt "got choked" when taking a pill in February of this year and has had PNA twice since then.   Oral motor examination is unremarkable and pt shows no signs or symptoms of aspiration today during assessment with food and liquid. Pt does endorse, asthma, constipation, COPD, and reflux which may all impact her swallow. She describes a few occurrences of reflux where she was unable to sleep. She  takes Protonix once per day before bedtime. She does not elevate HOB and does not use her   CPAP despite being diagnosed with sleep apnea. Pt tells me that sometimes she feels like she cannot breathe at night and wonders if she can just get oxygen at home through nasal cannula. SLP encouraged pt to either use of CPAP or call the company or  [facility] (she says it has been several years since she obtained it) to see if they can adjust her machine for her as needed. SLP also described the relationship with COPD and aspiration and encouraged pt to always sit upright for po, chew foods thoroughly, consume foods/liquids slowly, and to remain upright after meals. It is unlikely that pt is aspirating foods/liquids on a consistent basis, however it is possible that pt is aspirating relux given the severity of her heartburn symptoms. Strongly recommend elevating HOB and implementing reflux precautions reviewed with pt. Pt could be evaluated by ENT if pharyngoesophageal reflux is suspected. Can complete MBSS if silent aspiration is suspected, however suspect this is unlikely. Pt in agreement with plan of care and plans to call about her CPAP machine.    Consulted and Agree with Plan of Care Patient;Family member/caregiver   Family Member Consulted Daughter      Patient will benefit from skilled therapeutic intervention in order to improve the following deficits and impairments:   Dysphagia, oropharyngeal phase      G-Codes - August 22, 2016 1831    Functional Assessment Tool Used clinical judgment   Functional Limitations Swallowing   Swallow Current Status [id number]) 0 percent impaired, limited or restricted   Swallow Goal Status [id number]) 0 percent impaired, limited or restricted   Swallow Discharge Status [id number]) 0 percent impaired, limited or restricted      Problem List Patient Active Problem List   Diagnosis Date Noted  . Accelerated hypertension 09/10/2015  . Chest pain 08/20/2015  . Chest pain  with moderate risk for cardiac etiology 06/18/2015  . Claudication ([place]) 06/18/2015  . Obesity (BMI 30-39.9) 04/19/2014  . Nonocclusive coronary atherosclerosis of native coronary artery   . Hyperlipidemia   . Dysphagia, unspecified(787.20) 02/17/2014  . Hypothyroidism 04/19/2008  . DIABETES MELLITUS 04/19/2008  . ANXIETY DEPRESSION 04/19/2008  . Essential hypertension 04/19/2008  . HEART ATTACK 04/19/2008  . GASTROESOPHAGEAL REFLUX DISEASE 04/19/2008  . ISCHEMIC COLITIS 04/19/2008  . RECTAL BLEEDING 04/19/2008  . ARTHRITIS 04/19/2008   Thank you,  Genene Churn, [place]  [facility] 22-Aug-2016, 6:32 PM  [place] [address] [place], [place], [address] Phone: [phone]   Fax:  [phone]  Name: Vickie [name] MRN: [id number] Date of Birth: [birth year]

## 2016-08-09 DIAGNOSIS — G4733 Obstructive sleep apnea (adult) (pediatric): Secondary | ICD-10-CM | POA: Diagnosis not present

## 2016-08-15 DIAGNOSIS — E1151 Type 2 diabetes mellitus with diabetic peripheral angiopathy without gangrene: Secondary | ICD-10-CM | POA: Diagnosis not present

## 2016-08-15 DIAGNOSIS — E1140 Type 2 diabetes mellitus with diabetic neuropathy, unspecified: Secondary | ICD-10-CM | POA: Diagnosis not present

## 2016-08-30 DIAGNOSIS — Z23 Encounter for immunization: Secondary | ICD-10-CM | POA: Diagnosis not present

## 2016-09-02 DIAGNOSIS — T8089XS Other complications following infusion, transfusion and therapeutic injection, sequela: Secondary | ICD-10-CM | POA: Diagnosis not present

## 2016-09-09 ENCOUNTER — Encounter: Payer: Self-pay | Admitting: Cardiology

## 2016-09-09 ENCOUNTER — Ambulatory Visit (INDEPENDENT_AMBULATORY_CARE_PROVIDER_SITE_OTHER): Payer: Medicare Other | Admitting: Cardiology

## 2016-09-09 ENCOUNTER — Encounter (INDEPENDENT_AMBULATORY_CARE_PROVIDER_SITE_OTHER): Payer: Self-pay

## 2016-09-09 VITALS — BP 124/76 | HR 68 | Ht 59.0 in | Wt 182.0 lb

## 2016-09-09 DIAGNOSIS — R600 Localized edema: Secondary | ICD-10-CM

## 2016-09-09 DIAGNOSIS — I10 Essential (primary) hypertension: Secondary | ICD-10-CM

## 2016-09-09 DIAGNOSIS — E785 Hyperlipidemia, unspecified: Secondary | ICD-10-CM | POA: Diagnosis not present

## 2016-09-09 DIAGNOSIS — R079 Chest pain, unspecified: Secondary | ICD-10-CM | POA: Diagnosis not present

## 2016-09-09 DIAGNOSIS — I2510 Atherosclerotic heart disease of native coronary artery without angina pectoris: Secondary | ICD-10-CM

## 2016-09-09 LAB — CBC
HCT: 43.3 % (ref 35.0–45.0)
Hemoglobin: 14.4 g/dL (ref 11.7–15.5)
MCH: 29.9 pg (ref 27.0–33.0)
MCHC: 33.3 g/dL (ref 32.0–36.0)
MCV: 89.8 fL (ref 80.0–100.0)
MPV: 9.6 fL (ref 7.5–12.5)
Platelets: 215 10*3/uL (ref 140–400)
RBC: 4.82 MIL/uL (ref 3.80–5.10)
RDW: 13.1 % (ref 11.0–15.0)
WBC: 8.1 10*3/uL (ref 3.8–10.8)

## 2016-09-09 LAB — COMPREHENSIVE METABOLIC PANEL
ALK PHOS: 52 U/L (ref 33–130)
ALT: 16 U/L (ref 6–29)
AST: 18 U/L (ref 10–35)
Albumin: 4.1 g/dL (ref 3.6–5.1)
BILIRUBIN TOTAL: 0.5 mg/dL (ref 0.2–1.2)
BUN: 10 mg/dL (ref 7–25)
CALCIUM: 9.7 mg/dL (ref 8.6–10.4)
CO2: 30 mmol/L (ref 20–31)
Chloride: 105 mmol/L (ref 98–110)
Creat: 0.74 mg/dL (ref 0.50–0.99)
Glucose, Bld: 80 mg/dL (ref 65–99)
POTASSIUM: 4.3 mmol/L (ref 3.5–5.3)
Sodium: 142 mmol/L (ref 135–146)
TOTAL PROTEIN: 6.1 g/dL (ref 6.1–8.1)

## 2016-09-09 LAB — LIPID PANEL
CHOLESTEROL: 203 mg/dL — AB (ref 125–200)
HDL: 57 mg/dL (ref >=46)
LDL Cholesterol: 114 mg/dL (ref <130)
TRIGLYCERIDES: 158 mg/dL — AB (ref <150)
Total CHOL/HDL Ratio: 3.6 Ratio (ref <=5.0)
VLDL: 32 mg/dL — ABNORMAL HIGH (ref <30)

## 2016-09-09 MED ORDER — FUROSEMIDE 20 MG PO TABS: 20.0000 mg | ORAL_TABLET | ORAL | 6 refills | Status: DC | PRN

## 2016-09-09 MED ORDER — ISOSORBIDE MONONITRATE ER 60 MG PO TB24: 60.0000 mg | ORAL_TABLET | Freq: Every day | ORAL | 11 refills | Status: DC

## 2016-09-09 NOTE — Patient Instructions (Signed)
MEDICATION CHANGES--  INCREASE ISOSORBIDE MONO- 60 MG ONE TABLET DAILY  MAY USE FUROSEMIDE 20 MG - ONE TABLET DAILY IF SWELLING OCCURS AS NEEDED.   PLEASE HAVE LABS - CMP ,LIPIDS, CBC - DO NOT DRINK OR EAT THE MORNING OF THE TEST   Your physician wants you to follow-up in: FEB 2018 WITH DR HARDING.You will receive a reminder letter in the mail two months in advance. If you don't receive a letter, please call our office to schedule the follow-up appointment.   If you need a refill on your cardiac medications before your next appointment, please call your pharmacy.

## 2016-09-09 NOTE — Progress Notes (Signed)
PATIENT: Vickie [name] MRN: [id number]  DOB: [birth year]   DOV:09/10/2016 PCP: Vickie Kilts, MD  Clinic Note: Chief Complaint  Patient presents with  . Follow-up    1 yr f/u.   Atypical CP   HPI: Vickie [name] is a 70 y.o.  female former patient of Dr. Terance [name], whom I initially saw in April 2015 for the first time.   She was a long-standing patient of Dr. Lowella [name] with complaints of chronic intermittent chest pain. She has had several cardiac catheterizations and stress test. 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 Otherwise no significant coronary disease. Follow-up cath in 2009 showed resolution of the dissection and no further disease. She had a Myoviews in August 2012 & August 2013 for atypical pain that showed normal EF normal perfusion with low risk studies. She has significant anxiety and is troubled by the smallest things that cause her to be extremely upset -- extremely stressed out by her family. 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  Study Reviewed:  N/a  Last seen 08/2015  Early this year - had Flu & PNA - ~ 2 more bouts of PNA since.   Now has ~hematoma   Interval History:   Vickie [name] is here today really not fully recovered from her prolonged episodes of pneumonia. It seems like the best part of the last 6 months she has been sick from either flu and pneumonia or bronchitis or something else. She still seems quite worn out. She has been having exertional dyspnea with minimal exertion, just very deconditioned. She has had intermittent episodes of chest discomfort which seemed to be mostly resting and nonexertional. She really hasn't been doing enough exertion to to notice anything, but her discomfort is usually in the morning  She doesn't have any PND or orthopnea that sounds  CHF related, she is noting that she has to sleep on pillows because of GERD and drainage.  She's no longer having a lot of coughing or wheezing which isn't a good sign.  She has had some occasional palpitations, but nothing to suggest rapid irregular arrhythmias. She is noticing some mild amount of swelling that goes away with foot elevation. The palpitations to be worse with stress.  No significant syncope or near-syncope symptoms. No TIA or amaurosis fugax symptoms.  Past Medical History:  Diagnosis Date  . Anxiety disorder    With apparent panic attacks  . Arthritis   . COPD (chronic obstructive pulmonary disease) ([place])    PFTs were done in 2008 at [facility]  . Diabetes mellitus type II, non insulin dependent ([place])   . Essential hypertension   . Hiatal hernia   . Hyperlipidemia   . Nonocclusive coronary atherosclerosis of native coronary artery 2006 through 2012   multi caths 2012, 2006, [id number] ([id number] complicated by catheter-induced dissection of small nondominant RCA, patent in 2009 with no residual abnormality); Myoview August 2013: LOW RISK, normal EF. ; Echocardiogram [name] - January 2014) moderate LVH, EF 55-65%. No significant valvular disease.  . OSA (obstructive sleep apnea) 9/25/ 2012   tested 2009; tetested sleep study 07/2011--titration 09/20/2011 now use Bi-PAP   Past Surgical History:  Procedure Laterality Date  . ABDOMINAL HYSTERECTOMY    . CARDIAC CATHETERIZATION  [phone]   Non-occlusice CAD - only 80% ostial SP1;  NON  DOMINANNT  RCA (catheter insuced dissection with MI in 2007 --> resolved by 2009 cath)  . DOPPLER ECHOCARDIOGRAPHY  01/09/2013   at [facility]---showed moderate LVH, 55% to 65%  with no significant valve disease  . LIPOMA EXCISION     rt anterior abdomen  . NM MYOVIEW LTD  07/2012; July 2016   a) (Most recent of many) LOW RISK ; normal EF and normal perfusion , low risk;; b) EF 55-65%, LOW RISK - no ischemia or infarction  . PULMONARY  FUNCTION TEST  11/19/2007   at [facility],    Allergies  Allergen Reactions  . Avelox [Moxifloxacin] Anaphylaxis  . Bactrim [Sulfamethoxazole-Trimethoprim] Shortness Of Breath and Rash    REACTION: Choking, inability to swallow, redness  . Levaquin [Levofloxacin] Shortness Of Breath, Rash and Other (See Comments)    Reaction:Choking Brand name Levaquin ok per pt  . Mucinex [Guaifenesin Er] Shortness Of Breath and Rash  . Sudafed [Pseudoephedrine Hcl] Shortness Of Breath, Rash and Other (See Comments)    REACTION: Choking, redness, inability to swallow  . Adhesive [Tape] Other (See Comments)    REACTION: redness/irritation at application site. **Certain bandages/adhesives cause this reaction**  . Crestor [Rosuvastatin] Other (See Comments)    LEG ACHING  . Lipitor [Atorvastatin]   . Norvasc [Amlodipine]   . Penicillins Other (See Comments)    REACTION: Faint ("passes out")  . Vytorin [Ezetimibe-Simvastatin]     Prior to Admission medications   Medication Sig Start Date Taking? Authorizing Provider  albuterol (PROAIR HFA) 108 (90 BASE) MCG/ACT inhaler Inhale 1 puff into the lungs every 6 (six) hours as needed for wheezing or shortness of breath.  Yes Historical Provider, MD  albuterol (PROVENTIL) (2.5 MG/3ML) 0.083% nebulizer solution Take 2.5 mg by nebulization as needed for wheezing or shortness of breath.  Yes Historical Provider, MD  ALPRAZolam Duanne Moron) 0.5 MG tablet Take 0.25 mg by mouth 2 (two) times daily.   Yes Historical Provider, MD  busPIRone (BUSPAR) 15 MG tablet Take 15 mg by mouth 2 (two) times daily.  Yes Historical Provider, MD  Calcium Carb-Cholecalciferol (CALCIUM 600 + D PO) Take 1 tablet by mouth 2 (two) times daily.  Yes Historical Provider, MD  Coenzyme Q10 (COQ10) 100 MG CAPS Take 100 mg by mouth every morning.  Yes Historical Provider, MD  CRANBERRY PO Take 1 capsule by mouth 2 (two) times daily. 3600mg   Yes Historical Provider, MD  enalapril (VASOTEC) 2.5 MG  tablet Take 2.5 mg by mouth every evening.   Yes Historical Provider, MD  escitalopram (LEXAPRO) 20 MG tablet Take 20 mg by mouth every morning.   Yes Historical Provider, MD  FIBER COMPLETE PO Take 1 tablet by mouth 2 (two) times daily.  Yes Historical Provider, MD  isosorbide mononitrate (IMDUR) 30 MG 24 hr tablet Take 1 tablet (30 mg total) by mouth daily. 06/15/16 Yes Leonie Man, MD  labetalol (NORMODYNE) 200 MG tablet Take one tablet as needed for a blood pressure >180 on the top number. Patient taking differently: Take 200 mg by mouth daily as needed. Take one tablet as needed for a blood pressure >180 on the top number. 09/08/15 Yes Leonie Man, MD  meloxicam (MOBIC) 7.5 MG tablet Take 7.5 mg by mouth 2 (two) times daily.  Yes Historical Provider, MD  mirtazapine (REMERON) 15 MG tablet Take 15 mg by mouth at bedtime.  Yes Historical Provider, MD  Multiple Vitamin (MULTIVITAMIN) tablet Take 1 tablet by mouth daily.  Yes Historical  Provider, MD  nitroGLYCERIN (NITROSTAT) 0.4 MG SL tablet Place 1 tablet (0.4 mg total) under the tongue every 5 (five) minutes as needed for chest pain. Max 3 doses. 06/16/15 Yes Leonie Man, MD  Omega-3 Fatty Acids (FISH OIL) 1200 MG CAPS Take 1 capsule by mouth at bedtime.  Yes Historical Provider, MD  pantoprazole (PROTONIX) 40 MG tablet TAKE ONE TABLET BY MOUTH ONCE DAILY. 12/03/15 Yes Leonie Man, MD  pravastatin (PRAVACHOL) 40 MG tablet TAKE ONE TABLET BY MOUTH DAILY. 07/27/16 Yes Leonie Man, MD  Tiotropium Bromide Monohydrate (SPIRIVA RESPIMAT) 2.5 MCG/ACT AERS Inhale 2.5 mcg into the lungs 2 (two) times daily.  Yes Historical Provider, MD   -she was told stop/hold her pravastatin While on Protonix.  Social History  Social History   Social History  . Marital status: Married    Spouse name: N/A  . Number of children: N/A  . Years of education: N/A   Social History Main Topics  . Smoking status: Former Smoker    Quit date: 04/18/1977    . Smokeless tobacco: Never Used  . Alcohol use No  . Drug use: No  . Sexual activity: Not Asked   Other Topics Concern  . None   Social History Narrative   Married mother of 4, grandmother of 41. Her mother is 78 years old. Quit smoking 34 years ago. Does not drink alcohol.  Retired from [employer] in 2012.   Usually presents with oldest daughter.   Previously worked out at [employer] regularly walking 1/2-1 mile a day, but no longer able to do so because of the [employer] decision to no longer cover [employer].    -- significant social stressors with lots of trauma at home. Recently lost her mother in February at just under 89 user age. This was after complications of surgery complicated by Septicemia.  This is lead to arguments between her youngest daughter and son.  Family History Family History  Problem Relation Age of Onset  . CAD Mother    no  Premature CAD  ROS: A comprehensive Review of Systems - was performed. Review of Systems  Constitutional: Negative for malaise/fatigue.  HENT: Negative for nosebleeds.   Respiratory: Positive for cough (Intermittent cough - improving significantly however.) and shortness of breath (She initially with activity or not with activity depending on how anxious she is.). Negative for sputum production and wheezing.   Cardiovascular: Positive for chest pain (Less frequent spells. Better with Imdur.), palpitations (Intermittent. Not lasting long) and leg swelling (Mild edema of the day -- not enough to bother her.). Negative for claudication (Unclear the symptoms that she described and would like/calf cramping is related to claudication or pseudo--claudication) and PND.  Gastrointestinal: Positive for abdominal pain and heartburn (If she misses her PPI). Negative for blood in stool, melena, nausea and vomiting.  Genitourinary: Negative for hematuria.  Musculoskeletal: Positive for joint pain (knees and ankles  as  well as hands). Negative for myalgias.  Neurological: Positive for dizziness (Positional) and headaches (With her hypertension episode). Negative for sensory change, speech change, focal weakness, seizures and loss of consciousness.  Endo/Heme/Allergies: Does not bruise/bleed easily.  Psychiatric/Behavioral: Positive for depression. Negative for memory loss. The patient is nervous/anxious and has insomnia (due to stress;, has not been using CPAP).        Significant social stress with caring for her 68 year old mother who is trying to "hold on through Christmas". Her husband is also  sick and requiring hands-on care.  All other systems reviewed and are negative.   Wt Readings from Last 3 Encounters:  09/09/16 182 lb (82.6 kg)  09/08/15 170 lb 1.6 oz (77.2 kg)  08/20/15 168 lb 6.4 oz (76.4 kg)    PHYSICAL EXAM BP 124/76   Pulse 68   Ht 4\' 11"  (1.499 m)   Wt 182 lb (82.6 kg)   BMI 36.76 kg/m  General appearance: Pleasant, mildly obese woman. She is well-nourished and well-groomed. Somewhat lessanxious appearing; we aborted the topic of social stresses today.   Neck: no adenopathy, no carotid bruit, no JVD and supple, symmetrical, trachea midline Lungs: clear to auscultation bilaterally, normal percussion bilaterally and Nonlabored, good air movement Heart: RRR, normal S1 and S2 clear very soft 1/6 SEM at RUSB. No R./G. Abdomen: soft, non-tender; bowel sounds normal; no masses,  no organomegaly Extremities: extremities normal, atraumatic, no cyanosis or edema; phocomelia with multiple fingers including both thumbs absent Pulses: 2+ and symmetric Neurologic: Alert and oriented X 3, normal strength and tone. Normal symmetric reflexes. Normal coordination and gait   Adult ECG Report - not checked   Recent Labs:  Latest lipids were from 10/03/2014 -> checked in April by PCP (not available) Labs obtained from PCP after clinic visit -- will add as addendum once reviewed.   ASSESSMENT /  PLAN:  Problem List Items Addressed This Visit    Nonocclusive coronary atherosclerosis of native coronary artery - Primary (Chronic)    Cardiac catheterizations of indicated that her RCA is stable with no further dissection. She's had negative Myoview's in the past since the catheterizations. I think she has no significant coronary disease and her symptoms may only be related to spasm. I would not want to do heart catheterization on her anytime soon unless she has definitive symptoms with a positive stress test or troponin levels.  Symptoms could be microvascular or coronary spasm related.  Increase Imdur up to 60 mg      Relevant Medications   isosorbide mononitrate (IMDUR) 60 MG 24 hr tablet   furosemide (LASIX) 20 MG tablet   Other Relevant Orders   CBC (Completed)   Lipid panel (Completed)   Comprehensive metabolic panel (Completed)   Hyperlipidemia (Chronic)    On pravastatin. Monitored by PCP - but no labs have been checked recently.  Plan check CMP and lipid panel.      Relevant Medications   isosorbide mononitrate (IMDUR) 60 MG 24 hr tablet   furosemide (LASIX) 20 MG tablet   Other Relevant Orders   CBC (Completed)   Lipid panel (Completed)   Comprehensive metabolic panel (Completed)   Essential hypertension (Chronic)    Well-controlled now on enalapril low-dose along with labetalol. -- At this point and leave alone, but would not be a bad idea to use a different medication in the future.      Relevant Medications   isosorbide mononitrate (IMDUR) 60 MG 24 hr tablet   furosemide (LASIX) 20 MG tablet   Chest pain with moderate risk for cardiac etiology    While I don't think that this symptom is from fixed coronary artery disease, it could be related to coronary spasm versus just simply anxiety. Since she did notice some improvement on the lower dose of Imdur, [place]an increase her to 60 mg Imdur.      Bilateral lower extremity edema (Chronic)    May use furosemide 20  mg daily or more frequently if edema is worse.  I don't  think is related to heart failure, may be more due to venous stasis.      Accelerated hypertension   Relevant Medications   isosorbide mononitrate (IMDUR) 60 MG 24 hr tablet   furosemide (LASIX) 20 MG tablet   Other Relevant Orders   CBC (Completed)   Lipid panel (Completed)   Comprehensive metabolic panel (Completed)    Other Visit Diagnoses   None.     Orders Placed This Encounter  Procedures  . CBC  . Lipid panel    Order Specific Question:   Has the patient fasted?    Answer:   Yes  . Comprehensive metabolic panel    Order Specific Question:   Has the patient fasted?    Answer:   Yes    PATIENT INSTRUCTIONS: MEDICATION CHANGES--  INCREASE ISOSORBIDE MONO- 60 MG ONE TABLET DAILY  MAY USE FUROSEMIDE 20 MG - ONE TABLET DAILY IF SWELLING OCCURS AS NEEDED.   PLEASE HAVE LABS - CMP ,LIPIDS, CBC - DO NOT DRINK OR EAT THE MORNING OF THE TEST   Your physician wants you to follow-up in: FEB 2018 WITH DR Sanjuana Kava, M.D., M.S. Interventional Cardiologist   Pager # [phone] Phone # [phone] [address] [place], [place] [address]

## 2016-09-10 ENCOUNTER — Encounter: Payer: Self-pay | Admitting: Cardiology

## 2016-09-10 DIAGNOSIS — R600 Localized edema: Secondary | ICD-10-CM | POA: Insufficient documentation

## 2016-09-10 NOTE — Assessment & Plan Note (Signed)
Cardiac catheterizations of indicated that her RCA is stable with no further dissection. She's had negative Myoview's in the past since the catheterizations. I think she has no significant coronary disease and her symptoms may only be related to spasm. I would not want to do heart catheterization on her anytime soon unless she has definitive symptoms with a positive stress test or troponin levels.  Symptoms could be microvascular or coronary spasm related.  Increase Imdur up to 60 mg

## 2016-09-10 NOTE — Assessment & Plan Note (Addendum)
On pravastatin. Monitored by PCP - but no labs have been checked recently.  Plan check CMP and lipid panel.

## 2016-09-10 NOTE — Assessment & Plan Note (Signed)
May use furosemide 20 mg daily or more frequently if edema is worse.  I don't think is related to heart failure, may be more due to venous stasis.

## 2016-09-10 NOTE — Assessment & Plan Note (Signed)
While I don't think that this symptom is from fixed coronary artery disease, it could be related to coronary spasm versus just simply anxiety. Since she did notice some improvement on the lower dose of Imdur, McG[place]an increase her to 60 mg Imdur.

## 2016-09-10 NOTE — Assessment & Plan Note (Signed)
Well-controlled now on enalapril low-dose along with labetalol. -- At this point and leave alone, but would not be a bad idea to use a different medication in the future.

## 2016-10-03 ENCOUNTER — Telehealth: Payer: Self-pay | Admitting: Cardiology

## 2016-10-03 ENCOUNTER — Encounter (HOSPITAL_COMMUNITY): Payer: Self-pay | Admitting: Emergency Medicine

## 2016-10-03 ENCOUNTER — Emergency Department (HOSPITAL_COMMUNITY)
Admission: EM | Admit: 2016-10-03 | Discharge: 2016-10-03 | Disposition: A | Discharge status: Home / Self Care | Payer: Medicare Other | Attending: Emergency Medicine | Admitting: Emergency Medicine

## 2016-10-03 ENCOUNTER — Emergency Department (HOSPITAL_COMMUNITY): Payer: Medicare Other

## 2016-10-03 DIAGNOSIS — M7989 Other specified soft tissue disorders: Secondary | ICD-10-CM | POA: Insufficient documentation

## 2016-10-03 DIAGNOSIS — J449 Chronic obstructive pulmonary disease, unspecified: Secondary | ICD-10-CM | POA: Insufficient documentation

## 2016-10-03 DIAGNOSIS — R42 Dizziness and giddiness: Secondary | ICD-10-CM | POA: Diagnosis not present

## 2016-10-03 DIAGNOSIS — R61 Generalized hyperhidrosis: Secondary | ICD-10-CM | POA: Diagnosis not present

## 2016-10-03 DIAGNOSIS — R5383 Other fatigue: Secondary | ICD-10-CM | POA: Diagnosis not present

## 2016-10-03 DIAGNOSIS — R05 Cough: Secondary | ICD-10-CM | POA: Diagnosis not present

## 2016-10-03 DIAGNOSIS — R0602 Shortness of breath: Secondary | ICD-10-CM | POA: Insufficient documentation

## 2016-10-03 DIAGNOSIS — I10 Essential (primary) hypertension: Secondary | ICD-10-CM | POA: Insufficient documentation

## 2016-10-03 DIAGNOSIS — R079 Chest pain, unspecified: Secondary | ICD-10-CM | POA: Diagnosis not present

## 2016-10-03 DIAGNOSIS — E119 Type 2 diabetes mellitus without complications: Secondary | ICD-10-CM | POA: Insufficient documentation

## 2016-10-03 DIAGNOSIS — E039 Hypothyroidism, unspecified: Secondary | ICD-10-CM | POA: Insufficient documentation

## 2016-10-03 DIAGNOSIS — Z79899 Other long term (current) drug therapy: Secondary | ICD-10-CM | POA: Diagnosis not present

## 2016-10-03 DIAGNOSIS — Z87891 Personal history of nicotine dependence: Secondary | ICD-10-CM | POA: Insufficient documentation

## 2016-10-03 LAB — BASIC METABOLIC PANEL
ANION GAP: 4 — AB (ref 5–15)
BUN: 11 mg/dL (ref 6–20)
CALCIUM: 9.7 mg/dL (ref 8.9–10.3)
CO2: 31 mmol/L (ref 22–32)
Chloride: 104 mmol/L (ref 101–111)
Creatinine, Ser: 0.61 mg/dL (ref 0.44–1.00)
Glucose, Bld: 87 mg/dL (ref 65–99)
POTASSIUM: 4.2 mmol/L (ref 3.5–5.1)
Sodium: 139 mmol/L (ref 135–145)

## 2016-10-03 LAB — CBC
HEMATOCRIT: 46 % (ref 36.0–46.0)
HEMOGLOBIN: 14.9 g/dL (ref 12.0–15.0)
MCH: 29.4 pg (ref 26.0–34.0)
MCHC: 32.4 g/dL (ref 30.0–36.0)
MCV: 90.9 fL (ref 78.0–100.0)
Platelets: 178 10*3/uL (ref 150–400)
RBC: 5.06 MIL/uL (ref 3.87–5.11)
RDW: 12.6 % (ref 11.5–15.5)
WBC: 7.3 10*3/uL (ref 4.0–10.5)

## 2016-10-03 LAB — TROPONIN I

## 2016-10-03 MED ORDER — ASPIRIN 81 MG PO CHEW
324.0000 mg | CHEWABLE_TABLET | Freq: Once | ORAL | Status: AC
  Administered 2016-10-03: 324 mg via ORAL
  Filled 2016-10-03: qty 4

## 2016-10-03 MED ORDER — FENTANYL CITRATE (PF) 100 MCG/2ML IJ SOLN
50.0000 ug | INTRAMUSCULAR | Status: DC | PRN
  Administered 2016-10-03: 50 ug via INTRAVENOUS
  Filled 2016-10-03: qty 2

## 2016-10-03 NOTE — ED Triage Notes (Signed)
Pt reports pain in center of chest since Saturday radiating to left arm with sob since Saturday.  Pt still having tightness in chest at this time.  Pt alert and oriented at this time.

## 2016-10-03 NOTE — ED Notes (Signed)
Pt attempted to use the bedside commode, but was unable to void

## 2016-10-03 NOTE — Telephone Encounter (Signed)
Vickie [name] is calling because she is having some chest tightness and felt like waves going down her arm.

## 2016-10-03 NOTE — ED Notes (Signed)
EKG given to Dr. Zavitz 

## 2016-10-03 NOTE — Telephone Encounter (Signed)
SPoke to patient .  COMPLAIN OF CHEST PAIN TODAY AND YESTERDAY . PATIENT USED NTG YESTERDAY BUT NOT TODAY ,BECAUSE PATIENT STATES MEDICATION WAS EXPIRED. INFORMED PATIENT TO GO EMS BY 911 TO [place]. PATIENT VERBALIZED UDERSTANDING.

## 2016-10-03 NOTE — ED Provider Notes (Signed)
[facility] DEPT Provider Note   CSN: [id number] Arrival date & time: 10/03/16  1027  By signing my name below, I, Rayna Sexton, attest that this documentation has been prepared under the direction and in the presence of Elnora Morrison, MD. Electronically Signed: Rayna Sexton, ED Scribe. 10/03/16. 1:06 PM.   History   Chief Complaint Chief Complaint  Patient presents with  . Chest Pain    HPI HPI Comments: MARILLA RHEAMS is a 70 y.o. female with a h/o COPD, DM and MI who presents to the Emergency Department complaining of intermittent, moderate, central CP x 2 days. Her daughter states that she began experiencing her pain while sitting. She reports associated radiation of pain to her left arm, lightheadedness, diaphoresis and a "sinking feeling in her head". She took her BP at the time which was roughly 140/80 and had a HR in the low 100's and took 1 NTG. Since, she has experienced fatigue and intermittently experienced her CP and associated symptoms. Her daughter believes her last stress test was ~2 years ago and pt confirms she was recently evaluated by her cardiologist. Pt denies she has had Asprin today. Her daughter states she has been dx with pneumonia three times this year. Pt denies any recent surgeries or a h/o DVT/PE or CA. She denies cough, fever and chills.   Cardiologist: Dr. Ellyn Hack Pulmonologist: Dr. Luan Pulling   The history is provided by the patient. No language interpreter was used.    Past Medical History:  Diagnosis Date  . Anxiety disorder    With apparent panic attacks  . Arthritis   . COPD (chronic obstructive pulmonary disease) ([place])    PFTs were done in 2008 at [facility]  . Diabetes mellitus type II, non insulin dependent ([place])   . Essential hypertension   . Hiatal hernia   . Hyperlipidemia   . Nonocclusive coronary atherosclerosis of native coronary artery 2006 through 2012   multi caths 2012, 2006, [id number] ([id number] complicated by catheter-induced  dissection of small nondominant RCA, patent in 2009 with no residual abnormality); Myoview August 2013: LOW RISK, normal EF. ; Echocardiogram [name] - January 2014) moderate LVH, EF 55-65%. No significant valvular disease.  . OSA (obstructive sleep apnea) 9/25/ 2012   tested 2009; tetested sleep study 07/2011--titration 09/20/2011 now use Bi-PAP    Patient Active Problem List   Diagnosis Date Noted  . Bilateral lower extremity edema 09/10/2016  . Accelerated hypertension 09/10/2015  . Chest pain 08/20/2015  . Chest pain with moderate risk for cardiac etiology 06/18/2015  . Claudication ([place]) 06/18/2015  . Obesity (BMI 30-39.9) 04/19/2014  . Nonocclusive coronary atherosclerosis of native coronary artery   . Hyperlipidemia   . Dysphagia, unspecified(787.20) 02/17/2014  . Hypothyroidism 04/19/2008  . DIABETES MELLITUS 04/19/2008  . ANXIETY DEPRESSION 04/19/2008  . Essential hypertension 04/19/2008  . HEART ATTACK 04/19/2008  . GASTROESOPHAGEAL REFLUX DISEASE 04/19/2008  . ISCHEMIC COLITIS 04/19/2008  . RECTAL BLEEDING 04/19/2008  . ARTHRITIS 04/19/2008    Past Surgical History:  Procedure Laterality Date  . ABDOMINAL HYSTERECTOMY    . CARDIAC CATHETERIZATION  [phone]   Non-occlusice CAD - only 80% ostial SP1;  NON DOMINANNT  RCA (catheter insuced dissection with MI in 2007 --> resolved by 2009 cath)  . DOPPLER ECHOCARDIOGRAPHY  01/09/2013   at [facility]---showed moderate LVH, 55% to 65%  with no significant valve disease  . LIPOMA EXCISION     rt anterior abdomen  . NM MYOVIEW LTD  07/2012; July  2016   a) (Most recent of many) LOW RISK ; normal EF and normal perfusion , low risk;; b) EF 55-65%, LOW RISK - no ischemia or infarction  . PULMONARY FUNCTION TEST  11/19/2007   at [facility],    [place] History    No data available       Home Medications    Prior to Admission medications   Medication Sig Start Date End Date Taking? Authorizing Provider  albuterol  (PROAIR HFA) 108 (90 BASE) MCG/ACT inhaler Inhale 1 puff into the lungs every 6 (six) hours as needed for wheezing or shortness of breath.   Yes Historical Provider, MD  albuterol (PROVENTIL) (2.5 MG/3ML) 0.083% nebulizer solution Take 2.5 mg by nebulization as needed for wheezing or shortness of breath.   Yes Historical Provider, MD  ALPRAZolam Duanne Moron) 0.5 MG tablet Take 0.25 mg by mouth 2 (two) times daily.    Yes Historical Provider, MD  busPIRone (BUSPAR) 15 MG tablet Take 15 mg by mouth 2 (two) times daily.   Yes Historical Provider, MD  Calcium Carb-Cholecalciferol (CALCIUM 600 + D PO) Take 1 tablet by mouth 2 (two) times daily.   Yes Historical Provider, MD  Coenzyme Q10 (COQ10) 100 MG CAPS Take 100 mg by mouth every morning.   Yes Historical Provider, MD  CRANBERRY PO Take 1 capsule by mouth 2 (two) times daily. 3600mg    Yes Historical Provider, MD  enalapril (VASOTEC) 2.5 MG tablet Take 2.5 mg by mouth every evening.    Yes Historical Provider, MD  escitalopram (LEXAPRO) 20 MG tablet Take 20 mg by mouth every morning.    Yes Historical Provider, MD  FIBER COMPLETE PO Take 1 tablet by mouth 2 (two) times daily.   Yes Historical Provider, MD  furosemide (LASIX) 20 MG tablet Take 1 tablet (20 mg total) by mouth as needed. FOR SWELLING 09/09/16 12/08/16 Yes Leonie Man, MD  isosorbide mononitrate (IMDUR) 60 MG 24 hr tablet Take 1 tablet (60 mg total) by mouth daily. 09/09/16 12/08/16 Yes Leonie Man, MD  labetalol (NORMODYNE) 200 MG tablet Take one tablet as needed for a blood pressure >180 on the top number. Patient taking differently: Take 200 mg by mouth daily as needed. Take one tablet as needed for a blood pressure >180 on the top number. 09/08/15  Yes Leonie Man, MD  meloxicam (MOBIC) 7.5 MG tablet Take 7.5 mg by mouth 2 (two) times daily.   Yes Historical Provider, MD  mirtazapine (REMERON) 15 MG tablet Take 15 mg by mouth at bedtime.   Yes Historical Provider, MD  Multiple  Vitamin (MULTIVITAMIN) tablet Take 1 tablet by mouth daily.   Yes Historical Provider, MD  nitroGLYCERIN (NITROSTAT) 0.4 MG SL tablet Place 1 tablet (0.4 mg total) under the tongue every 5 (five) minutes as needed for chest pain. Max 3 doses. 06/16/15  Yes Leonie Man, MD  Omega-3 Fatty Acids (FISH OIL) 1200 MG CAPS Take 1 capsule by mouth at bedtime.   Yes Historical Provider, MD  pantoprazole (PROTONIX) 40 MG tablet TAKE ONE TABLET BY MOUTH ONCE DAILY. 12/03/15  Yes Leonie Man, MD  pravastatin (PRAVACHOL) 40 MG tablet TAKE ONE TABLET BY MOUTH DAILY. 07/27/16  Yes Leonie Man, MD  Tiotropium Bromide Monohydrate (SPIRIVA RESPIMAT) 2.5 MCG/ACT AERS Inhale 2.5 mcg into the lungs 2 (two) times daily.   Yes Historical Provider, MD    Family History Family History  Problem Relation Age of Onset  . CAD  Mother     Social History Social History  Substance Use Topics  . Smoking status: Former Smoker    Quit date: 04/18/1977  . Smokeless tobacco: Never Used  . Alcohol use No     Allergies   Avelox [moxifloxacin]; Bactrim [sulfamethoxazole-trimethoprim]; Levaquin [levofloxacin]; Mucinex [guaifenesin er]; Sudafed [pseudoephedrine hcl]; Adhesive [tape]; Crestor [rosuvastatin]; Lipitor [atorvastatin]; Norvasc [amlodipine]; Penicillins; and Vytorin [ezetimibe-simvastatin]  Review of Systems Review of Systems  Constitutional: Positive for diaphoresis and fatigue. Negative for chills and fever.  Respiratory: Positive for chest tightness and shortness of breath. Negative for cough.   Cardiovascular: Positive for chest pain.  Neurological: Positive for light-headedness.  All other systems reviewed and are negative.  Physical Exam Updated Vital Signs BP 117/73 (BP Location: Left Arm)   Pulse 70   Temp 99 F (37.2 C) (Oral)   Resp 18   Ht 4\' 11"  (1.499 m)   Wt 182 lb (82.6 kg)   SpO2 94%   BMI 36.76 kg/m   Physical Exam  Constitutional: She is oriented to person, place, and time.  She appears well-developed and well-nourished.  HENT:  Head: Normocephalic and atraumatic.  Eyes: EOM are normal.  Neck: Normal range of motion.  Cardiovascular: Normal rate and regular rhythm.   Pulmonary/Chest: Effort normal and breath sounds normal. No respiratory distress. She has no wheezes. She has no rales.  Abdominal: Soft. There is no tenderness.  No focal tenderness  Musculoskeletal: Normal range of motion. She exhibits edema.  Mild swelling noted to the bilateral ankles  Neurological: She is alert and oriented to person, place, and time.  Skin: Skin is warm and dry.  Psychiatric: She has a normal mood and affect.  Nursing note and vitals reviewed.  ED Treatments / Results  Labs (all labs ordered are listed, but only abnormal results are displayed) Labs Reviewed  BASIC METABOLIC PANEL - Abnormal; Notable for the following:       Result Value   Anion gap 4 (*)    All other components within normal limits  CBC  TROPONIN I  TROPONIN I  URINALYSIS, ROUTINE W REFLEX MICROSCOPIC (NOT AT [facility])    EKG  EKG Interpretation  Date/Time:  Monday October 03 2016 10:39:45 EDT Ventricular Rate:  75 PR Interval:  178 QRS Duration: 82 QT Interval:  392 QTC Calculation: 437 R Axis:   34 Text Interpretation:  Normal sinus rhythm Normal ECG Confirmed by Reather Converse MD, Kiannah Grunow [phone]) on 10/03/2016 12:52:05 PM       Radiology Dg Chest 2 View  Result Date: 10/03/2016 CLINICAL DATA:  70 year old female with central chest pain shortness breath and cough for 1 day. Initial encounter. EXAM: CHEST  2 VIEW COMPARISON:  02/15/2016 and earlier. FINDINGS: Stable large lung volumes. Stable mediastinal contours, within normal limits. Visualized tracheal air column is within normal limits. No pneumothorax, pulmonary edema, pleural effusion or acute pulmonary opacity. Osteopenia. Stable visualized osseous structures. Vacuum disc phenomena in the lower thoracic and visible lumbar spine. Calcified  abdominal aortic atherosclerosis. IMPRESSION: 1.  No acute cardiopulmonary abnormality. 2.  Calcified aortic atherosclerosis. Electronically Signed   By: Genevie Ann M.D.   On: 10/03/2016 11:11   Procedures Procedures  DIAGNOSTIC STUDIES: Oxygen Saturation is 95% on RA, normal by my interpretation.    COORDINATION OF CARE: 1:03 PM Discussed next steps with pt. Pt verbalized understanding and is agreeable with the plan.    Medications Ordered in ED Medications  fentaNYL (SUBLIMAZE) injection 50 mcg (50 mcg Intravenous Given  10/03/16 1350)  aspirin chewable tablet 324 mg (324 mg Oral Given 10/03/16 1406)     Initial Impression / Assessment and Plan / ED Course  I have reviewed the triage vital signs and the nursing notes.  Pertinent labs & imaging results that were available during my care of the patient were reviewed by me and considered in my medical decision making (see chart for details).  Clinical Course   Patient presents with intermittent chest pain and left arm radiation since Saturday. Symptoms mild initially and resolved on reassessment. Patient has had a stress test multiple times the past last stress test reviewed unremarkable. Patient is had a cath and had an injury to one of the vessels during the cath per family report and was told she likely would have a repeat one.  Plan for delta troponin. Paged cardiology to discuss close outpatient follow-up Card recommended outpt fup.  Results and differential diagnosis were discussed with the patient/parent/guardian. Xrays were independently reviewed by myself.  Close follow up outpatient was discussed, comfortable with the plan.   Medications  fentaNYL (SUBLIMAZE) injection 50 mcg (50 mcg Intravenous Given 10/03/16 1350)  aspirin chewable tablet 324 mg (324 mg Oral Given 10/03/16 1406)    Vitals:   10/03/16 1330 10/03/16 1400 10/03/16 1430 10/03/16 1504  BP: 132/88 146/91 132/83 117/73  Pulse: 67 68 71 70  Resp: 14 12 12 18     Temp:      TempSrc:      SpO2: 93% 91% 92% 94%  Weight:      Height:        Final diagnoses:  Chest pain, unspecified type    Final Clinical Impressions(s) / ED Diagnoses   Final diagnoses:  Chest pain, unspecified type    New Prescriptions New Prescriptions   No medications on file     Elnora Morrison, MD 10/03/16 1619

## 2016-10-03 NOTE — Discharge Instructions (Signed)
Follow closely with cardiology this week.  If you were given medicines take as directed.  If you are on coumadin or contraceptives realize their levels and effectiveness is altered by many different medicines.  If you have any reaction (rash, tongues swelling, other) to the medicines stop taking and see a physician.    If your blood pressure was elevated in the ER make sure you follow up for management with a primary doctor or return for chest pain, shortness of breath or stroke symptoms.  Please follow up as directed and return to the ER or see a physician for new or worsening symptoms.  Thank you. Vitals:   10/03/16 1330 10/03/16 1400 10/03/16 1430 10/03/16 1504  BP: 132/88 146/91 132/83 117/73  Pulse: 67 68 71 70  Resp: 14 12 12 18   Temp:      TempSrc:      SpO2: 93% 91% 92% 94%  Weight:      Height:

## 2016-10-05 ENCOUNTER — Encounter: Payer: Self-pay | Admitting: Cardiology

## 2016-10-05 ENCOUNTER — Ambulatory Visit (INDEPENDENT_AMBULATORY_CARE_PROVIDER_SITE_OTHER): Payer: Medicare Other | Admitting: Cardiology

## 2016-10-05 ENCOUNTER — Other Ambulatory Visit: Payer: Self-pay | Admitting: Cardiology

## 2016-10-05 VITALS — BP 129/75 | HR 80 | Ht 59.0 in | Wt 182.6 lb

## 2016-10-05 DIAGNOSIS — R079 Chest pain, unspecified: Secondary | ICD-10-CM | POA: Diagnosis not present

## 2016-10-05 DIAGNOSIS — I10 Essential (primary) hypertension: Secondary | ICD-10-CM

## 2016-10-05 DIAGNOSIS — E669 Obesity, unspecified: Secondary | ICD-10-CM

## 2016-10-05 DIAGNOSIS — I2510 Atherosclerotic heart disease of native coronary artery without angina pectoris: Secondary | ICD-10-CM | POA: Diagnosis not present

## 2016-10-05 DIAGNOSIS — R002 Palpitations: Secondary | ICD-10-CM

## 2016-10-05 MED ORDER — FELODIPINE ER 2.5 MG PO TB24: 2.5000 mg | ORAL_TABLET | Freq: Every day | ORAL | 6 refills | Status: DC

## 2016-10-05 MED ORDER — NITROGLYCERIN 0.4 MG SL SUBL: 0.4000 mg | SUBLINGUAL_TABLET | SUBLINGUAL | 4 refills | Status: DC | PRN

## 2016-10-05 NOTE — Telephone Encounter (Signed)
Rx request sent to pharmacy.  

## 2016-10-05 NOTE — Assessment & Plan Note (Signed)
BMI 36.8

## 2016-10-05 NOTE — Progress Notes (Signed)
10/05/2016 Vickie [name]   [birth year]  [id number]  Primary Physician Purvis Kilts, MD Primary Cardiologist: Dr Ellyn Hack  HPI:  70 y/o obese female followed by Dr Ellyn Hack, previously Dr Rollene Fare. She has had multiple caths as noted showing no significant disease. She had dissection with MI during her 2007 cath, noted to be patent in 2009. Multiple nuclear stress tests have been negative. It is suspected she may have coronary spasm though this has not been documented by Troponin. The pt saw Dr Ellyn Hack in [place] complaining of chest pain that last "30 seconds" but radiates to her arms and afterwards she gets pale, sweaty, and weak. Her daughter in a Therapist, sports (worked in the oncology office at [employer]). The pt's Imdur was increased when she saw Dr Ellyn Hack, he has no plans for any further caths unless she had documented EKG changes of an MI or elevated Troponin. She is in the office today after her ED visit 10/9. She says increasing the Imdur had no appreciable effect. Her symptoms are non exertional. She admits she has some associated tachycardia with these episodes. She recently started taking Centrum Silver, but otherwise no other obvious OTC medication culprits.    Current Outpatient Prescriptions  Medication Sig Dispense Refill  . albuterol (PROAIR HFA) 108 (90 BASE) MCG/ACT inhaler Inhale 1 puff into the lungs every 6 (six) hours as needed for wheezing or shortness of breath.    Marland Kitchen albuterol (PROVENTIL) (2.5 MG/3ML) 0.083% nebulizer solution Take 2.5 mg by nebulization as needed for wheezing or shortness of breath.    . ALPRAZolam (XANAX) 0.5 MG tablet Take 0.25 mg by mouth 2 (two) times daily.     . busPIRone (BUSPAR) 15 MG tablet Take 15 mg by mouth 2 (two) times daily.    . Calcium Carb-Cholecalciferol (CALCIUM 600 + D PO) Take 1 tablet by mouth 2 (two) times daily.    . Coenzyme Q10 (COQ10) 100 MG CAPS Take 100 mg by mouth every morning.    Marland Kitchen CRANBERRY PO Take 1 capsule by mouth 2 (two) times  daily. 3600mg     . enalapril (VASOTEC) 2.5 MG tablet Take 2.5 mg by mouth every evening.     . escitalopram (LEXAPRO) 20 MG tablet Take 20 mg by mouth every morning.     Marland Kitchen FIBER COMPLETE PO Take 1 tablet by mouth 2 (two) times daily.    . furosemide (LASIX) 20 MG tablet Take 1 tablet (20 mg total) by mouth as needed. FOR SWELLING 30 tablet 6  . isosorbide mononitrate (IMDUR) 60 MG 24 hr tablet Take 1 tablet (60 mg total) by mouth daily. 30 tablet 11  . labetalol (NORMODYNE) 200 MG tablet Take one tablet as needed for a blood pressure >180 on the top number. (Patient taking differently: Take 200 mg by mouth daily as needed. Take one tablet as needed for a blood pressure >180 on the top number.) 15 tablet 2  . meloxicam (MOBIC) 7.5 MG tablet Take 7.5 mg by mouth 2 (two) times daily.    . mirtazapine (REMERON) 15 MG tablet Take 15 mg by mouth at bedtime.    . Multiple Vitamin (MULTIVITAMIN) tablet Take 1 tablet by mouth daily.    . nitroGLYCERIN (NITROSTAT) 0.4 MG SL tablet Place 1 tablet (0.4 mg total) under the tongue every 5 (five) minutes as needed for chest pain. Max 3 doses. 25 tablet 3  . Omega-3 Fatty Acids (FISH OIL) 1200 MG CAPS Take 1 capsule by mouth at bedtime.    Marland Kitchen  pantoprazole (PROTONIX) 40 MG tablet TAKE ONE TABLET BY MOUTH ONCE DAILY. 30 tablet 10  . pravastatin (PRAVACHOL) 40 MG tablet TAKE ONE TABLET BY MOUTH DAILY. 30 tablet 2  . Tiotropium Bromide Monohydrate (SPIRIVA RESPIMAT) 2.5 MCG/ACT AERS Inhale 2.5 mcg into the lungs 2 (two) times daily.    . felodipine (PLENDIL) 2.5 MG 24 hr tablet Take 1 tablet (2.5 mg total) by mouth daily. 30 tablet 6   No current facility-administered medications for this visit.     Allergies  Allergen Reactions  . Avelox [Moxifloxacin] Anaphylaxis  . Bactrim [Sulfamethoxazole-Trimethoprim] Shortness Of Breath and Rash    REACTION: Choking, inability to swallow, redness  . Levaquin [Levofloxacin] Shortness Of Breath, Rash and Other (See  Comments)    Reaction:Choking Brand name Levaquin ok per pt  . Mucinex [Guaifenesin Er] Shortness Of Breath and Rash  . Sudafed [Pseudoephedrine Hcl] Shortness Of Breath, Rash and Other (See Comments)    REACTION: Choking, redness, inability to swallow  . Adhesive [Tape] Other (See Comments)    REACTION: redness/irritation at application site. **Certain bandages/adhesives cause this reaction**  . Crestor [Rosuvastatin] Other (See Comments)    LEG ACHING  . Lipitor [Atorvastatin]   . Norvasc [Amlodipine]   . Penicillins Other (See Comments)    REACTION: Faint ("passes out")  . Vytorin [Ezetimibe-Simvastatin]     Social History   Social History  . Marital status: Married    Spouse name: N/A  . Number of children: N/A  . Years of education: N/A   Occupational History  . Not on file.   Social History Main Topics  . Smoking status: Former Smoker    Quit date: 04/18/1977  . Smokeless tobacco: Never Used  . Alcohol use No  . Drug use: No  . Sexual activity: Not on file   Other Topics Concern  . Not on file   Social History Narrative   Married mother of 59, grandmother of 36. Her mother is 15 years old. Quit smoking 34 years ago. Does not drink alcohol.  Retired from [employer] in 2012.   Usually presents with oldest daughter.   Previously worked out at [employer] regularly walking 1/2-1 mile a day, but no longer able to do so because of the [employer] decision to no longer cover [employer] cost.     Review of Systems: General: negative for chills, fever, night sweats or weight changes.  Cardiovascular: negative for chest pain, dyspnea on exertion, edema, orthopnea, palpitations, paroxysmal nocturnal dyspnea or shortness of breath Dermatological: negative for rash Respiratory: negative for cough or wheezing Urologic: negative for hematuria Abdominal: negative for nausea, vomiting, diarrhea, bright red blood per rectum, melena, or  hematemesis Neurologic: negative for visual changes, syncope, or dizziness All other systems reviewed and are otherwise negative except as noted above.    Blood pressure 129/75, pulse 80, height 4\' 11"  (1.499 m), weight 182 lb 9.6 oz (82.8 kg).  General appearance: alert, cooperative, no distress and moderately obese Neck: no carotid bruit and no JVD Lungs: clear to auscultation bilaterally Heart: regular rate and rhythm Extremities: phocomelia in her hands Skin: Skin color, texture, turgor normal. No rashes or lesions Neurologic: Grossly normal  EKG NSR   ASSESSMENT AND PLAN:   Chest pain with moderate risk for cardiac etiology Pt has been having chest pain, seen by Dr Ellyn Hack 09/09/16, seen in the ED 10/03/16- EKG Troponin negative.   Nonocclusive coronary atherosclerosis of native coronary artery multiple caths-  2002, 2006, [id number] ([id number] Complicated by catheter-induced dissection of small nondominant RCA), patent in 2009 with no residual abnormality. Myoviews also negative, last one July 2016.  Essential hypertension Controlled  Obesity (BMI 30-39.9) BMI 36.8   PLAN  I suggested we try and add low dose Plendil for possible coronary spasm, she has had some type of intolerance to Amlodipine in the past but can't remember what. I suggested when obtain a 48 hr Holter in case she is having an arrhythmia causing her symptoms. Finally I suggested she stop her centrum Silver and take a regular multivitamin. In the past it has been noted by myself and Dr Rex Kras that some of our pts were having palpitations and other symptoms when taking Centrum Silver, she actually notes her symptoms starting around the time she started taking this. I suggested she see me in follow up in a couple of weeks if not better. I explained to the pt and her daughter that we had no plans for any further caths unless there was objective evidence of MI or ischemia. Previous stress tests have been negative and I  don't think another one is indicated now.   Kerin Ransom PA-C 10/05/2016 1:12 PM

## 2016-10-05 NOTE — Assessment & Plan Note (Signed)
Controlled.  

## 2016-10-05 NOTE — Patient Instructions (Addendum)
Medications:  START Felodipine 2.5 mg daily.  STOP Centrum Silver and switch to a regular multivitamin.  Testing:  Your physician has recommended that you wear a 48 hour holter monitor. Holter monitors are medical devices that record the heart's electrical activity. Doctors most often use these monitors to diagnose arrhythmias. Arrhythmias are problems with the speed or rhythm of the heartbeat. The monitor is a small, portable device. You can wear one while you do your normal daily activities. This is usually used to diagnose what is causing palpitations/syncope (passing out). To be put on at [address], [address].   Follow-Up:  Your physician recommends that you schedule a follow-up appointment--first available with Kerin Ransom, PA-C. You can call and cancel this appointment if your symptoms improve.  If you need a refill on your cardiac medications before your next appointment, please call your pharmacy.

## 2016-10-05 NOTE — Assessment & Plan Note (Signed)
Pt has been having chest pain, seen by Dr Ellyn Hack 09/09/16, seen in the ED 10/03/16- EKG Troponin negative.

## 2016-10-05 NOTE — Assessment & Plan Note (Signed)
multiple caths-  2002, 2006, [id number] ([id number] Complicated by catheter-induced dissection of small nondominant RCA), patent in 2009 with no residual abnormality. Myoviews also negative, last one July 2016.

## 2016-10-18 ENCOUNTER — Ambulatory Visit: Payer: Medicare Other | Admitting: Cardiology

## 2016-10-18 ENCOUNTER — Ambulatory Visit (INDEPENDENT_AMBULATORY_CARE_PROVIDER_SITE_OTHER): Payer: Medicare Other

## 2016-10-18 DIAGNOSIS — R002 Palpitations: Secondary | ICD-10-CM | POA: Diagnosis not present

## 2016-10-24 DIAGNOSIS — E1151 Type 2 diabetes mellitus with diabetic peripheral angiopathy without gangrene: Secondary | ICD-10-CM | POA: Diagnosis not present

## 2016-10-24 DIAGNOSIS — E1140 Type 2 diabetes mellitus with diabetic neuropathy, unspecified: Secondary | ICD-10-CM | POA: Diagnosis not present

## 2016-10-27 IMAGING — CR DG CHEST 2V
2 series · 2 of 2 positions shown · non-contrast
Comparison: 01/27/2016 and 10/11/2014 chest x-ray. 10/13/2015 chest
CT.

CLINICAL DATA: 69-year-old female with cough and congestion with
shortness of breath since [REDACTED] worse over the past 2 weeks.
COPD. Myocardial infarction. Diabetes. Hypertension. Subsequent
encounter.

EXAM:
CHEST  2 VIEW

[view not recorded (1 of 2)]
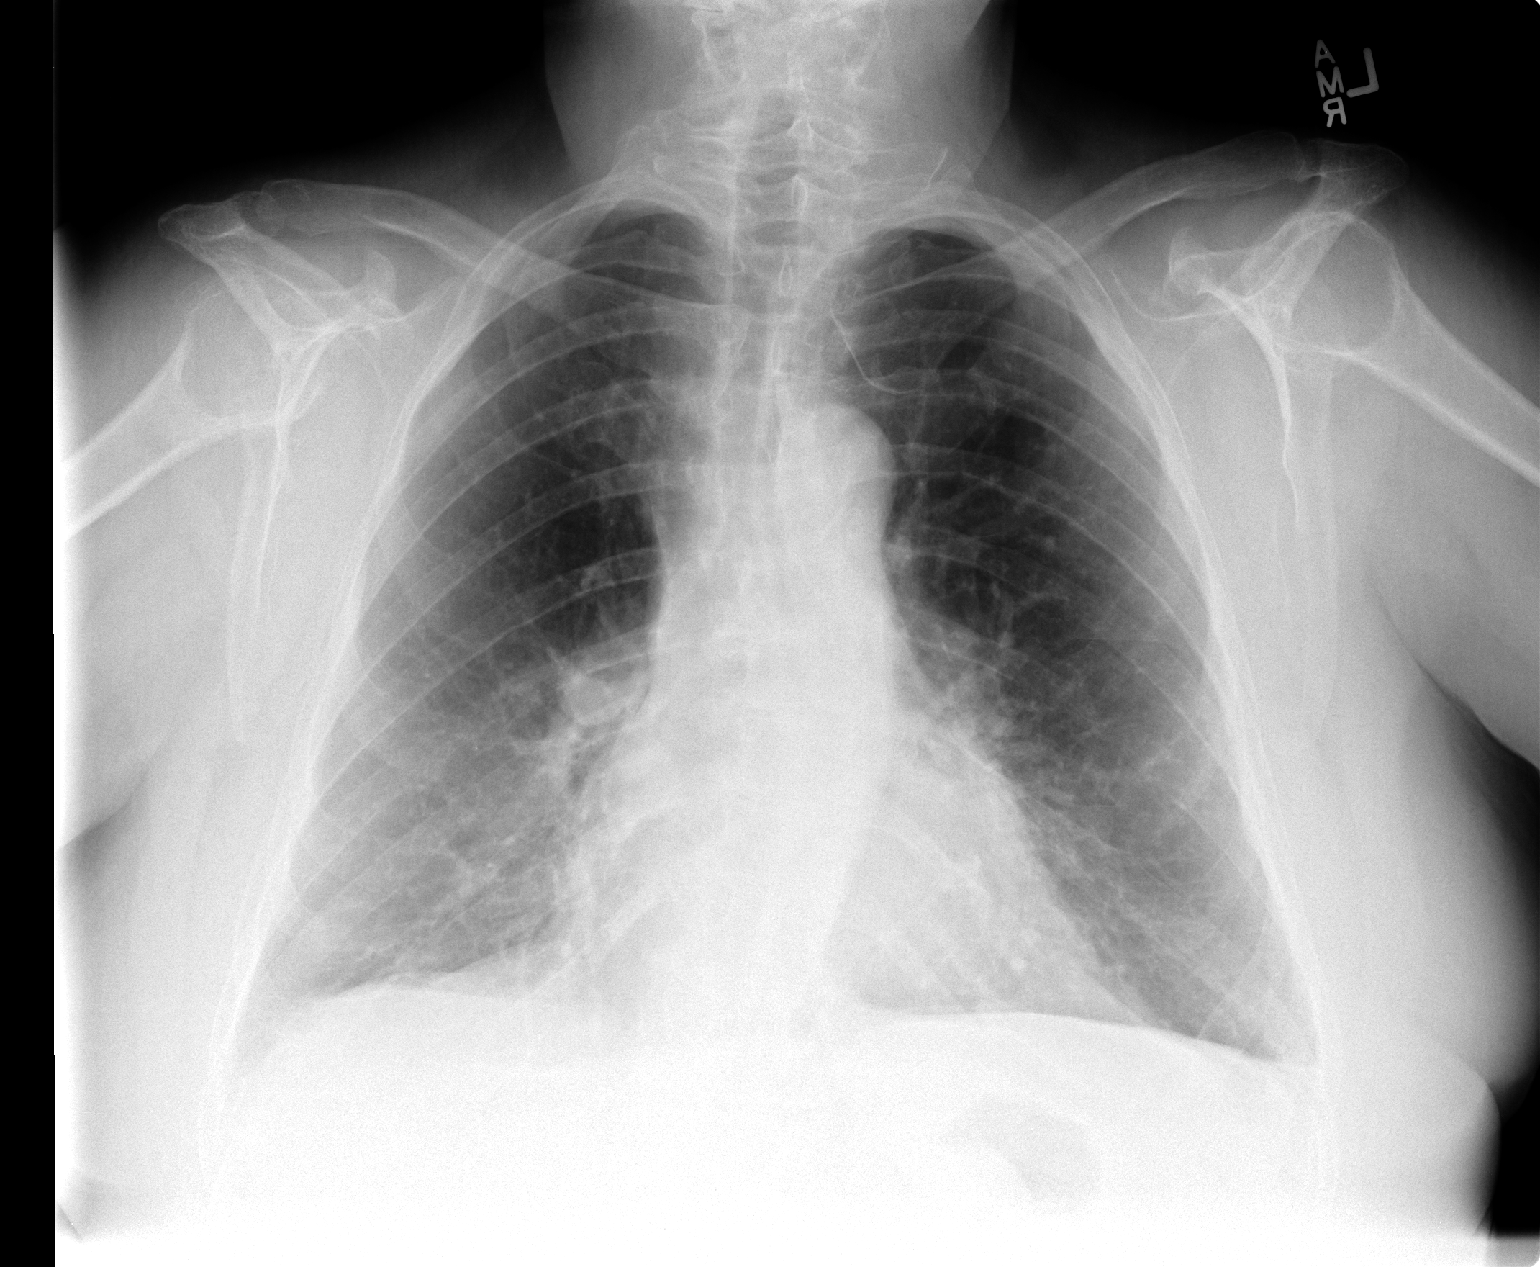

[view not recorded (2 of 2)]
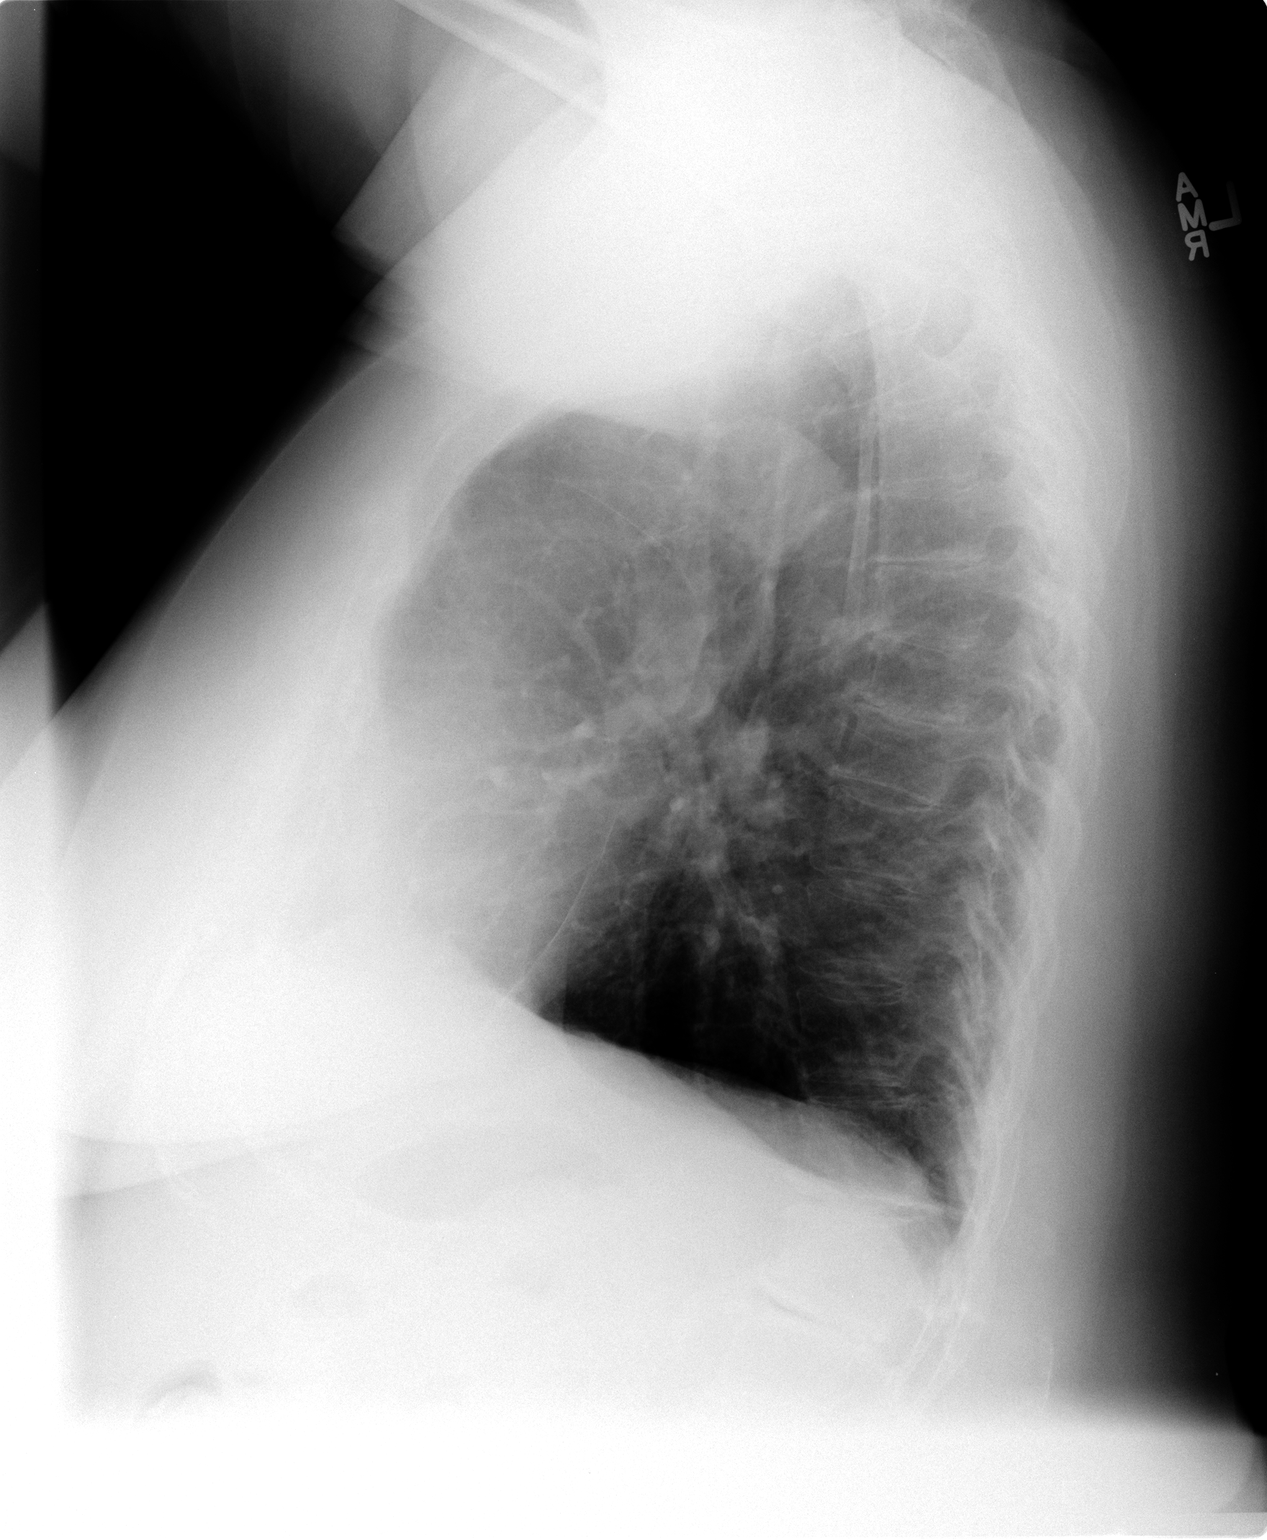

[2 of 2 positions shown; findings below may reference images not displayed]

FINDINGS: Chronic lung changes without infiltrate, congestive heart failure or
pneumothorax.

Minimal basilar subsegmental atelectasis.

Thoracic spine scoliosis and degenerative changes.

Heart size within normal limits.

Calcified mildly tortuous aorta.
IMPRESSION: Chronic lung changes without segmental infiltrate or pulmonary
edema.

Minimal basilar subsegmental atelectasis.

## 2016-10-31 ENCOUNTER — Ambulatory Visit (HOSPITAL_COMMUNITY)
Admission: RE | Admit: 2016-10-31 | Discharge: 2016-10-31 | Disposition: A | Discharge status: Home / Self Care | Payer: Medicare Other | Source: Ambulatory Visit | Attending: Pulmonary Disease | Admitting: Pulmonary Disease

## 2016-10-31 ENCOUNTER — Other Ambulatory Visit (HOSPITAL_COMMUNITY): Payer: Self-pay | Admitting: Pulmonary Disease

## 2016-10-31 DIAGNOSIS — M79652 Pain in left thigh: Secondary | ICD-10-CM | POA: Insufficient documentation

## 2016-10-31 DIAGNOSIS — J449 Chronic obstructive pulmonary disease, unspecified: Secondary | ICD-10-CM | POA: Diagnosis not present

## 2016-10-31 DIAGNOSIS — M545 Low back pain: Secondary | ICD-10-CM | POA: Diagnosis not present

## 2016-10-31 DIAGNOSIS — G4733 Obstructive sleep apnea (adult) (pediatric): Secondary | ICD-10-CM | POA: Diagnosis not present

## 2016-10-31 DIAGNOSIS — M47896 Other spondylosis, lumbar region: Secondary | ICD-10-CM | POA: Diagnosis not present

## 2016-10-31 DIAGNOSIS — I700 Atherosclerosis of aorta: Secondary | ICD-10-CM | POA: Insufficient documentation

## 2016-10-31 DIAGNOSIS — I10 Essential (primary) hypertension: Secondary | ICD-10-CM | POA: Diagnosis not present

## 2016-11-01 ENCOUNTER — Telehealth: Payer: Self-pay | Admitting: Cardiology

## 2016-11-01 NOTE — Telephone Encounter (Signed)
Spoke to patient regarding holter. Explained results still in process. She was very understanding of this and fine for this to be followed up whenever, but just wanted to make sure she knew status of report. Aware we will call when ready - she voiced thanks and understanding.

## 2016-11-01 NOTE — Telephone Encounter (Signed)
Pt is calling to get test results from heart monitor she wore 2 weeks ago

## 2016-11-02 ENCOUNTER — Other Ambulatory Visit: Payer: Self-pay | Admitting: Cardiology

## 2016-11-10 ENCOUNTER — Other Ambulatory Visit (HOSPITAL_COMMUNITY): Payer: Self-pay | Admitting: Pulmonary Disease

## 2016-11-10 DIAGNOSIS — Z8701 Personal history of pneumonia (recurrent): Secondary | ICD-10-CM

## 2016-11-16 ENCOUNTER — Ambulatory Visit (HOSPITAL_COMMUNITY)
Admission: RE | Admit: 2016-11-16 | Discharge: 2016-11-16 | Disposition: A | Discharge status: Home / Self Care | Payer: Medicare Other | Source: Ambulatory Visit | Attending: Pulmonary Disease | Admitting: Pulmonary Disease

## 2016-11-16 DIAGNOSIS — Z8701 Personal history of pneumonia (recurrent): Secondary | ICD-10-CM | POA: Insufficient documentation

## 2016-11-16 DIAGNOSIS — I2510 Atherosclerotic heart disease of native coronary artery without angina pectoris: Secondary | ICD-10-CM | POA: Insufficient documentation

## 2016-11-16 DIAGNOSIS — I700 Atherosclerosis of aorta: Secondary | ICD-10-CM | POA: Diagnosis not present

## 2016-11-16 DIAGNOSIS — J439 Emphysema, unspecified: Secondary | ICD-10-CM | POA: Diagnosis not present

## 2016-11-16 DIAGNOSIS — J189 Pneumonia, unspecified organism: Secondary | ICD-10-CM | POA: Diagnosis not present

## 2016-11-21 DIAGNOSIS — I213 ST elevation (STEMI) myocardial infarction of unspecified site: Secondary | ICD-10-CM | POA: Diagnosis not present

## 2016-11-21 DIAGNOSIS — K559 Vascular disorder of intestine, unspecified: Secondary | ICD-10-CM | POA: Diagnosis not present

## 2016-11-21 DIAGNOSIS — F330 Major depressive disorder, recurrent, mild: Secondary | ICD-10-CM | POA: Diagnosis not present

## 2016-11-21 DIAGNOSIS — Z6836 Body mass index (BMI) 36.0-36.9, adult: Secondary | ICD-10-CM | POA: Diagnosis not present

## 2016-11-21 DIAGNOSIS — L03116 Cellulitis of left lower limb: Secondary | ICD-10-CM | POA: Diagnosis not present

## 2016-11-21 DIAGNOSIS — E119 Type 2 diabetes mellitus without complications: Secondary | ICD-10-CM | POA: Diagnosis not present

## 2016-11-21 DIAGNOSIS — I739 Peripheral vascular disease, unspecified: Secondary | ICD-10-CM | POA: Diagnosis not present

## 2016-11-21 DIAGNOSIS — I872 Venous insufficiency (chronic) (peripheral): Secondary | ICD-10-CM | POA: Diagnosis not present

## 2016-11-21 DIAGNOSIS — E6609 Other obesity due to excess calories: Secondary | ICD-10-CM | POA: Diagnosis not present

## 2016-11-21 DIAGNOSIS — R600 Localized edema: Secondary | ICD-10-CM | POA: Diagnosis not present

## 2016-11-29 ENCOUNTER — Other Ambulatory Visit: Payer: Self-pay | Admitting: Cardiology

## 2016-11-30 IMAGING — US US EXTREM LOW VENOUS*R*
1 series · 13 of 24 positions shown · non-contrast
Comparison: None.

CLINICAL DATA: Pain swelling right lower extremity.



[Series 1: us extrem low venous*right* · 0.08mm/px · 13 of 33 slices shown]
[im 1/33]
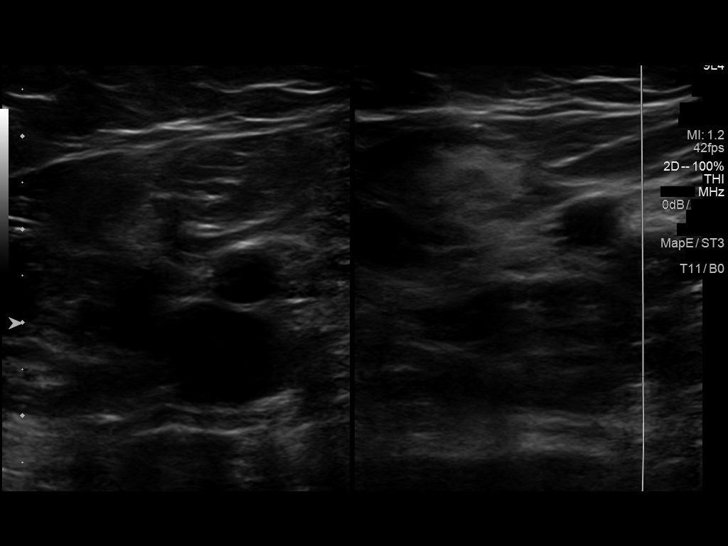
[im 3/33]
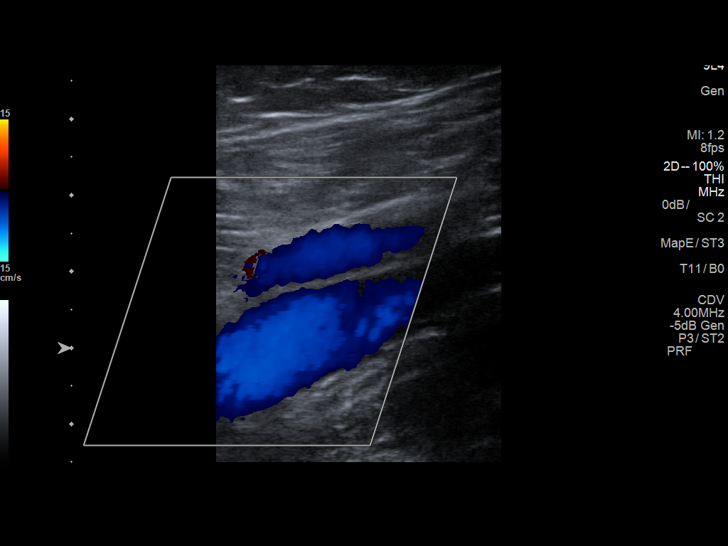
[im 6/33]
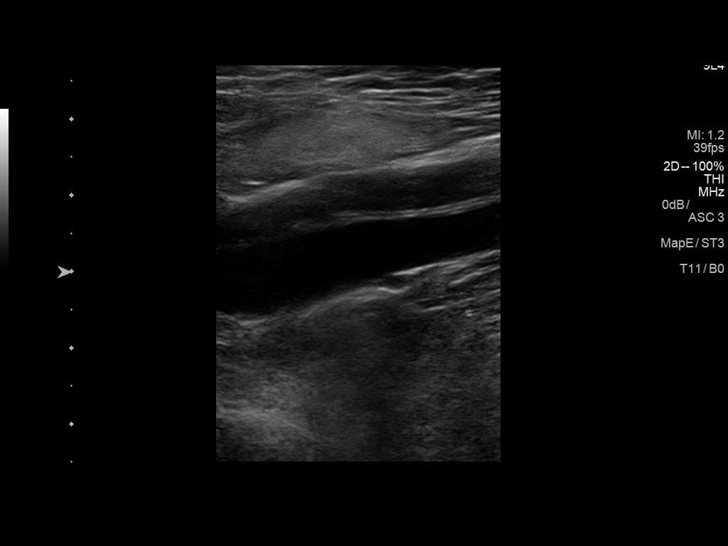
[im 9/33]
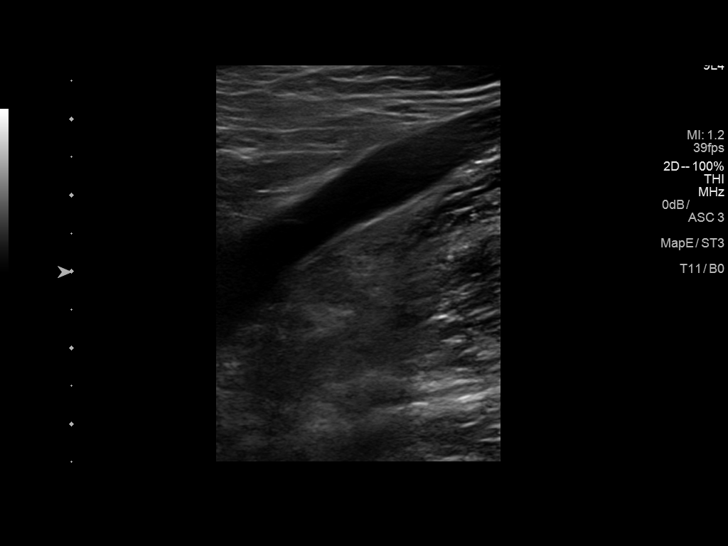
[im 12/33]
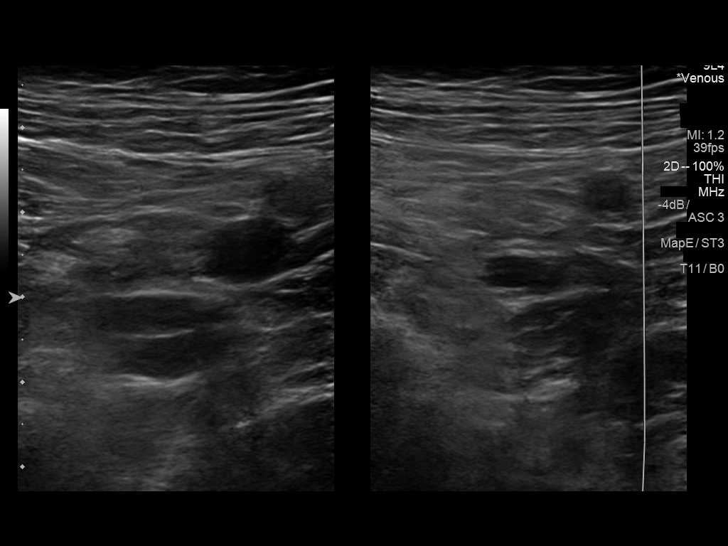
[im 14/33]
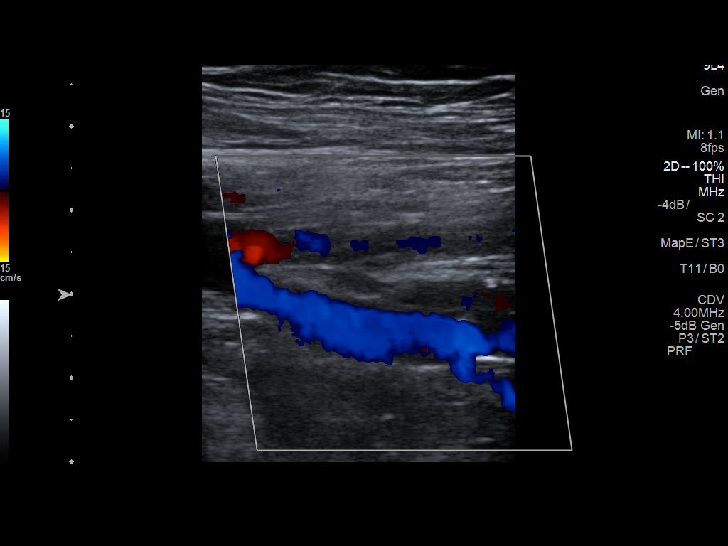
[im 17/33]
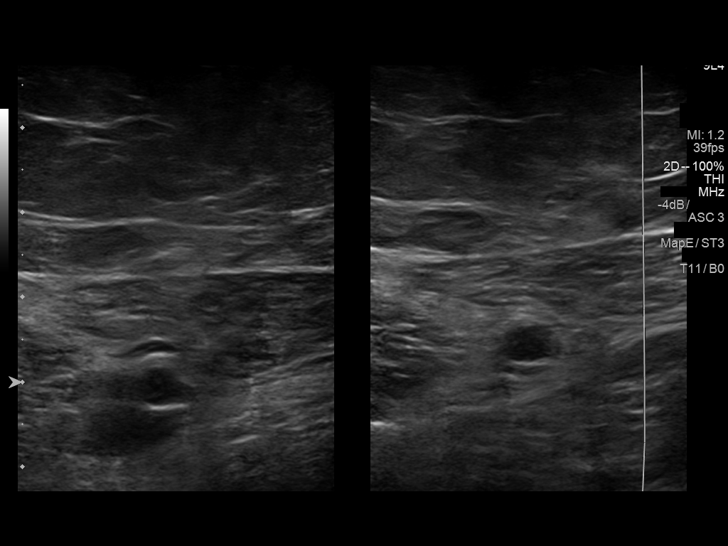
[im 19/33]
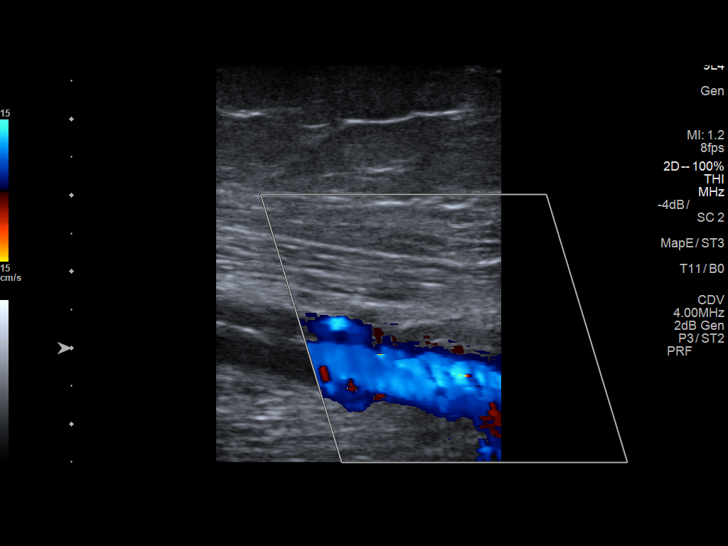
[im 21/33]
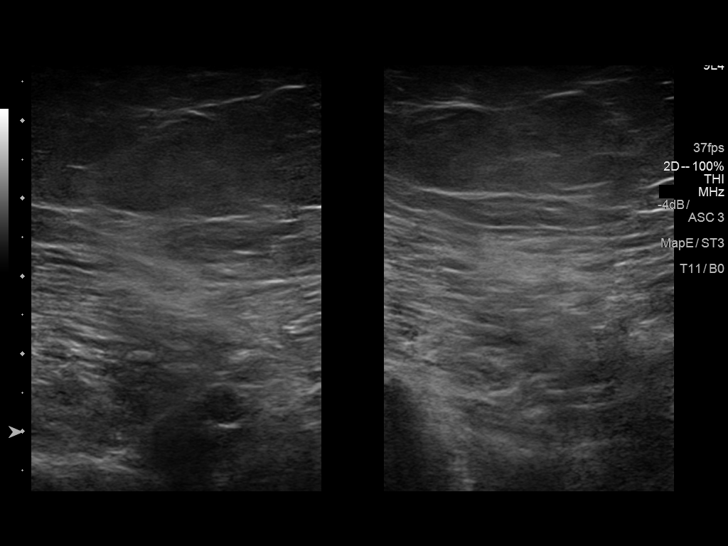
[im 24/33]
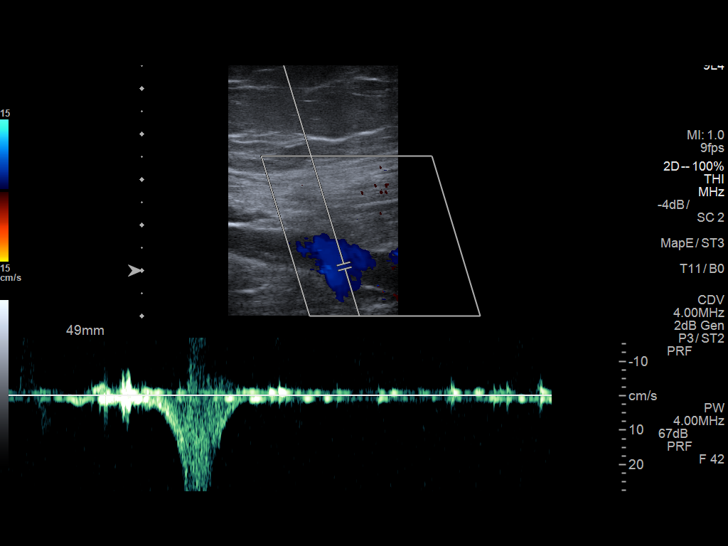
[im 27/33]
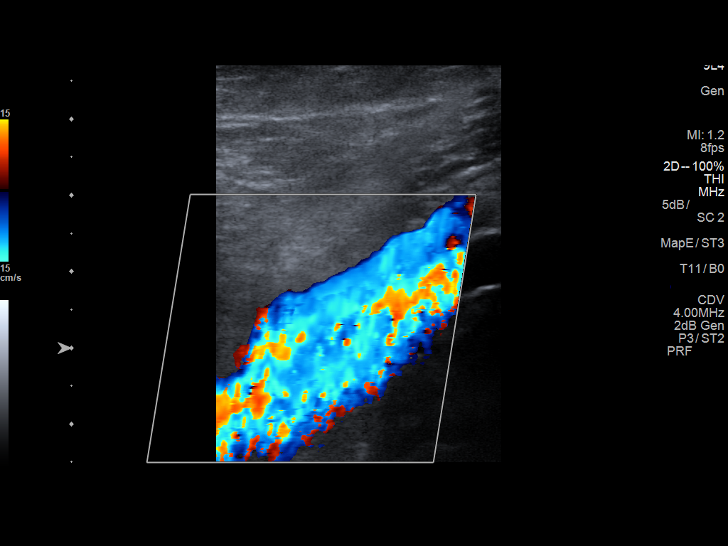
[im 30/33]
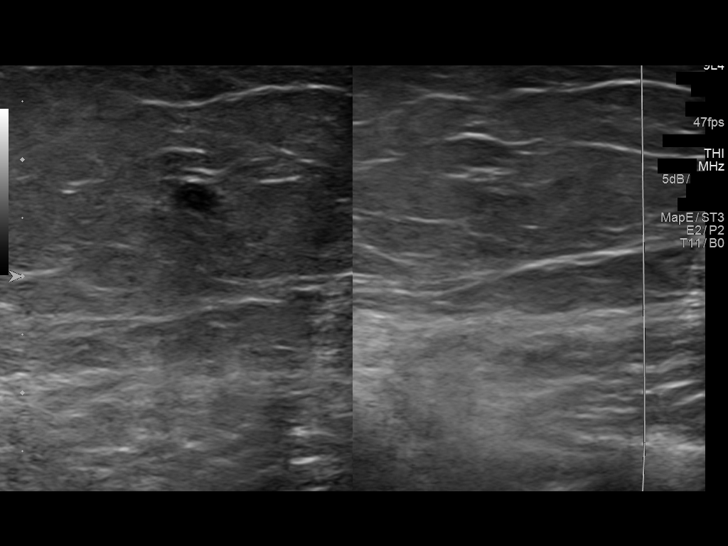
[im 33/33]
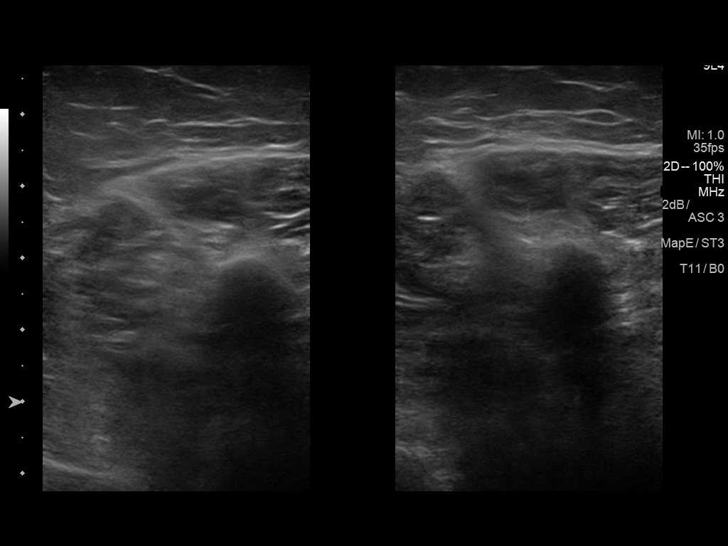

[13 of 24 positions shown; findings below may reference images not displayed]

FINDINGS: Contralateral Common Femoral Vein: Respiratory phasicity is normal
and symmetric with the symptomatic side. No evidence of thrombus.
Normal compressibility.

Common Femoral Vein: No evidence of thrombus. Normal
compressibility, respiratory phasicity and response to augmentation.

Saphenofemoral Junction: No evidence of thrombus. Normal
compressibility and flow on color Doppler imaging.

Profunda Femoral Vein: No evidence of thrombus. Normal
compressibility and flow on color Doppler imaging.

Femoral Vein: No evidence of thrombus. Normal compressibility,
respiratory phasicity and response to augmentation.

Popliteal Vein: No evidence of thrombus. Normal compressibility,
respiratory phasicity and response to augmentation.

Calf Veins: No evidence of thrombus. Normal compressibility and flow
on color Doppler imaging.

Superficial Great Saphenous Vein: No evidence of thrombus. Normal
compressibility and flow on color Doppler imaging.

Venous Reflux:  None.

Other Findings:  None.
IMPRESSION: No evidence of deep venous thrombosis.

## 2016-11-30 NOTE — Telephone Encounter (Signed)
Rx(s) sent to pharmacy electronically.  

## 2016-12-01 ENCOUNTER — Telehealth: Payer: Self-pay | Admitting: Cardiology

## 2016-12-01 NOTE — Telephone Encounter (Signed)
Returned call to patient.She stated she was calling for results of 48 hour holter monitor she wore 10/18/16.Advised monitor results not available.I will send message to [place].

## 2016-12-01 NOTE — Telephone Encounter (Signed)
New message   Pt verbalized that she wants results from 10/18/16

## 2016-12-05 NOTE — Telephone Encounter (Signed)
-----   Message from Leonie Man, MD sent at 12/01/2016  5:52 PM EST ----- Monitor showed mostly sinus rhythm. There are several episodes of 3-4 beats of either PACs or PVCs that could be symptomatic. No real true arrhythmias noted.  Can discuss in follow-up.  Glenetta Hew, MD   please forward to Dr. Sharilyn Sites

## 2016-12-05 NOTE — Telephone Encounter (Signed)
LEFT MESSAGE TO CALL BACK IN REGARDS TO MONITOR RESULTS

## 2016-12-06 NOTE — Telephone Encounter (Signed)
Spoke to patient. Result given . Verbalized understanding Patient request a copy of monitor to be mailed Informed patient will defer Dr Ellyn Hack TO SEE IF APPOINTMENT NEED TO SOONER THAN 01/2017 AS PLANNED.

## 2016-12-13 NOTE — Telephone Encounter (Signed)
LEFT MESSAGE - TO KEEP APPOINTMENT IN FEB 2018 , KNOW NEED TO COME SOONER.ANY QUESTION  MAY CALL BACK

## 2016-12-21 DIAGNOSIS — J209 Acute bronchitis, unspecified: Secondary | ICD-10-CM | POA: Diagnosis not present

## 2016-12-21 DIAGNOSIS — E6609 Other obesity due to excess calories: Secondary | ICD-10-CM | POA: Diagnosis not present

## 2016-12-21 DIAGNOSIS — Z1389 Encounter for screening for other disorder: Secondary | ICD-10-CM | POA: Diagnosis not present

## 2016-12-21 DIAGNOSIS — Z6833 Body mass index (BMI) 33.0-33.9, adult: Secondary | ICD-10-CM | POA: Diagnosis not present

## 2016-12-27 ENCOUNTER — Ambulatory Visit (HOSPITAL_COMMUNITY)
Admission: RE | Admit: 2016-12-27 | Discharge: 2016-12-27 | Disposition: A | Discharge status: Home / Self Care | Payer: Medicare Other | Source: Ambulatory Visit | Attending: Family Medicine | Admitting: Family Medicine

## 2016-12-27 ENCOUNTER — Other Ambulatory Visit (HOSPITAL_COMMUNITY): Payer: Self-pay | Admitting: Family Medicine

## 2016-12-27 DIAGNOSIS — Z6833 Body mass index (BMI) 33.0-33.9, adult: Secondary | ICD-10-CM | POA: Diagnosis not present

## 2016-12-27 DIAGNOSIS — I700 Atherosclerosis of aorta: Secondary | ICD-10-CM | POA: Diagnosis not present

## 2016-12-27 DIAGNOSIS — J209 Acute bronchitis, unspecified: Secondary | ICD-10-CM

## 2016-12-27 DIAGNOSIS — R918 Other nonspecific abnormal finding of lung field: Secondary | ICD-10-CM | POA: Insufficient documentation

## 2016-12-27 DIAGNOSIS — R05 Cough: Secondary | ICD-10-CM | POA: Diagnosis not present

## 2016-12-27 DIAGNOSIS — Z1389 Encounter for screening for other disorder: Secondary | ICD-10-CM | POA: Diagnosis not present

## 2016-12-27 DIAGNOSIS — J069 Acute upper respiratory infection, unspecified: Secondary | ICD-10-CM | POA: Diagnosis not present

## 2017-01-10 DIAGNOSIS — E1140 Type 2 diabetes mellitus with diabetic neuropathy, unspecified: Secondary | ICD-10-CM | POA: Diagnosis not present

## 2017-01-10 DIAGNOSIS — E1151 Type 2 diabetes mellitus with diabetic peripheral angiopathy without gangrene: Secondary | ICD-10-CM | POA: Diagnosis not present

## 2017-01-31 DIAGNOSIS — J449 Chronic obstructive pulmonary disease, unspecified: Secondary | ICD-10-CM | POA: Diagnosis not present

## 2017-01-31 DIAGNOSIS — J189 Pneumonia, unspecified organism: Secondary | ICD-10-CM | POA: Diagnosis not present

## 2017-01-31 DIAGNOSIS — G4733 Obstructive sleep apnea (adult) (pediatric): Secondary | ICD-10-CM | POA: Diagnosis not present

## 2017-01-31 DIAGNOSIS — I10 Essential (primary) hypertension: Secondary | ICD-10-CM | POA: Diagnosis not present

## 2017-02-03 ENCOUNTER — Other Ambulatory Visit: Payer: Self-pay | Admitting: Family Medicine

## 2017-02-03 DIAGNOSIS — Z1231 Encounter for screening mammogram for malignant neoplasm of breast: Secondary | ICD-10-CM

## 2017-02-08 ENCOUNTER — Ambulatory Visit (INDEPENDENT_AMBULATORY_CARE_PROVIDER_SITE_OTHER): Payer: Medicare Other | Admitting: Cardiology

## 2017-02-08 ENCOUNTER — Encounter: Payer: Self-pay | Admitting: Cardiology

## 2017-02-08 VITALS — BP 144/86 | HR 76 | Ht 59.0 in

## 2017-02-08 DIAGNOSIS — I2510 Atherosclerotic heart disease of native coronary artery without angina pectoris: Secondary | ICD-10-CM | POA: Diagnosis not present

## 2017-02-08 DIAGNOSIS — E785 Hyperlipidemia, unspecified: Secondary | ICD-10-CM

## 2017-02-08 DIAGNOSIS — I10 Essential (primary) hypertension: Secondary | ICD-10-CM

## 2017-02-08 DIAGNOSIS — E669 Obesity, unspecified: Secondary | ICD-10-CM

## 2017-02-08 DIAGNOSIS — R600 Localized edema: Secondary | ICD-10-CM

## 2017-02-08 DIAGNOSIS — Z79899 Other long term (current) drug therapy: Secondary | ICD-10-CM | POA: Diagnosis not present

## 2017-02-08 NOTE — Progress Notes (Signed)
PATIENT: Vickie [name] MRN: [id number]  DOB: [birth year]   DOV:02/13/2017 PCP: Purvis Kilts, MD  Pulmonologist:  Dr. Luan Pulling  Clinic Note: Chief Complaint  Patient presents with  . Follow-up    Monitor and 5 months  . Edema    Ankles and legs.  . Headache  . Chest Pain    Stable  . Shortness of Breath   HPI: Vickie [name] is a 71 y.o.  female former patient of Dr. Terance Ice, whom I initially saw in April 2015 for the first time.   She was a long-standing patient of Dr. Lowella Fairy with complaints of chronic intermittent chest pain. She has had several cardiac catheterizations and stress test. 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 Otherwise no significant coronary disease. Follow-up cath in 2009 showed resolution of the dissection and no further disease. She had a Myoviews in August 2012 & August 2013 for atypical pain that showed normal EF normal perfusion with low risk studies. She has significant anxiety and is troubled by the smallest things that cause her to be extremely upset -- extremely stressed out by her family. 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  Study Reviewed:   Event monitor noted several episodes of 3-4 beats of PACs/PVCs. No arrhythmia noted.  Last seen by me 08/2016. I increased her Imdur dose - apparently this had no appreciable effect.  Went to the emergency room 10/13/2016 for chest pain - ruled out for MI. Seen in follow-up of the [facility] on October 11.  Noted nonexertional chest discomfort as well as episodes of tachycardic palpitations. She was started on low-dose calcium channel blocker in order to 48 hour monitor.  Interval History:   Vickie [name] actually presents today doing quite well. She really hasn't had much in the way of chest pain recently has been  worrying her. She has stable exertional dyspnea, and is recovering from a bout of flu that took her somewhere through January to recover from. She had an episode yesterday where she felt somewhat dizzy and woozy, but had not really eaten yet and thinks that once she ate and drank something she felt much better. Otherwise no significant syncope or near-syncope symptoms. No TIA or amaurosis fugax symptoms. She is in a good mood today, because she she got plenty of sleep last night. This is the best night of sleep she's had in a month.  Cardiovascular ROS: positive for - intermittent sharp CP - but not much lately (indced by stress), baseline DOE negative for - edema, irregular heartbeat, orthopnea, palpitations, paroxysmal nocturnal dyspnea or rapid heart rate   Past Medical History:  Diagnosis Date  . Anxiety disorder    With apparent panic attacks  . Arthritis   . COPD (chronic obstructive pulmonary disease) ([place])    PFTs were done in 2008 at [facility]  . Diabetes mellitus type II, non insulin dependent ([place])   . Essential hypertension   . Hiatal hernia   . Hyperlipidemia   . Nonocclusive coronary atherosclerosis of native coronary artery 2006 through 2012   multi caths 2012, 2006, [id number] ([id number] complicated by catheter-induced dissection of small nondominant RCA, patent in 2009 with no residual abnormality); Myoview August 2013: LOW RISK, normal EF. ; Echocardiogram [name] - January 2014) moderate LVH, EF 55-65%. No significant valvular disease.  Marland Kitchen  OSA (obstructive sleep apnea) 9/25/ 2012   tested 2009; tetested sleep study 07/2011--titration 09/20/2011 now use Bi-PAP   Past Surgical History:  Procedure Laterality Date  . ABDOMINAL HYSTERECTOMY    . CARDIAC CATHETERIZATION  [phone]   Non-occlusice CAD - only 80% ostial SP1;  NON DOMINANNT  RCA (catheter insuced dissection with MI in 2007 --> resolved by 2009 cath)  . DOPPLER ECHOCARDIOGRAPHY  01/09/2013   at [facility]---showed moderate LVH, 55% to 65%  with no significant valve disease  . LIPOMA EXCISION     rt anterior abdomen  . NM MYOVIEW LTD  07/2012; July 2016   a) (Most recent of many) LOW RISK ; normal EF and normal perfusion , low risk;; b) EF 55-65%, LOW RISK - no ischemia or infarction  . PULMONARY FUNCTION TEST  11/19/2007   at [facility],    Allergies  Allergen Reactions  . Avelox [Moxifloxacin] Anaphylaxis  . Bactrim [Sulfamethoxazole-Trimethoprim] Shortness Of Breath and Rash    REACTION: Choking, inability to swallow, redness  . Levaquin [Levofloxacin] Shortness Of Breath, Rash and Other (See Comments)    Reaction:Choking Brand name Levaquin ok per pt  . Mucinex [Guaifenesin Er] Shortness Of Breath and Rash  . Sudafed [Pseudoephedrine Hcl] Shortness Of Breath, Rash and Other (See Comments)    REACTION: Choking, redness, inability to swallow  . Adhesive [Tape] Other (See Comments)    REACTION: redness/irritation at application site. **Certain bandages/adhesives cause this reaction**  . Crestor [Rosuvastatin] Other (See Comments)    LEG ACHING  . Lipitor [Atorvastatin]   . Norvasc [Amlodipine]   . Penicillins Other (See Comments)    REACTION: Faint ("passes out")  . Vytorin [Ezetimibe-Simvastatin]    Current Meds  Medication Sig  . albuterol (PROAIR HFA) 108 (90 BASE) MCG/ACT inhaler Inhale 1 puff into the lungs every 6 (six) hours as needed for wheezing or shortness of breath.  Marland Kitchen albuterol (PROVENTIL) (2.5 MG/3ML) 0.083% nebulizer solution Take 2.5 mg by nebulization as needed for wheezing or shortness of breath.  . ALPRAZolam (XANAX) 0.5 MG tablet Take 0.25 mg by mouth 2 (two) times daily.   . busPIRone (BUSPAR) 15 MG tablet Take 15 mg by mouth 2 (two) times daily.  . Calcium Carb-Cholecalciferol (CALCIUM 600 + D PO) Take 1 tablet by mouth 2 (two) times daily.  . Coenzyme Q10 (COQ10) 100 MG CAPS Take 100 mg by mouth every morning.  Marland Kitchen CRANBERRY PO Take 1 capsule by mouth 2  (two) times daily. 3600mg   . enalapril (VASOTEC) 2.5 MG tablet Take 2.5 mg by mouth every evening.   . escitalopram (LEXAPRO) 20 MG tablet Take 20 mg by mouth every morning.   . felodipine (PLENDIL) 2.5 MG 24 hr tablet Take 1 tablet (2.5 mg total) by mouth daily.  Marland Kitchen FIBER COMPLETE PO Take 1 tablet by mouth 2 (two) times daily.  Marland Kitchen labetalol (NORMODYNE) 200 MG tablet Take one tablet as needed for a blood pressure >180 on the top number. (Patient taking differently: Take 200 mg by mouth daily as needed. Take one tablet as needed for a blood pressure >180 on the top number.)  . meloxicam (MOBIC) 7.5 MG tablet Take 7.5 mg by mouth 2 (two) times daily.  . mirtazapine (REMERON) 15 MG tablet Take 15 mg by mouth at bedtime.  . Multiple Vitamin (MULTIVITAMIN) tablet Take 1 tablet by mouth daily.  . nitroGLYCERIN (NITROSTAT) 0.4 MG SL tablet Place 1 tablet (0.4 mg total) under the tongue every 5 (five)  minutes as needed for chest pain. Max 3 doses.  . Omega-3 Fatty Acids (FISH OIL) 1200 MG CAPS Take 1 capsule by mouth at bedtime.  . pantoprazole (PROTONIX) 40 MG tablet Take 1 tablet (40 mg total) by mouth daily.  . pravastatin (PRAVACHOL) 40 MG tablet Take 1 tablet (40 mg total) by mouth daily.  . Tiotropium Bromide Monohydrate (SPIRIVA RESPIMAT) 2.5 MCG/ACT AERS Inhale 2.5 mcg into the lungs 2 (two) times daily.   -she was told stop/hold her pravastatin While on Protonix.  Social History  Social History   Social History  . Marital status: Married    Spouse name: N/A  . Number of children: N/A  . Years of education: N/A   Social History Main Topics  . Smoking status: Former Smoker    Quit date: 04/18/1977  . Smokeless tobacco: Never Used  . Alcohol use No  . Drug use: No  . Sexual activity: Not Asked   Other Topics Concern  . None   Social History Narrative   Married mother of 4, grandmother of 3. Her mother is 53 years old. Quit smoking 34 years ago. Does not drink alcohol.  Retired from  [employer] in 2012.   Usually presents with oldest daughter.   Previously worked out at [employer] regularly walking 1/2-1 mile a day, but no longer able to do so because of the [employer] decision to no longer cover [employer] cost.    -- Significant Social Stressors: Her 2nd daughter who is the problem family member just up and left recently, and now she is having to care for her grand children. Her grandson is also in trouble. Her granddaughter is pregnant, and the mother is no where to be found.  Family History Family History  Problem Relation Age of Onset  . CAD Mother    no  Premature CAD  ROS: A comprehensive Review of Systems - was performed. Review of Systems  Constitutional: Negative for malaise/fatigue.  HENT: Negative for nosebleeds.   Respiratory: Positive for cough (Definintely better as she recovered from Flu.) and shortness of breath (She initially with activity or not with activity depending on how anxious she is.). Negative for sputum production and wheezing.   Cardiovascular: Positive for chest pain (much better with Plendil ), palpitations (Intermittent. Not lasting long - stable) and leg swelling (Mild edema of the day -- not enough to bother her.). Negative for claudication (Unclear the symptoms that she described and would like/calf cramping is related to claudication or pseudo--claudication) and PND.  Gastrointestinal: Positive for abdominal pain and heartburn (If she misses her PPI). Negative for blood in stool, melena, nausea and vomiting.  Genitourinary: Negative for hematuria.  Musculoskeletal: Positive for joint pain (knees and ankles  as well as hands). Negative for myalgias.  Neurological: Positive for headaches (With her hypertension episode). Negative for sensory change, speech change, focal weakness, seizures and loss of consciousness. Dizziness: Positional.  Endo/Heme/Allergies: Does not bruise/bleed easily.    Psychiatric/Behavioral: Positive for depression. Negative for memory loss. The patient is nervous/anxious and has insomnia (due to stress;, has not been using CPAP).        Mother died ~2 yrs ago. Dtr  Left. Husband has CAD  All other systems reviewed and are negative.   Wt Readings from Last 3 Encounters:  10/05/16 82.8 kg (182 lb 9.6 oz)  10/03/16 82.6 kg (182 lb)  09/09/16 82.6 kg (182 lb)    PHYSICAL EXAM BP (!) 144/86  Pulse 76   Ht 4\' 11"  (1.499 m)  General appearance: Pleasant, mildly obese woman. She is well-nourished and well-groomed. Somewhat lessanxious appearing; we aborted the topic of social stresses today.   Neck: no adenopathy, no carotid bruit, no JVD and supple, symmetrical, trachea midline Lungs: clear to auscultation bilaterally, normal percussion bilaterally and Nonlabored, good air movement Heart: RRR, normal S1 and S2 clear very soft 1/6 SEM at RUSB. No R./G. Abdomen: soft, non-tender; bowel sounds normal; no masses,  no organomegaly Extremities: extremities normal, atraumatic, no cyanosis or edema; phocomelia with multiple fingers including both thumbs absent Pulses: 2+ and symmetric Neurologic: Alert and oriented X 3, normal strength and tone. Normal symmetric reflexes. Normal coordination and gait   Adult ECG Report - not checked   Recent Labs: PCP follows Lipids  ASSESSMENT / PLAN:  Problem List Items Addressed This Visit    Bilateral lower extremity edema (Chronic)    Stable with current dose of Lasix. Does not seem to be related to heart failure. More related to venous stasis. We talked about support stockings.      Essential hypertension (Chronic)    Borderline control with the addition of felodipine. We do have room to increase that up to 5 mg if symptoms worsen.      Relevant Orders   Comprehensive metabolic panel (Completed)   Lipid panel (Completed)   EKG 12-Lead (Completed)   Hyperlipidemia LDL goal <70 (Chronic)    On pravastatin.  Plan to check lipid panel and CMP now.  She has agreed to participate in the [place] 10 study      Relevant Orders   Comprehensive metabolic panel (Completed)   Lipid panel (Completed)   Nonocclusive coronary atherosclerosis of native coronary artery - Primary (Chronic)    Trying to avoid any further cardiac catheterization with her because of her catheter-induced dissection of the RCA back in 2007. She's had several cardiac catheterizations followed by several Myoview's were negative for ischemia. She may very well have microvascular ischemia, but nothing noted on macrovascular evaluation. She is on beta blocker, ACE inhibitor, nitrate and Calcium Channel Blocker along with a statin. She is not taking aspirin because she is taking daily Mobic,      Relevant Orders   Comprehensive metabolic panel (Completed)   Lipid panel (Completed)   Obesity (BMI 30-39.9) (Chronic)   Relevant Orders   Comprehensive metabolic panel (Completed)   Lipid panel (Completed)    Other Visit Diagnoses    Medication management       Relevant Orders   Comprehensive metabolic panel (Completed)   Lipid panel (Completed)      Orders Placed This Encounter  Procedures  . Comprehensive metabolic panel    Order Specific Question:   Has the patient fasted?    Answer:   Yes  . Lipid panel    Order Specific Question:   Has the patient fasted?    Answer:   Yes  . EKG 12-Lead    Order Specific Question:   Where should this test be performed    Answer:   Other   Patient Instructions  No change with current  Treatment.   LABS -LIPID ,CMP-- DO NOT EAT OR DRINK THE MORNING OF THE TEST.   Your physician wants you to follow-up in 6 months with DR HARDING. You will receive a reminder letter in the mail two months in advance. If you don't receive a letter, please call our office to schedule the follow-up appointment.  If you need a refill on your cardiac medications before your next appointment, please call  your pharmacy.    Glenetta Hew, M.D., M.S. Interventional Cardiologist   Pager # [phone] Phone # [phone] [address] [place], [place] [address]

## 2017-02-08 NOTE — Patient Instructions (Addendum)
No change with current  Treatment.   LABS -LIPID ,CMP-- DO NOT EAT OR DRINK THE MORNING OF THE TEST.   Your physician wants you to follow-up in 6 months with DR HARDING. You will receive a reminder letter in the mail two months in advance. If you don't receive a letter, please call our office to schedule the follow-up appointment.   If you need a refill on your cardiac medications before your next appointment, please call your pharmacy.

## 2017-02-10 DIAGNOSIS — E669 Obesity, unspecified: Secondary | ICD-10-CM | POA: Diagnosis not present

## 2017-02-10 DIAGNOSIS — Z79899 Other long term (current) drug therapy: Secondary | ICD-10-CM | POA: Diagnosis not present

## 2017-02-10 DIAGNOSIS — I2510 Atherosclerotic heart disease of native coronary artery without angina pectoris: Secondary | ICD-10-CM | POA: Diagnosis not present

## 2017-02-10 DIAGNOSIS — I10 Essential (primary) hypertension: Secondary | ICD-10-CM | POA: Diagnosis not present

## 2017-02-11 LAB — COMPREHENSIVE METABOLIC PANEL
ALT: 15 IU/L (ref 0–32)
AST: 16 IU/L (ref 0–40)
Albumin/Globulin Ratio: 1.8 (ref 1.2–2.2)
Albumin: 4.2 g/dL (ref 3.6–4.8)
Alkaline Phosphatase: 63 IU/L (ref 39–117)
BILIRUBIN TOTAL: 0.4 mg/dL (ref 0.0–1.2)
BUN/Creatinine Ratio: 16 (ref 12–28)
BUN: 11 mg/dL (ref 8–27)
CALCIUM: 10.1 mg/dL (ref 8.7–10.3)
CHLORIDE: 101 mmol/L (ref 96–106)
CO2: 25 mmol/L (ref 18–29)
Creatinine, Ser: 0.7 mg/dL (ref 0.57–1.00)
GFR calc non Af Amer: 89 mL/min/{1.73_m2} (ref >59)
GFR, EST AFRICAN AMERICAN: 102 mL/min/{1.73_m2} (ref >59)
GLUCOSE: 103 mg/dL — AB (ref 65–99)
Globulin, Total: 2.3 g/dL (ref 1.5–4.5)
POTASSIUM: 4.3 mmol/L (ref 3.5–5.2)
Sodium: 141 mmol/L (ref 134–144)
TOTAL PROTEIN: 6.5 g/dL (ref 6.0–8.5)

## 2017-02-11 LAB — LIPID PANEL
Chol/HDL Ratio: 4.1 ratio units (ref 0.0–4.4)
Cholesterol, Total: 217 mg/dL — ABNORMAL HIGH (ref 100–199)
HDL: 53 mg/dL (ref >39)
LDL Calculated: 121 mg/dL — ABNORMAL HIGH (ref 0–99)
TRIGLYCERIDES: 214 mg/dL — AB (ref 0–149)
VLDL CHOLESTEROL CAL: 43 mg/dL — AB (ref 5–40)

## 2017-02-13 ENCOUNTER — Encounter: Payer: Self-pay | Admitting: Cardiology

## 2017-02-13 NOTE — Assessment & Plan Note (Signed)
Trying to avoid any further cardiac catheterization with her because of her catheter-induced dissection of the RCA back in 2007. She's had several cardiac catheterizations followed by several Myoview's were negative for ischemia. She may very well have microvascular ischemia, but nothing noted on macrovascular evaluation. She is on beta blocker, ACE inhibitor, nitrate and Calcium Channel Blocker along with a statin. She is not taking aspirin because she is taking daily Mobic,

## 2017-02-13 NOTE — Assessment & Plan Note (Signed)
Stable with current dose of Lasix. Does not seem to be related to heart failure. More related to venous stasis. We talked about support stockings.

## 2017-02-13 NOTE — Assessment & Plan Note (Signed)
On pravastatin. Plan to check lipid panel and CMP now.  She has agreed to participate in the [place] 10 study

## 2017-02-13 NOTE — Assessment & Plan Note (Signed)
Borderline control with the addition of felodipine. We do have room to increase that up to 5 mg if symptoms worsen.

## 2017-02-15 ENCOUNTER — Encounter: Payer: Self-pay | Admitting: Cardiology

## 2017-02-15 ENCOUNTER — Encounter: Payer: Self-pay | Admitting: *Deleted

## 2017-02-15 DIAGNOSIS — Z006 Encounter for examination for normal comparison and control in clinical research program: Secondary | ICD-10-CM

## 2017-02-15 NOTE — Progress Notes (Signed)
[place]-10 Study  All elements of the informed consent form, study requirements and expectations were reviewed with the patient, and all questions and concerns were identified and addressed prior to the signing of the consent. No procedures were performed prior to consenting the patient. The patient was given an adequate amount of time to make an informed decision. A copy of the informed consent was provided to the patient to take home. 02/15/17   10:15am

## 2017-02-22 ENCOUNTER — Encounter: Payer: Self-pay | Admitting: Cardiology

## 2017-02-22 ENCOUNTER — Encounter: Payer: Self-pay | Admitting: *Deleted

## 2017-02-22 ENCOUNTER — Other Ambulatory Visit: Payer: Self-pay | Admitting: *Deleted

## 2017-02-22 DIAGNOSIS — Z006 Encounter for examination for normal comparison and control in clinical research program: Secondary | ICD-10-CM

## 2017-02-22 MED ORDER — AMBULATORY NON FORMULARY MEDICATION: 300.0000 mg | Status: DC

## 2017-02-22 NOTE — Progress Notes (Signed)
[facility]-10 Study  Patient was randomized into the [place]-10 Study on 02/22/17. Patient received Inclisiran sodium 300 mg subq vs placebo injection. Patient tolerated procedure well. I will see patient for next visit in 30 days.

## 2017-03-20 ENCOUNTER — Ambulatory Visit
Admission: RE | Admit: 2017-03-20 | Discharge: 2017-03-20 | Disposition: A | Discharge status: Home / Self Care | Payer: Medicare Other | Source: Ambulatory Visit | Attending: Family Medicine | Admitting: Family Medicine

## 2017-03-20 DIAGNOSIS — Z1231 Encounter for screening mammogram for malignant neoplasm of breast: Secondary | ICD-10-CM

## 2017-03-21 DIAGNOSIS — E1151 Type 2 diabetes mellitus with diabetic peripheral angiopathy without gangrene: Secondary | ICD-10-CM | POA: Diagnosis not present

## 2017-03-21 DIAGNOSIS — E1140 Type 2 diabetes mellitus with diabetic neuropathy, unspecified: Secondary | ICD-10-CM | POA: Diagnosis not present

## 2017-03-22 ENCOUNTER — Encounter: Payer: Self-pay | Admitting: *Deleted

## 2017-03-22 DIAGNOSIS — Z006 Encounter for examination for normal comparison and control in clinical research program: Secondary | ICD-10-CM

## 2017-03-22 NOTE — Progress Notes (Signed)
[facility]-10 study  I saw patient for 30-day visit. Patient is doing well. I will see patient in 60 days for next [place]-10 Study visit.

## 2017-05-02 ENCOUNTER — Other Ambulatory Visit: Payer: Self-pay | Admitting: Cardiology

## 2017-05-02 NOTE — Telephone Encounter (Signed)
Rx(s) sent to pharmacy electronically.  

## 2017-05-04 DIAGNOSIS — Z961 Presence of intraocular lens: Secondary | ICD-10-CM | POA: Diagnosis not present

## 2017-05-04 DIAGNOSIS — E119 Type 2 diabetes mellitus without complications: Secondary | ICD-10-CM | POA: Diagnosis not present

## 2017-05-04 DIAGNOSIS — H35341 Macular cyst, hole, or pseudohole, right eye: Secondary | ICD-10-CM | POA: Diagnosis not present

## 2017-05-05 DIAGNOSIS — N342 Other urethritis: Secondary | ICD-10-CM | POA: Diagnosis not present

## 2017-05-05 DIAGNOSIS — E6609 Other obesity due to excess calories: Secondary | ICD-10-CM | POA: Diagnosis not present

## 2017-05-05 DIAGNOSIS — Z6833 Body mass index (BMI) 33.0-33.9, adult: Secondary | ICD-10-CM | POA: Diagnosis not present

## 2017-05-05 DIAGNOSIS — R350 Frequency of micturition: Secondary | ICD-10-CM | POA: Diagnosis not present

## 2017-05-05 DIAGNOSIS — F419 Anxiety disorder, unspecified: Secondary | ICD-10-CM | POA: Diagnosis not present

## 2017-05-12 DIAGNOSIS — N3000 Acute cystitis without hematuria: Secondary | ICD-10-CM | POA: Diagnosis not present

## 2017-05-12 DIAGNOSIS — Z6833 Body mass index (BMI) 33.0-33.9, adult: Secondary | ICD-10-CM | POA: Diagnosis not present

## 2017-05-12 DIAGNOSIS — J329 Chronic sinusitis, unspecified: Secondary | ICD-10-CM | POA: Diagnosis not present

## 2017-05-12 DIAGNOSIS — N390 Urinary tract infection, site not specified: Secondary | ICD-10-CM | POA: Diagnosis not present

## 2017-05-16 DIAGNOSIS — R350 Frequency of micturition: Secondary | ICD-10-CM | POA: Diagnosis not present

## 2017-05-16 DIAGNOSIS — E119 Type 2 diabetes mellitus without complications: Secondary | ICD-10-CM | POA: Diagnosis not present

## 2017-05-16 DIAGNOSIS — Z6833 Body mass index (BMI) 33.0-33.9, adult: Secondary | ICD-10-CM | POA: Diagnosis not present

## 2017-05-16 DIAGNOSIS — A491 Streptococcal infection, unspecified site: Secondary | ICD-10-CM | POA: Diagnosis not present

## 2017-05-17 DIAGNOSIS — Z1211 Encounter for screening for malignant neoplasm of colon: Secondary | ICD-10-CM | POA: Diagnosis not present

## 2017-05-19 ENCOUNTER — Encounter: Payer: Self-pay | Admitting: Gastroenterology

## 2017-05-30 DIAGNOSIS — J301 Allergic rhinitis due to pollen: Secondary | ICD-10-CM | POA: Diagnosis not present

## 2017-05-30 DIAGNOSIS — K210 Gastro-esophageal reflux disease with esophagitis: Secondary | ICD-10-CM | POA: Diagnosis not present

## 2017-05-30 DIAGNOSIS — J449 Chronic obstructive pulmonary disease, unspecified: Secondary | ICD-10-CM | POA: Diagnosis not present

## 2017-05-30 DIAGNOSIS — I10 Essential (primary) hypertension: Secondary | ICD-10-CM | POA: Diagnosis not present

## 2017-06-01 ENCOUNTER — Telehealth: Payer: Self-pay

## 2017-06-01 NOTE — Telephone Encounter (Signed)
PT's daughter, Humberto Leep had left her phone number [phone]) to be called to schedule colonoscopy for her mom.   I called and LMOM for a return call.

## 2017-06-01 NOTE — Telephone Encounter (Signed)
Also, left Vm at pt's home for a return call.

## 2017-06-05 NOTE — Telephone Encounter (Signed)
Notation: Incorrect phone number and info for pt's daughter. Her daughter is Nevin Bloodgood @ [phone]. I called pt and they were together today. But pt is a pt of Dr. Laural Golden and I told her she needs to have Dr. Gerarda Fraction send referral to him. She said she would do so.

## 2017-06-06 ENCOUNTER — Encounter (INDEPENDENT_AMBULATORY_CARE_PROVIDER_SITE_OTHER): Payer: Self-pay | Admitting: *Deleted

## 2017-06-06 DIAGNOSIS — E1140 Type 2 diabetes mellitus with diabetic neuropathy, unspecified: Secondary | ICD-10-CM | POA: Diagnosis not present

## 2017-06-06 DIAGNOSIS — E1151 Type 2 diabetes mellitus with diabetic peripheral angiopathy without gangrene: Secondary | ICD-10-CM | POA: Diagnosis not present

## 2017-07-05 DIAGNOSIS — E782 Mixed hyperlipidemia: Secondary | ICD-10-CM | POA: Diagnosis not present

## 2017-07-05 DIAGNOSIS — M1991 Primary osteoarthritis, unspecified site: Secondary | ICD-10-CM | POA: Diagnosis not present

## 2017-07-05 DIAGNOSIS — Z6835 Body mass index (BMI) 35.0-35.9, adult: Secondary | ICD-10-CM | POA: Diagnosis not present

## 2017-07-05 DIAGNOSIS — Z0001 Encounter for general adult medical examination with abnormal findings: Secondary | ICD-10-CM | POA: Diagnosis not present

## 2017-07-11 ENCOUNTER — Telehealth: Payer: Self-pay | Admitting: Cardiology

## 2017-07-11 DIAGNOSIS — I10 Essential (primary) hypertension: Secondary | ICD-10-CM | POA: Diagnosis not present

## 2017-07-11 DIAGNOSIS — R079 Chest pain, unspecified: Secondary | ICD-10-CM | POA: Diagnosis not present

## 2017-07-11 DIAGNOSIS — J449 Chronic obstructive pulmonary disease, unspecified: Secondary | ICD-10-CM | POA: Diagnosis not present

## 2017-07-11 DIAGNOSIS — G4733 Obstructive sleep apnea (adult) (pediatric): Secondary | ICD-10-CM | POA: Diagnosis not present

## 2017-07-11 NOTE — Telephone Encounter (Signed)
Spoke with pt she was just calling to let dr harding know and schedule her follow up appt. Appt scheduled for 8-3 pt informed to take her BP BID and let us know if it is BP/HR is abnormal or if she gets chest pain again

## 2017-07-11 NOTE — Telephone Encounter (Signed)
Let's try to reassure her that I don't think this chest pain sounds very cardiac in nature.  As probably fine for her to come back in a couple weeks, we don't need to rush.

## 2017-07-11 NOTE — Telephone Encounter (Signed)
Pt c/o of Chest Pain: STAT if CP now or developed within 24 hours  1. Are you having CP right now?  No but she is sore in that area   2. Are you experiencing any other symptoms (ex. SOB, nausea, vomiting, sweating)? SOB, dizzy   3. How long have you been experiencing CP? This happened Saturday night   4. Is your CP continuous or coming and going? Constant   5. Have you taken Nitroglycerin? She took 3 pills  ?

## 2017-07-11 NOTE — Telephone Encounter (Signed)
Pt notified,Pt will call back if this recurs or BP/HR is abn.

## 2017-07-25 ENCOUNTER — Encounter: Payer: Self-pay | Admitting: *Deleted

## 2017-07-25 DIAGNOSIS — Z006 Encounter for examination for normal comparison and control in clinical research program: Secondary | ICD-10-CM

## 2017-07-25 NOTE — Progress Notes (Signed)
I saw patient for 150 day study visit. Labs done per protocol. Patient is doing well today but had a recent bout of chest pain unrelieved by 3 nitroglycerin. She is not having any chest pain today and vital signs are stable. She will see Dr. Ellyn Hack in follow-up this week. I will see patient for next [place] study visit on November 07, 2017 at 9:00am

## 2017-07-27 ENCOUNTER — Telehealth: Payer: Self-pay | Admitting: Cardiology

## 2017-07-27 NOTE — Telephone Encounter (Signed)
Anderson Malta from [facility] calling. She notes pt was recently c/o chest pain and had labs drawn on Tuesday. They just got report back on those labs this AM which shows elevated CK of 384. Anderson Malta is faxing copy of these to our clinical fax #.  Asking if she should be seen today? She is already on schedule for tomorrow.  Anderson Malta may be reached at [phone]  If questions.

## 2017-07-27 NOTE — Telephone Encounter (Signed)
Discussed w/ Dr. Ellyn Hack, who reviewed notes - OK for patient to follow up as scheduled tomorrow.  I have communicated advisement to [facility] [place] who verbalized understanding and thanks.

## 2017-07-28 ENCOUNTER — Encounter: Payer: Self-pay | Admitting: Cardiology

## 2017-07-28 ENCOUNTER — Ambulatory Visit (INDEPENDENT_AMBULATORY_CARE_PROVIDER_SITE_OTHER): Payer: Medicare Other | Admitting: Cardiology

## 2017-07-28 VITALS — BP 139/77 | HR 84 | Ht 59.0 in | Wt 178.2 lb

## 2017-07-28 DIAGNOSIS — R079 Chest pain, unspecified: Secondary | ICD-10-CM

## 2017-07-28 DIAGNOSIS — R0609 Other forms of dyspnea: Secondary | ICD-10-CM | POA: Diagnosis not present

## 2017-07-28 DIAGNOSIS — I10 Essential (primary) hypertension: Secondary | ICD-10-CM

## 2017-07-28 DIAGNOSIS — I2510 Atherosclerotic heart disease of native coronary artery without angina pectoris: Secondary | ICD-10-CM | POA: Diagnosis not present

## 2017-07-28 DIAGNOSIS — R600 Localized edema: Secondary | ICD-10-CM

## 2017-07-28 DIAGNOSIS — Z01818 Encounter for other preprocedural examination: Secondary | ICD-10-CM | POA: Diagnosis not present

## 2017-07-28 DIAGNOSIS — R0600 Dyspnea, unspecified: Secondary | ICD-10-CM

## 2017-07-28 DIAGNOSIS — E785 Hyperlipidemia, unspecified: Secondary | ICD-10-CM

## 2017-07-28 DIAGNOSIS — R748 Abnormal levels of other serum enzymes: Secondary | ICD-10-CM

## 2017-07-28 LAB — BASIC METABOLIC PANEL
BUN/Creatinine Ratio: 17 (ref 12–28)
BUN: 10 mg/dL (ref 8–27)
CALCIUM: 9.9 mg/dL (ref 8.7–10.3)
CHLORIDE: 103 mmol/L (ref 96–106)
CO2: 26 mmol/L (ref 20–29)
CREATININE: 0.6 mg/dL (ref 0.57–1.00)
GFR calc non Af Amer: 93 mL/min/{1.73_m2} (ref >59)
GFR, EST AFRICAN AMERICAN: 107 mL/min/{1.73_m2} (ref >59)
Glucose: 97 mg/dL (ref 65–99)
Potassium: 4.6 mmol/L (ref 3.5–5.2)
Sodium: 144 mmol/L (ref 134–144)

## 2017-07-28 LAB — CK: CK TOTAL: 107 U/L (ref 24–173)

## 2017-07-28 NOTE — Patient Instructions (Addendum)
MEDICATION CHANGES  TAKE ENALAPRIL 2.5 MG IN THE MORNINGS TAKE FELODIPINE 2.5 MG AT BEDTIME TAKE PRAVASTATIN AT DINNER TIME   TAKE LASIX ( FUROSEMIDE) AT LEAST 4 DAYS A WEEK    SCHEDULE AT [place] [facility] Your physician has requested that you have an echocardiogram. Echocardiography is a painless test that uses sound waves to create images of your heart. It provides your doctor with information about the size and shape of your heart and how well your heart's chambers and valves are working. This procedure takes approximately one hour. There are no restrictions for this procedure.   SCHEDULE AT [place] Your physician has requested that you have cardiac - CORONARY CTA. Cardiac computed tomography (CT) is a painless test that uses an x-ray machine to take clear, detailed pictures of your heart. For further information please visit [email or web address]. Please follow instruction sheet as given. NEEDS TO HAVE LAB-BMP DONE  LAB TODAY - CK,BMP   '    Your physician wants you to follow-up in [place]. You will receive a reminder letter in the mail two months in advance. If you don't receive a letter, please call our office to schedule the follow-up appointment.   If you need a refill on your cardiac medications before your next appointment, please call your pharmacy.

## 2017-07-28 NOTE — Progress Notes (Signed)
PCP: Sharilyn Sites, MD  Clinic Note: Chief Complaint  Patient presents with  . Follow-up    6 months;  . Chest Pain    HPI: Vickie [name] is a 71 y.o. female with a PMH below who presents today for Six-month follow-up for her chronic intermittent chest pain and nonobstructive CAD. Is a former patient of Dr. Rollene Fare - initially had an episode in 2007 cardiac catheterization but to a catheter-induced dissection of a small nondominant RCA. Since then has been stable, angiographic and stress test result.  She has had lots of social stress with children and grandchildren causing trouble. She is in the care of her granddaughter who is pregnant, and her mother is not truly in the picture.  - She is here actually for having some episodes of chest pain not relieved by nitroglycerin.  Vickie [name] was last seen February 2018 She has been followed by the [facility] trial.  Recent Hospitalizations: none  Studies Personally Reviewed - (if available, images/films reviewed: From Epic Chart or Care Everywhere)  none  Interval History: Vickie [name] returns today sooner than originally scheduled because she's been having more episodes of chest pain. She has had several episodes of chest discomfort throughout the past few years, but has not had an ischemic evaluation in a while. She states that 3 days ago she was sitting reading the Bible and noticed a squeezing heavy pressure in her chest that went up into her jaw and down her left arm. It lasted well over half an hour. She took 3 nitroglycerin and it did not improve. She finally is Xanax and got better and she went to sleep. Since then she been having some symptoms of chest discomfort, but not like she had that night. She has not really noticed any exertional dyspnea or chest pain like that - but has had some of her normal sharp chest discomfort as the, go with and without exertion.. She says that since this happened she's been feeling very tired and  fatigued. She been having more exertional dyspnea. She denies any PND or orthopnea, but has had some mild and of date edema.   She does note occasional flip-flop or skipping her chest, but no prolonged rapid irregular heartbeats or palpitations.  She has had some positional dizziness, but no syncope/near syncope. No TIA/amaurosis fugax symptoms. No claudication.  ROS: A comprehensive was performed. Review of Systems  Constitutional: Positive for malaise/fatigue.  HENT: Negative for congestion.   Respiratory: Negative for cough and wheezing.   Cardiovascular:       Per history of present illness  Gastrointestinal: Negative for blood in stool and melena.  Genitourinary: Negative for frequency, hematuria and urgency.  Musculoskeletal: Negative for falls and joint pain.  Skin: Negative.   Endo/Heme/Allergies: Negative for environmental allergies.  Psychiatric/Behavioral: Negative for depression and memory loss. The patient is nervous/anxious and has insomnia.        Still has lots of social stress  All other systems reviewed and are negative.   I have reviewed and (if needed) personally updated the patient's problem list, medications, allergies, past medical and surgical history, social and family history.   Past Medical History:  Diagnosis Date  . Anxiety disorder    With apparent panic attacks  . Arthritis   . COPD (chronic obstructive pulmonary disease) ([place])    PFTs were done in 2008 at [facility]  . Diabetes mellitus type II, non insulin dependent ([place])   . Essential hypertension   .  Hiatal hernia   . Hyperlipidemia   . Nonocclusive coronary atherosclerosis of native coronary artery 2006 through 2012   multi caths 2012, 2006, [phone] (4259 complicated by catheter-induced dissection of small nondominant RCA, patent in 2009 with no residual abnormality); Myoview August 2013: LOW RISK, normal EF. ; Echocardiogram [name] - January 2014) moderate LVH, EF 55-65%. No  significant valvular disease.  . OSA (obstructive sleep apnea) 9/25/ 2012   tested 2009; tetested sleep study 07/2011--titration 09/20/2011 now use Bi-PAP    Past Surgical History:  Procedure Laterality Date  . ABDOMINAL HYSTERECTOMY    . CARDIAC CATHETERIZATION  [phone]   Non-occlusice CAD - only 80% ostial SP1;  NON DOMINANNT  RCA (catheter insuced dissection with MI in 2007 --> resolved by 2009 cath)  . DOPPLER ECHOCARDIOGRAPHY  01/09/2013   at [facility]---showed moderate LVH, 55% to 65%  with no significant valve disease  . LIPOMA EXCISION     rt anterior abdomen  . NM MYOVIEW LTD  07/2012; July 2016   a) (Most recent of many) LOW RISK ; normal EF and normal perfusion , low risk;; b) EF 55-65%, LOW RISK - no ischemia or infarction  . PULMONARY FUNCTION TEST  11/19/2007   at [facility],    Current Meds  Medication Sig  . albuterol (PROAIR HFA) 108 (90 BASE) MCG/ACT inhaler Inhale 1 puff into the lungs every 6 (six) hours as needed for wheezing or shortness of breath.  Marland Kitchen albuterol (PROVENTIL) (2.5 MG/3ML) 0.083% nebulizer solution Take 2.5 mg by nebulization as needed for wheezing or shortness of breath.  . ALPRAZolam (XANAX) 0.5 MG tablet Take 0.25 mg by mouth 2 (two) times daily.   . AMBULATORY NON FORMULARY MEDICATION Inject 300 mg into the skin as directed. Medication Name:Inclisiran sodium 300 mg vs placebo  . busPIRone (BUSPAR) 15 MG tablet Take 15 mg by mouth 2 (two) times daily.  . Calcium Carb-Cholecalciferol (CALCIUM 600 + D PO) Take 1 tablet by mouth 2 (two) times daily.  . Coenzyme Q10 (COQ10) 100 MG CAPS Take 100 mg by mouth every morning.  Marland Kitchen CRANBERRY PO Take 1 capsule by mouth 2 (two) times daily. 3600mg   . enalapril (VASOTEC) 2.5 MG tablet Take 2.5 mg by mouth every evening.   . escitalopram (LEXAPRO) 20 MG tablet Take 20 mg by mouth every morning.   . felodipine (PLENDIL) 2.5 MG 24 hr tablet TAKE ONE TABLET BY MOUTH ONCE DAILY.  Marland Kitchen FIBER COMPLETE PO Take 1  tablet by mouth 2 (two) times daily.  Marland Kitchen labetalol (NORMODYNE) 200 MG tablet Take one tablet as needed for a blood pressure >180 on the top number. (Patient taking differently: Take 200 mg by mouth daily as needed. Take one tablet as needed for a blood pressure >180 on the top number.)  . meloxicam (MOBIC) 7.5 MG tablet Take 7.5 mg by mouth 2 (two) times daily.  . mirtazapine (REMERON) 15 MG tablet Take 15 mg by mouth at bedtime.  . Multiple Vitamin (MULTIVITAMIN) tablet Take 1 tablet by mouth daily.  . nitroGLYCERIN (NITROSTAT) 0.4 MG SL tablet Place 1 tablet (0.4 mg total) under the tongue every 5 (five) minutes as needed for chest pain. Max 3 doses.  . Omega-3 Fatty Acids (FISH OIL) 1200 MG CAPS Take 1 capsule by mouth at bedtime.  . pantoprazole (PROTONIX) 40 MG tablet Take 1 tablet (40 mg total) by mouth daily.  . pravastatin (PRAVACHOL) 40 MG tablet Take 1 tablet (40 mg total) by  mouth daily.  . Tiotropium Bromide Monohydrate (SPIRIVA RESPIMAT) 2.5 MCG/ACT AERS Inhale 2.5 mcg into the lungs 2 (two) times daily.    Allergies  Allergen Reactions  . Avelox [Moxifloxacin] Anaphylaxis  . Bactrim [Sulfamethoxazole-Trimethoprim] Shortness Of Breath and Rash    REACTION: Choking, inability to swallow, redness  . Levaquin [Levofloxacin] Shortness Of Breath, Rash and Other (See Comments)    Reaction:Choking Brand name Levaquin ok per pt  . Mucinex [Guaifenesin Er] Shortness Of Breath and Rash  . Sudafed [Pseudoephedrine Hcl] Shortness Of Breath, Rash and Other (See Comments)    REACTION: Choking, redness, inability to swallow  . Adhesive [Tape] Other (See Comments)    REACTION: redness/irritation at application site. **Certain bandages/adhesives cause this reaction**  . Crestor [Rosuvastatin] Other (See Comments)    LEG ACHING  . Lipitor [Atorvastatin]   . Norvasc [Amlodipine]   . Penicillins Other (See Comments)    REACTION: Faint ("passes out")  . Vytorin [Ezetimibe-Simvastatin]      Social History   Social History  . Marital status: Married    Spouse name: N/A  . Number of children: N/A  . Years of education: N/A   Social History Main Topics  . Smoking status: Former Smoker    Quit date: 04/18/1977  . Smokeless tobacco: Never Used  . Alcohol use No  . Drug use: No  . Sexual activity: Not Asked   Other Topics Concern  . None   Social History Narrative   Married mother of 4, grandmother of 69. Her mother is 39 years old. Quit smoking 34 years ago. Does not drink alcohol.  Retired from [employer] in 2012.   Usually presents with oldest daughter.   Previously worked out at [employer] regularly walking 1/2-1 mile a day, but no longer able to do so because of the [employer] decision to no longer cover [employer] cost.    family history includes Breast cancer in her maternal aunt and maternal aunt; CAD in her mother.  Wt Readings from Last 3 Encounters:  07/28/17 178 lb 3.2 oz (80.8 kg)  10/05/16 182 lb 9.6 oz (82.8 kg)  10/03/16 182 lb (82.6 kg)    PHYSICAL EXAM BP 139/77   Pulse 84   Ht 4\' 11"  (1.499 m)   Wt 178 lb 3.2 oz (80.8 kg)   BMI 35.99 kg/m  -- She tells me at home her blood pressures range anywhere from the 130s -150s /80s. Physical Exam  Constitutional: She appears well-developed and well-nourished. No distress.  Moderately obese. Seems a little less anxious today.  Cardiovascular: Normal rate, regular rhythm and intact distal pulses.   Extrasystoles are present. PMI is not displaced.  Exam reveals no gallop, no friction rub and no decreased pulses.   Murmur heard.  Medium-pitched harsh crescendo-decrescendo early systolic murmur is present with a grade of 1/6  at the upper right sternal border radiating to the neck Pulmonary/Chest: Effort normal and breath sounds normal. No respiratory distress. She has no wheezes. She has no rales. She exhibits tenderness.  Abdominal: Soft. Bowel sounds are normal. She  exhibits no distension. There is no tenderness. There is no rebound.  Musculoskeletal: Normal range of motion. 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 She exhibits no edema (Trivial puffiness, no real swelling).  Neurological: She is alert.  Skin: Skin is warm and dry.  Psychiatric: She has a normal mood and affect. Her behavior is normal. Thought content normal.  Nursing note and vitals reviewed.    Adult ECG Report None  Other studies Reviewed: Additional studies/ records that were reviewed today include:  Recent Labs:   Lab Results  Component Value Date   CHOL 217 (H) 02/10/2017   HDL 53 02/10/2017   LDLCALC 121 (H) 02/10/2017   TRIG 214 (H) 02/10/2017   CHOLHDL 4.1 02/10/2017    ASSESSMENT / PLAN: Problem List Items Addressed This Visit    Bilateral lower extremity edema (Chronic)    She has not been taking her Lasix every day, and is now noticing some more edema. I also think that some of her nighttime symptoms could be related to elevated filling pressures. Plan: I would ask her to take her Lasix at least 4 times a week. We'll check 2-D echocardiogram to ensure stable cardiac function      Chest pain with moderate risk for cardiac etiology    Another prolonged episode of chest discomfort. I am very reluctant to consider cardiac catheterization on her, but I think not unreasonable time to do an ischemic evaluation just to clarify her symptoms. Plan: Coronary CTA.  I will also have her switch her felodipine to evening dose and if symptoms recur, will increase Klonopin to 5 mg daily. She will take her enalapril in the morning.  She has been taking pravastatin at night, she'll take at dinner time in order to allow flipping to be taken at night.      Relevant Orders   ECHOCARDIOGRAM COMPLETE   CT CORONARY MORPH W/CTA COR W/SCORE W/CA W/CM &/OR WO/CM   CT CORONARY FRACTIONAL FLOW RESERVE DATA PREP   CT CORONARY FRACTIONAL  FLOW RESERVE FLUID ANALYSIS   Basic metabolic panel (Completed)   CK (Completed)   DOE (dyspnea on exertion)    She has exertional dyspnea that sounds more like to deconditioning, but in the setting of having chest discomfort we will check 2-D echocardiogram since there is also been some edema and nocturnal chest discomfort.  Plan: Check 2-D echo      Relevant Orders   ECHOCARDIOGRAM COMPLETE   CT CORONARY MORPH W/CTA COR W/SCORE W/CA W/CM &/OR WO/CM   CT CORONARY FRACTIONAL FLOW RESERVE DATA PREP   CT CORONARY FRACTIONAL FLOW RESERVE FLUID ANALYSIS   Basic metabolic panel (Completed)   CK (Completed)   Elevated CK    As part of her screening labs for the [place] study, her CK level was elevated and they have asked for [place] to recheck a level today. - This can be done while she does her preprocedural labs for her CT scan.      Relevant Orders   CK (Completed)   Essential hypertension - Primary (Chronic)    Stable at present, plan would be to increase felodipine to 5 mg daily based on recurrent symptoms. It sounds like this may be corner spasm related.       Hyperlipidemia LDL goal <70 (Chronic)    On protocol. Also part of the [place] 10 study      Nonocclusive coronary atherosclerosis of native coronary artery (Chronic)    The only abnormal finding on cardiac cath in the past was catheter induced dissection of a nondominant right coronary artery. Since then she has had multiple negative evaluations.  Just to confirm there's been no progression of disease we will check a CTA.   She has frequent episodes of chest pain, and given her history and risk factors, we do need to exclude a true anginal episode  given the relatively classic description that she gave.  Effective this episode occurred at rest makes it somewhat concerning for possible spasm. As such, I have her take her amlodipine) nighttime since her symptoms occur then. We will also contact her to have her increase felodipine to 5  mg daily.       Other Visit Diagnoses    Pre-op testing       Relevant Orders   Basic metabolic panel (Completed)   CK (Completed)      Current medicines are reviewed at length with the patient today. (+/- concerns) n/a The following changes have been made: n/a  Patient Instructions  MEDICATION CHANGES  TAKE ENALAPRIL 2.5 MG IN THE MORNINGS TAKE FELODIPINE 2.5 MG AT BEDTIME TAKE PRAVASTATIN AT DINNER TIME   TAKE LASIX ( FUROSEMIDE) AT LEAST 4 DAYS A WEEK    SCHEDULE AT [place] [address] Your physician has requested that you have an echocardiogram. Echocardiography is a painless test that uses sound waves to create images of your heart. It provides your doctor with information about the size and shape of your heart and how well your heart's chambers and valves are working. This procedure takes approximately one hour. There are no restrictions for this procedure.   SCHEDULE AT [place] Your physician has requested that you have cardiac - CORONARY CTA. Cardiac computed tomography (CT) is a painless test that uses an x-ray machine to take clear, detailed pictures of your heart. For further information please visit [facility]. Please follow instruction sheet as given. NEEDS TO HAVE LAB-BMP DONE  LAB TODAY - CK,BMP   '    Your physician wants you to follow-up in [place]. You will receive a reminder letter in the mail two months in advance. If you don't receive a letter, please call our office to schedule the follow-up appointment.   If you need a refill on your cardiac medications before your next appointment, please call your pharmacy.      Studies Ordered:   Orders Placed This Encounter  Procedures  . CT CORONARY MORPH W/CTA COR W/SCORE W/CA W/CM &/OR WO/CM  . CT CORONARY FRACTIONAL FLOW RESERVE DATA PREP  . CT CORONARY FRACTIONAL FLOW RESERVE FLUID ANALYSIS  . Basic metabolic panel  .  CK  . ECHOCARDIOGRAM COMPLETE      Glenetta Hew, M.D., M.S. Interventional Cardiologist   Pager # [phone] Phone # [phone] [address] [place], [place] [address]

## 2017-07-30 ENCOUNTER — Encounter: Payer: Self-pay | Admitting: Cardiology

## 2017-07-30 DIAGNOSIS — R748 Abnormal levels of other serum enzymes: Secondary | ICD-10-CM | POA: Insufficient documentation

## 2017-07-30 NOTE — Assessment & Plan Note (Signed)
She has exertional dyspnea that sounds more like to deconditioning, but in the setting of having chest discomfort we will check 2-D echocardiogram since there is also been some edema and nocturnal chest discomfort.  Plan: Check 2-D echo

## 2017-07-30 NOTE — Assessment & Plan Note (Addendum)
She has not been taking her Lasix every day, and is now noticing some more edema. I also think that some of her nighttime symptoms could be related to elevated filling pressures. Plan: I would ask her to take her Lasix at least 4 times a week. We'll check 2-D echocardiogram to ensure stable cardiac function

## 2017-07-30 NOTE — Assessment & Plan Note (Signed)
As part of her screening labs for the [place] study, her CK level was elevated and they have asked for [place] to recheck a level today. - This can be done while she does her preprocedural labs for her CT scan.

## 2017-07-30 NOTE — Assessment & Plan Note (Addendum)
The only abnormal finding on cardiac cath in the past was catheter induced dissection of a nondominant right coronary artery. Since then she has had multiple negative evaluations.  Just to confirm there's been no progression of disease we will check a CTA.   She has frequent episodes of chest pain, and given her history and risk factors, we do need to exclude a true anginal episode given the relatively classic description that she gave.  Effective this episode occurred at rest makes it somewhat concerning for possible spasm. As such, I have her take her amlodipine) nighttime since her symptoms occur then. We will also contact her to have her increase felodipine to 5 mg daily.

## 2017-07-30 NOTE — Assessment & Plan Note (Signed)
On protocol. Also part of the [place] 10 study

## 2017-07-30 NOTE — Assessment & Plan Note (Signed)
Stable at present, plan would be to increase felodipine to 5 mg daily based on recurrent symptoms. It sounds like this may be corner spasm related.

## 2017-07-30 NOTE — Assessment & Plan Note (Signed)
Another prolonged episode of chest discomfort. I am very reluctant to consider cardiac catheterization on her, but I think not unreasonable time to do an ischemic evaluation just to clarify her symptoms. Plan: Coronary CTA.  I will also have her switch her felodipine to evening dose and if symptoms recur, will increase Klonopin to 5 mg daily. She will take her enalapril in the morning.  She has been taking pravastatin at night, she'll take at dinner time in order to allow flipping to be taken at night.

## 2017-07-31 ENCOUNTER — Telehealth (INDEPENDENT_AMBULATORY_CARE_PROVIDER_SITE_OTHER): Payer: Self-pay | Admitting: *Deleted

## 2017-07-31 ENCOUNTER — Encounter (INDEPENDENT_AMBULATORY_CARE_PROVIDER_SITE_OTHER): Payer: Self-pay | Admitting: *Deleted

## 2017-07-31 ENCOUNTER — Other Ambulatory Visit (INDEPENDENT_AMBULATORY_CARE_PROVIDER_SITE_OTHER): Payer: Self-pay | Admitting: *Deleted

## 2017-07-31 DIAGNOSIS — Z1211 Encounter for screening for malignant neoplasm of colon: Secondary | ICD-10-CM | POA: Insufficient documentation

## 2017-07-31 MED ORDER — PEG 3350-KCL-NA BICARB-NACL 420 G PO SOLR: 4000.0000 mL | Freq: Once | ORAL | 0 refills | Status: AC

## 2017-07-31 NOTE — Telephone Encounter (Signed)
Patient needs trilyte 

## 2017-08-02 ENCOUNTER — Ambulatory Visit (HOSPITAL_COMMUNITY): Payer: Medicare Other | Attending: Cardiology

## 2017-08-02 ENCOUNTER — Other Ambulatory Visit: Payer: Self-pay

## 2017-08-02 DIAGNOSIS — I351 Nonrheumatic aortic (valve) insufficiency: Secondary | ICD-10-CM | POA: Insufficient documentation

## 2017-08-02 DIAGNOSIS — R079 Chest pain, unspecified: Secondary | ICD-10-CM | POA: Insufficient documentation

## 2017-08-02 DIAGNOSIS — R0609 Other forms of dyspnea: Secondary | ICD-10-CM

## 2017-08-02 DIAGNOSIS — Z6836 Body mass index (BMI) 36.0-36.9, adult: Secondary | ICD-10-CM | POA: Diagnosis not present

## 2017-08-02 DIAGNOSIS — R0600 Dyspnea, unspecified: Secondary | ICD-10-CM | POA: Diagnosis present

## 2017-08-02 DIAGNOSIS — E119 Type 2 diabetes mellitus without complications: Secondary | ICD-10-CM | POA: Diagnosis not present

## 2017-08-02 DIAGNOSIS — Z87891 Personal history of nicotine dependence: Secondary | ICD-10-CM | POA: Diagnosis not present

## 2017-08-02 DIAGNOSIS — R600 Localized edema: Secondary | ICD-10-CM | POA: Insufficient documentation

## 2017-08-02 DIAGNOSIS — I2510 Atherosclerotic heart disease of native coronary artery without angina pectoris: Secondary | ICD-10-CM | POA: Diagnosis not present

## 2017-08-02 DIAGNOSIS — E669 Obesity, unspecified: Secondary | ICD-10-CM | POA: Diagnosis not present

## 2017-08-03 MED ORDER — PERFLUTREN LIPID MICROSPHERE
1.0000 mL | INTRAVENOUS | Status: AC | PRN
  Administered 2017-08-02: 2 mL via INTRAVENOUS

## 2017-08-08 ENCOUNTER — Ambulatory Visit (INDEPENDENT_AMBULATORY_CARE_PROVIDER_SITE_OTHER): Payer: Medicare Other | Admitting: Urology

## 2017-08-08 DIAGNOSIS — R350 Frequency of micturition: Secondary | ICD-10-CM | POA: Diagnosis not present

## 2017-08-08 DIAGNOSIS — R351 Nocturia: Secondary | ICD-10-CM | POA: Diagnosis not present

## 2017-08-09 ENCOUNTER — Telehealth: Payer: Self-pay | Admitting: Obstetrics and Gynecology

## 2017-08-09 NOTE — Telephone Encounter (Signed)
Given 14 gm sample and instructions , as per request of [facility].

## 2017-08-10 ENCOUNTER — Telehealth: Payer: Self-pay | Admitting: Cardiology

## 2017-08-10 DIAGNOSIS — J449 Chronic obstructive pulmonary disease, unspecified: Secondary | ICD-10-CM | POA: Diagnosis not present

## 2017-08-10 NOTE — Telephone Encounter (Signed)
Message routed to scheduling pool to arrange CT

## 2017-08-10 NOTE — Telephone Encounter (Signed)
New message      Pt is calling to let the nurse know that [employer] approved her 3 test.  She states that she is approved from 08-01-17 thru 08-30-17.  Please call to schedule test.  She did not know the name of the test but thought it was an ultrasound

## 2017-08-11 ENCOUNTER — Telehealth (INDEPENDENT_AMBULATORY_CARE_PROVIDER_SITE_OTHER): Payer: Self-pay | Admitting: *Deleted

## 2017-08-11 NOTE — Telephone Encounter (Signed)
Referring MD/PCP: golding   Procedure: tcs w propofol  Reason/Indication:  screening  Has patient had this procedure before?  Yes, 2008  If so, when, by whom and where?    Is there a family history of colon cancer?  no  Who?  What age when diagnosed?    Is patient diabetic?   boderline      Does patient have prosthetic heart valve or mechanical valve?  no  Do you have a pacemaker?  no  Has patient ever had endocarditis? no  Has patient had joint replacement within last 12 months?  no  Does patient tend to be constipated or take laxatives? Yes, constipated  Does patient have a history of alcohol/drug use?  no  Is patient on Coumadin, Plavix and/or Aspirin? no  Medications: see epic  Allergies: see epic  Medication Adjustment per Dr Laural Golden: meloxicam 2 days  Procedure date & time: 09/08/17 at 1030

## 2017-08-14 NOTE — Telephone Encounter (Signed)
agree

## 2017-08-15 DIAGNOSIS — E1140 Type 2 diabetes mellitus with diabetic neuropathy, unspecified: Secondary | ICD-10-CM | POA: Diagnosis not present

## 2017-08-15 DIAGNOSIS — E1151 Type 2 diabetes mellitus with diabetic peripheral angiopathy without gangrene: Secondary | ICD-10-CM | POA: Diagnosis not present

## 2017-08-22 DIAGNOSIS — I10 Essential (primary) hypertension: Secondary | ICD-10-CM | POA: Diagnosis not present

## 2017-08-22 DIAGNOSIS — G4733 Obstructive sleep apnea (adult) (pediatric): Secondary | ICD-10-CM | POA: Diagnosis not present

## 2017-08-22 DIAGNOSIS — J449 Chronic obstructive pulmonary disease, unspecified: Secondary | ICD-10-CM | POA: Diagnosis not present

## 2017-08-22 DIAGNOSIS — J301 Allergic rhinitis due to pollen: Secondary | ICD-10-CM | POA: Diagnosis not present

## 2017-08-23 ENCOUNTER — Ambulatory Visit (HOSPITAL_COMMUNITY)
Admission: RE | Admit: 2017-08-23 | Discharge: 2017-08-23 | Disposition: A | Discharge status: Home / Self Care | Payer: Medicare Other | Source: Ambulatory Visit | Attending: Cardiology | Admitting: Cardiology

## 2017-08-23 DIAGNOSIS — R079 Chest pain, unspecified: Secondary | ICD-10-CM

## 2017-08-23 DIAGNOSIS — I2510 Atherosclerotic heart disease of native coronary artery without angina pectoris: Secondary | ICD-10-CM | POA: Insufficient documentation

## 2017-08-23 DIAGNOSIS — I700 Atherosclerosis of aorta: Secondary | ICD-10-CM | POA: Insufficient documentation

## 2017-08-23 DIAGNOSIS — R0600 Dyspnea, unspecified: Secondary | ICD-10-CM

## 2017-08-23 DIAGNOSIS — R0609 Other forms of dyspnea: Secondary | ICD-10-CM | POA: Diagnosis not present

## 2017-08-23 MED ORDER — NITROGLYCERIN 0.4 MG SL SUBL: 0.4000 mg | SUBLINGUAL_TABLET | SUBLINGUAL | Status: DC | PRN

## 2017-08-23 MED ORDER — IOPAMIDOL (ISOVUE-370) INJECTION 76%
INTRAVENOUS | Status: AC
  Administered 2017-08-23: 100 mL
  Filled 2017-08-23: qty 100

## 2017-08-23 MED ORDER — METOPROLOL TARTRATE 5 MG/5ML IV SOLN
5.0000 mg | INTRAVENOUS | Status: DC | PRN
  Administered 2017-08-23: 5 mg via INTRAVENOUS
  Administered 2017-08-23: 2.5 mg via INTRAVENOUS

## 2017-08-23 MED ORDER — NITROGLYCERIN 0.4 MG SL SUBL
SUBLINGUAL_TABLET | SUBLINGUAL | Status: AC
  Administered 2017-08-23: 0.8 mg
  Filled 2017-08-23: qty 2

## 2017-08-23 MED ORDER — METOPROLOL TARTRATE 5 MG/5ML IV SOLN
INTRAVENOUS | Status: AC
  Administered 2017-08-23: 2.5 mg via INTRAVENOUS
  Filled 2017-08-23: qty 10

## 2017-08-26 DIAGNOSIS — Z9861 Coronary angioplasty status: Secondary | ICD-10-CM

## 2017-08-26 DIAGNOSIS — I2510 Atherosclerotic heart disease of native coronary artery without angina pectoris: Secondary | ICD-10-CM

## 2017-08-26 DIAGNOSIS — Z8679 Personal history of other diseases of the circulatory system: Secondary | ICD-10-CM

## 2017-08-26 HISTORY — DX: Coronary angioplasty status: Z98.61

## 2017-08-26 HISTORY — DX: Atherosclerotic heart disease of native coronary artery without angina pectoris: I25.10

## 2017-08-26 HISTORY — DX: Personal history of other diseases of the circulatory system: Z86.79

## 2017-08-30 DIAGNOSIS — E6609 Other obesity due to excess calories: Secondary | ICD-10-CM | POA: Diagnosis not present

## 2017-08-30 DIAGNOSIS — Z6835 Body mass index (BMI) 35.0-35.9, adult: Secondary | ICD-10-CM | POA: Diagnosis not present

## 2017-08-30 DIAGNOSIS — F419 Anxiety disorder, unspecified: Secondary | ICD-10-CM | POA: Diagnosis not present

## 2017-08-31 ENCOUNTER — Telehealth: Payer: Self-pay | Admitting: Cardiology

## 2017-08-31 NOTE — Telephone Encounter (Signed)
Returned the call to the patient's daughter (per the dpr) to give her the results of the cardiac ct. She stated that her mother got confused.  Notes recorded by Leonie Man, MD on 08/30/2017 at 1:16 PM EDT So it looks like the CT FFR was read as borderline abnormal in a small side branch. But otherwise was normal.  Recommendation would be to have her return for follow-up and discuss her symptoms. We need to determine whether or not her symptoms warrant catheterization.  She verbalized her understanding and was informed that an appointment was placed for 09/05/17.

## 2017-08-31 NOTE — Patient Instructions (Signed)
Vickie [name]  08/31/2017     @PREFPERIOPPHARMACY @   Your procedure is scheduled on  09/08/2017   Report to [facility] at  900  A.M.  Call this number if you have problems the morning of surgery:  [phone]   Remember:  Do not eat food or drink liquids after midnight.  Take these medicines the morning of surgery with A SIP OF WATER xanax, buspar, emaapril, lexapro, isorbide, labetolol, mobic. Use your nebulizer and your inhalers before you come.   Do not wear jewelry, make-up or nail polish.  Do not wear lotions, powders, or perfumes, or deoderant.  Do not shave 48 hours prior to surgery.  Men may shave face and neck.  Do not bring valuables to the hospital.  [facility] is not responsible for any belongings or valuables.  Contacts, dentures or bridgework may not be worn into surgery.  Leave your suitcase in the car.  After surgery it may be brought to your room.  For patients admitted to the hospital, discharge time will be determined by your treatment team.  Patients discharged the day of surgery will not be allowed to drive home.   Name and phone number of your driver:   family Special instructions:  Follow the diet and prep instructions given to you by Dr Olevia Perches office.  Please read over the following fact sheets that you were given. Anesthesia Post-op Instructions and Care and Recovery After Surgery       Colonoscopy, Adult A colonoscopy is an exam to look at the entire large intestine. During the exam, a lubricated, bendable tube is inserted into the anus and then passed into the rectum, colon, and other parts of the large intestine. A colonoscopy is often done as a part of normal colorectal screening or in response to certain symptoms, such as anemia, persistent diarrhea, abdominal pain, and blood in the stool. The exam can help screen for and diagnose medical problems, including:  Tumors.  Polyps.  Inflammation.  Areas of  bleeding.  Tell a health care provider about:  Any allergies you have.  All medicines you are taking, including vitamins, herbs, eye drops, creams, and over-the-counter medicines.  Any problems you or family members have had with anesthetic medicines.  Any blood disorders you have.  Any surgeries you have had.  Any medical conditions you have.  Any problems you have had passing stool. What are the risks? Generally, this is a safe procedure. However, problems may occur, including:  Bleeding.  A tear in the intestine.  A reaction to medicines given during the exam.  Infection (rare).  What happens before the procedure? Eating and drinking restrictions Follow instructions from your health care provider about eating and drinking, which may include:  A few days before the procedure - follow a low-fiber diet. Avoid nuts, seeds, dried fruit, raw fruits, and vegetables.  1-3 days before the procedure - follow a clear liquid diet. Drink only clear liquids, such as clear broth or bouillon, black coffee or tea, clear juice, clear soft drinks or sports drinks, gelatin dessert, and popsicles. Avoid any liquids that contain red or purple dye.  On the day of the procedure - do not eat or drink anything during the 2 hours before the procedure, or within the time period that your health care provider recommends.  Bowel prep If you were prescribed an oral bowel prep to clean out your colon:  Take it as  told by your health care provider. Starting the day before your procedure, you will need to drink a large amount of medicated liquid. The liquid will cause you to have multiple loose stools until your stool is almost clear or light green.  If your skin or anus gets irritated from diarrhea, you may use these to relieve the irritation: ? Medicated wipes, such as adult wet wipes with aloe and vitamin E. ? A skin soothing-product like petroleum jelly.  If you vomit while drinking the bowel  prep, take a break for up to 60 minutes and then begin the bowel prep again. If vomiting continues and you cannot take the bowel prep without vomiting, call your health care provider.  General instructions  Ask your health care provider about changing or stopping your regular medicines. This is especially important if you are taking diabetes medicines or blood thinners.  Plan to have someone take you home from the hospital or clinic. What happens during the procedure?  An IV tube may be inserted into one of your veins.  You will be given medicine to help you relax (sedative).  To reduce your risk of infection: ? Your health care team will wash or sanitize their hands. ? Your anal area will be washed with soap.  You will be asked to lie on your side with your knees bent.  Your health care provider will lubricate a long, thin, flexible tube. The tube will have a camera and a light on the end.  The tube will be inserted into your anus.  The tube will be gently eased through your rectum and colon.  Air will be delivered into your colon to keep it open. You may feel some pressure or cramping.  The camera will be used to take images during the procedure.  A small tissue sample may be removed from your body to be examined under a microscope (biopsy). If any potential problems are found, the tissue will be sent to a lab for testing.  If small polyps are found, your health care provider may remove them and have them checked for cancer cells.  The tube that was inserted into your anus will be slowly removed. The procedure may vary among health care providers and hospitals. What happens after the procedure?  Your blood pressure, heart rate, breathing rate, and blood oxygen level will be monitored until the medicines you were given have worn off.  Do not drive for 24 hours after the exam.  You may have a small amount of blood in your stool.  You may pass gas and have mild abdominal  cramping or bloating due to the air that was used to inflate your colon during the exam.  It is up to you to get the results of your procedure. Ask your health care provider, or the department performing the procedure, when your results will be ready. This information is not intended to replace advice given to you by your health care provider. Make sure you discuss any questions you have with your health care provider. Document Released: 12/09/2000 Document Revised: 10/12/2016 Document Reviewed: 02/23/2016 Elsevier Interactive Patient Education  2018 [employer].  Colonoscopy, Adult, Care After This sheet gives you information about how to care for yourself after your procedure. Your health care provider may also give you more specific instructions. If you have problems or questions, contact your health care provider. What can I expect after the procedure? After the procedure, it is common to have:  A small amount  of blood in your stool for 24 hours after the procedure.  Some gas.  Mild abdominal cramping or bloating.  Follow these instructions at home: General instructions   For the first 24 hours after the procedure: ? Do not drive or use machinery. ? Do not sign important documents. ? Do not drink alcohol. ? Do your regular daily activities at a slower pace than normal. ? Eat soft, easy-to-digest foods. ? Rest often.  Take over-the-counter or prescription medicines only as told by your health care provider.  It is up to you to get the results of your procedure. Ask your health care provider, or the department performing the procedure, when your results will be ready. Relieving cramping and bloating  Try walking around when you have cramps or feel bloated.  Apply heat to your abdomen as told by your health care provider. Use a heat source that your health care provider recommends, such as a moist heat pack or a heating pad. ? Place a towel between your skin and the heat  source. ? Leave the heat on for 20-30 minutes. ? Remove the heat if your skin turns bright red. This is especially important if you are unable to feel pain, heat, or cold. You may have a greater risk of getting burned. Eating and drinking  Drink enough fluid to keep your urine clear or pale yellow.  Resume your normal diet as instructed by your health care provider. Avoid heavy or fried foods that are hard to digest.  Avoid drinking alcohol for as long as instructed by your health care provider. Contact a health care provider if:  You have blood in your stool 2-3 days after the procedure. Get help right away if:  You have more than a small spotting of blood in your stool.  You pass large blood clots in your stool.  Your abdomen is swollen.  You have nausea or vomiting.  You have a fever.  You have increasing abdominal pain that is not relieved with medicine. This information is not intended to replace advice given to you by your health care provider. Make sure you discuss any questions you have with your health care provider. Document Released: 07/26/2004 Document Revised: 09/05/2016 Document Reviewed: 02/23/2016 Elsevier Interactive Patient Education  2018 [place] Anesthesia is a term that refers to techniques, procedures, and medicines that help a person stay safe and comfortable during a medical procedure. Monitored anesthesia care, or sedation, is one type of anesthesia. Your anesthesia specialist may recommend sedation if you will be having a procedure that does not require you to be unconscious, such as:  Cataract surgery.  A dental procedure.  A biopsy.  A colonoscopy.  During the procedure, you may receive a medicine to help you relax (sedative). There are three levels of sedation:  Mild sedation. At this level, you may feel awake and relaxed. You will be able to follow directions.  Moderate sedation. At this level, you will be  sleepy. You may not remember the procedure.  Deep sedation. At this level, you will be asleep. You will not remember the procedure.  The more medicine you are given, the deeper your level of sedation will be. Depending on how you respond to the procedure, the anesthesia specialist may change your level of sedation or the type of anesthesia to fit your needs. An anesthesia specialist will monitor you closely during the procedure. Let your health care provider know about:  Any allergies you have.  All medicines you are taking, including vitamins, herbs, eye drops, creams, and over-the-counter medicines.  Any use of steroids (by mouth or as a cream).  Any problems you or family members have had with sedatives and anesthetic medicines.  Any blood disorders you have.  Any surgeries you have had.  Any medical conditions you have, such as sleep apnea.  Whether you are pregnant or may be pregnant.  Any use of cigarettes, alcohol, or street drugs. What are the risks? Generally, this is a safe procedure. However, problems may occur, including:  Getting too much medicine (oversedation).  Nausea.  Allergic reaction to medicines.  Trouble breathing. If this happens, a breathing tube may be used to help with breathing. It will be removed when you are awake and breathing on your own.  Heart trouble.  Lung trouble.  Before the procedure Staying hydrated Follow instructions from your health care provider about hydration, which may include:  Up to 2 hours before the procedure - you may continue to drink clear liquids, such as water, clear fruit juice, black coffee, and plain tea.  Eating and drinking restrictions Follow instructions from your health care provider about eating and drinking, which may include:  8 hours before the procedure - stop eating heavy meals or foods such as meat, fried foods, or fatty foods.  6 hours before the procedure - stop eating light meals or foods, such  as toast or cereal.  6 hours before the procedure - stop drinking milk or drinks that contain milk.  2 hours before the procedure - stop drinking clear liquids.  Medicines Ask your health care provider about:  Changing or stopping your regular medicines. This is especially important if you are taking diabetes medicines or blood thinners.  Taking medicines such as aspirin and ibuprofen. These medicines can thin your blood. Do not take these medicines before your procedure if your health care provider instructs you not to.  Tests and exams  You will have a physical exam.  You may have blood tests done to show: ? How well your kidneys and liver are working. ? How well your blood can clot.  General instructions  Plan to have someone take you home from the hospital or clinic.  If you will be going home right after the procedure, plan to have someone with you for 24 hours.  What happens during the procedure?  Your blood pressure, heart rate, breathing, level of pain and overall condition will be monitored.  An IV tube will be inserted into one of your veins.  Your anesthesia specialist will give you medicines as needed to keep you comfortable during the procedure. This may mean changing the level of sedation.  The procedure will be performed. After the procedure  Your blood pressure, heart rate, breathing rate, and blood oxygen level will be monitored until the medicines you were given have worn off.  Do not drive for 24 hours if you received a sedative.  You may: ? Feel sleepy, clumsy, or nauseous. ? Feel forgetful about what happened after the procedure. ? Have a sore throat if you had a breathing tube during the procedure. ? Vomit. This information is not intended to replace advice given to you by your health care provider. Make sure you discuss any questions you have with your health care provider. Document Released: 09/07/2005 Document Revised: 05/20/2016 Document  Reviewed: 04/03/2016 Elsevier Interactive Patient Education  2018 [place], Care After These instructions provide you with information about  caring for yourself after your procedure. Your health care provider may also give you more specific instructions. Your treatment has been planned according to current medical practices, but problems sometimes occur. Call your health care provider if you have any problems or questions after your procedure. What can I expect after the procedure? After your procedure, it is common to:  Feel sleepy for several hours.  Feel clumsy and have poor balance for several hours.  Feel forgetful about what happened after the procedure.  Have poor judgment for several hours.  Feel nauseous or vomit.  Have a sore throat if you had a breathing tube during the procedure.  Follow these instructions at home: For at least 24 hours after the procedure:   Do not: ? Participate in activities in which you could fall or become injured. ? Drive. ? Use heavy machinery. ? Drink alcohol. ? Take sleeping pills or medicines that cause drowsiness. ? Make important decisions or sign legal documents. ? Take care of children on your own.  Rest. Eating and drinking  Follow the diet that is recommended by your health care provider.  If you vomit, drink water, juice, or soup when you can drink without vomiting.  Make sure you have little or no nausea before eating solid foods. General instructions  Have a responsible adult stay with you until you are awake and alert.  Take over-the-counter and prescription medicines only as told by your health care provider.  If you smoke, do not smoke without supervision.  Keep all follow-up visits as told by your health care provider. This is important. Contact a health care provider if:  You keep feeling nauseous or you keep vomiting.  You feel light-headed.  You develop a rash.  You have a  fever. Get help right away if:  You have trouble breathing. This information is not intended to replace advice given to you by your health care provider. Make sure you discuss any questions you have with your health care provider. Document Released: 04/03/2016 Document Revised: 08/03/2016 Document Reviewed: 04/03/2016 Elsevier Interactive Patient Education  [employer].

## 2017-08-31 NOTE — Telephone Encounter (Signed)
New message   Pt daughter is calling to see what the results were that was given to her mother

## 2017-09-04 ENCOUNTER — Telehealth: Payer: Self-pay | Admitting: Cardiology

## 2017-09-04 ENCOUNTER — Encounter (HOSPITAL_COMMUNITY)
Admission: RE | Admit: 2017-09-04 | Discharge: 2017-09-04 | Disposition: A | Discharge status: Home / Self Care | Payer: Medicare Other | Source: Ambulatory Visit | Attending: Internal Medicine | Admitting: Internal Medicine

## 2017-09-04 ENCOUNTER — Encounter (HOSPITAL_COMMUNITY): Payer: Self-pay

## 2017-09-04 DIAGNOSIS — Z1211 Encounter for screening for malignant neoplasm of colon: Secondary | ICD-10-CM | POA: Diagnosis not present

## 2017-09-04 DIAGNOSIS — Z01812 Encounter for preprocedural laboratory examination: Secondary | ICD-10-CM | POA: Insufficient documentation

## 2017-09-04 DIAGNOSIS — Z01810 Encounter for preprocedural cardiovascular examination: Secondary | ICD-10-CM | POA: Insufficient documentation

## 2017-09-04 HISTORY — DX: Gastro-esophageal reflux disease without esophagitis: K21.9

## 2017-09-04 HISTORY — DX: Dyspnea, unspecified: R06.00

## 2017-09-04 HISTORY — DX: Anxiety disorder, unspecified: F41.9

## 2017-09-04 HISTORY — DX: Depression, unspecified: F32.A

## 2017-09-04 HISTORY — DX: Acute myocardial infarction, unspecified: I21.9

## 2017-09-04 HISTORY — DX: Major depressive disorder, single episode, unspecified: F32.9

## 2017-09-04 HISTORY — DX: Angina pectoris, unspecified: I20.9

## 2017-09-04 HISTORY — DX: Unspecified macular degeneration: H35.30

## 2017-09-04 LAB — CBC WITH DIFFERENTIAL/PLATELET
BASOS ABS: 0 10*3/uL (ref 0.0–0.1)
Basophils Relative: 1 %
Eosinophils Absolute: 0.2 10*3/uL (ref 0.0–0.7)
Eosinophils Relative: 3 %
HEMATOCRIT: 43.9 % (ref 36.0–46.0)
Hemoglobin: 14.6 g/dL (ref 12.0–15.0)
LYMPHS PCT: 34 %
Lymphs Abs: 2.1 10*3/uL (ref 0.7–4.0)
MCH: 29.1 pg (ref 26.0–34.0)
MCHC: 33.3 g/dL (ref 30.0–36.0)
MCV: 87.6 fL (ref 78.0–100.0)
MONO ABS: 0.5 10*3/uL (ref 0.1–1.0)
Monocytes Relative: 9 %
NEUTROS ABS: 3.4 10*3/uL (ref 1.7–7.7)
Neutrophils Relative %: 53 %
Platelets: 191 10*3/uL (ref 150–400)
RBC: 5.01 MIL/uL (ref 3.87–5.11)
RDW: 12.5 % (ref 11.5–15.5)
WBC: 6.3 10*3/uL (ref 4.0–10.5)

## 2017-09-04 LAB — BASIC METABOLIC PANEL
ANION GAP: 10 (ref 5–15)
BUN: 14 mg/dL (ref 6–20)
CALCIUM: 9.8 mg/dL (ref 8.9–10.3)
CHLORIDE: 100 mmol/L — AB (ref 101–111)
CO2: 31 mmol/L (ref 22–32)
Creatinine, Ser: 0.73 mg/dL (ref 0.44–1.00)
GFR calc Af Amer: >60 mL/min (ref >60)
GFR calc non Af Amer: >60 mL/min (ref >60)
GLUCOSE: 106 mg/dL — AB (ref 65–99)
Potassium: 3.7 mmol/L (ref 3.5–5.1)
Sodium: 141 mmol/L (ref 135–145)

## 2017-09-04 NOTE — Telephone Encounter (Signed)
New message       [facility] Pre-operative Risk Assessment    Request for surgical clearance:  1. What type of surgery is being performed? Colonoscopy under propofol  2. When is this surgery scheduled? Friday 09/08/17  3. Are there any medications that need to be held prior to surgery and how long? Tommie said she would talk to nurse about this, she needs to know if she is cleared from a cardiology stand point.   4. Name of physician performing surgery? Dr. Laural Golden   5. What is your office phone and fax number? [phone]    Alben Spittle 09/04/2017, 9:47 AM  _________________________________________________________________   (provider comments below)

## 2017-09-04 NOTE — Telephone Encounter (Signed)
Reviewed notes. Pt has an appt tomorrow w Dr. Ellyn Hack for risk stratification r/t this upcoming surgery.

## 2017-09-04 NOTE — Pre-Procedure Instructions (Addendum)
Patient evaluated for upcoming colonoscopy with propofol on 09/08/17.  States to me that she has been having frequent, mid sternal chest pain, radiating into neck on both sides.  States that she has not been utilizing nitroglycerin, as she did not think pain was bad enough, however has been happening several days of the week and is associated with shortness of breath on exertion.  EKG performed, due to patient's symptoms.  Has an appointment with Dr Ellyn Hack tomorrow 09/05/2017 for review of a recent cardiac CT.  Notified office that due to symptoms and pending review of MRI, we would need cardiac clearance, after visit.  Patient and daughter aware of concerns and verbalized understanding.

## 2017-09-05 ENCOUNTER — Encounter: Payer: Self-pay | Admitting: Cardiology

## 2017-09-05 ENCOUNTER — Ambulatory Visit (INDEPENDENT_AMBULATORY_CARE_PROVIDER_SITE_OTHER): Payer: Medicare Other | Admitting: Cardiology

## 2017-09-05 VITALS — BP 136/78 | HR 67 | Ht 59.0 in | Wt 182.2 lb

## 2017-09-05 DIAGNOSIS — I209 Angina pectoris, unspecified: Secondary | ICD-10-CM

## 2017-09-05 DIAGNOSIS — I10 Essential (primary) hypertension: Secondary | ICD-10-CM

## 2017-09-05 DIAGNOSIS — Z01818 Encounter for other preprocedural examination: Secondary | ICD-10-CM

## 2017-09-05 DIAGNOSIS — R931 Abnormal findings on diagnostic imaging of heart and coronary circulation: Secondary | ICD-10-CM | POA: Insufficient documentation

## 2017-09-05 DIAGNOSIS — I25119 Atherosclerotic heart disease of native coronary artery with unspecified angina pectoris: Secondary | ICD-10-CM

## 2017-09-05 DIAGNOSIS — R600 Localized edema: Secondary | ICD-10-CM | POA: Diagnosis not present

## 2017-09-05 DIAGNOSIS — E785 Hyperlipidemia, unspecified: Secondary | ICD-10-CM

## 2017-09-05 DIAGNOSIS — D689 Coagulation defect, unspecified: Secondary | ICD-10-CM | POA: Diagnosis not present

## 2017-09-05 NOTE — Patient Instructions (Signed)
   [name] [address] [place] [address] Dept: [phone] Loc: [phone]  Vickie [name]  09/05/2017  You are scheduled for a Cardiac Catheterization on Monday, September 24 with Dr. Glenetta Hew.  1. Please arrive at the [facility] (Main Entrance A) at [facility]: [address] [place], [place] [address] at 8:00 AM (two hours before your procedure to ensure your preparation). Free valet parking service is available.   Special note: Every effort is made to have your procedure done on time. Please understand that emergencies sometimes delay scheduled procedures.  2. Diet: Do not eat or drink anything after midnight prior to your procedure except sips of water to take medications.  3. Labs: TODAY -- CBC, PT, BMP  4. Medication instructions in preparation for your procedure   On the morning of your procedure, take your Aspirin 81 MG and any morning medicines NOT listed above.  You may use sips of water.  5. Plan for one night stay--bring personal belongings. 6. Bring a current list of your medications and current insurance cards. 7. You MUST have a responsible person to drive you home. 8. Someone MUST be with you the first 24 hours after you arrive home or your discharge will be delayed. 9. Please wear clothes that are easy to get on and off and wear slip-on shoes.  Thank you for allowing [place] to care for you!   -- [facility]   Your physician recommends that you schedule a follow-up appointment in [place]   ( 1ST OR 2ND WEEK OF OCT 2018)

## 2017-09-05 NOTE — Progress Notes (Signed)
PCP: Vickie Sites, MD  Clinic Note: Chief Complaint  Patient presents with  . Follow-up    chest pain evaluation    HPI: Vickie [name] is a 71 y.o. female who is being seen today for follow-up evaluation of chest pain at the request of Vickie Sites, MD.  Vickie [name] is a former patient of Dr. Terance [name] and before that Dr. 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 This vessel looks fine in follow-up In 2009.  Vickie [name] was last seen on August 3 for reevaluation of her chronic intermittent chest pain.she indicates having both resting and exertional chest discomfort. We evaluated her with a coronary CT angiogram with CT if far as noted below.  Recent Hospitalizations: none  Studies Personally Reviewed - (if available, images/films reviewed: From Epic Chart or Care Everywhere)  Coronary calcium score is 358. Moderate calcified stenosis of left main and proximal LAD not likely significant. Sent for CT FFR   that showed FFR of 0.8, diagonal branch and 0.88 the mid circumflex.  Interval History: Vickie [name] continues to note episodes of recurrent chest discomfort and shortness of breath. Doesn't happen with any particular rhyme or reason, but is happening more frequently now. She has prolonged episodes of chest discomfort of various different types including sharp stabbing and burning type symptoms.Sometimes a last over an hour sometimes her last less than a few seconds. They're oftentimes associated with shortness of breath, but she can also get short of breath without chest pain. She also feels fatigued and tired after the episodes and is noticing more exertional dyspnea. She still has occasional flip-flopping and skipping beats in her chest but nothing prolonged. No syncope or near-syncope, but does have some postural  dizziness.  . No TIA/amaurosis fugax symptoms. No melena, hematochezia, hematuria, or epstaxis. No claudication.  ROS: A comprehensive was performed. Review of Systems  Constitutional: Negative for malaise/fatigue and weight loss.  HENT: Negative for congestion.   Respiratory: Positive for cough (better with breathing Rx) and shortness of breath.   Cardiovascular: Positive for chest pain. Negative for palpitations, orthopnea, claudication, leg swelling and PND.  Gastrointestinal: Negative for abdominal pain, blood in stool, heartburn and melena.  Genitourinary: Negative for dysuria, hematuria and urgency.  Musculoskeletal: Positive for back pain.  Neurological: Positive for dizziness and headaches. Negative for focal weakness.  Endo/Heme/Allergies: Negative for environmental allergies. Does not bruise/bleed easily.  Psychiatric/Behavioral: Negative for memory loss.  All other systems reviewed and are negative.   I have reviewed and (if needed) personally updated the patient's problem list, medications, allergies, past medical and surgical history, social and family history.   Past Medical History:  Diagnosis Date  . Anginal pain ([place])   . Anxiety   . Anxiety disorder    With apparent panic attacks  . Arthritis   . Cataract 2017  . COPD (chronic obstructive pulmonary disease) ([place])    PFTs were done in 2008 at [facility]  . Depression   . Diabetes mellitus type II, non insulin dependent ([place])   . Dyspnea   . Essential hypertension   . GERD (gastroesophageal reflux disease)   . Hiatal hernia   . Hyperlipidemia   . Macular degeneration    right eye  . Myocardial infarction ([place])   . Nonocclusive coronary atherosclerosis of native coronary artery 2006 through 2012   multi caths 2012, 2006, 2007 2009 (2007  complicated by catheter-induced dissection of small nondominant RCA, patent in 2009 with no residual abnormality); Myoview August 2013: LOW RISK, normal EF. ; Echocardiogram  Vickie [name] - January 2014) moderate LVH, EF 55-65%. No significant valvular disease.  . OSA (obstructive sleep apnea) 9/25/ 2012   tested 2009; tetested sleep study 07/2011--titration 09/20/2011 now use Bi-PAP    Past Surgical History:  Procedure Laterality Date  . ABDOMINAL HYSTERECTOMY    . CARDIAC CATHETERIZATION  [id number]   Non-occlusice CAD - only 80% ostial SP1;  NON DOMINANNT  RCA (catheter insuced dissection with MI in 2007 --> resolved by 2009 cath)  . cataract Left 2017   Toric lens  . DIAGNOSTIC LAPAROSCOPY Right   . LIPOMA EXCISION     rt anterior abdomen  . PARS PLANA VITRECTOMY W/ REPAIR OF MACULAR HOLE Right    Unsuccessful repair.  Hole filled    Current Meds  Medication Sig  . albuterol (PROAIR HFA) 108 (90 BASE) MCG/ACT inhaler Inhale 1 puff into the lungs every 6 (six) hours as needed for wheezing or shortness of breath.  Marland Kitchen albuterol (PROVENTIL) (2.5 MG/3ML) 0.083% nebulizer solution Take 2.5 mg by nebulization every 6 (six) hours as needed for wheezing or shortness of breath.   . ALPRAZolam (XANAX) 0.5 MG tablet Take 0.25 mg by mouth 2 (two) times daily.   . AMBULATORY NON FORMULARY MEDICATION Inject 300 mg into the skin as directed. Medication Name:Inclisiran sodium 300 mg vs placebo  . aspirin EC 81 MG tablet Take 81 mg by mouth at bedtime.  . Biotin w/ Vitamins C & E (HAIR/SKIN/NAILS PO) Take 1 tablet by mouth at bedtime.  . busPIRone (BUSPAR) 15 MG tablet Take 15 mg by mouth 2 (two) times daily.  . Calcium Carb-Cholecalciferol (CALCIUM 600 + D PO) Take 1 tablet by mouth 2 (two) times daily.  Marland Kitchen CINNAMON PO Take 1 capsule by mouth 2 (two) times daily.  . Coenzyme Q10 (COQ10) 100 MG CAPS Take 100 mg by mouth every morning.  Marland Kitchen CRANBERRY PO Take 1 capsule by mouth 2 (two) times daily.   . enalapril (VASOTEC) 2.5 MG tablet Take 2.5 mg by mouth every evening.   . escitalopram (LEXAPRO) 20 MG tablet Take 20 mg by mouth every morning.   . felodipine (PLENDIL) 2.5  MG 24 hr tablet TAKE ONE TABLET BY MOUTH ONCE DAILY. (Patient taking differently: TAKE ONE TABLET BY MOUTH ONCE DAILY AT BEDTIME)  . furosemide (LASIX) 20 MG tablet Take 20 mg by mouth as directed.  . isosorbide mononitrate (IMDUR) 60 MG 24 hr tablet Take 60 mg by mouth daily.  Marland Kitchen labetalol (NORMODYNE) 200 MG tablet Take one tablet as needed for a blood pressure >180 on the top number. (Patient taking differently: Take 200 mg by mouth daily as needed. Take one tablet as needed for a blood pressure >180 on the top number.)  . meloxicam (MOBIC) 7.5 MG tablet Take 7.5 mg by mouth 2 (two) times daily.  . mirtazapine (REMERON) 15 MG tablet Take 7.5 mg by mouth at bedtime.   . Multiple Vitamin (MULTIVITAMIN) tablet Take 1 tablet by mouth daily.  . naproxen sodium (ANAPROX) 220 MG tablet Take 220 mg by mouth at bedtime as needed (pain).  . nitroGLYCERIN (NITROSTAT) 0.4 MG SL tablet Place 1 tablet (0.4 mg total) under the tongue every 5 (five) minutes as needed for chest pain. Max 3 doses.  . Omega-3 Fatty Acids (FISH OIL PO) Take 1 capsule by mouth at bedtime.  Marland Kitchen  pantoprazole (PROTONIX) 40 MG tablet Take 1 tablet (40 mg total) by mouth daily. (Patient taking differently: Take 40 mg by mouth at bedtime. )  . pravastatin (PRAVACHOL) 40 MG tablet Take 1 tablet (40 mg total) by mouth daily. (Patient taking differently: Take 40 mg by mouth at bedtime. )  . Tiotropium Bromide Monohydrate (SPIRIVA RESPIMAT) 2.5 MCG/ACT AERS Inhale 2.5 mcg into the lungs 2 (two) times daily.    Allergies  Allergen Reactions  . Avelox [Moxifloxacin] Anaphylaxis  . Bactrim [Sulfamethoxazole-Trimethoprim] Shortness Of Breath and Rash    REACTION: Choking, inability to swallow, redness  . Levaquin [Levofloxacin] Shortness Of Breath, Rash and Other (See Comments)    Reaction:Choking Brand name Levaquin ok per pt  . Mucinex [Guaifenesin Er] Shortness Of Breath and Rash  . Sudafed [Pseudoephedrine Hcl] Shortness Of Breath, Rash and  Other (See Comments)    REACTION: Choking, redness, inability to swallow  . Adhesive [Tape] Other (See Comments)    REACTION: redness/irritation at application site. **Certain bandages/adhesives cause this reaction**  . Crestor [Rosuvastatin] Other (See Comments)    Leg pain  . Lipitor [Atorvastatin] Other (See Comments)    Leg pain  . Norvasc [Amlodipine] Other (See Comments)    unknown  . Penicillins Other (See Comments)    "Passed out" Has patient had a PCN reaction causing immediate rash, facial/tongue/throat swelling, SOB or lightheadedness with hypotension: Unknown Has patient had a PCN reaction causing severe rash involving mucus membranes or skin necrosis: No Has patient had a PCN reaction that required hospitalization: No Has patient had a PCN reaction occurring within the last 10 years: No If all of the above answers are "NO", then may proceed with Cephalosporin use.   . Vytorin [Ezetimibe-Simvastatin] Other (See Comments)    Leg pain    Social History   Social History  . Marital status: Married    Spouse name: N/A  . Number of children: N/A  . Years of education: N/A   Social History Main Topics  . Smoking status: Former Smoker    Quit date: 04/18/1977  . Smokeless tobacco: Never Used  . Alcohol use No  . Drug use: No  . Sexual activity: Not Asked   Other Topics Concern  . None   Social History Narrative   Married mother of 4, grandmother of 78. Her mother is 11 years old. Quit smoking 34 years ago. Does not drink alcohol.  Retired from [employer] in 2012.   Usually presents with oldest daughter.   Previously worked out at [employer] regularly walking 1/2-1 mile a day, but no longer able to do so because of the [employer] decision to no longer cover [employer] cost.    family history includes Breast cancer in her maternal aunt and maternal aunt; CAD in her mother.  Wt Readings from Last 3 Encounters:  09/05/17 182 lb 3.2 oz (82.6  kg)  09/04/17 181 lb (82.1 kg)  07/28/17 178 lb 3.2 oz (80.8 kg)    PHYSICAL EXAM BP 136/78   Pulse 67   Ht 4\' 11"  (1.499 m)   Wt 182 lb 3.2 oz (82.6 kg)   SpO2 97%   BMI 36.80 kg/m  Physical Exam  Constitutional: She is oriented to person, place, and time. She appears well-developed and well-nourished. No distress.  Well-groomed. Healthy-appearing  HENT:  Head: Normocephalic.  Eyes: Pupils are equal, round, and reactive to light. EOM are normal.  Neck: Normal range of motion. Neck supple.  No hepatojugular reflux and no JVD present. Carotid bruit is not present.  Cardiovascular: Normal rate, regular rhythm and intact distal pulses.   Occasional extrasystoles are present. PMI is not displaced.  Exam reveals no gallop and no friction rub.   Murmur heard.  Medium-pitched crescendo-decrescendo early systolic murmur is present with a grade of 1/6  at the upper right sternal border radiating to the neck Pulmonary/Chest: Effort normal and breath sounds normal. No respiratory distress. She has no wheezes. She has no rales. She exhibits tenderness.  Abdominal: Soft. Bowel sounds are normal. She exhibits no distension. There is no tenderness. There is no rebound and no guarding.  Musculoskeletal: Normal range of motion. She exhibits edema (not really edema. 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  Neurological: She is alert and oriented to person, place, and time. No cranial nerve deficit.  Skin: Skin is warm and dry. No erythema.  Psychiatric: She has a normal mood and affect. Her behavior is normal. Judgment and thought content normal.  anxious  Nursing note and vitals reviewed.  Adult ECG Report  none  Other studies Reviewed: Additional studies/ records that were reviewed today include:  Recent Labs:   Lab Results  Component Value Date   CHOL 217 (H) 02/10/2017   HDL 53 02/10/2017    LDLCALC 121 (H) 02/10/2017   TRIG 214 (H) 02/10/2017   CHOLHDL 4.1 02/10/2017  Started on the [place] Trial.  Lab Results  Component Value Date   CREATININE 0.73 09/04/2017   BUN 14 09/04/2017   NA 141 09/04/2017   K 3.7 09/04/2017   CL 100 (L) 09/04/2017   CO2 31 09/04/2017      ASSESSMENT / PLAN: Problem List Items Addressed This Visit    Abnormal cardiac CT angiography - Primary   Relevant Orders   LEFT HEART CATHETERIZATION WITH CORONARY ANGIOGRAM   Protime-INR (Completed)   Angina, class III ([place])    Prolonged episodes and now the coronary CT angiogram showing a possible lesion that is FFR borderline. I think is not unreasonable to evaluate this with a cardiac catheterization.  Her recurrent symptoms would be considered at least class III angina.      Relevant Medications   furosemide (LASIX) 20 MG tablet   isosorbide mononitrate (IMDUR) 60 MG 24 hr tablet   Bilateral lower extremity edema (Chronic)   Relevant Orders   LEFT HEART CATHETERIZATION WITH CORONARY ANGIOGRAM   Coronary artery disease involving native coronary artery of native heart with angina pectoris (HCC) (Chronic)    Previously relatively normal cardiac catheterization, however now that she has a coronary CTA which suggests a possibly significant lesion in the diagonal branch.  Recommendations per this report was to proceed with cardiac catheterization. Basilar fact she continues to have symptoms, I think is not unreasonable. Because of the prior catheter-related coronary dissection, despite best for me to opt to go from a femoral approach especially in light of her phocomelia.  We did discuss the risks, benefits, alternatives and indications of cardiac catheterization. See below for details.  I I will tell her to increase her Imdur to twice a day for 3 days after she uses a nitroglycerin tablet but otherwise keep his daily. She is on felodipine for possible coronary spasm which we could increase if  necessary - she was supposed to be on 5 mg. She is on aspirin. He is on a beta blocker but  apparently is only using it when necessary at this point.  She is part of the [place] trial for lipid management.      Relevant Medications   furosemide (LASIX) 20 MG tablet   isosorbide mononitrate (IMDUR) 60 MG 24 hr tablet   Essential hypertension (Chronic)    lood pressure looks pretty good on current meds. I think she could tolerate increasing the fallopian dose. Also potentially put her back on a regular beta blocker.      Relevant Medications   furosemide (LASIX) 20 MG tablet   isosorbide mononitrate (IMDUR) 60 MG 24 hr tablet   Hyperlipidemia LDL goal <70 (Chronic)    On protocol for [place] trial - have not seen f/u labs      Relevant Medications   furosemide (LASIX) 20 MG tablet   isosorbide mononitrate (IMDUR) 60 MG 24 hr tablet   Pre-op evaluation   Relevant Orders   LEFT HEART CATHETERIZATION WITH CORONARY ANGIOGRAM    Other Visit Diagnoses    Clotting disorder ([place])       Relevant Orders   Protime-INR (Completed)   Preop testing       Relevant Orders   Protime-INR (Completed)     She has an upcoming colonoscopy scheduled, but I don't think needs to be rescheduled. It would be nice to go ahead and have her GI evaluation done any potential biopsies done on so that we don't have the question of when the hold the medication the future. The Lesion in question is in a branch vessel with borderline FFR significance. I don't think a borderline FFR on coronary CTA should make her high risk for having a colonoscopy.     Performing MD:  Glenetta Hew, M.D., M.S.  Procedure:  LEFT HEART CATHETERIZATION WITH CORONARY ANGIOGRAPHY AND POSSIBLE PERCUTANEOUS CORONARY INTERVENTION.  The procedure with Risks/Benefits/Alternatives and Indications was reviewed with the patient and daughter.  All questions were answered.    Risks / Complications include, but not limited to: Death, MI, CVA/TIA,  VF/VT (with defibrillation), Bradycardia (need for temporary pacer placement), contrast induced nephropathy, bleeding / bruising / hematoma / pseudoaneurysm, vascular or coronary injury (with possible emergent CT or Vascular Surgery), adverse medication reactions, infection.  Additional risks involving the use of radiation with the possibility of radiation burns and cancer were explained in detail.  The patient (and family) voice understanding and agree to proceed.     Current medicines are reviewed at length with the patient today. (+/- concerns) still having chest pain The following changes have been made: if she takes sublingual nitroglycerin, she should take Imdur twice a day for 3 days after  Patient [place] [address] [place] [address] Dept: [phone] Loc: [place]  09/05/2017  You are scheduled for a Cardiac Catheterization on Monday, September 24 with Dr. Glenetta Hew.  1. Please arrive at the [facility] (Main Entrance A) at [facility]: [address] [place], [place] [address] at 8:00 AM (two hours before your procedure to ensure your preparation). Free valet parking service is available.   Special note: Every effort is made to have your procedure done on time. Please understand that emergencies sometimes delay scheduled procedures.  2. Diet: Do not eat or drink anything after midnight prior to your procedure except sips of water to take medications.  3. Labs: TODAY -- CBC, PT, BMP  4. Medication instructions  in preparation for your procedure   On the morning of your procedure, take your Aspirin 81 MG and any morning medicines NOT listed above.  You may use sips of water.  5. Plan for one night stay--bring personal belongings. 6. Bring a current list of your medications and current insurance cards. 7. You MUST have a  responsible person to drive you home. 8. Someone MUST be with you the first 24 hours after you arrive home or your discharge will be delayed. 9. Please wear clothes that are easy to get on and off and wear slip-on shoes.  Thank you for allowing [place] to care for you!   -- [facility]   Your physician recommends that you schedule a follow-up appointment in [place]   ( 1ST OR 2ND WEEK OF OCT 2018)     Studies Ordered:   Orders Placed This Encounter  Procedures  . Protime-INR  . LEFT HEART CATHETERIZATION WITH CORONARY Illene Silver, M.D., M.S. Interventional Cardiologist   Pager # [phone] Phone # [phone] [address] [place], [place] [address]

## 2017-09-06 ENCOUNTER — Encounter (INDEPENDENT_AMBULATORY_CARE_PROVIDER_SITE_OTHER): Payer: Self-pay | Admitting: *Deleted

## 2017-09-06 LAB — PROTIME-INR
INR: 1 (ref 0.8–1.2)
PROTHROMBIN TIME: 9.9 s (ref 9.1–12.0)

## 2017-09-06 NOTE — Progress Notes (Signed)
I think it would be reasonable for her to have her colonoscopy. She had a coronary calcium score and CT angiogram which suggested a possible diagonal lesion. We are going into heart catheterization to better assess this, but there is not thing that would suggest that his unstable plaque. I think he'll be reasonable for her to have her colonoscopy done prior to having her heart catheterization. This way we don't need to consider stopping antiplatelet agents if needed.  In the past, her heart catheterizations have not shown any significant CAD besides small vessel disease. We are doing a heart catheterization simultaneous a question of whether there is a potential PCI target, but the CT angiogram did not suggested to be a severe lesion since the FFR was borderline.    Glenetta Hew, MD

## 2017-09-07 ENCOUNTER — Encounter: Payer: Self-pay | Admitting: Cardiology

## 2017-09-07 NOTE — Assessment & Plan Note (Addendum)
Prolonged episodes and now the coronary CT angiogram showing a possible lesion that is FFR borderline. I think is not unreasonable to evaluate this with a cardiac catheterization.  Her recurrent symptoms would be considered at least class III angina.

## 2017-09-07 NOTE — Assessment & Plan Note (Signed)
On protocol for [place] trial - have not seen f/u labs

## 2017-09-07 NOTE — Assessment & Plan Note (Signed)
lood pressure looks pretty good on current meds. I think she could tolerate increasing the fallopian dose. Also potentially put her back on a regular beta blocker.

## 2017-09-07 NOTE — Assessment & Plan Note (Signed)
Previously relatively normal cardiac catheterization, however now that she has a coronary CTA which suggests a possibly significant lesion in the diagonal branch.  Recommendations per this report was to proceed with cardiac catheterization. Basilar fact she continues to have symptoms, I think is not unreasonable. Because of the prior catheter-related coronary dissection, despite best for me to opt to go from a femoral approach especially in light of her phocomelia.  We did discuss the risks, benefits, alternatives and indications of cardiac catheterization. See below for details.  I I will tell her to increase her Imdur to twice a day for 3 days after she uses a nitroglycerin tablet but otherwise keep his daily. She is on felodipine for possible coronary spasm which we could increase if necessary - she was supposed to be on 5 mg. She is on aspirin. He is on a beta blocker but apparently is only using it when necessary at this point.  She is part of the [place] trial for lipid management.

## 2017-09-07 NOTE — Telephone Encounter (Signed)
New message   pt verbalized that she is calling for rn   Her colonoscopy has been rescheduled and she wants to   Know if Dr.Harding wants to reschedule the cath because its   not a weeks time in between or will he still continue?

## 2017-09-07 NOTE — Telephone Encounter (Signed)
Returned call to patient.  Patient reports her colonoscopy was cancelled due to inclement weather and was rescheduled for the 20th.   Reports her cath was scheduled for the 24th.    Wondering if she can have both this close together.   Advised that this should be okay as per Dr. Wonda Olds note he was okay with colonoscopy prior.      After speaking with patient she reports she thinks she is going to reschedule her colonoscopy until after her cath as she is more concerned with the heart cath and is only having the colonoscopy completed for a routine screening, she is not having any issues that warranted a colonoscopy.   Advised that essentially this is her decision but if any intervention has to be done during cath she would require medications that may limit her having the colonoscopy after for at least 12 months.   Patient is aware and verbalized understanding.   Again, she states she is only having this done because her insurance "had it on her list to do".       Advised she would need to contact her GI doctor to speak with them but we will keep cath as scheduled and I would make Dr. Ellyn Hack aware.    Patient verbalized understanding.

## 2017-09-11 NOTE — Telephone Encounter (Signed)
The whole point was to get the C-scope done before cath in case she needs PCI/Stent which delays C-scope x 1 yr.  If only screening, then no worries.  Glenetta Hew, MD

## 2017-09-13 ENCOUNTER — Other Ambulatory Visit: Payer: Self-pay | Admitting: Cardiology

## 2017-09-14 ENCOUNTER — Ambulatory Visit (HOSPITAL_COMMUNITY): Admission: RE | Admit: 2017-09-14 | Payer: Medicare Other | Source: Ambulatory Visit | Admitting: Internal Medicine

## 2017-09-14 ENCOUNTER — Encounter (HOSPITAL_COMMUNITY): Admission: RE | Payer: Self-pay | Source: Ambulatory Visit

## 2017-09-14 SURGERY — COLONOSCOPY WITH PROPOFOL
Anesthesia: Monitor Anesthesia Care

## 2017-09-15 ENCOUNTER — Telehealth: Payer: Self-pay

## 2017-09-15 NOTE — Telephone Encounter (Signed)
Patient contacted pre-catheterization at [facility] scheduled for:  09/18/2017 @ 1030 Verified arrival time and place:  [facility] @ 0800 Confirmed AM meds to be taken pre-cath with sip of water: Take ASA Hold lasix Confirmed patient has responsible person to drive home post procedure and observe patient for 24 hours:  yes Addl concerns:  Daughter main learner for mom

## 2017-09-18 ENCOUNTER — Encounter (HOSPITAL_COMMUNITY): Payer: Self-pay | Admitting: Cardiology

## 2017-09-18 ENCOUNTER — Ambulatory Visit (HOSPITAL_COMMUNITY)
Admission: RE | Admit: 2017-09-18 | Discharge: 2017-09-19 | Disposition: A | Discharge status: Home / Self Care | Payer: Medicare Other | Source: Ambulatory Visit | Attending: Cardiology | Admitting: Cardiology

## 2017-09-18 ENCOUNTER — Ambulatory Visit (HOSPITAL_COMMUNITY)
Admission: RE | Disposition: A | Discharge status: Home / Self Care | Payer: Self-pay | Source: Ambulatory Visit | Attending: Cardiology

## 2017-09-18 DIAGNOSIS — I2584 Coronary atherosclerosis due to calcified coronary lesion: Secondary | ICD-10-CM | POA: Insufficient documentation

## 2017-09-18 DIAGNOSIS — I252 Old myocardial infarction: Secondary | ICD-10-CM | POA: Insufficient documentation

## 2017-09-18 DIAGNOSIS — Z880 Allergy status to penicillin: Secondary | ICD-10-CM | POA: Diagnosis not present

## 2017-09-18 DIAGNOSIS — I25119 Atherosclerotic heart disease of native coronary artery with unspecified angina pectoris: Secondary | ICD-10-CM | POA: Diagnosis present

## 2017-09-18 DIAGNOSIS — G4733 Obstructive sleep apnea (adult) (pediatric): Secondary | ICD-10-CM | POA: Diagnosis not present

## 2017-09-18 DIAGNOSIS — J449 Chronic obstructive pulmonary disease, unspecified: Secondary | ICD-10-CM | POA: Diagnosis not present

## 2017-09-18 DIAGNOSIS — K219 Gastro-esophageal reflux disease without esophagitis: Secondary | ICD-10-CM | POA: Insufficient documentation

## 2017-09-18 DIAGNOSIS — M1990 Unspecified osteoarthritis, unspecified site: Secondary | ICD-10-CM | POA: Diagnosis not present

## 2017-09-18 DIAGNOSIS — Z9861 Coronary angioplasty status: Secondary | ICD-10-CM

## 2017-09-18 DIAGNOSIS — I447 Left bundle-branch block, unspecified: Secondary | ICD-10-CM | POA: Diagnosis not present

## 2017-09-18 DIAGNOSIS — D689 Coagulation defect, unspecified: Secondary | ICD-10-CM | POA: Insufficient documentation

## 2017-09-18 DIAGNOSIS — Y840 Cardiac catheterization as the cause of abnormal reaction of the patient, or of later complication, without mention of misadventure at the time of the procedure: Secondary | ICD-10-CM | POA: Diagnosis not present

## 2017-09-18 DIAGNOSIS — I10 Essential (primary) hypertension: Secondary | ICD-10-CM | POA: Diagnosis present

## 2017-09-18 DIAGNOSIS — Z882 Allergy status to sulfonamides status: Secondary | ICD-10-CM | POA: Insufficient documentation

## 2017-09-18 DIAGNOSIS — Z955 Presence of coronary angioplasty implant and graft: Secondary | ICD-10-CM

## 2017-09-18 DIAGNOSIS — Z7982 Long term (current) use of aspirin: Secondary | ICD-10-CM | POA: Diagnosis not present

## 2017-09-18 DIAGNOSIS — Z01818 Encounter for other preprocedural examination: Secondary | ICD-10-CM

## 2017-09-18 DIAGNOSIS — I2510 Atherosclerotic heart disease of native coronary artery without angina pectoris: Secondary | ICD-10-CM

## 2017-09-18 DIAGNOSIS — F419 Anxiety disorder, unspecified: Secondary | ICD-10-CM | POA: Insufficient documentation

## 2017-09-18 DIAGNOSIS — E119 Type 2 diabetes mellitus without complications: Secondary | ICD-10-CM | POA: Insufficient documentation

## 2017-09-18 DIAGNOSIS — F329 Major depressive disorder, single episode, unspecified: Secondary | ICD-10-CM | POA: Insufficient documentation

## 2017-09-18 DIAGNOSIS — Z87891 Personal history of nicotine dependence: Secondary | ICD-10-CM | POA: Insufficient documentation

## 2017-09-18 DIAGNOSIS — I97630 Postprocedural hematoma of a circulatory system organ or structure following a cardiac catheterization: Secondary | ICD-10-CM | POA: Insufficient documentation

## 2017-09-18 DIAGNOSIS — E785 Hyperlipidemia, unspecified: Secondary | ICD-10-CM | POA: Diagnosis not present

## 2017-09-18 DIAGNOSIS — R931 Abnormal findings on diagnostic imaging of heart and coronary circulation: Secondary | ICD-10-CM | POA: Diagnosis present

## 2017-09-18 HISTORY — DX: Scoliosis, unspecified: M41.9

## 2017-09-18 HISTORY — DX: Other chronic pain: G89.29

## 2017-09-18 HISTORY — DX: Pneumonia, unspecified organism: J18.9

## 2017-09-18 HISTORY — PX: LEFT HEART CATH AND CORONARY ANGIOGRAPHY: CATH118249

## 2017-09-18 HISTORY — DX: Obstructive sleep apnea (adult) (pediatric): G47.33

## 2017-09-18 HISTORY — DX: Dorsalgia, unspecified: M54.9

## 2017-09-18 HISTORY — PX: CORONARY PRESSURE/FFR STUDY: CATH118243

## 2017-09-18 HISTORY — PX: CORONARY STENT INTERVENTION: CATH118234

## 2017-09-18 HISTORY — DX: Obstructive sleep apnea (adult) (pediatric): Z99.89

## 2017-09-18 HISTORY — PX: INTRAVASCULAR PRESSURE WIRE/FFR STUDY: CATH118243

## 2017-09-18 HISTORY — DX: Type 2 diabetes mellitus without complications: E11.9

## 2017-09-18 LAB — POCT ACTIVATED CLOTTING TIME
Activated Clotting Time: 213 seconds
Activated Clotting Time: 257 seconds
Activated Clotting Time: 263 seconds
Activated Clotting Time: 164 seconds

## 2017-09-18 LAB — GLUCOSE, CAPILLARY
GLUCOSE-CAPILLARY: 97 mg/dL (ref 65–99)
GLUCOSE-CAPILLARY: 116 mg/dL — AB (ref 65–99)
Glucose-Capillary: 97 mg/dL (ref 65–99)

## 2017-09-18 SURGERY — LEFT HEART CATH AND CORONARY ANGIOGRAPHY
Anesthesia: LOCAL

## 2017-09-18 MED ORDER — MIDAZOLAM HCL 2 MG/2ML IJ SOLN
INTRAMUSCULAR | Status: AC
  Filled 2017-09-18: qty 2

## 2017-09-18 MED ORDER — SODIUM CHLORIDE 0.9 % IV SOLN: 250.0000 mL | INTRAVENOUS | Status: DC | PRN

## 2017-09-18 MED ORDER — ASPIRIN EC 81 MG PO TBEC: 81.0000 mg | DELAYED_RELEASE_TABLET | Freq: Every day | ORAL | Status: DC

## 2017-09-18 MED ORDER — ADENOSINE (DIAGNOSTIC) 140MCG/KG/MIN
INTRAVENOUS | Status: DC | PRN
  Administered 2017-09-18: 140 ug/kg/min via INTRAVENOUS

## 2017-09-18 MED ORDER — HEPARIN SODIUM (PORCINE) 1000 UNIT/ML IJ SOLN
INTRAMUSCULAR | Status: AC
  Filled 2017-09-18: qty 1

## 2017-09-18 MED ORDER — LABETALOL HCL 5 MG/ML IV SOLN: 10.0000 mg | INTRAVENOUS | Status: AC | PRN

## 2017-09-18 MED ORDER — FAMOTIDINE IN NACL 20-0.9 MG/50ML-% IV SOLN
INTRAVENOUS | Status: AC
  Filled 2017-09-18: qty 50

## 2017-09-18 MED ORDER — ACETAMINOPHEN 325 MG PO TABS
650.0000 mg | ORAL_TABLET | ORAL | Status: DC | PRN
  Administered 2017-09-19: 650 mg via ORAL
  Filled 2017-09-18: qty 2

## 2017-09-18 MED ORDER — FELODIPINE ER 2.5 MG PO TB24: 2.5000 mg | ORAL_TABLET | Freq: Every day | ORAL | Status: DC

## 2017-09-18 MED ORDER — SODIUM CHLORIDE 0.9 % IV SOLN
INTRAVENOUS | Status: DC
  Administered 2017-09-18: 09:00:00 via INTRAVENOUS

## 2017-09-18 MED ORDER — CLOPIDOGREL BISULFATE 300 MG PO TABS
ORAL_TABLET | ORAL | Status: DC | PRN
  Administered 2017-09-18: 600 mg via ORAL

## 2017-09-18 MED ORDER — IOPAMIDOL (ISOVUE-370) INJECTION 76%
INTRAVENOUS | Status: DC | PRN
  Administered 2017-09-18: 170 mL via INTRA_ARTERIAL

## 2017-09-18 MED ORDER — HEPARIN (PORCINE) IN NACL 2-0.9 UNIT/ML-% IJ SOLN
INTRAMUSCULAR | Status: AC
  Filled 2017-09-18: qty 1000

## 2017-09-18 MED ORDER — TIOTROPIUM BROMIDE MONOHYDRATE 18 MCG IN CAPS
18.0000 ug | ORAL_CAPSULE | Freq: Every day | RESPIRATORY_TRACT | Status: DC
  Administered 2017-09-19: 18 ug via RESPIRATORY_TRACT
  Filled 2017-09-18: qty 5

## 2017-09-18 MED ORDER — ENALAPRIL MALEATE 2.5 MG PO TABS
2.5000 mg | ORAL_TABLET | Freq: Every evening | ORAL | Status: DC
  Administered 2017-09-18: 2.5 mg via ORAL
  Filled 2017-09-18 (×2): qty 1

## 2017-09-18 MED ORDER — LIDOCAINE HCL (PF) 1 % IJ SOLN
INTRAMUSCULAR | Status: DC | PRN
  Administered 2017-09-18: 18 mL via SUBCUTANEOUS

## 2017-09-18 MED ORDER — SODIUM CHLORIDE 0.9% FLUSH: 3.0000 mL | INTRAVENOUS | Status: DC | PRN

## 2017-09-18 MED ORDER — ONDANSETRON HCL 4 MG/2ML IJ SOLN: 4.0000 mg | Freq: Four times a day (QID) | INTRAMUSCULAR | Status: DC | PRN

## 2017-09-18 MED ORDER — NITROGLYCERIN 1 MG/10 ML FOR IR/CATH LAB
INTRA_ARTERIAL | Status: DC | PRN
  Administered 2017-09-18: 200 ug via INTRACORONARY

## 2017-09-18 MED ORDER — ISOSORBIDE MONONITRATE ER 60 MG PO TB24
60.0000 mg | ORAL_TABLET | Freq: Every day | ORAL | Status: DC
  Administered 2017-09-19: 60 mg via ORAL
  Filled 2017-09-18: qty 1

## 2017-09-18 MED ORDER — HEPARIN (PORCINE) IN NACL 2-0.9 UNIT/ML-% IJ SOLN
INTRAMUSCULAR | Status: DC | PRN
  Administered 2017-09-18: 1000 mL

## 2017-09-18 MED ORDER — PANTOPRAZOLE SODIUM 40 MG PO TBEC
40.0000 mg | DELAYED_RELEASE_TABLET | Freq: Every day | ORAL | Status: DC
  Administered 2017-09-18: 40 mg via ORAL
  Filled 2017-09-18: qty 1

## 2017-09-18 MED ORDER — ALBUTEROL SULFATE (2.5 MG/3ML) 0.083% IN NEBU: 2.5000 mg | INHALATION_SOLUTION | Freq: Four times a day (QID) | RESPIRATORY_TRACT | Status: DC | PRN

## 2017-09-18 MED ORDER — VERAPAMIL HCL 2.5 MG/ML IV SOLN
INTRAVENOUS | Status: AC
Start: 2017-09-18 — End: 2017-09-18
  Filled 2017-09-18: qty 2

## 2017-09-18 MED ORDER — FENTANYL CITRATE (PF) 100 MCG/2ML IJ SOLN
INTRAMUSCULAR | Status: AC
  Filled 2017-09-18: qty 2

## 2017-09-18 MED ORDER — CLOPIDOGREL BISULFATE 300 MG PO TABS
ORAL_TABLET | ORAL | Status: AC
  Filled 2017-09-18: qty 2

## 2017-09-18 MED ORDER — CLOPIDOGREL BISULFATE 75 MG PO TABS
75.0000 mg | ORAL_TABLET | Freq: Every day | ORAL | Status: DC
  Administered 2017-09-19: 75 mg via ORAL
  Filled 2017-09-18: qty 1

## 2017-09-18 MED ORDER — LIDOCAINE HCL 2 % IJ SOLN
INTRAMUSCULAR | Status: AC
Start: 2017-09-18 — End: 2017-09-18
  Filled 2017-09-18: qty 10

## 2017-09-18 MED ORDER — PRAVASTATIN SODIUM 40 MG PO TABS
40.0000 mg | ORAL_TABLET | Freq: Every day | ORAL | Status: DC
  Administered 2017-09-18: 40 mg via ORAL
  Filled 2017-09-18: qty 1

## 2017-09-18 MED ORDER — NITROGLYCERIN 1 MG/10 ML FOR IR/CATH LAB
INTRA_ARTERIAL | Status: AC
  Filled 2017-09-18: qty 10

## 2017-09-18 MED ORDER — TIOTROPIUM BROMIDE MONOHYDRATE 2.5 MCG/ACT IN AERS: 2.5000 ug | INHALATION_SPRAY | Freq: Two times a day (BID) | RESPIRATORY_TRACT | Status: DC

## 2017-09-18 MED ORDER — FELODIPINE ER 2.5 MG PO TB24
2.5000 mg | ORAL_TABLET | Freq: Every day | ORAL | Status: DC
  Administered 2017-09-18: 2.5 mg via ORAL
  Filled 2017-09-18: qty 1

## 2017-09-18 MED ORDER — MIRTAZAPINE 7.5 MG PO TABS
7.5000 mg | ORAL_TABLET | Freq: Every day | ORAL | Status: DC
  Administered 2017-09-18: 7.5 mg via ORAL
  Filled 2017-09-18: qty 1

## 2017-09-18 MED ORDER — IOPAMIDOL (ISOVUE-370) INJECTION 76%
INTRAVENOUS | Status: AC
  Filled 2017-09-18: qty 50

## 2017-09-18 MED ORDER — FENTANYL CITRATE (PF) 100 MCG/2ML IJ SOLN
INTRAMUSCULAR | Status: DC | PRN
  Administered 2017-09-18 (×2): 25 ug via INTRAVENOUS

## 2017-09-18 MED ORDER — HEPARIN SODIUM (PORCINE) 1000 UNIT/ML IJ SOLN
INTRAMUSCULAR | Status: DC | PRN
  Administered 2017-09-18: 6000 [IU] via INTRAVENOUS
  Administered 2017-09-18: 3000 [IU] via INTRAVENOUS
  Administered 2017-09-18: 2000 [IU] via INTRAVENOUS

## 2017-09-18 MED ORDER — IOPAMIDOL (ISOVUE-370) INJECTION 76%
INTRAVENOUS | Status: AC
  Filled 2017-09-18: qty 100

## 2017-09-18 MED ORDER — ALPRAZOLAM 0.25 MG PO TABS
0.2500 mg | ORAL_TABLET | Freq: Two times a day (BID) | ORAL | Status: DC
  Administered 2017-09-18 – 2017-09-19 (×2): 0.25 mg via ORAL
  Filled 2017-09-18 (×2): qty 1

## 2017-09-18 MED ORDER — MIDAZOLAM HCL 2 MG/2ML IJ SOLN
INTRAMUSCULAR | Status: DC | PRN
  Administered 2017-09-18: 2 mg via INTRAVENOUS
  Administered 2017-09-18: 1 mg via INTRAVENOUS

## 2017-09-18 MED ORDER — SODIUM CHLORIDE 0.9% FLUSH: 3.0000 mL | Freq: Two times a day (BID) | INTRAVENOUS | Status: DC

## 2017-09-18 MED ORDER — SODIUM CHLORIDE 0.9 % IV SOLN: INTRAVENOUS | Status: AC

## 2017-09-18 MED ORDER — HYDRALAZINE HCL 20 MG/ML IJ SOLN: 5.0000 mg | INTRAMUSCULAR | Status: AC | PRN

## 2017-09-18 MED ORDER — ANGIOPLASTY BOOK
Freq: Once | Status: AC
  Administered 2017-09-18: 20:00:00
  Filled 2017-09-18: qty 1

## 2017-09-18 MED ORDER — ADENOSINE 12 MG/4ML IV SOLN
INTRAVENOUS | Status: AC
  Filled 2017-09-18: qty 16

## 2017-09-18 MED ORDER — BUSPIRONE HCL 15 MG PO TABS
15.0000 mg | ORAL_TABLET | Freq: Two times a day (BID) | ORAL | Status: DC
  Administered 2017-09-18 – 2017-09-19 (×2): 15 mg via ORAL
  Filled 2017-09-18: qty 1
  Filled 2017-09-18: qty 3
  Filled 2017-09-18: qty 1
  Filled 2017-09-18: qty 3

## 2017-09-18 MED ORDER — FAMOTIDINE IN NACL 20-0.9 MG/50ML-% IV SOLN
INTRAVENOUS | Status: DC | PRN
  Administered 2017-09-18: 20 mg via INTRAVENOUS

## 2017-09-18 MED ORDER — ALBUTEROL SULFATE HFA 108 (90 BASE) MCG/ACT IN AERS: 1.0000 | INHALATION_SPRAY | Freq: Four times a day (QID) | RESPIRATORY_TRACT | Status: DC | PRN

## 2017-09-18 MED ORDER — ESCITALOPRAM OXALATE 20 MG PO TABS
20.0000 mg | ORAL_TABLET | Freq: Every morning | ORAL | Status: DC
  Administered 2017-09-19: 20 mg via ORAL
  Filled 2017-09-18: qty 1
  Filled 2017-09-18: qty 2

## 2017-09-18 SURGICAL SUPPLY — 15 items
BALLN SAPPHIRE 2.0X12 (BALLOONS) ×2
BALLOON SAPPHIRE 2.0X12 (BALLOONS) IMPLANT
CATH INFINITI 5FR MULTPACK ANG (CATHETERS) ×1 IMPLANT
CATH LAUNCHER 5F EBU3.5 (CATHETERS) ×1 IMPLANT
GUIDEWIRE PRESSURE COMET II (WIRE) ×1 IMPLANT
KIT ENCORE 26 ADVANTAGE (KITS) ×1 IMPLANT
KIT ESSENTIALS PG (KITS) ×1 IMPLANT
KIT HEART LEFT (KITS) ×2 IMPLANT
PACK CARDIAC CATHETERIZATION (CUSTOM PROCEDURE TRAY) ×2 IMPLANT
SHEATH PINNACLE 5F 10CM (SHEATH) ×1 IMPLANT
STENT SYNERGY DES 2.25X12 (Permanent Stent) ×1 IMPLANT
SYR MEDRAD MARK V 150ML (SYRINGE) ×2 IMPLANT
TRANSDUCER W/STOPCOCK (MISCELLANEOUS) ×2 IMPLANT
TUBING CIL FLEX 10 FLL-RA (TUBING) ×2 IMPLANT
WIRE EMERALD 3MM-J .035X150CM (WIRE) ×1 IMPLANT

## 2017-09-18 NOTE — Progress Notes (Signed)
Right groin site checked at 1500, noted hematoma 3 cm medial upper thigh. No bleeding, bruising or hematoma noted at right groin site mid upper thigh or pubic area, Pressure held on hematoma and groin site monitored for 15 minutes. 1515 No hematoma, bleeding or bruising noted. Dressing remains dry and intact. 1525 Patient called and reported coughing, on observation noted hematoma mid upper thigh to medial upper thigh. Pressure held at groin site 1525 to 1545 while monitoring hematoma. No change in hematoma during hold. Dressing applied to groin site at 1545. Expressed hematoma upper mid and medial thigh. No hematoma after expressed, bruising only noted at 1600. Rechecked q 15 minutes for 1 hour, 1700 bruising noted upper mid and medial thigh, level 1, no bruising, bleeding or hematoma noted at site.  1900 No change in assessment.

## 2017-09-18 NOTE — Interval H&P Note (Signed)
History and Physical Interval Note:  09/18/2017 9:11 AM  Vickie [name]  has presented today for surgery, with the diagnosis of abnormal cardiac ct  The various methods of treatment have been discussed with the patient and family. After consideration of risks, benefits and other options for treatment, the patient has consented to  Procedure(s): LEFT HEART CATH AND CORONARY ANGIOGRAPHY (N/A) with possible PERCUTANEOUS CORONARY INTERVENTION as a surgical intervention .  The patient's history has been reviewed, patient examined, no change in status, stable for surgery.  I have reviewed the patient's chart and labs.  Questions were answered to the patient's satisfaction.    Cath Lab Visit (complete for each Cath Lab visit)  Clinical Evaluation Leading to the Procedure:   ACS: No.  Non-ACS:    Anginal Classification: CCS II  Anti-ischemic medical therapy: Maximal Therapy (2 or more classes of medications)  Non-Invasive Test Results: Intermediate-risk stress test findings: cardiac mortality 1-3%/year; Coronary CTA with borderline FFR.  Prior CABG: No previous CABG   Glenetta Hew

## 2017-09-18 NOTE — H&P (View-Only) (Signed)
PCP: Sharilyn Sites, MD  Clinic Note: Chief Complaint  Patient presents with  . Follow-up    chest pain evaluation    HPI: Vickie [name] is a 71 y.o. female who is being seen today for follow-up evaluation of chest pain at the request of Sharilyn Sites, MD.  Vickie [name] is a former patient of Dr. Terance Ice and before that Dr. 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 This vessel looks fine in follow-up In 2009.  Vickie [name] was last seen on August 3 for reevaluation of her chronic intermittent chest pain.she indicates having both resting and exertional chest discomfort. We evaluated her with a coronary CT angiogram with CT if far as noted below.  Recent Hospitalizations: none  Studies Personally Reviewed - (if available, images/films reviewed: From Epic Chart or Care Everywhere)  Coronary calcium score is 358. Moderate calcified stenosis of left main and proximal LAD not likely significant. Sent for CT FFR   that showed FFR of 0.8, diagonal branch and 0.88 the mid circumflex.  Interval History: Vickie [name] continues to note episodes of recurrent chest discomfort and shortness of breath. Doesn't happen with any particular rhyme or reason, but is happening more frequently now. She has prolonged episodes of chest discomfort of various different types including sharp stabbing and burning type symptoms.Sometimes a last over an hour sometimes her last less than a few seconds. They're oftentimes associated with shortness of breath, but she can also get short of breath without chest pain. She also feels fatigued and tired after the episodes and is noticing more exertional dyspnea. She still has occasional flip-flopping and skipping beats in her chest but nothing prolonged. No syncope or near-syncope, but does have some postural  dizziness.  . No TIA/amaurosis fugax symptoms. No melena, hematochezia, hematuria, or epstaxis. No claudication.  ROS: A comprehensive was performed. Review of Systems  Constitutional: Negative for malaise/fatigue and weight loss.  HENT: Negative for congestion.   Respiratory: Positive for cough (better with breathing Rx) and shortness of breath.   Cardiovascular: Positive for chest pain. Negative for palpitations, orthopnea, claudication, leg swelling and PND.  Gastrointestinal: Negative for abdominal pain, blood in stool, heartburn and melena.  Genitourinary: Negative for dysuria, hematuria and urgency.  Musculoskeletal: Positive for back pain.  Neurological: Positive for dizziness and headaches. Negative for focal weakness.  Endo/Heme/Allergies: Negative for environmental allergies. Does not bruise/bleed easily.  Psychiatric/Behavioral: Negative for memory loss.  All other systems reviewed and are negative.   I have reviewed and (if needed) personally updated the patient's problem list, medications, allergies, past medical and surgical history, social and family history.   Past Medical History:  Diagnosis Date  . Anginal pain ([place])   . Anxiety   . Anxiety disorder    With apparent panic attacks  . Arthritis   . Cataract 2017  . COPD (chronic obstructive pulmonary disease) ([place])    P[facility] were done in [facility] at [facility]  . Depression   . Diabetes mellitus type II, non insulin dependent ([place])   . Dyspnea   . Essential hypertension   . GERD (gastroesophageal reflux disease)   . Hiatal hernia   . Hyperlipidemia   . Macular degeneration    right eye  . Myocardial infarction ([place])   . Nonocclusive coronary atherosclerosis of native coronary artery 2006 through 2012   multi caths 2012, 2006, 2007 2009 (2007  complicated by catheter-induced dissection of small nondominant RCA, patent in 2009 with no residual abnormality); Myoview August 2013: LOW RISK, normal EF. ; Echocardiogram  [name] - January 2014) moderate LVH, EF 55-65%. No significant valvular disease.  . OSA (obstructive sleep apnea) 9/25/ 2012   tested 2009; tetested sleep study 07/2011--titration 09/20/2011 now use Bi-PAP    Past Surgical History:  Procedure Laterality Date  . ABDOMINAL HYSTERECTOMY    . CARDIAC CATHETERIZATION  [phone]   Non-occlusice CAD - only 80% ostial SP1;  NON DOMINANNT  RCA (catheter insuced dissection with MI in 2007 --> resolved by 2009 cath)  . cataract Left 2017   Toric lens  . DIAGNOSTIC LAPAROSCOPY Right   . LIPOMA EXCISION     rt anterior abdomen  . PARS PLANA VITRECTOMY W/ REPAIR OF MACULAR HOLE Right    Unsuccessful repair.  Hole filled    Current Meds  Medication Sig  . albuterol (PROAIR HFA) 108 (90 BASE) MCG/ACT inhaler Inhale 1 puff into the lungs every 6 (six) hours as needed for wheezing or shortness of breath.  Marland Kitchen albuterol (PROVENTIL) (2.5 MG/3ML) 0.083% nebulizer solution Take 2.5 mg by nebulization every 6 (six) hours as needed for wheezing or shortness of breath.   . ALPRAZolam (XANAX) 0.5 MG tablet Take 0.25 mg by mouth 2 (two) times daily.   . AMBULATORY NON FORMULARY MEDICATION Inject 300 mg into the skin as directed. Medication Name:Inclisiran sodium 300 mg vs placebo  . aspirin EC 81 MG tablet Take 81 mg by mouth at bedtime.  . Biotin w/ Vitamins C & E (HAIR/SKIN/NAILS PO) Take 1 tablet by mouth at bedtime.  . busPIRone (BUSPAR) 15 MG tablet Take 15 mg by mouth 2 (two) times daily.  . Calcium Carb-Cholecalciferol (CALCIUM 600 + D PO) Take 1 tablet by mouth 2 (two) times daily.  Marland Kitchen CINNAMON PO Take 1 capsule by mouth 2 (two) times daily.  . Coenzyme Q10 (COQ10) 100 MG CAPS Take 100 mg by mouth every morning.  Marland Kitchen CRANBERRY PO Take 1 capsule by mouth 2 (two) times daily.   . enalapril (VASOTEC) 2.5 MG tablet Take 2.5 mg by mouth every evening.   . escitalopram (LEXAPRO) 20 MG tablet Take 20 mg by mouth every morning.   . felodipine (PLENDIL) 2.5  MG 24 hr tablet TAKE ONE TABLET BY MOUTH ONCE DAILY. (Patient taking differently: TAKE ONE TABLET BY MOUTH ONCE DAILY AT BEDTIME)  . furosemide (LASIX) 20 MG tablet Take 20 mg by mouth as directed.  . isosorbide mononitrate (IMDUR) 60 MG 24 hr tablet Take 60 mg by mouth daily.  Marland Kitchen labetalol (NORMODYNE) 200 MG tablet Take one tablet as needed for a blood pressure >180 on the top number. (Patient taking differently: Take 200 mg by mouth daily as needed. Take one tablet as needed for a blood pressure >180 on the top number.)  . meloxicam (MOBIC) 7.5 MG tablet Take 7.5 mg by mouth 2 (two) times daily.  . mirtazapine (REMERON) 15 MG tablet Take 7.5 mg by mouth at bedtime.   . Multiple Vitamin (MULTIVITAMIN) tablet Take 1 tablet by mouth daily.  . naproxen sodium (ANAPROX) 220 MG tablet Take 220 mg by mouth at bedtime as needed (pain).  . nitroGLYCERIN (NITROSTAT) 0.4 MG SL tablet Place 1 tablet (0.4 mg total) under the tongue every 5 (five) minutes as needed for chest pain. Max 3 doses.  . Omega-3 Fatty Acids (FISH OIL PO) Take 1 capsule by mouth at bedtime.  Marland Kitchen  pantoprazole (PROTONIX) 40 MG tablet Take 1 tablet (40 mg total) by mouth daily. (Patient taking differently: Take 40 mg by mouth at bedtime. )  . pravastatin (PRAVACHOL) 40 MG tablet Take 1 tablet (40 mg total) by mouth daily. (Patient taking differently: Take 40 mg by mouth at bedtime. )  . Tiotropium Bromide Monohydrate (SPIRIVA RESPIMAT) 2.5 MCG/ACT AERS Inhale 2.5 mcg into the lungs 2 (two) times daily.    Allergies  Allergen Reactions  . Avelox [Moxifloxacin] Anaphylaxis  . Bactrim [Sulfamethoxazole-Trimethoprim] Shortness Of Breath and Rash    REACTION: Choking, inability to swallow, redness  . Levaquin [Levofloxacin] Shortness Of Breath, Rash and Other (See Comments)    Reaction:Choking Brand name Levaquin ok per pt  . Mucinex [Guaifenesin Er] Shortness Of Breath and Rash  . Sudafed [Pseudoephedrine Hcl] Shortness Of Breath, Rash and  Other (See Comments)    REACTION: Choking, redness, inability to swallow  . Adhesive [Tape] Other (See Comments)    REACTION: redness/irritation at application site. **Certain bandages/adhesives cause this reaction**  . Crestor [Rosuvastatin] Other (See Comments)    Leg pain  . Lipitor [Atorvastatin] Other (See Comments)    Leg pain  . Norvasc [Amlodipine] Other (See Comments)    unknown  . Penicillins Other (See Comments)    "Passed out" Has patient had a PCN reaction causing immediate rash, facial/tongue/throat swelling, SOB or lightheadedness with hypotension: Unknown Has patient had a PCN reaction causing severe rash involving mucus membranes or skin necrosis: No Has patient had a PCN reaction that required hospitalization: No Has patient had a PCN reaction occurring within the last 10 years: No If all of the above answers are "NO", then may proceed with Cephalosporin use.   . Vytorin [Ezetimibe-Simvastatin] Other (See Comments)    Leg pain    Social History   Social History  . Marital status: Married    Spouse name: N/A  . Number of children: N/A  . Years of education: N/A   Social History Main Topics  . Smoking status: Former Smoker    Quit date: 04/18/1977  . Smokeless tobacco: Never Used  . Alcohol use No  . Drug use: No  . Sexual activity: Not Asked   Other Topics Concern  . None   Social History Narrative   Married mother of 4, grandmother of 79. Her mother is 27 years old. Quit smoking 34 years ago. Does not drink alcohol.  Retired from [employer] in 2012.   Usually presents with oldest daughter.   Previously worked out at [employer] regularly walking 1/2-1 mile a day, but no longer able to do so because of the [employer] decision to no longer cover [employer] cost.    family history includes Breast cancer in her maternal aunt and maternal aunt; CAD in her mother.  Wt Readings from Last 3 Encounters:  09/05/17 182 lb 3.2 oz (82.6  kg)  09/04/17 181 lb (82.1 kg)  07/28/17 178 lb 3.2 oz (80.8 kg)    PHYSICAL EXAM BP 136/78   Pulse 67   Ht 4\' 11"  (1.499 m)   Wt 182 lb 3.2 oz (82.6 kg)   SpO2 97%   BMI 36.80 kg/m  Physical Exam  Constitutional: She is oriented to person, place, and time. She appears well-developed and well-nourished. No distress.  Well-groomed. Healthy-appearing  HENT:  Head: Normocephalic.  Eyes: Pupils are equal, round, and reactive to light. EOM are normal.  Neck: Normal range of motion. Neck supple.  No hepatojugular reflux and no JVD present. Carotid bruit is not present.  Cardiovascular: Normal rate, regular rhythm and intact distal pulses.   Occasional extrasystoles are present. PMI is not displaced.  Exam reveals no gallop and no friction rub.   Murmur heard.  Medium-pitched crescendo-decrescendo early systolic murmur is present with a grade of 1/6  at the upper right sternal border radiating to the neck Pulmonary/Chest: Effort normal and breath sounds normal. No respiratory distress. She has no wheezes. She has no rales. She exhibits tenderness.  Abdominal: Soft. Bowel sounds are normal. She exhibits no distension. There is no tenderness. There is no rebound and no guarding.  Musculoskeletal: Normal range of motion. She exhibits edema (not really edema. 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  Neurological: She is alert and oriented to person, place, and time. No cranial nerve deficit.  Skin: Skin is warm and dry. No erythema.  Psychiatric: She has a normal mood and affect. Her behavior is normal. Judgment and thought content normal.  anxious  Nursing note and vitals reviewed.  Adult ECG Report  none  Other studies Reviewed: Additional studies/ records that were reviewed today include:  Recent Labs:   Lab Results  Component Value Date   CHOL 217 (H) 02/10/2017   HDL 53 02/10/2017    LDLCALC 121 (H) 02/10/2017   TRIG 214 (H) 02/10/2017   CHOLHDL 4.1 02/10/2017  Started on the [place] Trial.  Lab Results  Component Value Date   CREATININE 0.73 09/04/2017   BUN 14 09/04/2017   NA 141 09/04/2017   K 3.7 09/04/2017   CL 100 (L) 09/04/2017   CO2 31 09/04/2017      ASSESSMENT / PLAN: Problem List Items Addressed This Visit    Abnormal cardiac CT angiography - Primary   Relevant Orders   LEFT HEART CATHETERIZATION WITH CORONARY ANGIOGRAM   Protime-INR (Completed)   Angina, class III ([place])    Prolonged episodes and now the coronary CT angiogram showing a possible lesion that is FFR borderline. I think is not unreasonable to evaluate this with a cardiac catheterization.  Her recurrent symptoms would be considered at least class III angina.      Relevant Medications   furosemide (LASIX) 20 MG tablet   isosorbide mononitrate (IMDUR) 60 MG 24 hr tablet   Bilateral lower extremity edema (Chronic)   Relevant Orders   LEFT HEART CATHETERIZATION WITH CORONARY ANGIOGRAM   Coronary artery disease involving native coronary artery of native heart with angina pectoris (HCC) (Chronic)    Previously relatively normal cardiac catheterization, however now that she has a coronary CTA which suggests a possibly significant lesion in the diagonal branch.  Recommendations per this report was to proceed with cardiac catheterization. Basilar fact she continues to have symptoms, I think is not unreasonable. Because of the prior catheter-related coronary dissection, despite best for me to opt to go from a femoral approach especially in light of her phocomelia.  We did discuss the risks, benefits, alternatives and indications of cardiac catheterization. See below for details.  I I will tell her to increase her Imdur to twice a day for 3 days after she uses a nitroglycerin tablet but otherwise keep his daily. She is on felodipine for possible coronary spasm which we could increase if  necessary - she was supposed to be on 5 mg. She is on aspirin. He is on a beta blocker but  apparently is only using it when necessary at this point.  She is part of the [place] trial for lipid management.      Relevant Medications   furosemide (LASIX) 20 MG tablet   isosorbide mononitrate (IMDUR) 60 MG 24 hr tablet   Essential hypertension (Chronic)    lood pressure looks pretty good on current meds. I think she could tolerate increasing the fallopian dose. Also potentially put her back on a regular beta blocker.      Relevant Medications   furosemide (LASIX) 20 MG tablet   isosorbide mononitrate (IMDUR) 60 MG 24 hr tablet   Hyperlipidemia LDL goal <70 (Chronic)    On protocol for [place] trial - have not seen f/u labs      Relevant Medications   furosemide (LASIX) 20 MG tablet   isosorbide mononitrate (IMDUR) 60 MG 24 hr tablet   Pre-op evaluation   Relevant Orders   LEFT HEART CATHETERIZATION WITH CORONARY ANGIOGRAM    Other Visit Diagnoses    Clotting disorder ([place])       Relevant Orders   Protime-INR (Completed)   Preop testing       Relevant Orders   Protime-INR (Completed)     She has an upcoming colonoscopy scheduled, but I don't think needs to be rescheduled. It would be nice to go ahead and have her GI evaluation done any potential biopsies done on so that we don't have the question of when the hold the medication the future. The Lesion in question is in a branch vessel with borderline FFR significance. I don't think a borderline FFR on coronary CTA should make her high risk for having a colonoscopy.     Performing MD:  Glenetta Hew, M.D., M.S.  Procedure:  LEFT HEART CATHETERIZATION WITH CORONARY ANGIOGRAPHY AND POSSIBLE PERCUTANEOUS CORONARY INTERVENTION.  The procedure with Risks/Benefits/Alternatives and Indications was reviewed with the patient and daughter.  All questions were answered.    Risks / Complications include, but not limited to: Death, MI, CVA/TIA,  VF/VT (with defibrillation), Bradycardia (need for temporary pacer placement), contrast induced nephropathy, bleeding / bruising / hematoma / pseudoaneurysm, vascular or coronary injury (with possible emergent CT or Vascular Surgery), adverse medication reactions, infection.  Additional risks involving the use of radiation with the possibility of radiation burns and cancer were explained in detail.  The patient (and family) voice understanding and agree to proceed.     Current medicines are reviewed at length with the patient today. (+/- concerns) still having chest pain The following changes have been made: if she takes sublingual nitroglycerin, she should take Imdur twice a day for 3 days after  Patient [place] [address] [place] [address] Dept: [phone] Loc: [place]  09/05/2017  You are scheduled for a Cardiac Catheterization on Monday, September 24 with Dr. Glenetta Hew.  1. Please arrive at the [facility] (Main Entrance A) at [facility]: [address] [place], [place] [address] at 8:00 AM (two hours before your procedure to ensure your preparation). Free valet parking service is available.   Special note: Every effort is made to have your procedure done on time. Please understand that emergencies sometimes delay scheduled procedures.  2. Diet: Do not eat or drink anything after midnight prior to your procedure except sips of water to take medications.  3. Labs: TODAY -- CBC, PT, BMP  4. Medication instructions  in preparation for your procedure   On the morning of your procedure, take your Aspirin 81 MG and any morning medicines NOT listed above.  You may use sips of water.  5. Plan for one night stay--bring personal belongings. 6. Bring a current list of your medications and current insurance cards. 7. You MUST have a  responsible person to drive you home. 8. Someone MUST be with you the first 24 hours after you arrive home or your discharge will be delayed. 9. Please wear clothes that are easy to get on and off and wear slip-on shoes.  Thank you for allowing [place] to care for you!   -- [facility]   Your physician recommends that you schedule a follow-up appointment in [place]   ( 1ST OR 2ND WEEK OF OCT 2018)     Studies Ordered:   Orders Placed This Encounter  Procedures  . Protime-INR  . LEFT HEART CATHETERIZATION WITH CORONARY Illene Silver, M.D., M.S. Interventional Cardiologist   Pager # [phone] Phone # [phone] [address] [place], [place] [address]

## 2017-09-18 NOTE — Progress Notes (Signed)
Site area: right groin  Site Prior to Removal:  Level 0  Pressure Applied For 22 MINUTES    Minutes Beginning at 1353  Manual:   Yes.    Patient Status During Pull:  stable  Post Pull Groin Site:  Level 0  Post Pull Instructions Given:  Yes.    Post Pull Pulses Present:  Yes.    Dressing Applied:  Yes.    Comments:  Rechecked at 1430 with no change in assessment. Dressing dry and intact. Level 0, Right pedal pulse +2.

## 2017-09-18 NOTE — Care Management Note (Signed)
Case Management Note  Patient Details  Name: Vickie [name] MRN: [id number] Date of Birth: 19-Mar-1946  Subjective/Objective:  From home, s/p coronary stent intervention, will be on plavix /asa.                  Action/Plan: NCM will follow for dc needs.  Expected Discharge Date:                  Expected Discharge Plan:  Home/Self Care  In-House Referral:     Discharge planning Services  CM Consult  Post Acute Care Choice:    Choice offered to:     DME Arranged:    DME Agency:     HH Arranged:    [place] Agency:     Status of Service:  Completed, signed off  If discussed at [employer] of Stay Meetings, dates discussed:    Additional Comments:  Zenon Mayo, RN 09/18/2017, 2:51 PM

## 2017-09-18 NOTE — Progress Notes (Signed)
1730 Patient complained about pressure, unable to urinate using perwick external urinary device. Several attempts to assist using breathing and relaxing and running water. Bladder scan 970 mls noted. In and out catheter by Dina Rich RN resulted in 975 mls residual clear yellow urine. Patient verbalized relief and resumed use of external catheter if needed prior to bedrest completed.

## 2017-09-19 ENCOUNTER — Encounter (HOSPITAL_COMMUNITY): Payer: Self-pay | Admitting: Cardiology

## 2017-09-19 DIAGNOSIS — Z955 Presence of coronary angioplasty implant and graft: Secondary | ICD-10-CM

## 2017-09-19 DIAGNOSIS — G4733 Obstructive sleep apnea (adult) (pediatric): Secondary | ICD-10-CM | POA: Diagnosis not present

## 2017-09-19 DIAGNOSIS — I252 Old myocardial infarction: Secondary | ICD-10-CM | POA: Diagnosis not present

## 2017-09-19 DIAGNOSIS — J449 Chronic obstructive pulmonary disease, unspecified: Secondary | ICD-10-CM | POA: Diagnosis not present

## 2017-09-19 DIAGNOSIS — E785 Hyperlipidemia, unspecified: Secondary | ICD-10-CM | POA: Diagnosis not present

## 2017-09-19 DIAGNOSIS — Z882 Allergy status to sulfonamides status: Secondary | ICD-10-CM | POA: Diagnosis not present

## 2017-09-19 DIAGNOSIS — R931 Abnormal findings on diagnostic imaging of heart and coronary circulation: Secondary | ICD-10-CM

## 2017-09-19 DIAGNOSIS — I25119 Atherosclerotic heart disease of native coronary artery with unspecified angina pectoris: Secondary | ICD-10-CM | POA: Diagnosis not present

## 2017-09-19 DIAGNOSIS — I447 Left bundle-branch block, unspecified: Secondary | ICD-10-CM | POA: Diagnosis not present

## 2017-09-19 DIAGNOSIS — K219 Gastro-esophageal reflux disease without esophagitis: Secondary | ICD-10-CM | POA: Diagnosis not present

## 2017-09-19 DIAGNOSIS — I2584 Coronary atherosclerosis due to calcified coronary lesion: Secondary | ICD-10-CM | POA: Diagnosis not present

## 2017-09-19 DIAGNOSIS — I10 Essential (primary) hypertension: Secondary | ICD-10-CM

## 2017-09-19 DIAGNOSIS — Z880 Allergy status to penicillin: Secondary | ICD-10-CM | POA: Diagnosis not present

## 2017-09-19 DIAGNOSIS — D689 Coagulation defect, unspecified: Secondary | ICD-10-CM | POA: Diagnosis not present

## 2017-09-19 DIAGNOSIS — E119 Type 2 diabetes mellitus without complications: Secondary | ICD-10-CM | POA: Diagnosis not present

## 2017-09-19 DIAGNOSIS — F329 Major depressive disorder, single episode, unspecified: Secondary | ICD-10-CM | POA: Diagnosis not present

## 2017-09-19 DIAGNOSIS — F419 Anxiety disorder, unspecified: Secondary | ICD-10-CM | POA: Diagnosis not present

## 2017-09-19 DIAGNOSIS — I97630 Postprocedural hematoma of a circulatory system organ or structure following a cardiac catheterization: Secondary | ICD-10-CM | POA: Diagnosis not present

## 2017-09-19 LAB — CBC
HEMATOCRIT: 38.2 % (ref 36.0–46.0)
HEMOGLOBIN: 12.2 g/dL (ref 12.0–15.0)
MCH: 28 pg (ref 26.0–34.0)
MCHC: 31.9 g/dL (ref 30.0–36.0)
MCV: 87.8 fL (ref 78.0–100.0)
Platelets: 177 10*3/uL (ref 150–400)
RBC: 4.35 MIL/uL (ref 3.87–5.11)
RDW: 12.6 % (ref 11.5–15.5)
WBC: 6.2 10*3/uL (ref 4.0–10.5)

## 2017-09-19 LAB — BASIC METABOLIC PANEL
ANION GAP: 7 (ref 5–15)
BUN: 10 mg/dL (ref 6–20)
CHLORIDE: 105 mmol/L (ref 101–111)
CO2: 27 mmol/L (ref 22–32)
Calcium: 9 mg/dL (ref 8.9–10.3)
Creatinine, Ser: 0.61 mg/dL (ref 0.44–1.00)
GFR calc Af Amer: >60 mL/min (ref >60)
GLUCOSE: 120 mg/dL — AB (ref 65–99)
POTASSIUM: 3.5 mmol/L (ref 3.5–5.1)
Sodium: 139 mmol/L (ref 135–145)

## 2017-09-19 LAB — GLUCOSE, CAPILLARY: Glucose-Capillary: 114 mg/dL — ABNORMAL HIGH (ref 65–99)

## 2017-09-19 MED ORDER — CLOPIDOGREL BISULFATE 75 MG PO TABS: 75.0000 mg | ORAL_TABLET | Freq: Every day | ORAL | 1 refills | Status: DC

## 2017-09-19 MED ORDER — ASPIRIN EC 81 MG PO TBEC: 81.0000 mg | DELAYED_RELEASE_TABLET | Freq: Every day | ORAL | Status: DC

## 2017-09-19 MED FILL — Adenosine IV Soln 12 MG/4ML: INTRAVENOUS | Qty: 16 | Status: AC

## 2017-09-19 MED FILL — Lidocaine HCl Local Inj 2%: INTRAMUSCULAR | Qty: 10 | Status: AC

## 2017-09-19 NOTE — Progress Notes (Signed)
CARDIAC REHAB PHASE I   PRE:  Rate/Rhythm: 68 SR    BP: sitting 112/59    SaO2:   MODE:  Ambulation: 360 ft   POST:  Rate/Rhythm: 96 SR    BP: sitting 135/80     SaO2:   Pt tolerated fairly well. Her activity has been limited by angina. No CP today, feels much better. She is SOB but sts this is her norm from COPD. Groin sore. To recliner. Ed completed with good reception. Understands importance of Plavix. Encouraged her to increase activity/ex. Will refer to [place].  [phone]   Darrick Meigs CES, ACSM 09/19/2017 9:36 AM

## 2017-09-19 NOTE — Discharge Summary (Signed)
The patient has been seen in conjunction with Harmon Dun. All aspects of care have been considered and discussed. The patient has been personally interviewed, examined, and all clinical data has been reviewed.   Right Femoral access site is soft. No hematoma noted.  All laboratory data is stable. Patient is ambulated without chest discomfort or apparent clinical problems. ECG this morning reveals nonspecific ST-T abnormality. No acute ischemia or significant change from prior.  Plan discharge today with follow-up as noted     Discharge Summary    Patient ID: Vickie [name],  MRN: [id number], DOB/AGE: Jan 12, 1946 71 y.o.  Admit date: 09/18/2017 Discharge date: 09/19/2017  Primary Care Provider: Sharilyn Sites Primary Cardiologist: Ellyn Hack  Discharge Diagnoses    Principal Problem:   Abnormal cardiac CT angiography Active Problems:   Essential hypertension   Coronary artery disease involving native coronary artery of native heart with angina pectoris ([place])   Hyperlipidemia LDL goal <70   CAD S/P percutaneous coronary angioplasty   Status post coronary artery stent placement   Allergies Allergies  Allergen Reactions  . Adhesive [Tape] Other (See Comments)    REACTION: redness/irritation at application site. **Certain bandages/adhesives cause this reaction**  . Avelox [Moxifloxacin] Anaphylaxis  . Bactrim [Sulfamethoxazole-Trimethoprim] Shortness Of Breath and Rash    REACTION: Choking, inability to swallow, redness  . Levaquin [Levofloxacin] Shortness Of Breath, Rash and Other (See Comments)    Reaction:Choking Brand name Levaquin ok per pt  . Mucinex [Guaifenesin Er] Shortness Of Breath and Rash  . Penicillins Other (See Comments)    "Passed out" Has patient had a PCN reaction causing immediate rash, facial/tongue/throat swelling, SOB or lightheadedness with hypotension: Unknown Has patient had a PCN reaction causing severe rash involving mucus membranes or skin  necrosis: No Has patient had a PCN reaction that required hospitalization: No Has patient had a PCN reaction occurring within the last 10 years: No If all of the above answers are "NO", then may proceed with Cephalosporin use.   [place] [Pseudoephedrine Hcl] Shortness Of Breath, Rash and Other (See Comments)    REACTION: Choking, redness, inability to swallow  . Crestor [Rosuvastatin] Other (See Comments)    Leg pain  . Lipitor [Atorvastatin] Other (See Comments)    Leg pain  . Vytorin [Ezetimibe-Simvastatin] Other (See Comments)    Leg pain  . Norvasc [Amlodipine] Other (See Comments)    unknown    Diagnostic Studies/Procedures    Cath: 09/18/17  Conclusion     2nd Diag lesion, 80 %stenosed. FFR 0.78.  A STENT SYNERGY DES 2.25X12 drug eluting stent was successfully placed. Postdilated to perform a meter.  Post intervention, there is a 0% residual stenosis.  Prox LAD lesion, 40 %stenosed. Focal calcified lesion at the first of the perforator and small first diagonal branch.  Prox Cx lesion, 40 %stenosed.  The left ventricular systolic function is normal. The left ventricular ejection fraction is 55-65% by visual estimate.  LV end diastolic pressure is normal.  Rate Related LBBB (rate in mid 70s)   Single vessel CAD as predicted by CT FFR. Successful FFR guided PCI of the 2nd Diag Branch with DES.  Plan: Mostly because the patient's anxiety, we will monitor her tonight with planned discharge tomorrow morning.  She will be on aspirin plus Plavix for minimum of 3 months at which time we can stop aspirin if necessary.  She has not been on a beta blocker in the past because of fatigue. -Would simply continue home medications.  She does have what appears to be a rate related left bundle branch block converting to left bundle in the 70s bpm   _____________   History of Present Illness     71 yo female with PMH of Nonobstructive CAD, HTN, HL, COPD, DM and GERD who  was seen in the office 09/05/17 with Dr. Ellyn Hack for follow up. Noted to have chest pain back in 8/18 and underwent coronary CT with calcium score of 358 and moderate calcified stenosis of the left main and pLAD. Then sent for CT FFR that showed 0.8 diag branch and 0.88 in the mid Lcx. At office visit reported worsening symptoms and was referred for cardiac cath.    Hospital Course     Presented for outpatient cath on 09/18/17 with Dr. Ellyn Hack and underwent successful PCI/DES x1 to the 2nd diag. Plan for DAPT with ASA/Plavix for at least 3 months, then can consider stopping ASA if needed. No BB 2/2 to hx of fatigue per office notes. Post cath developed a groin hematoma that resolved with manual pressure, but now soft with residual bruising noted. Post cath labs showed Cr 0.61 and Hgb 12.2. Worked well with cardiac rehab without any recurrent chest pain.  General: Well developed, well nourished, female appearing in no acute distress. Head: Normocephalic, atraumatic.  Neck: Supple without bruits, JVD. Lungs:  Resp regular and unlabored, CTA. Heart: RRR, S1, S2, no S3, S4, or murmur; no rub. Abdomen: Soft, non-tender, non-distended with normoactive bowel sounds. No hepatomegaly. No rebound/guarding. No obvious abdominal masses. Extremities: No clubbing, cyanosis, edema. Distal pedal pulses are 2+ bilaterally. R femoral cath site stable with bruising but no hematoma.  Neuro: Alert and oriented X 3. Moves all extremities spontaneously. Psych: Normal affect.  [name] was seen by Dr. Tamala Julian and determined stable for discharge home. Follow up in the office has been arranged. Medications are listed below.   _____________  Discharge Vitals Blood pressure (!) 116/54, pulse 85, temperature 98.4 F (36.9 C), temperature source Oral, resp. rate 15, height 4\' 11"  (1.499 m), weight 178 lb 9.2 oz (81 kg), SpO2 93 %.  Filed Weights   09/18/17 0800 09/19/17 0233  Weight: 182 lb (82.6 kg) 178 lb 9.2 oz  (81 kg)    Labs & Radiologic Studies    CBC  Recent Labs  09/19/17 0205  WBC 6.2  HGB 12.2  HCT 38.2  MCV 87.8  PLT 175   Basic Metabolic Panel  Recent Labs  09/19/17 0205  NA 139  K 3.5  CL 105  CO2 27  GLUCOSE 120*  BUN 10  CREATININE 0.61  CALCIUM 9.0   Liver Function Tests No results for input(s): AST, ALT, ALKPHOS, BILITOT, PROT, ALBUMIN in the last 72 hours. No results for input(s): LIPASE, AMYLASE in the last 72 hours. Cardiac Enzymes No results for input(s): CKTOTAL, CKMB, CKMBINDEX, TROPONINI in the last 72 hours. BNP Invalid input(s): POCBNP D-Dimer No results for input(s): DDIMER in the last 72 hours. Hemoglobin A1C No results for input(s): HGBA1C in the last 72 hours. Fasting Lipid Panel No results for input(s): CHOL, HDL, LDLCALC, TRIG, CHOLHDL, LDLDIRECT in the last 72 hours. Thyroid Function Tests No results for input(s): TSH, T4TOTAL, T3FREE, THYROIDAB in the last 72 hours.  Invalid input(s): FREET3 _____________  Ct Coronary Morph W/cta Cor W/score W/ca W/cm &/or Wo/cm  Addendum Date: 08/24/2017   ADDENDUM REPORT: 08/24/2017 17:58 ADDENDUM: Study sent for FFR-CT due to calciific disease in LAD diagonal FFRCT abnormal  in diagonal.80 Also noted borderline in mid circumflex .56 Results reviewed with Dr Ellyn Hack Electronically Signed   By: Jenkins Rouge M.D.   On: 08/24/2017 17:58   Addendum Date: 08/23/2017   ADDENDUM REPORT: 08/23/2017 10:22 CLINICAL DATA:  Chest pain EXAM: Cardiac CTA MEDICATIONS: Sub lingual nitro. 4mg  and lopressor 10mg  TECHNIQUE: The patient was scanned on a Siemens 902 slice scanner. Gantry rotation speed was 250 msecs. Collimation was.6 mm . A 100 kV prospective scan was triggered in the ascending thoracic aorta at 140 HU's with full mA between 35-75% of the R-R interval. Average HR during the scan was 54 bpm. The 3D data set was interpreted on a dedicated work station using MPR, MIP and VRT modes. A total of 80cc of contrast  was used. FINDINGS: Non-cardiac: See separate report from [facility]. No significant findings on limited lung and soft tissue windows. Calcium Score:  358 with dense calcium in LM and proximal LAD Coronary Arteries: Right dominant with no anomalies LM:  Less then 50% calcified stenosis LAD:  50% calcified and mixed plaque in ostial and proximal vessel D1:  Small and normal D2:  Large and less than 30% calcified disease in ostium Circumflex:  Less than 50% calcified disease proximal and mid vessel OM1:  Normal OM2: Normal AV:  Normal RCA: Dominant but most of PDA territory supplied by wrap around LAD normal PDA:  Small normal PLA:  Normal IMPRESSION: 1) Calcium score 358 which is 89 % for age and sex 2) Moderate calcified stenosis in LM and proximal LAD not likely hemodynamically significant but given location will send study for FFR CT 3) Aortic atherosclerosis Jenkins Rouge Electronically Signed   By: Jenkins Rouge M.D.   On: 08/23/2017 10:22   Result Date: 08/24/2017 EXAM: OVER-READ INTERPRETATION  CT CHEST The following report is an over-read performed by radiologist Dr. Collene Leyden [facility], [place] on 08/23/2017. This over-read does not include interpretation of cardiac or coronary anatomy or pathology. The coronary CTA interpretation by the cardiologist is attached. COMPARISON:  11/16/2016 FINDINGS: Cardiovascular: Heart is normal size. Visualized aorta is normal caliber with scattered calcifications in the arch and descending thoracic aorta. Mediastinum/Nodes: No adenopathy in the lower mediastinum or hila. Lungs/Pleura: No confluent opacities or effusions. Upper Abdomen: Small low-density areas in the liver, likely small cysts. No acute findings. Musculoskeletal: Chest wall soft tissues are unremarkable. No acute bony abnormality. IMPRESSION: No acute extracardiac abnormality. Aortic atherosclerosis. Electronically Signed: By: Rolm Baptise M.D. On: 08/23/2017 09:38   Disposition   Pt  is being discharged home today in good condition.  Follow-up Plans & Appointments    Follow-up Information    Leonie Man, MD Follow up on 09/29/2017.   Specialty:  Cardiology Why:  at 3pm for your follow up appt.  Contact information: [address] [place] [address] [phone]          Discharge Instructions    Amb Referral to Cardiac Rehabilitation    Complete by:  As directed    Diagnosis:   Coronary Stents PTCA     Call MD for:  redness, tenderness, or signs of infection (pain, swelling, redness, odor or green/yellow discharge around incision site)    Complete by:  As directed    Diet - low sodium heart healthy    Complete by:  As directed    Discharge instructions    Complete by:  As directed    Groin Site Care Refer to this sheet  in the next few weeks. These instructions provide you with information on caring for yourself after your procedure. Your caregiver may also give you more specific instructions. Your treatment has been planned according to current medical practices, but problems sometimes occur. Call your caregiver if you have any problems or questions after your procedure. HOME CARE INSTRUCTIONS You may shower 24 hours after the procedure. Remove the bandage (dressing) and gently wash the site with plain soap and water. Gently pat the site dry.  Do not apply powder or lotion to the site.  Do not sit in a bathtub, swimming pool, or whirlpool for 5 to 7 days.  No bending, squatting, or lifting anything over 10 pounds (4.5 kg) as directed by your caregiver.  Inspect the site at least twice daily.  Do not drive home if you are discharged the same day of the procedure. Have someone else drive you.  You may drive 24 hours after the procedure unless otherwise instructed by your caregiver.  What to expect: Any bruising will usually fade within 1 to 2 weeks.  Blood that collects in the tissue (hematoma) may be painful to the touch. It should  usually decrease in size and tenderness within 1 to 2 weeks.  SEEK IMMEDIATE MEDICAL CARE IF: You have unusual pain at the groin site or down the affected leg.  You have redness, warmth, swelling, or pain at the groin site.  You have drainage (other than a small amount of blood on the dressing).  You have chills.  You have a fever or persistent symptoms for more than 72 hours.  You have a fever and your symptoms suddenly get worse.  Your leg becomes pale, cool, tingly, or numb.  You have heavy bleeding from the site. Hold pressure on the site. Marland Kitchen  PLEASE DO NOT MISS ANY DOSES OF YOUR PLAVIX!!!!! Also keep a log of you blood pressures and bring back to your follow up appt. Please call the office with any questions.   Patients taking blood thinners should generally stay away from medicines like ibuprofen, Advil, Motrin, naproxen, and Aleve due to risk of stomach bleeding. You may take Tylenol as directed or talk to your primary doctor about alternatives.   Increase activity slowly    Complete by:  As directed       Discharge Medications     Medication List    STOP taking these medications   meloxicam 7.5 MG tablet Commonly known as:  MOBIC   naproxen sodium 220 MG tablet Commonly known as:  ANAPROX     TAKE these medications   PROAIR HFA 108 (90 Base) MCG/ACT inhaler Generic drug:  albuterol Inhale 1 puff into the lungs every 6 (six) hours as needed for wheezing or shortness of breath.   albuterol (2.5 MG/3ML) 0.083% nebulizer solution Commonly known as:  PROVENTIL Take 2.5 mg by nebulization every 6 (six) hours as needed for wheezing or shortness of breath.   ALPRAZolam 0.5 MG tablet Commonly known as:  XANAX Take 0.25 mg by mouth 2 (two) times daily.   AMBULATORY NON FORMULARY MEDICATION Inject 300 mg into the skin as directed. Medication Name:Inclisiran sodium 300 mg vs placebo   aspirin EC 81 MG tablet Take 1 tablet (81 mg total) by mouth daily. What changed:  when  to take this   busPIRone 15 MG tablet Commonly known as:  BUSPAR Take 15 mg by mouth 2 (two) times daily.   CALCIUM 600 + D PO Take 1 tablet  by mouth 2 (two) times daily.   CINNAMON PO Take 1 capsule by mouth 2 (two) times daily.   clopidogrel 75 MG tablet Commonly known as:  PLAVIX Take 1 tablet (75 mg total) by mouth daily with breakfast.   CoQ10 100 MG Caps Take 100 mg by mouth every morning.   CRANBERRY PO Take 1 capsule by mouth 2 (two) times daily.   enalapril 2.5 MG tablet Commonly known as:  VASOTEC Take 2.5 mg by mouth every evening.   escitalopram 20 MG tablet Commonly known as:  LEXAPRO Take 20 mg by mouth every morning.   felodipine 2.5 MG 24 hr tablet Commonly known as:  PLENDIL TAKE ONE TABLET BY MOUTH ONCE DAILY. What changed:  See the new instructions.   FISH OIL PO Take 1 capsule by mouth at bedtime.   furosemide 20 MG tablet Commonly known as:  LASIX Take 20 mg by mouth as directed.   HAIR/SKIN/NAILS PO Take 1 tablet by mouth at bedtime.   isosorbide mononitrate 60 MG 24 hr tablet Commonly known as:  IMDUR TAKE ONE TABLET BY MOUTH ONCE DAILY.   labetalol 200 MG tablet Commonly known as:  NORMODYNE Take one tablet as needed for a blood pressure >180 on the top number. What changed:  how much to take  how to take this  when to take this  reasons to take this  additional instructions   mirtazapine 15 MG tablet Commonly known as:  REMERON Take 7.5 mg by mouth at bedtime.   multivitamin tablet Take 1 tablet by mouth daily.   nitroGLYCERIN 0.4 MG SL tablet Commonly known as:  NITROSTAT Place 1 tablet (0.4 mg total) under the tongue every 5 (five) minutes as needed for chest pain. Max 3 doses.   pantoprazole 40 MG tablet Commonly known as:  PROTONIX Take 1 tablet (40 mg total) by mouth daily. What changed:  when to take this   pravastatin 40 MG tablet Commonly known as:  PRAVACHOL Take 1 tablet (40 mg total) by mouth  daily. What changed:  when to take this   SPIRIVA RESPIMAT 2.5 MCG/ACT Aers Generic drug:  Tiotropium Bromide Monohydrate Inhale 2.5 mcg into the lungs 2 (two) times daily.        Aspirin prescribed at discharge?  Yes High Intensity Statin Prescribed? (Lipitor 40-80mg  or Crestor 20-40mg ): No: on pravastatin, unable to tolerate others Beta Blocker Prescribed? No: chronic fatigue For EF <40%, was ACEI/ARB Prescribed? No: EF ok ADP Receptor Inhibitor Prescribed? (i.e. Plavix etc.-Includes Medically Managed Patients): Yes For EF <40%, Aldosterone Inhibitor Prescribed? No: EF ok Was EF assessed during THIS hospitalization? Yes Was Cardiac Rehab II ordered? (Included Medically managed Patients): Yes   Outstanding Labs/Studies   N/a   Duration of Discharge Encounter   Greater than 30 minutes including physician time.  Signed, Reino Bellis NP-C 09/19/2017, 10:30 AM

## 2017-09-20 ENCOUNTER — Other Ambulatory Visit: Payer: Self-pay | Admitting: Cardiology

## 2017-09-20 DIAGNOSIS — I10 Essential (primary) hypertension: Secondary | ICD-10-CM

## 2017-09-26 ENCOUNTER — Telehealth: Payer: Self-pay | Admitting: Cardiology

## 2017-09-26 NOTE — Telephone Encounter (Signed)
New message    Pt daughter is calling with some questions. Pt daughter has concerns since pt had procedure. She would like a call back.    Pt c/o Shortness Of Breath: STAT if SOB developed within the last 24 hours or pt is noticeably SOB on the phone  1. Are you currently SOB (can you hear that pt is SOB on the phone)? No   2. How long have you been experiencing SOB? Since procedure  3. Are you SOB when sitting or when up moving around? Up moving around  4. Are you currently experiencing any other symptoms? Very tired  STAT if HR is under 50 or over 120 (normal HR is 60-100 beats per minute)  1) What is your heart rate? 101  2) Do you have a log of your heart rate readings (document readings)? no  3) Do you have any other symptoms? Sob, tired

## 2017-09-26 NOTE — Telephone Encounter (Signed)
LEFT HEART CATH W/STENTand INTRAVASCULAR PRESSURE WIRE 09/18/2017 - 09/19/2017 (28 hours) [place] Come[place](daughterDPR) she says that she is still fatigued, lightheaded and a little SOB(not from her COPD it's different) she is walking around for 5 minutes at a time as directed but this "wipes her out" and has to rest more than normal and is constantly fatigued taking naps more than normal and has had a nose bleed and this stopped almost immediately. Pt is bacially doing nothing and still feeling fatigued. Denies chest pain or pressure. Reviewed medications, pt is taking all medications as directed. Pain and bruising to the procedure site  continue (this is to be expected) and when she used the restroom she noticed tinged blood on the tissue (informed that this is not related-she will call PCP if needed). Daughter states that pt's BP is running as normal 130's/60's but her HR is higher than normal running 98-101. Daughter is concerned with these sx continuing, please advise.

## 2017-09-27 ENCOUNTER — Other Ambulatory Visit: Payer: Self-pay | Admitting: Cardiology

## 2017-09-27 DIAGNOSIS — R0609 Other forms of dyspnea: Secondary | ICD-10-CM | POA: Diagnosis not present

## 2017-09-27 DIAGNOSIS — N342 Other urethritis: Secondary | ICD-10-CM | POA: Diagnosis not present

## 2017-09-27 DIAGNOSIS — Z6841 Body Mass Index (BMI) 40.0 and over, adult: Secondary | ICD-10-CM | POA: Diagnosis not present

## 2017-09-27 DIAGNOSIS — R5383 Other fatigue: Secondary | ICD-10-CM | POA: Diagnosis not present

## 2017-09-27 DIAGNOSIS — Z1389 Encounter for screening for other disorder: Secondary | ICD-10-CM | POA: Diagnosis not present

## 2017-09-27 NOTE — Telephone Encounter (Signed)
Spoke to a daughter , she states patient has been very fatigue, chills , no fever noted,  Daughter is not a ware of changes with groin site-she does not some dependent bruising. RN instructed daughter to check for any warmth ,drainage  Discomfort lower abd or back. Contact office once she talks with patient and symptoms are present. Also contact primary ,and keep appointment on 09/29/17 with Dr Ellyn Hack.  Daughter voiced understanding.

## 2017-09-27 NOTE — Telephone Encounter (Signed)
Patient has an appointment on 0ct. 5, 2018

## 2017-09-29 ENCOUNTER — Ambulatory Visit: Payer: Medicare Other | Admitting: Cardiology

## 2017-09-29 ENCOUNTER — Encounter: Payer: Self-pay | Admitting: Cardiology

## 2017-09-29 ENCOUNTER — Ambulatory Visit (INDEPENDENT_AMBULATORY_CARE_PROVIDER_SITE_OTHER): Payer: Medicare Other | Admitting: Cardiology

## 2017-09-29 VITALS — BP 110/60 | HR 91 | Ht 59.0 in | Wt 179.0 lb

## 2017-09-29 DIAGNOSIS — I2510 Atherosclerotic heart disease of native coronary artery without angina pectoris: Secondary | ICD-10-CM

## 2017-09-29 DIAGNOSIS — I10 Essential (primary) hypertension: Secondary | ICD-10-CM

## 2017-09-29 DIAGNOSIS — E785 Hyperlipidemia, unspecified: Secondary | ICD-10-CM

## 2017-09-29 DIAGNOSIS — I25119 Atherosclerotic heart disease of native coronary artery with unspecified angina pectoris: Secondary | ICD-10-CM | POA: Diagnosis not present

## 2017-09-29 DIAGNOSIS — I959 Hypotension, unspecified: Secondary | ICD-10-CM | POA: Diagnosis not present

## 2017-09-29 DIAGNOSIS — Z9861 Coronary angioplasty status: Secondary | ICD-10-CM | POA: Diagnosis not present

## 2017-09-29 NOTE — Patient Instructions (Addendum)
DO NOT TAKE THE FELODIPINE FOR NOW UNTIL NEXT OFFICE APPOINTMENT.     KEEP HYDRATED - [place] EAT    Your physician recommends that you schedule a follow-up appointment in 1 MONTH WITH EXTENDER- B. STRADER PA

## 2017-09-29 NOTE — Progress Notes (Signed)
PCP: Sharilyn Sites, MD  Clinic Note: Chief Complaint  Patient presents with  . Follow-up    F/U post CATH.  Marland Kitchen Shortness of Breath  . Fatigue  . Chest Pain    Discomfort on left side.    HPI: Vickie [name] is a 71 y.o. female who is being seen today for post cath follow-up evaluation -- abnormal TAA with hemodynamically significant lesion in the diagonal branch found on cardiac catheterization now status post PCI.She also has been complaining about fatigue and fevers or chills.   Vickie [name] is a former patient of Dr. Terance Ice and before that Dr. Janene Madeira.She has had long-standing history of nonobstructive coronary disease but had a cardiac catheterization 2007, catheter related dissection of the nondominant right coronary artery. This vessel looks fine in follow-up In 2009.  Vickie [name] was last seen on 09/05/2017 to discuss results were coronary calcium score. We decided to proceed with cardiac catheterization.  Recent Hospitalizations: none  Studies Personally Reviewed - (if available, images/films reviewed: From Epic Chart or Care Everywhere)  09/18/2017: CORONARY STENT INTERVENTION  INTRAVASCULAR PRESSURE WIRE/FFR STUDY - 2ND DIAGONAL  LEFT HEART CATH AND CORONARY ANGIOGRAPHY   Conclusion   2nd Diag lesion, 80 %stenosed. FFR 0.78.  A STENT SYNERGY DES 2.25X12 drug eluting stent was successfully placed. Postdilated to perform a meter.  Post intervention, there is a 0% residual stenosis.  Prox LAD lesion, 40 %stenosed. Focal calcified lesion at the first of the perforator and small first diagonal branch.  Prox Cx lesion, 40 %stenosed.  The left ventricular systolic function is normal. The left ventricular ejection fraction is 55-65% by visual estimate.  LV end diastolic pressure is mild to moderately elevated (19-21 mmHg)  Rate Related LBBB (rate in mid 70s)   Single vessel CAD as predicted by CT FFR. Successful FFR guided PCI of the 2nd Diag  Branch with DES.   Diagnostic Diagram       Post-Intervention Diagram             Interval History: [place] today seemingly feeling poorly, fatigued, tired. Apparently she has had some dysuria symptoms and has been started on antibiotic for UTI. Is very lethargic and tired today. But she does not note any more is any the chest discomfort. She does note exertional dyspnea, but no anginal type chest pain - > what she describes is left lateral chest wall pain. No PND or orthopnea, but she is not sleeping well. She feels dizzy and lightheaded, especially today.No edema. Unfortunately, since her discharge, she has been feeling somewhat poorly for most the time. She had a nosebleed a night or 2 ago. On the first morning after her PCI, she was walking around fine without any difficulty. Of course she has a large bruise from issues with sheath removal and hematoma. -> He says her PCP checked labs including CBC which we need to see.  No rapid irregular heartbeats palpitations - just rare flip-flops. She feels tired and fatigued, but has not had any syncope or near-syncope. No TIA or amaurosis fugax symptoms.   ROS: A comprehensive was performed. Review of Systems  Constitutional: Positive for fever (Over 101 today) and malaise/fatigue. Negative for weight loss.  HENT: Negative for congestion and hearing loss.   Respiratory: Positive for cough (better with breathing Rx) and shortness of breath.   Cardiovascular: Positive for chest pain (Chest wall). Negative for claudication.  Gastrointestinal: Negative for abdominal pain, blood in stool, heartburn and  melena.  Genitourinary: Positive for dysuria and frequency. Negative for flank pain, hematuria and urgency.  Musculoskeletal: Positive for back pain.  Neurological: Positive for dizziness and headaches. Negative for focal weakness.  Endo/Heme/Allergies: Negative for environmental allergies. Does not bruise/bleed easily.  Psychiatric/Behavioral:  Negative for memory loss. The patient has insomnia.   All other systems reviewed and are negative.   I have reviewed and (if needed) personally updated the patient's problem list, medications, allergies, past medical and surgical history, social and family history.   Past Medical History:  Diagnosis Date  . Anginal pain ([place])   . Anxiety   . Anxiety disorder    With apparent panic attacks  . Arthritis    "hands, knees, back" (09/18/2017)  . CAD S/P percutaneous coronary angioplasty 08/2017   Single-vessel CAD involving second diagonal branch treated with DES stent Synergy 2.25 mm x 12 mm (2.4 mm)  . Chronic back pain    "all over" (09/18/2017)  . COPD (chronic obstructive pulmonary disease) ([place])    PFTs were done in 2008 at [facility]  . Depression   . Dyspnea   . Essential hypertension   . GERD (gastroesophageal reflux disease)   . Hiatal hernia   . History of left bundle branch block (LBBB) 08/2017   Rate Related LBBB noted in cath lab  . Hyperlipidemia   . Macular degeneration    right eye  . Myocardial infarction [facility]) 2007   "medically induced"  . Nonocclusive coronary atherosclerosis of native coronary artery 2006 through 2012   multi caths 2012, 2006, [id number] (0175 complicated by catheter-induced dissection of small nondominant RCA, patent in 2009 with no residual abnormality); Myoview August 2013: LOW RISK, normal EF. ; Echocardiogram [name] - January 2014) moderate LVH, EF 55-65%. No significant valvular disease.  . OSA (obstructive sleep apnea) 9/25/ 2012   tested 2009; tetested sleep study 07/2011--titration 09/20/2011 now use Bi-PAP  . OSA on CPAP   . Pneumonia    "several times in 2017; 3 times already this year" (09/18/2017)  . Scoliosis   . Type II diabetes mellitus ([place])     Past Surgical History:  Procedure Laterality Date  . CARDIAC CATHETERIZATION  1025,8527; 2009   Non-occlusice CAD - only 80% ostial SP1;  NON DOMINANNT  RCA (catheter insuced  dissection with MI in 2007 --> resolved by 2009 cath)  . CATARACT EXTRACTION, BILATERAL Bilateral 2017   Toric lens "in the left eye only"  . CORONARY STENT INTERVENTION N/A 09/18/2017   Procedure: CORONARY STENT INTERVENTION;  Surgeon: Leonie Man, MD;  Location: [facility] INVASIVE CV LAB: DES PCI Diag2: Synergy DES 2.25 mm x 12 mm (2.4 mm)  . DIAGNOSTIC LAPAROSCOPY Right   . DILATION AND CURETTAGE OF UTERUS    . EYE SURGERY    . INTRAVASCULAR PRESSURE WIRE/FFR STUDY N/A 09/18/2017   Procedure: INTRAVASCULAR PRESSURE WIRE/FFR STUDY;  Surgeon: Leonie Man, MD;  Location: [place] CV LAB;  Service: Cardiovascular: FFR Diag 2 -- 0.78 (significcant) --> PCI  . KNEE ARTHROSCOPY Right   . LEFT HEART CATH AND CORONARY ANGIOGRAPHY N/A 09/18/2017   Procedure: LEFT HEART CATH AND CORONARY ANGIOGRAPHY;  Surgeon: Leonie Man, MD;  Location: [facility] INVASIVE CV LAB: Culpril - 80% Diag2 (FFR 0.78)- PCI.  pLAD 40%, pCx 40%. Post PCI LBBB. LVEDP - ~20 mmHg. EF 55-60%  . LIPOMA EXCISION Right ~ 2015   anterior abdomen  . PARS PLANA VITRECTOMY W/ REPAIR OF MACULAR HOLE Right  Unsuccessful repair.  Hole filled  . VAGINAL HYSTERECTOMY      Current Meds  Medication Sig  . albuterol (PROAIR HFA) 108 (90 BASE) MCG/ACT inhaler Inhale 1 puff into the lungs every 6 (six) hours as needed for wheezing or shortness of breath.  Marland Kitchen albuterol (PROVENTIL) (2.5 MG/3ML) 0.083% nebulizer solution Take 2.5 mg by nebulization every 6 (six) hours as needed for wheezing or shortness of breath.   . ALPRAZolam (XANAX) 0.5 MG tablet Take 0.25 mg by mouth 2 (two) times daily.   . AMBULATORY NON FORMULARY MEDICATION Inject 300 mg into the skin as directed. Medication Name:Inclisiran sodium 300 mg vs placebo  . aspirin EC 81 MG tablet Take 1 tablet (81 mg total) by mouth daily.  . Biotin w/ Vitamins C & E (HAIR/SKIN/NAILS PO) Take 1 tablet by mouth at bedtime.  . busPIRone (BUSPAR) 15 MG tablet Take 15 mg by mouth 2 (two) times  daily.  . Calcium Carb-Cholecalciferol (CALCIUM 600 + D PO) Take 1 tablet by mouth 2 (two) times daily.  Marland Kitchen CINNAMON PO Take 1 capsule by mouth 2 (two) times daily.  . clopidogrel (PLAVIX) 75 MG tablet Take 1 tablet (75 mg total) by mouth daily with breakfast.  . Coenzyme Q10 (COQ10) 100 MG CAPS Take 100 mg by mouth every morning.  Marland Kitchen CRANBERRY PO Take 1 capsule by mouth 2 (two) times daily.   Marland Kitchen doxycycline (VIBRAMYCIN) 100 MG capsule Take 100 mg by mouth 2 (two) times daily.  . enalapril (VASOTEC) 2.5 MG tablet Take 2.5 mg by mouth every evening.   . escitalopram (LEXAPRO) 20 MG tablet Take 20 mg by mouth every morning.   . felodipine (PLENDIL) 2.5 MG 24 hr tablet TAKE ONE TABLET BY MOUTH ONCE DAILY. (Patient taking differently: TAKE ONE TABLET BY MOUTH ONCE DAILY AT BEDTIME)  . furosemide (LASIX) 20 MG tablet Take 20 mg by mouth as directed.  . isosorbide mononitrate (IMDUR) 60 MG 24 hr tablet TAKE ONE TABLET BY MOUTH ONCE DAILY.  Marland Kitchen labetalol (NORMODYNE) 200 MG tablet TAKE ONE TABLET BY MOUTH AS NEEDED FOR BLOOD PRESSURE IF TOP NUMBER IS GREATER THAN 180.  . mirtazapine (REMERON) 15 MG tablet Take 7.5 mg by mouth at bedtime.   . Multiple Vitamin (MULTIVITAMIN) tablet Take 1 tablet by mouth daily.  . nitroGLYCERIN (NITROSTAT) 0.4 MG SL tablet Place 1 tablet (0.4 mg total) under the tongue every 5 (five) minutes as needed for chest pain. Max 3 doses.  . Omega-3 Fatty Acids (FISH OIL PO) Take 1 capsule by mouth at bedtime.  . pantoprazole (PROTONIX) 40 MG tablet TAKE ONE TABLET BY MOUTH ONCE DAILY.  . pravastatin (PRAVACHOL) 40 MG tablet Take 1 tablet (40 mg total) by mouth daily. (Patient taking differently: Take 40 mg by mouth at bedtime. )  . Tiotropium Bromide Monohydrate (SPIRIVA RESPIMAT) 2.5 MCG/ACT AERS Inhale 2.5 mcg into the lungs 2 (two) times daily.    Allergies  Allergen Reactions  . Adhesive [Tape] Other (See Comments)    REACTION: redness/irritation at application site. **Certain  bandages/adhesives cause this reaction**  . Avelox [Moxifloxacin] Anaphylaxis  . Bactrim [Sulfamethoxazole-Trimethoprim] Shortness Of Breath and Rash    REACTION: Choking, inability to swallow, redness  . Levaquin [Levofloxacin] Shortness Of Breath, Rash and Other (See Comments)    Reaction:Choking Brand name Levaquin ok per pt  . Mucinex [Guaifenesin Er] Shortness Of Breath and Rash  . Penicillins Other (See Comments)    "Passed out" Has patient had a PCN  reaction causing immediate rash, facial/tongue/throat swelling, SOB or lightheadedness with hypotension: Unknown Has patient had a PCN reaction causing severe rash involving mucus membranes or skin necrosis: No Has patient had a PCN reaction that required hospitalization: No Has patient had a PCN reaction occurring within the last 10 years: No If all of the above answers are "NO", then may proceed with Cephalosporin use.   [place] [Pseudoephedrine Hcl] Shortness Of Breath, Rash and Other (See Comments)    REACTION: Choking, redness, inability to swallow  . Crestor [Rosuvastatin] Other (See Comments)    Leg pain  . Lipitor [Atorvastatin] Other (See Comments)    Leg pain  . Vytorin [Ezetimibe-Simvastatin] Other (See Comments)    Leg pain  . Norvasc [Amlodipine] Other (See Comments)    unknown    Social History   Social History  . Marital status: Married    Spouse name: N/A  . Number of children: N/A  . Years of education: N/A   Social History Main Topics  . Smoking status: Former Smoker    Packs/day: 3.00    Years: 8.50    Types: Cigarettes    Quit date: 04/18/1977  . Smokeless tobacco: Never Used  . Alcohol use No  . Drug use: No  . Sexual activity: Not Asked   Other Topics Concern  . None   Social History Narrative   Married mother of 4, grandmother of 79. Her mother is 84 years old. Quit smoking 34 years ago. Does not drink alcohol.  Retired from [employer] in 2012.   Usually presents with oldest  daughter.   Previously worked out at [employer] regularly walking 1/2-1 mile a day, but no longer able to do so because of the [employer] decision to no longer cover [employer] cost.    family history includes Breast cancer in her maternal aunt and maternal aunt; CAD in her mother.  Wt Readings from Last 3 Encounters:  09/29/17 179 lb (81.2 kg)  09/19/17 178 lb 9.2 oz (81 kg)  09/05/17 182 lb 3.2 oz (82.6 kg)    PHYSICAL EXAM BP 110/60 (BP Location: Right Arm)   Pulse 91   Ht 4\' 11"  (1.499 m)   Wt 179 lb (81.2 kg)   BMI 36.15 kg/m  Physical Exam  Constitutional: She is oriented to person, place, and time. She appears well-developed and well-nourished. She appears distressed (Mildly ill appearing. Nontoxic).  She actually looks somewhat peaked. Lying down on the exam table. Exhausted appearing.  HENT:  Head: Normocephalic and atraumatic.  Eyes: Pupils are equal, round, and reactive to light. EOM are normal.  Neck: Normal range of motion. Neck supple. No hepatojugular reflux and no JVD present. Carotid bruit is not present.  Cardiovascular: Normal rate, regular rhythm and intact distal pulses.   Occasional extrasystoles are present. PMI is not displaced.  Exam reveals no gallop and no friction rub.   Murmur heard.  Medium-pitched crescendo-decrescendo early systolic murmur is present with a grade of 1/6  at the upper right sternal border radiating to the neck Pulmonary/Chest: Effort normal and breath sounds normal. No respiratory distress. She has no wheezes. She has no rales. She exhibits tenderness.  Abdominal: Soft. Bowel sounds are normal. She exhibits no distension. There is no tenderness. There is no rebound and no guarding.  Musculoskeletal: Normal range of motion. 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 She exhibits no edema (not really  edema. Just mild puffiness to the skin).    Neurological: She is alert and oriented to person, place, and time.  Skin: Skin is warm and dry. No erythema. There is pallor (Cool, mildly clammy).  Psychiatric: She has a normal mood and affect. Her behavior is normal. Judgment and thought content normal.  anxious  Nursing note and vitals reviewed.   Adult ECG Report  none  Other studies Reviewed: Additional studies/ records that were reviewed today include:  Recent Labs:   Started on the [employer].  Lab Results  Component Value Date   CREATININE 0.61 09/19/2017   BUN 10 09/19/2017   NA 139 09/19/2017   K 3.5 09/19/2017   CL 105 09/19/2017   CO2 27 09/19/2017   Lab Results  Component Value Date   WBC 6.2 09/19/2017   HGB 12.2 09/19/2017   HCT 38.2 09/19/2017   MCV 87.8 09/19/2017   PLT 177 09/19/2017    ASSESSMENT / PLAN: I suspect most of Vickie [name]/ issues today are related to her UTI and hypotension from being on blood pressure medications with poor by mouth intake. I stressed the importance of hydration and actually eating anything -- especially soup if not wanting to eat solids.  Will need to reassess her Cardiac symptoms when she is feeling better & recovered from her UTI.  Problem List Items Addressed This Visit    CAD S/P percutaneous coronary angioplasty - Primary (Chronic)    Status post FFR guided PCI of Diag 2 with DES.  Initially her symptoms and to be well controlled.   At this point his heart rate tell how she is doing because she is now doing with the UTI and hypotension. He is on aspirin and Plavix and did have a little nosebleed. Recommended drops for her eyes. She is on felodipine for anginal effect as well as labetalol.  She also takes Pravachol.      Relevant Orders   EKG 12-Lead   Coronary artery disease involving native coronary artery of native heart with angina pectoris (HCC) (Chronic)    Underwent cath, Images confirms CTA with CT FFR findings. Only mild disease elsewhere.  Diagonal  lesion was treated with DES stent.      Relevant Orders   EKG 12-Lead   Essential hypertension (Chronic)    Has been well controlled on a combination of medications. -- would consider converting from Labetalol to a different option such as Carvedilol - better outcomes data. On ACE-I & Calclim channel blocker both at low doses.  Currently complaining of fatigue & dealing with a UTI -- hold felodipine until 1 month f/u      Hyperlipidemia LDL goal <70 (Chronic)    She is on the [place] trial. Labs are being followed by the study coronaries. She is also taking pravastatin.      Hypotension    Her blood pressure on initial recording by CMA today is quite low. On my recheck it is 110/60 mmHg the right arm and 120/60 mmHg on the left. -- I will have her hold felodipine until f/u appt. Hydrate.   Reassess in 1 month with APP.        Current medicines are reviewed at length with the patient today. (+/- concerns) still having chest pain The following changes have been made: if she takes sublingual nitroglycerin, she should take Imdur twice a day for 3 days after  Patient Instructions  DO NOT TAKE THE FELODIPINE FOR NOW UNTIL NEXT OFFICE APPOINTMENT.  KEEP HYDRATED - [place] EAT    Your physician recommends that you schedule a follow-up appointment in 1 MONTH WITH EXTENDER- B. STRADER PA  f/u with me 2-3 months after APP.  Studies Ordered:   Orders Placed This Encounter  Procedures  . EKG 12-Lead      Glenetta Hew, M.D., M.S. Interventional Cardiologist   Pager # [phone] Phone # [phone] [address] [place], [place] [address]

## 2017-10-02 ENCOUNTER — Encounter: Payer: Self-pay | Admitting: Cardiology

## 2017-10-02 DIAGNOSIS — I959 Hypotension, unspecified: Secondary | ICD-10-CM | POA: Insufficient documentation

## 2017-10-02 NOTE — Assessment & Plan Note (Signed)
She is on the [place] trial. Labs are being followed by the study coronaries. She is also taking pravastatin.

## 2017-10-02 NOTE — Telephone Encounter (Signed)
Patient had appointment 09/29/17, issues addressed

## 2017-10-02 NOTE — Assessment & Plan Note (Addendum)
Status post FFR guided PCI of Diag 2 with DES.  Initially her symptoms and to be well controlled.   At this point his heart rate tell how she is doing because she is now doing with the UTI and hypotension. He is on aspirin and Plavix and did have a little nosebleed. Recommended drops for her eyes. She is on felodipine for anginal effect as well as labetalol.  She also takes Pravachol.

## 2017-10-02 NOTE — Assessment & Plan Note (Signed)
Has been well controlled on a combination of medications. -- would consider converting from Labetalol to a different option such as Carvedilol - better outcomes data. On ACE-I & Calclim channel blocker both at low doses.  Currently complaining of fatigue & dealing with a UTI -- hold felodipine until 1 month f/u

## 2017-10-02 NOTE — Assessment & Plan Note (Signed)
Underwent cath, Images confirms CTA with CT FFR findings. Only mild disease elsewhere.  Diagonal lesion was treated with DES stent.

## 2017-10-02 NOTE — Assessment & Plan Note (Signed)
Her blood pressure on initial recording by CMA today is quite low. On my recheck it is 110/60 mmHg the right arm and 120/60 mmHg on the left. -- I will have her hold felodipine until f/u appt. Hydrate.   Reassess in 1 month with APP.

## 2017-10-04 ENCOUNTER — Emergency Department (HOSPITAL_COMMUNITY): Payer: Medicare Other

## 2017-10-04 ENCOUNTER — Observation Stay (HOSPITAL_COMMUNITY)
Admission: EM | Admit: 2017-10-04 | Discharge: 2017-10-05 | Disposition: A | Discharge status: Home / Self Care | Payer: Medicare Other | Attending: Cardiovascular Disease | Admitting: Cardiovascular Disease

## 2017-10-04 ENCOUNTER — Encounter (HOSPITAL_COMMUNITY): Payer: Self-pay

## 2017-10-04 DIAGNOSIS — Z79899 Other long term (current) drug therapy: Secondary | ICD-10-CM | POA: Diagnosis not present

## 2017-10-04 DIAGNOSIS — J449 Chronic obstructive pulmonary disease, unspecified: Secondary | ICD-10-CM | POA: Diagnosis not present

## 2017-10-04 DIAGNOSIS — R079 Chest pain, unspecified: Secondary | ICD-10-CM | POA: Diagnosis present

## 2017-10-04 DIAGNOSIS — Z87891 Personal history of nicotine dependence: Secondary | ICD-10-CM | POA: Diagnosis not present

## 2017-10-04 DIAGNOSIS — Z7982 Long term (current) use of aspirin: Secondary | ICD-10-CM | POA: Diagnosis not present

## 2017-10-04 DIAGNOSIS — E876 Hypokalemia: Secondary | ICD-10-CM | POA: Diagnosis not present

## 2017-10-04 DIAGNOSIS — E785 Hyperlipidemia, unspecified: Secondary | ICD-10-CM | POA: Diagnosis present

## 2017-10-04 DIAGNOSIS — I25119 Atherosclerotic heart disease of native coronary artery with unspecified angina pectoris: Secondary | ICD-10-CM | POA: Diagnosis present

## 2017-10-04 DIAGNOSIS — I10 Essential (primary) hypertension: Secondary | ICD-10-CM | POA: Diagnosis present

## 2017-10-04 DIAGNOSIS — Z955 Presence of coronary angioplasty implant and graft: Secondary | ICD-10-CM | POA: Insufficient documentation

## 2017-10-04 DIAGNOSIS — I2510 Atherosclerotic heart disease of native coronary artery without angina pectoris: Secondary | ICD-10-CM | POA: Diagnosis not present

## 2017-10-04 DIAGNOSIS — R600 Localized edema: Secondary | ICD-10-CM | POA: Diagnosis present

## 2017-10-04 DIAGNOSIS — E039 Hypothyroidism, unspecified: Secondary | ICD-10-CM | POA: Diagnosis present

## 2017-10-04 DIAGNOSIS — R072 Precordial pain: Principal | ICD-10-CM | POA: Insufficient documentation

## 2017-10-04 DIAGNOSIS — E119 Type 2 diabetes mellitus without complications: Secondary | ICD-10-CM | POA: Diagnosis not present

## 2017-10-04 DIAGNOSIS — E669 Obesity, unspecified: Secondary | ICD-10-CM | POA: Diagnosis present

## 2017-10-04 DIAGNOSIS — F341 Dysthymic disorder: Secondary | ICD-10-CM | POA: Diagnosis present

## 2017-10-04 DIAGNOSIS — R0789 Other chest pain: Secondary | ICD-10-CM | POA: Diagnosis not present

## 2017-10-04 DIAGNOSIS — I739 Peripheral vascular disease, unspecified: Secondary | ICD-10-CM | POA: Diagnosis present

## 2017-10-04 DIAGNOSIS — K219 Gastro-esophageal reflux disease without esophagitis: Secondary | ICD-10-CM | POA: Diagnosis present

## 2017-10-04 DIAGNOSIS — Z9861 Coronary angioplasty status: Secondary | ICD-10-CM

## 2017-10-04 LAB — URINALYSIS, ROUTINE W REFLEX MICROSCOPIC
Bilirubin Urine: NEGATIVE
Glucose, UA: NEGATIVE mg/dL
HGB URINE DIPSTICK: NEGATIVE
KETONES UR: NEGATIVE mg/dL
Nitrite: POSITIVE — AB
PROTEIN: NEGATIVE mg/dL
SQUAMOUS EPITHELIAL / LPF: NONE SEEN
Specific Gravity, Urine: 1.004 — ABNORMAL LOW (ref 1.005–1.030)
pH: 7 (ref 5.0–8.0)

## 2017-10-04 LAB — COMPREHENSIVE METABOLIC PANEL
ALK PHOS: 74 U/L (ref 38–126)
ALT: 19 U/L (ref 14–54)
AST: 27 U/L (ref 15–41)
Albumin: 3.3 g/dL — ABNORMAL LOW (ref 3.5–5.0)
Anion gap: 9 (ref 5–15)
BILIRUBIN TOTAL: 0.4 mg/dL (ref 0.3–1.2)
BUN: 9 mg/dL (ref 6–20)
CALCIUM: 9.3 mg/dL (ref 8.9–10.3)
CO2: 28 mmol/L (ref 22–32)
CREATININE: 0.71 mg/dL (ref 0.44–1.00)
Chloride: 103 mmol/L (ref 101–111)
Glucose, Bld: 96 mg/dL (ref 65–99)
Potassium: 3.3 mmol/L — ABNORMAL LOW (ref 3.5–5.1)
Sodium: 140 mmol/L (ref 135–145)
Total Protein: 6.3 g/dL — ABNORMAL LOW (ref 6.5–8.1)

## 2017-10-04 LAB — CBC WITH DIFFERENTIAL/PLATELET
Basophils Absolute: 0 10*3/uL (ref 0.0–0.1)
Basophils Relative: 0 %
EOS ABS: 0.1 10*3/uL (ref 0.0–0.7)
Eosinophils Relative: 2 %
HEMATOCRIT: 36.6 % (ref 36.0–46.0)
HEMOGLOBIN: 11.8 g/dL — AB (ref 12.0–15.0)
LYMPHS ABS: 1.7 10*3/uL (ref 0.7–4.0)
Lymphocytes Relative: 23 %
MCH: 27.9 pg (ref 26.0–34.0)
MCHC: 32.2 g/dL (ref 30.0–36.0)
MCV: 86.5 fL (ref 78.0–100.0)
MONO ABS: 0.4 10*3/uL (ref 0.1–1.0)
MONOS PCT: 6 %
Neutro Abs: 5.2 10*3/uL (ref 1.7–7.7)
Neutrophils Relative %: 69 %
Platelets: 391 10*3/uL (ref 150–400)
RBC: 4.23 MIL/uL (ref 3.87–5.11)
RDW: 13.1 % (ref 11.5–15.5)
WBC: 7.4 10*3/uL (ref 4.0–10.5)

## 2017-10-04 LAB — PROTIME-INR
INR: 0.92
PROTHROMBIN TIME: 12.3 s (ref 11.4–15.2)

## 2017-10-04 LAB — GLUCOSE, CAPILLARY: GLUCOSE-CAPILLARY: 168 mg/dL — AB (ref 65–99)

## 2017-10-04 LAB — MAGNESIUM: Magnesium: 1.7 mg/dL (ref 1.7–2.4)

## 2017-10-04 LAB — TROPONIN I: Troponin I: <0.03 ng/mL (ref <0.03)

## 2017-10-04 LAB — CBG MONITORING, ED: Glucose-Capillary: 85 mg/dL (ref 65–99)

## 2017-10-04 LAB — APTT: APTT: 25 s (ref 24–36)

## 2017-10-04 MED ORDER — ALPRAZOLAM 0.25 MG PO TABS
0.2500 mg | ORAL_TABLET | Freq: Two times a day (BID) | ORAL | Status: DC
  Administered 2017-10-04 – 2017-10-05 (×2): 0.25 mg via ORAL
  Filled 2017-10-04 (×2): qty 1

## 2017-10-04 MED ORDER — BUSPIRONE HCL 5 MG PO TABS
15.0000 mg | ORAL_TABLET | Freq: Two times a day (BID) | ORAL | Status: DC
  Administered 2017-10-04 – 2017-10-05 (×2): 15 mg via ORAL
  Filled 2017-10-04 (×2): qty 1

## 2017-10-04 MED ORDER — PANTOPRAZOLE SODIUM 40 MG PO TBEC
40.0000 mg | DELAYED_RELEASE_TABLET | Freq: Every day | ORAL | Status: DC
  Administered 2017-10-04: 40 mg via ORAL
  Filled 2017-10-04: qty 1

## 2017-10-04 MED ORDER — HEPARIN SODIUM (PORCINE) 5000 UNIT/ML IJ SOLN: 5000.0000 [IU] | Freq: Three times a day (TID) | INTRAMUSCULAR | Status: DC

## 2017-10-04 MED ORDER — HEPARIN BOLUS VIA INFUSION
3700.0000 [IU] | Freq: Once | INTRAVENOUS | Status: AC
  Administered 2017-10-04: 3700 [IU] via INTRAVENOUS
  Filled 2017-10-04: qty 3700

## 2017-10-04 MED ORDER — HEPARIN (PORCINE) IN NACL 100-0.45 UNIT/ML-% IJ SOLN
1000.0000 [IU]/h | INTRAMUSCULAR | Status: DC
  Administered 2017-10-04: 750 [IU]/h via INTRAVENOUS
  Filled 2017-10-04: qty 250

## 2017-10-04 MED ORDER — MIRTAZAPINE 7.5 MG PO TABS
7.5000 mg | ORAL_TABLET | Freq: Every day | ORAL | Status: DC
  Administered 2017-10-04: 7.5 mg via ORAL
  Filled 2017-10-04: qty 1

## 2017-10-04 MED ORDER — ENALAPRIL MALEATE 2.5 MG PO TABS
2.5000 mg | ORAL_TABLET | Freq: Every evening | ORAL | Status: DC
  Filled 2017-10-04: qty 1

## 2017-10-04 MED ORDER — ONDANSETRON HCL 4 MG/2ML IJ SOLN: 4.0000 mg | Freq: Four times a day (QID) | INTRAMUSCULAR | Status: DC | PRN

## 2017-10-04 MED ORDER — DEXTROSE 5 % IV SOLN
1.0000 g | INTRAVENOUS | Status: DC
  Administered 2017-10-04: 1 g via INTRAVENOUS
  Filled 2017-10-04 (×2): qty 10

## 2017-10-04 MED ORDER — ISOSORBIDE MONONITRATE ER 60 MG PO TB24
60.0000 mg | ORAL_TABLET | Freq: Every day | ORAL | Status: DC
  Administered 2017-10-05: 60 mg via ORAL
  Filled 2017-10-04: qty 1

## 2017-10-04 MED ORDER — ASPIRIN 81 MG PO CHEW: 324.0000 mg | CHEWABLE_TABLET | ORAL | Status: DC

## 2017-10-04 MED ORDER — ASPIRIN 81 MG PO CHEW
324.0000 mg | CHEWABLE_TABLET | Freq: Once | ORAL | Status: AC
  Administered 2017-10-04: 324 mg via ORAL
  Filled 2017-10-04: qty 4

## 2017-10-04 MED ORDER — PRAVASTATIN SODIUM 40 MG PO TABS
40.0000 mg | ORAL_TABLET | Freq: Every evening | ORAL | Status: DC
  Administered 2017-10-04: 40 mg via ORAL
  Filled 2017-10-04: qty 1

## 2017-10-04 MED ORDER — ACETAMINOPHEN 325 MG PO TABS: 650.0000 mg | ORAL_TABLET | ORAL | Status: DC | PRN

## 2017-10-04 MED ORDER — NITROGLYCERIN 0.4 MG SL SUBL: 0.4000 mg | SUBLINGUAL_TABLET | SUBLINGUAL | Status: DC | PRN

## 2017-10-04 MED ORDER — PANTOPRAZOLE SODIUM 40 MG PO TBEC: 40.0000 mg | DELAYED_RELEASE_TABLET | Freq: Every day | ORAL | Status: DC

## 2017-10-04 MED ORDER — ASPIRIN 300 MG RE SUPP: 300.0000 mg | RECTAL | Status: DC

## 2017-10-04 MED ORDER — PRAVASTATIN SODIUM 40 MG PO TABS: 40.0000 mg | ORAL_TABLET | Freq: Every day | ORAL | Status: DC

## 2017-10-04 MED ORDER — CLOPIDOGREL BISULFATE 75 MG PO TABS
75.0000 mg | ORAL_TABLET | Freq: Every day | ORAL | Status: DC
  Administered 2017-10-05: 75 mg via ORAL
  Filled 2017-10-04: qty 1

## 2017-10-04 MED ORDER — ASPIRIN EC 81 MG PO TBEC
81.0000 mg | DELAYED_RELEASE_TABLET | Freq: Every day | ORAL | Status: DC
  Administered 2017-10-05: 81 mg via ORAL
  Filled 2017-10-04: qty 1

## 2017-10-04 NOTE — ED Provider Notes (Signed)
Vickie [name] DEPT Provider Note   CSN: [id number] Arrival date & time: 10/04/17  1435     History   Chief Complaint Chief Complaint  Patient presents with  . Chest Pain    HPI Vickie [name] is a 71 y.o. female.  HPI  71 y/o female - has hx of CP - recently found by cath to have obstructive disease, s/p stenting ~2 weeks ago - presents today with chest pain that occurred while the patient hasn't been at [place] shopping on a motorized cart. He developed increased amounts of pain in the middle of the chest, states this felt similar to prior pains, had radiation to jaws, mild shortness of breath nausea or diaphoresis. The patient has never had prior cardiac disease and pelvis was found in the last month. She has been taking her Plavix and her daily baby aspirin. While in the [place] she had nitroglycerin, this did not help her symptoms, and a second nitroglycerin 5 minutes later, this also did not help. She was given a third nitroglycerin and paramedics eventually transported the patient to the hospital. The patientthat she has ongoing mild pain which has improved significantly but is still there. She denies any worsening of swelling of the legs, there is no headaches, fevers, vomiting or diarrhea. She does report recently being treated for a urinary tract infection. She has finished her antibiotics here in the next day or 2 and has improved.  PCP Dr. Sharilyn [name] Cardiologist: Dr. Ellyn Hack  Past Medical History:  Diagnosis Date  . Anginal pain ([place])   . Anxiety   . Anxiety disorder    With apparent panic attacks  . Arthritis    "hands, knees, back" (09/18/2017)  . CAD S/P percutaneous coronary angioplasty 08/2017   Single-vessel CAD involving second diagonal branch treated with DES stent Synergy 2.25 mm x 12 mm (2.4 mm)  . Chronic back pain    "all over" (09/18/2017)  . COPD (chronic obstructive pulmonary disease) ([place])    PFTs were done in 2008 at [facility]  . Depression     . Dyspnea   . Essential hypertension   . GERD (gastroesophageal reflux disease)   . Hiatal hernia   . History of left bundle branch block (LBBB) 08/2017   Rate Related LBBB noted in cath lab  . Hyperlipidemia   . Macular degeneration    right eye  . Myocardial infarction [facility]) 2007   "medically induced"  . Nonocclusive coronary atherosclerosis of native coronary artery 2006 through 2012   multi caths 2012, 2006, [id number] (8938 complicated by catheter-induced dissection of small nondominant RCA, patent in 2009 with no residual abnormality); Myoview August 2013: LOW RISK, normal EF. ; Echocardiogram [name] - January 2014) moderate LVH, EF 55-65%. No significant valvular disease.  . OSA (obstructive sleep apnea) 9/25/ 2012   tested 2009; tetested sleep study 07/2011--titration 09/20/2011 now use Bi-PAP  . OSA on CPAP   . Pneumonia    "several times in 2017; 3 times already this year" (09/18/2017)  . Scoliosis   . Type II diabetes mellitus [facility])     Patient Active Problem List   Diagnosis Date Noted  . Hypotension 10/02/2017  . Status post coronary artery stent placement   . CAD S/P percutaneous coronary angioplasty 09/18/2017  . Abnormal cardiac CT angiography 09/05/2017  . Pre-op evaluation 09/05/2017  . Special screening for malignant neoplasms, colon 07/31/2017  . Elevated CK 07/30/2017  . DOE (dyspnea on exertion) 07/28/2017  . Bilateral  lower extremity edema 09/10/2016  . Accelerated hypertension 09/10/2015  . Angina, class III ([place]) 06/18/2015  . Claudication ([place]) 06/18/2015  . Obesity (BMI 30-39.9) 04/19/2014  . Coronary artery disease involving native coronary artery of native heart with angina pectoris ([place])   . Hyperlipidemia LDL goal <70   . Dysphagia, unspecified(787.20) 02/17/2014  . Hypothyroidism 04/19/2008  . DIABETES MELLITUS 04/19/2008  . ANXIETY DEPRESSION 04/19/2008  . Essential hypertension 04/19/2008  . GASTROESOPHAGEAL REFLUX DISEASE 04/19/2008   . ISCHEMIC COLITIS 04/19/2008  . RECTAL BLEEDING 04/19/2008  . ARTHRITIS 04/19/2008    Past Surgical History:  Procedure Laterality Date  . CARDIAC CATHETERIZATION  [id number]; 2009   Non-occlusice CAD - only 80% ostial SP1;  NON DOMINANNT  RCA (catheter insuced dissection with MI in 2007 --> resolved by 2009 cath)  . CATARACT EXTRACTION, BILATERAL Bilateral 2017   Toric lens "in the left eye only"  . CORONARY STENT INTERVENTION N/A 09/18/2017   Procedure: CORONARY STENT INTERVENTION;  Surgeon: Leonie Man, MD;  Location: [facility] INVASIVE CV LAB: DES PCI Diag2: Synergy DES 2.25 mm x 12 mm (2.4 mm)  . DIAGNOSTIC LAPAROSCOPY Right   . DILATION AND CURETTAGE OF UTERUS    . EYE SURGERY    . INTRAVASCULAR PRESSURE WIRE/FFR STUDY N/A 09/18/2017   Procedure: INTRAVASCULAR PRESSURE WIRE/FFR STUDY;  Surgeon: Leonie Man, MD;  Location: [place] CV LAB;  Service: Cardiovascular: FFR Diag 2 -- 0.78 (significcant) --> PCI  . KNEE ARTHROSCOPY Right   . LEFT HEART CATH AND CORONARY ANGIOGRAPHY N/A 09/18/2017   Procedure: LEFT HEART CATH AND CORONARY ANGIOGRAPHY;  Surgeon: Leonie Man, MD;  Location: [facility] INVASIVE CV LAB: Culpril - 80% Diag2 (FFR 0.78)- PCI.  pLAD 40%, pCx 40%. Post PCI LBBB. LVEDP - ~20 mmHg. EF 55-60%  . LIPOMA EXCISION Right ~ 2015   anterior abdomen  . PARS PLANA VITRECTOMY W/ REPAIR OF MACULAR HOLE Right    Unsuccessful repair.  Hole filled  . VAGINAL HYSTERECTOMY      OB History    No data available       Home Medications    Prior to Admission medications   Medication Sig Start Date End Date Taking? Authorizing Provider  albuterol (PROAIR HFA) 108 (90 BASE) MCG/ACT inhaler Inhale 1 puff into the lungs every 6 (six) hours as needed for wheezing or shortness of breath.    [provider]  albuterol (PROVENTIL) (2.5 MG/3ML) 0.083% nebulizer solution Take 2.5 mg by nebulization every 6 (six) hours as needed for wheezing or shortness of breath.      [provider]  ALPRAZolam Duanne Moron) 0.5 MG tablet Take 0.25 mg by mouth 2 (two) times daily.     [provider]  AMBULATORY NON FORMULARY MEDICATION Inject 300 mg into the skin as directed. Medication Name:Inclisiran sodium 300 mg vs placebo 02/22/17   Hillary Bow, MD  aspirin EC 81 MG tablet Take 1 tablet (81 mg total) by mouth daily. 09/19/17   Cheryln Manly, NP  Biotin w/ Vitamins C & E (HAIR/SKIN/NAILS PO) Take 1 tablet by mouth at bedtime.    [provider]  busPIRone (BUSPAR) 15 MG tablet Take 15 mg by mouth 2 (two) times daily.    [provider]  Calcium Carb-Cholecalciferol (CALCIUM 600 + D PO) Take 1 tablet by mouth 2 (two) times daily.    [provider]  CINNAMON PO Take 1 capsule by mouth 2 (two) times daily.  [provider]  clopidogrel (PLAVIX) 75 MG tablet Take 1 tablet (75 mg total) by mouth daily with breakfast. 09/19/17   Reino Bellis B, NP  Coenzyme Q10 (COQ10) 100 MG CAPS Take 100 mg by mouth every morning.    [provider]  CRANBERRY PO Take 1 capsule by mouth 2 (two) times daily.     [provider]  doxycycline (VIBRAMYCIN) 100 MG capsule Take 100 mg by mouth 2 (two) times daily.    [provider]  enalapril (VASOTEC) 2.5 MG tablet Take 2.5 mg by mouth every evening.     [provider]  escitalopram (LEXAPRO) 20 MG tablet Take 20 mg by mouth every morning.     [provider]  felodipine (PLENDIL) 2.5 MG 24 hr tablet TAKE ONE TABLET BY MOUTH ONCE DAILY. Patient taking differently: TAKE ONE TABLET BY MOUTH ONCE DAILY AT BEDTIME 05/02/17   Leonie Man, MD  furosemide (LASIX) 20 MG tablet Take 20 mg by mouth as directed.    [provider]  isosorbide mononitrate (IMDUR) 60 MG 24 hr tablet TAKE ONE TABLET BY MOUTH ONCE DAILY. 09/13/17   Leonie Man, MD  labetalol (NORMODYNE) 200 MG tablet TAKE ONE TABLET BY MOUTH AS NEEDED FOR BLOOD PRESSURE IF  TOP NUMBER IS GREATER THAN 180. 09/20/17   Leonie Man, MD  mirtazapine (REMERON) 15 MG tablet Take 7.5 mg by mouth at bedtime.     [provider]  Multiple Vitamin (MULTIVITAMIN) tablet Take 1 tablet by mouth daily.    [provider]  nitroGLYCERIN (NITROSTAT) 0.4 MG SL tablet Place 1 tablet (0.4 mg total) under the tongue every 5 (five) minutes as needed for chest pain. Max 3 doses. 10/05/16   Erlene Quan, PA-C  Omega-3 Fatty Acids (FISH OIL PO) Take 1 capsule by mouth at bedtime.    [provider]  pantoprazole (PROTONIX) 40 MG tablet TAKE ONE TABLET BY MOUTH ONCE DAILY. 09/27/17   Leonie Man, MD  pravastatin (PRAVACHOL) 40 MG tablet Take 1 tablet (40 mg total) by mouth daily. Patient taking differently: Take 40 mg by mouth at bedtime.  11/03/16   Leonie Man, MD  Tiotropium Bromide Monohydrate (SPIRIVA RESPIMAT) 2.5 MCG/ACT AERS Inhale 2.5 mcg into the lungs 2 (two) times daily.    [provider]    Family History Family History  Problem Relation Age of Onset  . CAD Mother   . Breast cancer Maternal Aunt   . Breast cancer Maternal Aunt     Social History Social History  Substance Use Topics  . Smoking status: Former Smoker    Packs/day: 3.00    Years: 8.50    Types: Cigarettes    Quit date: 04/18/1977  . Smokeless tobacco: Never Used  . Alcohol use No     Allergies   Adhesive [tape]; Avelox [moxifloxacin]; Bactrim [sulfamethoxazole-trimethoprim]; Levaquin [levofloxacin]; Mucinex [guaifenesin er]; Penicillins; Sudafed [pseudoephedrine hcl]; Crestor [rosuvastatin]; Lipitor [atorvastatin]; Vytorin [ezetimibe-simvastatin]; and Norvasc [amlodipine]   Review of Systems Review of Systems  All other systems reviewed and are negative.    Physical Exam Updated Vital Signs BP 134/63 (BP Location: Right Arm)   Pulse 74   Temp 98.3 F (36.8 C) (Oral)   Resp 20   Ht 4\' 11"  (1.499 m)   Wt 81.2 kg (179 lb)   SpO2 95%   BMI  36.15 kg/m   Physical Exam  Constitutional: She appears well-developed and well-nourished. No distress.  HENT:  Head: Normocephalic and atraumatic.  Mouth/Throat: Oropharynx is clear and moist. No oropharyngeal exudate.  Eyes: Pupils are equal, round, and reactive to light. Conjunctivae and EOM are normal. Right eye exhibits no discharge. Left eye exhibits no discharge. No scleral icterus.  Neck: Normal range of motion. Neck supple. No JVD present. No thyromegaly present.  Cardiovascular: Normal rate, regular rhythm, normal heart sounds and intact distal pulses.  Exam reveals no gallop and no friction rub.   No murmur heard. Pulmonary/Chest: Effort normal and breath sounds normal. No respiratory distress. She has no wheezes. She has no rales.  Abdominal: Soft. Bowel sounds are normal. She exhibits no distension and no mass. There is no tenderness.  Musculoskeletal: Normal range of motion. She exhibits no edema or tenderness.  Lymphadenopathy:    She has no cervical adenopathy.  Neurological: She is alert. Coordination normal.  Skin: Skin is warm and dry. No rash noted. No erythema.  Psychiatric: She has a normal mood and affect. Her behavior is normal.  Nursing note and vitals reviewed.    ED Treatments / Results  Labs (all labs ordered are listed, but only abnormal results are displayed) Labs Reviewed  COMPREHENSIVE METABOLIC PANEL  APTT  PROTIME-INR  CBC WITH DIFFERENTIAL/PLATELET  URINALYSIS, ROUTINE W REFLEX MICROSCOPIC  URINALYSIS, COMPLETE (UACMP) WITH MICROSCOPIC  TROPONIN I  CBG MONITORING, ED    EKG  EKG Interpretation  Date/Time:  Wednesday October 04 2017 14:38:34 EDT Ventricular Rate:  71 PR Interval:  188 QRS Duration: 92 QT Interval:  442 QTC Calculation: 480 R Axis:   4 Text Interpretation:  Normal sinus rhythm Septal infarct , age undetermined Abnormal ECG since last tracing no significant change Confirmed by Noemi Chapel [id number]) on 10/04/2017 2:55:11  PM       Radiology No results found.  Procedures .Critical Care Performed by: Noemi Chapel Authorized by: Noemi Chapel   Critical care provider statement:    Critical care time (minutes):  35   Critical care time was exclusive of:  Separately billable procedures and treating other patients and teaching time   Critical care was necessary to treat or prevent imminent or life-threatening deterioration of the following conditions:  Cardiac failure   Critical care was time spent personally by me on the following activities:  Development of treatment plan with patient or surrogate, discussions with consultants, evaluation of patient's response to treatment, examination of patient, obtaining history from patient or surrogate, review of old charts, re-evaluation of patient's condition, pulse oximetry, ordering and review of laboratory studies, ordering and review of radiographic studies and ordering and performing treatments and interventions   (including critical care time)  Medications Ordered in ED Medications  aspirin chewable tablet 324 mg (not administered)  Heparin drip started   Initial Impression / Assessment and Plan / ED Course  I have reviewed the triage vital signs and the nursing notes.  Pertinent labs & imaging results that were available during my care of the patient were reviewed by me and considered in my medical decision making (see chart for details).    Exam is unremarkable - the pt still appears to be uncomfortable but is not diaphoretic.  VS reviewed without tachycardia, hypotension or hypoxia.  Discussed the case with Dr. Gwenlyn Found who will see the patient in consultation regarding possible admission or further evaluation. Troponin pending, discussion with consult at 5:10 PM  Pharmacy consult for heparin dosing Heparin drip started  Urinalysis shows ongoing UTI, multiple allergies, pharmacy consulted for antibiotic  use  Vitals:   10/04/17 1438 10/04/17 1445    BP:  134/63  Pulse:  74  Resp:  20  Temp:  98.3 F (36.8 C)  TempSrc:  Oral  SpO2:  95%  Weight: 81.2 kg (179 lb)   Height: 4\' 11"  (1.499 m)      Final Clinical Impressions(s) / ED Diagnoses   Final diagnoses:  Precordial chest pain    New Prescriptions New Prescriptions   No medications on file     Noemi Chapel, MD 10/04/17 2351

## 2017-10-04 NOTE — Progress Notes (Signed)
Pharmacy Antibiotic Note  Vickie [name] is a 71 y.o. female admitted on 10/04/2017 with UTI.  Pharmacy has been consulted for antibiotic dosing/recommendation. On doxycycline PTA. Afebrile, WBC normal. Urinalysis: many bacteria, large leukocytes, positive nitrite. Will take patient allergies into account when selecting agent. Spoke with provider about penicillin allergy "passing out."  Provider ok with challenging allergy with cephalosporin given limited options.   Plan: Ceftriaxone 1g IV every 24 hours.  Monitor for Scr, c/s, clinical resolution. F/u de-escalation plan/LOT.    Height: 4\' 11"  (149.9 cm) Weight: 179 lb (81.2 kg) IBW/kg (Calculated) : 43.2  Temp (24hrs), Avg:98.3 F (36.8 C), Min:98.3 F (36.8 C), Max:98.3 F (36.8 C)   Recent Labs Lab 10/04/17 1619  WBC 7.4  CREATININE 0.71    Estimated Creatinine Clearance: 60.3 mL/min (by C-G formula based on SCr of 0.71 mg/dL).    Allergies  Allergen Reactions  . Adhesive [Tape] Other (See Comments)    REACTION: redness/irritation at application site. **Certain bandages/adhesives cause this reaction**  . Avelox [Moxifloxacin] Anaphylaxis  . Bactrim [Sulfamethoxazole-Trimethoprim] Shortness Of Breath and Rash    REACTION: Choking, inability to swallow, redness  . Levaquin [Levofloxacin] Shortness Of Breath, Rash and Other (See Comments)    Reaction:Choking Brand name Levaquin ok per pt  . Mucinex [Guaifenesin Er] Shortness Of Breath and Rash  . Penicillins Other (See Comments)    "Passed out" Has patient had a PCN reaction causing immediate rash, facial/tongue/throat swelling, SOB or lightheadedness with hypotension: Unknown Has patient had a PCN reaction causing severe rash involving mucus membranes or skin necrosis: No Has patient had a PCN reaction that required hospitalization: No Has patient had a PCN reaction occurring within the last 10 years: No If all of the above answers are "NO", then may proceed with  Cephalosporin use.   [place] [Pseudoephedrine Hcl] Shortness Of Breath, Rash and Other (See Comments)    REACTION: Choking, redness, inability to swallow  . Crestor [Rosuvastatin] Other (See Comments)    Leg pain  . Lipitor [Atorvastatin] Other (See Comments)    Leg pain  . Vytorin [Ezetimibe-Simvastatin] Other (See Comments)    Leg pain  . Norvasc [Amlodipine] Other (See Comments)    unknown    Antimicrobials this admission: ceftriaxone 10/10 >>    Thank you for allowing pharmacy to be a part of this patient's care.  Jerrye Noble, PharmD Candidate  10/04/2017 5:27 PM

## 2017-10-04 NOTE — ED Notes (Signed)
Pt tearful and upset about having to be admitted back in the hospital. This RN consoled pt and reassured her.

## 2017-10-04 NOTE — Progress Notes (Signed)
ANTICOAGULATION CONSULT NOTE - Initial Consult  Pharmacy Consult for heparin dosing Indication: chest pain/ACS  Allergies  Allergen Reactions  . Adhesive [Tape] Other (See Comments)    REACTION: redness/irritation at application site. **Certain bandages/adhesives cause this reaction**  . Avelox [Moxifloxacin] Anaphylaxis  . Bactrim [Sulfamethoxazole-Trimethoprim] Shortness Of Breath and Rash    REACTION: Choking, inability to swallow, redness  . Levaquin [Levofloxacin] Shortness Of Breath, Rash and Other (See Comments)    Reaction:Choking Brand name Levaquin ok per pt  . Mucinex [Guaifenesin Er] Shortness Of Breath and Rash  . Penicillins Other (See Comments)    "Passed out" Has patient had a PCN reaction causing immediate rash, facial/tongue/throat swelling, SOB or lightheadedness with hypotension: Unknown Has patient had a PCN reaction causing severe rash involving mucus membranes or skin necrosis: No Has patient had a PCN reaction that required hospitalization: No Has patient had a PCN reaction occurring within the last 10 years: No If all of the above answers are "NO", then may proceed with Cephalosporin use.   [place] [Pseudoephedrine Hcl] Shortness Of Breath, Rash and Other (See Comments)    REACTION: Choking, redness, inability to swallow  . Crestor [Rosuvastatin] Other (See Comments)    Leg pain  . Lipitor [Atorvastatin] Other (See Comments)    Leg pain  . Vytorin [Ezetimibe-Simvastatin] Other (See Comments)    Leg pain  . Norvasc [Amlodipine] Other (See Comments)    unknown    Patient Measurements: Height: 4\' 11"  (149.9 cm) Weight: 179 lb (81.2 kg) IBW/kg (Calculated) : 43.2 Heparin Dosing Weight: 62.2  Vital Signs: Temp: 98.3 F (36.8 C) (10/10 1445) Temp Source: Oral (10/10 1445) BP: 126/79 (10/10 1615) Pulse Rate: 70 (10/10 1615)  Labs:  Recent Labs  10/04/17 1619  HGB 11.8*  HCT 36.6  PLT 391    Estimated Creatinine Clearance: 60.3 mL/min (by  C-G formula based on SCr of 0.61 mg/dL).   Medical History: Past Medical History:  Diagnosis Date  . Anginal pain ([place])   . Anxiety   . Anxiety disorder    With apparent panic attacks  . Arthritis    "hands, knees, back" (09/18/2017)  . CAD S/P percutaneous coronary angioplasty 08/2017   Single-vessel CAD involving second diagonal branch treated with DES stent Synergy 2.25 mm x 12 mm (2.4 mm)  . Chronic back pain    "all over" (09/18/2017)  . COPD (chronic obstructive pulmonary disease) ([place])    P[facility] done in [facility] at [facility]  . Depression   . Dyspnea   . Essential hypertension   . GERD (gastroesophageal reflux disease)   . Hiatal hernia   . History of left bundle branch block (LBBB) 08/2017   Rate Related LBBB noted in cath lab  . Hyperlipidemia   . Macular degeneration    right eye  . Myocardial infarction [facility]) 2007   "medically induced"  . Nonocclusive coronary atherosclerosis of native coronary artery 2006 through 2012   multi caths 2012, 2006, [id number] (2703 complicated by catheter-induced dissection of small nondominant RCA, patent in 2009 with no residual abnormality); Myoview August 2013: LOW RISK, normal EF. ; Echocardiogram [name] - January 2014) moderate LVH, EF 55-65%. No significant valvular disease.  . OSA (obstructive sleep apnea) 9/25/ 2012   tested 2009; tetested sleep study 07/2011--titration 09/20/2011 now use Bi-PAP  . OSA on CPAP   . Pneumonia    "several times in 2017; 3 times already this year" (09/18/2017)  . Scoliosis   .  Type II diabetes mellitus ([place])      Assessment: 89 YOF admitted with mid center chest pressure. Pharmacy consulted for heparin dosing for ACS. PMH: nonobstructive atherosclerosis- hx of multiple caths, stent placement two weeks ago (9/24) to 2nd diag.  On DAPT with aspirin and plavix PTA.  Hgb 11.8, Hct 36.6, Plt 391. EKG Normal sinus rhythm with septal infarct. No reported bleeding, no anticoag PTA.   Goal of Therapy:   Heparin level 0.3-0.7 units/ml Monitor platelets by anticoagulation protocol: Yes   Plan:  Give 3700 units bolus x 1 Start heparin infusion at 750 units/hr  6 hour heparin level, daily heparin level and CBC. Monitor s/sx of bleeding  Jerrye Noble, PharmD Candidate 10/04/2017,5:03 PM

## 2017-10-04 NOTE — H&P (Signed)
Cardiology Admission History and Physical:   Patient ID: Vickie [name]; MRN: [id number]; DOB: [birth year]   Admission date: 10/04/2017  Primary Care Provider: Sharilyn Sites, MD Primary Cardiologist: Dr. Ellyn Hack    Chief Complaint:  Chest pain   Patient Profile:   Vickie [name] is a 71 y.o. female with a history of CAD s/p recent PCI + DES to Diag 09/18/17, presenting to ED with recurrent CP.   History of Present Illness:   71 yo female with PMH of CAD, HTN, HL, COPD, DM and GERD who was seen in the office 09/05/17 with Dr. Ellyn Hack for follow up. Noted to have chest pain back in 8/18 and underwent coronary CT with calcium score of 358 and moderate calcified stenosis of the left main and pLAD. Then sent for CT FFR that showed 0.8 diag branch and 0.88 in the mid Lcx. At office visit reported worsening symptoms and was referred for cardiac cath.    She presented for outpatient cath on 09/18/17 with Dr. Ellyn Hack and underwent successful PCI/DES x1 to the 2nd diag (80% stenosed with FFR at 0.78). There was mild 40% proximal LAD and 40% prox Cx, treated medically. Plan for DAPT with ASA/Plavix for at least 3 months, then can consider stopping ASA if needed. No BB 2/2 to hx of fatigue per office notes. Pt was discharged on 09/20/17. Of note, echo 08/03/17 showed normal LVEF at 60-65% and G1DD. Mild AI noted.   Pt also developed a UTI after discharge and was placed on antibiotics.   She now presents back to the ED with complaint of recurrent CP. Episode occurred at rest. She was out shopping at [facility] and was using [employer] cart when she suddenly developed crushing substernal chest tightness, radiating up to her anterior throat. Pain felt similar to angina prior to stent. She was also diaphoretic and short of breath. No relationship with meals. She took 3 SL NTG w/o relief. Her daughter called 911. She was given 3 baby ASA. On arrival to [facility], she was CP free. No recurrent pain. Initial troponin is  negative. K is low at 3.3. SCr normal. CBC unremarkable. EKG NSR w/o ischemic changes. VSS. CXR with questionable developing interstitial edema. No evidence of lobar pneumonia. No dyspnea currently.   She reports full med compliance with ASA and Plavix. No missed doses. UA shows unresolved UTI. She has also been compliant with antibiotics.    Past Medical History:  Diagnosis Date  . Anginal pain ([place])   . Anxiety   . Anxiety disorder    With apparent panic attacks  . Arthritis    "hands, knees, back" (09/18/2017)  . CAD S/P percutaneous coronary angioplasty 08/2017   Single-vessel CAD involving second diagonal branch treated with DES stent Synergy 2.25 mm x 12 mm (2.4 mm)  . Chronic back pain    "all over" (09/18/2017)  . COPD (chronic obstructive pulmonary disease) ([place])    PFTs were done in 2008 at [facility]  . Depression   . Dyspnea   . Essential hypertension   . GERD (gastroesophageal reflux disease)   . Hiatal hernia   . History of left bundle branch block (LBBB) 08/2017   Rate Related LBBB noted in cath lab  . Hyperlipidemia   . Macular degeneration    right eye  . Myocardial infarction [facility]) 2007   "medically induced"  . Nonocclusive coronary atherosclerosis of native coronary artery 2006 through 2012   multi caths 2012, 2006, [id number] (3662 complicated  by catheter-induced dissection of small nondominant RCA, patent in 2009 with no residual abnormality); Myoview August 2013: LOW RISK, normal EF. ; Echocardiogram [name] - January 2014) moderate LVH, EF 55-65%. No significant valvular disease.  . OSA (obstructive sleep apnea) 9/25/ 2012   tested 2009; tetested sleep study 07/2011--titration 09/20/2011 now use Bi-PAP  . OSA on CPAP   . Pneumonia    "several times in 2017; 3 times already this year" (09/18/2017)  . Scoliosis   . Type II diabetes mellitus ([place])     Past Surgical History:  Procedure Laterality Date  . CARDIAC CATHETERIZATION  1607,3710; 2009    Non-occlusice CAD - only 80% ostial SP1;  NON DOMINANNT  RCA (catheter insuced dissection with MI in 2007 --> resolved by 2009 cath)  . CATARACT EXTRACTION, BILATERAL Bilateral 2017   Toric lens "in the left eye only"  . CORONARY STENT INTERVENTION N/A 09/18/2017   Procedure: CORONARY STENT INTERVENTION;  Surgeon: Leonie Man, MD;  Location: [facility] INVASIVE CV LAB: DES PCI Diag2: Synergy DES 2.25 mm x 12 mm (2.4 mm)  . DIAGNOSTIC LAPAROSCOPY Right   . DILATION AND CURETTAGE OF UTERUS    . EYE SURGERY    . INTRAVASCULAR PRESSURE WIRE/FFR STUDY N/A 09/18/2017   Procedure: INTRAVASCULAR PRESSURE WIRE/FFR STUDY;  Surgeon: Leonie Man, MD;  Location: [place] CV LAB;  Service: Cardiovascular: FFR Diag 2 -- 0.78 (significcant) --> PCI  . KNEE ARTHROSCOPY Right   . LEFT HEART CATH AND CORONARY ANGIOGRAPHY N/A 09/18/2017   Procedure: LEFT HEART CATH AND CORONARY ANGIOGRAPHY;  Surgeon: Leonie Man, MD;  Location: [facility] INVASIVE CV LAB: Culpril - 80% Diag2 (FFR 0.78)- PCI.  pLAD 40%, pCx 40%. Post PCI LBBB. LVEDP - ~20 mmHg. EF 55-60%  . LIPOMA EXCISION Right ~ 2015   anterior abdomen  . PARS PLANA VITRECTOMY W/ REPAIR OF MACULAR HOLE Right    Unsuccessful repair.  Hole filled  . VAGINAL HYSTERECTOMY       Medications Prior to Admission: Prior to Admission medications   Medication Sig Start Date End Date Taking? Authorizing Provider  albuterol (PROAIR HFA) 108 (90 BASE) MCG/ACT inhaler Inhale 1 puff into the lungs every 6 (six) hours as needed for wheezing or shortness of breath.   Yes [provider]  albuterol (PROVENTIL) (2.5 MG/3ML) 0.083% nebulizer solution Take 2.5 mg by nebulization every 6 (six) hours as needed for wheezing or shortness of breath.    Yes [provider]  ALPRAZolam Duanne Moron) 0.5 MG tablet Take 0.25 mg by mouth 2 (two) times daily.    Yes [provider]  AMBULATORY NON FORMULARY MEDICATION Inject 300 mg into the skin as directed. Medication  Name:Inclisiran sodium 300 mg vs placebo 02/22/17  Yes Hillary Bow, MD  aspirin EC 81 MG tablet Take 1 tablet (81 mg total) by mouth daily. 09/19/17  Yes Cheryln Manly, NP  Biotin w/ Vitamins C & E (HAIR/SKIN/NAILS PO) Take 1 tablet by mouth at bedtime.   Yes [provider]  busPIRone (BUSPAR) 15 MG tablet Take 15 mg by mouth 2 (two) times daily.   Yes [provider]  Calcium Carb-Cholecalciferol (CALCIUM 600 + D PO) Take 1 tablet by mouth 2 (two) times daily.   Yes [provider]  CINNAMON PO Take 1 capsule by mouth 2 (two) times daily.   Yes [provider]  clopidogrel (PLAVIX) 75 MG tablet Take 1 tablet (75 mg total) by mouth daily with  breakfast. 09/19/17  Yes Reino Bellis B, NP  Coenzyme Q10 (COQ10) 100 MG CAPS Take 100 mg by mouth every morning.   Yes [provider]  CRANBERRY PO Take 1 capsule by mouth 2 (two) times daily.    Yes [provider]  doxycycline (VIBRAMYCIN) 100 MG capsule Take 100 mg by mouth 2 (two) times daily.   Yes [provider]  escitalopram (LEXAPRO) 20 MG tablet Take 20 mg by mouth every morning.    Yes [provider]  felodipine (PLENDIL) 2.5 MG 24 hr tablet TAKE ONE TABLET BY MOUTH ONCE DAILY. Patient taking differently: TAKE 2.5 mg TABLET BY MOUTH ONCE AT BEDTIME 05/02/17  Yes Leonie Man, MD  furosemide (LASIX) 20 MG tablet Take 20 mg by mouth as directed.   Yes [provider]  isosorbide mononitrate (IMDUR) 60 MG 24 hr tablet TAKE ONE TABLET BY MOUTH ONCE DAILY. Patient taking differently: TAKE 60 mg  TABLET BY MOUTH ONCE DAILY. 09/13/17  Yes Leonie Man, MD  mirtazapine (REMERON) 15 MG tablet Take 7.5 mg by mouth at bedtime.    Yes [provider]  Multiple Vitamin (MULTIVITAMIN) tablet Take 1 tablet by mouth daily.   Yes [provider]  nitroGLYCERIN (NITROSTAT) 0.4 MG SL tablet Place 1 tablet (0.4 mg total) under the tongue every 5  (five) minutes as needed for chest pain. Max 3 doses. 10/05/16  Yes Kilroy, Luke K, PA-C  Omega-3 Fatty Acids (FISH OIL PO) Take 1 capsule by mouth at bedtime.   Yes [provider]  pantoprazole (PROTONIX) 40 MG tablet TAKE ONE TABLET BY MOUTH ONCE DAILY. Patient taking differently: TAKE 40 mg in the evening 09/27/17  Yes Leonie Man, MD  pravastatin (PRAVACHOL) 40 MG tablet Take 1 tablet (40 mg total) by mouth daily. Patient taking differently: Take 40 mg by mouth at bedtime.  11/03/16  Yes Leonie Man, MD  Tiotropium Bromide Monohydrate (SPIRIVA RESPIMAT) 2.5 MCG/ACT AERS Inhale 2.5 mcg into the lungs 2 (two) times daily.   Yes [provider]  enalapril (VASOTEC) 2.5 MG tablet Take 2.5 mg by mouth every evening.     [provider]  labetalol (NORMODYNE) 200 MG tablet TAKE ONE TABLET BY MOUTH AS NEEDED FOR BLOOD PRESSURE IF TOP NUMBER IS GREATER THAN 180. Patient not taking: Reported on 10/04/2017 09/20/17   Leonie Man, MD     Allergies:    Allergies  Allergen Reactions  . Adhesive [Tape] Other (See Comments)    REACTION: redness/irritation at application site. **Certain bandages/adhesives cause this reaction**  . Avelox [Moxifloxacin] Anaphylaxis  . Bactrim [Sulfamethoxazole-Trimethoprim] Shortness Of Breath and Rash    REACTION: Choking, inability to swallow, redness  . Levaquin [Levofloxacin] Shortness Of Breath, Rash and Other (See Comments)    Reaction:Choking Brand name Levaquin ok per pt  . Mucinex [Guaifenesin Er] Shortness Of Breath and Rash  . Penicillins Other (See Comments)    "Passed out" Has patient had a PCN reaction causing immediate rash, facial/tongue/throat swelling, SOB or lightheadedness with hypotension: Unknown Has patient had a PCN reaction causing severe rash involving mucus membranes or skin necrosis: No Has patient had a PCN reaction that required hospitalization: No Has patient had a PCN reaction occurring within the  last 10 years: No If all of the above answers are "NO", then may proceed with Cephalosporin use.   [place] [Pseudoephedrine Hcl] Shortness Of Breath, Rash and Other (See Comments)    REACTION: Choking,  redness, inability to swallow  . Crestor [Rosuvastatin] Other (See Comments)    Leg pain  . Lipitor [Atorvastatin] Other (See Comments)    Leg pain  . Vytorin [Ezetimibe-Simvastatin] Other (See Comments)    Leg pain  . Norvasc [Amlodipine] Other (See Comments)    unknown    Social History:   Social History   Social History  . Marital status: Married    Spouse name: N/A  . Number of children: N/A  . Years of education: N/A   Occupational History  . Not on file.   Social History Main Topics  . Smoking status: Former Smoker    Packs/day: 3.00    Years: 8.50    Types: Cigarettes    Quit date: 04/18/1977  . Smokeless tobacco: Never Used  . Alcohol use No  . Drug use: No  . Sexual activity: Not on file   Other Topics Concern  . Not on file   Social History Narrative   Married mother of 57, grandmother of 68. Her mother is 76 years old. Quit smoking 34 years ago. Does not drink alcohol.  Retired from [employer] in 2012.   Usually presents with oldest daughter.   Previously worked out at [employer] regularly walking 1/2-1 mile a day, but no longer able to do so because of the [employer] decision to no longer cover [employer] cost.    Family History:   The patient's family history includes Breast cancer in her maternal aunt and maternal aunt; CAD in her mother.    ROS:  Please see the history of present illness.  All other ROS reviewed and negative.     Physical Exam/Data:   Vitals:   10/04/17 1515 10/04/17 1615 10/04/17 1630 10/04/17 1645  BP: 131/73 126/79 113/63 132/71  Pulse: 73 70 63 64  Resp: 18 18 16 16   Temp:      TempSrc:      SpO2: 95% 95% 96% 92%  Weight:      Height:       No intake or output data in the 24 hours ending  10/04/17 1715 Filed Weights   10/04/17 1438  Weight: 179 lb (81.2 kg)   Body mass index is 36.15 kg/m.  General:  Well nourished, well developed, in no acute distress HEENT: normal Lymph: no adenopathy Neck: no JVD Endocrine:  No thryomegaly Vascular: No carotid bruits; FA pulses 2+ bilaterally without bruits  Cardiac:  normal S1, S2; RRR; no murmur  Lungs:  clear to auscultation bilaterally, no wheezing, rhonchi or rales  Abd: soft, nontender, no hepatomegaly  Ext: no edema Musculoskeletal:  No deformities, BUE and BLE strength normal and equal Skin: warm and dry  Neuro:  CNs 2-12 intact, no focal abnormalities noted Psych:  Normal affect    EKG:  The ECG that was done was personally reviewed and demonstrates NSR. No ischemic abnormalities.   Relevant CV Studies: 2D Echo 08/03/2017  Study Conclusions  - Left ventricle: The cavity size was normal. Systolic function was   normal. The estimated ejection fraction was in the range of 60%   to 65%. Wall motion was normal; there were no regional wall   motion abnormalities. Doppler parameters are consistent with   abnormal left ventricular relaxation (grade 1 diastolic   dysfunction). Doppler parameters are consistent with elevated   ventricular end-diastolic filling pressure. - Aortic valve: Trileaflet; normal thickness leaflets. There was   mild regurgitation. - Aortic root: The aortic  root was normal in size. - Mitral valve: There was no regurgitation. - Left atrium: The atrium was normal in size. - Right ventricle: The cavity size was normal. Wall thickness was   normal. Systolic function was normal. - Tricuspid valve: There was no regurgitation. - Pulmonary arteries: Systolic pressure could not be accurately   estimated. - Inferior vena cava: The vessel was normal in size. - Pericardium, extracardiac: There was no pericardial effusion.  LHC w/ PCI 09/18/17 Conclusion     2nd Diag lesion, 80 %stenosed. FFR  0.78.  A STENT SYNERGY DES 2.25X12 drug eluting stent was successfully placed. Postdilated to perform a meter.  Post intervention, there is a 0% residual stenosis.  Prox LAD lesion, 40 %stenosed. Focal calcified lesion at the first of the perforator and small first diagonal branch.  Prox Cx lesion, 40 %stenosed.  The left ventricular systolic function is normal. The left ventricular ejection fraction is 55-65% by visual estimate.  LV end diastolic pressure is normal.  Rate Related LBBB (rate in mid 70s)   Single vessel CAD as predicted by CT FFR. Successful FFR guided PCI of the 2nd Diag Branch with DES.    Laboratory Data:  ChemistryNo results for input(s): NA, K, CL, CO2, GLUCOSE, BUN, CREATININE, CALCIUM, GFRNONAA, GFRAA, ANIONGAP in the last 168 hours.  No results for input(s): PROT, ALBUMIN, AST, ALT, ALKPHOS, BILITOT in the last 168 hours. Hematology Recent Labs Lab 10/04/17 1619  WBC 7.4  RBC 4.23  HGB 11.8*  HCT 36.6  MCV 86.5  MCH 27.9  MCHC 32.2  RDW 13.1  PLT 391   Cardiac EnzymesNo results for input(s): TROPONINI in the last 168 hours. No results for input(s): TROPIPOC in the last 168 hours.  BNPNo results for input(s): BNP, PROBNP in the last 168 hours.  DDimer No results for input(s): DDIMER in the last 168 hours.  Radiology/Studies:  Dg Chest Portable 1 View  Result Date: 10/04/2017 CLINICAL DATA:  71 year old female with a history of chest pain EXAM: PORTABLE CHEST 1 VIEW COMPARISON:  Chest x-ray 12/27/2016, CT 08/23/2017 FINDINGS: Cardiomediastinal silhouette unchanged in size and contour. Compared to the prior chest x-ray there is increasing interlobular septal thickening. Linear opacities at the lung bases. No pneumothorax. No confluent airspace disease. No acute displaced fracture. IMPRESSION: Chronic lung changes with questionable developing interstitial edema. No evidence of lobar pneumonia. Electronically Signed   By: Corrie Mckusick D.O.   On:  10/04/2017 16:14    Assessment and Plan:   1. Chest Pain/CAD: substernal CP in the setting of recent coronary DES. currently CP free. EKG w/o ST changes. Initial troponin is negative. We will admit to observation for overnight monitoring. Cycle enzymes x 3. Continue ASA, Plavix and statin. No BB given h/o bradycardia.   2. UTI: diagnosed 2 weeks ago and pt placed on antibiotics. However f/u UA still +. Pt is afebrile. WBC WNL. Pt has multiple allergies. We will consult pharmacy to assist with antibiotic adjustments.  3. Hypokalemia: K 3.3. Will give supplemental K-dur. F/u BMP in the am.   Severity of Illness: The appropriate patient status for this patient is OBSERVATION. Observation status is judged to be reasonable and necessary in order to provide the required intensity of service to ensure the patient's safety. The patient's presenting symptoms, physical exam findings, and initial radiographic and laboratory data in the context of their medical condition is felt to place them at decreased risk for further clinical deterioration. Furthermore, it is anticipated that the  patient will be medically stable for discharge from the hospital within 2 midnights of admission. The following factors support the patient status of observation.   " The patient's presenting symptoms include chest pain. " The physical exam findings include + UA c/w UTI.    For questions or updates, please contact [place] Please consult [email or web address] for contact info under Cardiology/STEMI.    Signed, Lyda Jester, PA-C  10/04/2017 5:15 PM   Attending Note:   The patient was seen and examined.  Agree with assessment and plan as noted above.  Changes made to the above note as needed.  Patient seen and independently examined with Ellen Henri, PA .   We discussed all aspects of the encounter. I agree with the assessment and plan as stated above.  1.   Chest pain:  Patient presents with recurrent CP  . Has hx of recent stent to Diag.  The diag was very small Her other coronaries look normal.  Initial troponin is negative She is now pain free.   ECG is non acute.   Will admit for observation.   Draw troponins  She should be able to be discharged tomorrow if she remains stable.  I have discussed this case with Dr. Ellyn Hack who agrees with the plan    I have spent a total of 40 minutes with patient reviewing hospital  notes , telemetry, EKGs, labs and examining patient as well as establishing an assessment and plan that was discussed with the patient. > 50% of time was spent in direct patient care.    Thayer Headings, Brooke Bonito., MD, [facility] 10/04/2017, 6:01 PM [address]  [place] Pager [phone]

## 2017-10-04 NOTE — ED Triage Notes (Signed)
Per [facility], Pt is coming from [place] where she was complaining of mid-center chest pressure that started approximately 40 minutes ago. Pt reports some dizziness after Nitro, but denied and secondary symptoms before treatment. Alert and Oriented x4. Hx of attempted stent placement two weeks ago. Vitals per EMS: Initial 158/84. Last 104/40, 73 HR, 96% on RA. Pt took three Nitro on her own.

## 2017-10-04 NOTE — Progress Notes (Addendum)
2135: Dr. [place] Lions with cardiology paged in regards to patients PTA medications. Missing Xanax, Buspar, and Remeron. Awaiting new orders or return page.   New orders received. Thank you.

## 2017-10-04 NOTE — ED Notes (Signed)
Xray at the bedside.

## 2017-10-05 DIAGNOSIS — E876 Hypokalemia: Secondary | ICD-10-CM

## 2017-10-05 DIAGNOSIS — R079 Chest pain, unspecified: Secondary | ICD-10-CM | POA: Diagnosis not present

## 2017-10-05 DIAGNOSIS — I2510 Atherosclerotic heart disease of native coronary artery without angina pectoris: Secondary | ICD-10-CM | POA: Diagnosis not present

## 2017-10-05 LAB — BASIC METABOLIC PANEL
ANION GAP: 9 (ref 5–15)
ANION GAP: 11 (ref 5–15)
BUN: 10 mg/dL (ref 6–20)
BUN: 9 mg/dL (ref 6–20)
CHLORIDE: 103 mmol/L (ref 101–111)
CO2: 29 mmol/L (ref 22–32)
CO2: 24 mmol/L (ref 22–32)
Calcium: 9.1 mg/dL (ref 8.9–10.3)
Calcium: 9.3 mg/dL (ref 8.9–10.3)
Chloride: 105 mmol/L (ref 101–111)
Creatinine, Ser: 0.66 mg/dL (ref 0.44–1.00)
Creatinine, Ser: 0.62 mg/dL (ref 0.44–1.00)
GFR calc Af Amer: >60 mL/min (ref >60)
GLUCOSE: 84 mg/dL (ref 65–99)
Glucose, Bld: 104 mg/dL — ABNORMAL HIGH (ref 65–99)
POTASSIUM: 2.9 mmol/L — AB (ref 3.5–5.1)
POTASSIUM: 3.5 mmol/L (ref 3.5–5.1)
SODIUM: 141 mmol/L (ref 135–145)
SODIUM: 140 mmol/L (ref 135–145)

## 2017-10-05 LAB — CBC
HEMATOCRIT: 35.4 % — AB (ref 36.0–46.0)
HEMOGLOBIN: 11.2 g/dL — AB (ref 12.0–15.0)
MCH: 27.6 pg (ref 26.0–34.0)
MCHC: 31.6 g/dL (ref 30.0–36.0)
MCV: 87.2 fL (ref 78.0–100.0)
Platelets: 353 10*3/uL (ref 150–400)
RBC: 4.06 MIL/uL (ref 3.87–5.11)
RDW: 12.9 % (ref 11.5–15.5)
WBC: 6.6 10*3/uL (ref 4.0–10.5)

## 2017-10-05 LAB — TROPONIN I

## 2017-10-05 LAB — GLUCOSE, CAPILLARY
GLUCOSE-CAPILLARY: 92 mg/dL (ref 65–99)
Glucose-Capillary: 102 mg/dL — ABNORMAL HIGH (ref 65–99)

## 2017-10-05 LAB — HEPARIN LEVEL (UNFRACTIONATED)
HEPARIN UNFRACTIONATED: 0.13 [IU]/mL — AB (ref 0.30–0.70)
Heparin Unfractionated: 0.42 IU/mL (ref 0.30–0.70)

## 2017-10-05 MED ORDER — FUROSEMIDE 20 MG PO TABS: 20.0000 mg | ORAL_TABLET | ORAL | 3 refills | Status: DC

## 2017-10-05 MED ORDER — CEFPODOXIME PROXETIL 100 MG PO TABS: 100.0000 mg | ORAL_TABLET | Freq: Two times a day (BID) | ORAL | 0 refills | Status: DC

## 2017-10-05 MED ORDER — POTASSIUM CHLORIDE CRYS ER 20 MEQ PO TBCR
40.0000 meq | EXTENDED_RELEASE_TABLET | Freq: Two times a day (BID) | ORAL | Status: DC
  Administered 2017-10-05: 40 meq via ORAL
  Filled 2017-10-05: qty 2

## 2017-10-05 MED ORDER — POTASSIUM CHLORIDE CRYS ER 20 MEQ PO TBCR: 20.0000 meq | EXTENDED_RELEASE_TABLET | ORAL | 3 refills | Status: DC

## 2017-10-05 MED ORDER — HEPARIN BOLUS VIA INFUSION
2000.0000 [IU] | Freq: Once | INTRAVENOUS | Status: AC
  Administered 2017-10-05: 2000 [IU] via INTRAVENOUS
  Filled 2017-10-05: qty 2000

## 2017-10-05 NOTE — Progress Notes (Signed)
Progress Note  Patient Name: Vickie [name] Date of Encounter: 10/05/2017  Primary Cardiologist: Ellyn Hack   Subjective   71 year old female with a history of coronary artery disease-status post PCI to her small first actual vessel. She will current returns with recurrent chest pain.  Inpatient Medications    Scheduled Meds: . ALPRAZolam  0.25 mg Oral BID  . aspirin  324 mg Oral NOW   Or  . aspirin  300 mg Rectal NOW  . aspirin EC  81 mg Oral Daily  . busPIRone  15 mg Oral BID  . clopidogrel  75 mg Oral Q breakfast  . enalapril  2.5 mg Oral QPM  . isosorbide mononitrate  60 mg Oral Daily  . mirtazapine  7.5 mg Oral QHS  . pantoprazole  40 mg Oral q1800  . potassium chloride  40 mEq Oral BID  . pravastatin  40 mg Oral QPM   Continuous Infusions: . cefTRIAXone (ROCEPHIN)  IV Stopped (10/04/17 2115)  . heparin 1,000 Units/hr (10/05/17 0349)   PRN Meds: acetaminophen, nitroGLYCERIN, ondansetron (ZOFRAN) IV   Vital Signs    Vitals:   10/04/17 1845 10/04/17 1915 10/04/17 2111 10/05/17 0548  BP: 132/71 123/66 (!) 163/77 139/74  Pulse: 63 (!) 59 63 (!) 58  Resp: 18 15 14 14   Temp:    97.9 F (36.6 C)  TempSrc:    Oral  SpO2: 95% 93% 91% 99%  Weight:   176 lb 11.2 oz (80.2 kg) 176 lb 11.2 oz (80.2 kg)  Height:        Intake/Output Summary (Last 24 hours) at 10/05/17 1138 Last data filed at 10/05/17 1107  Gross per 24 hour  Intake           201.05 ml  Output             1400 ml  Net         -1198.95 ml   Filed Weights   10/04/17 1438 10/04/17 2111 10/05/17 0548  Weight: 179 lb (81.2 kg) 176 lb 11.2 oz (80.2 kg) 176 lb 11.2 oz (80.2 kg)    Telemetry    NSR  - Personally Reviewed  ECG     NSR  - Personally Reviewed  Physical Exam   GEN: No acute distress.   Neck: No JVD Cardiac: RRR, no murmurs, rubs, or gallops.  Respiratory: Clear to auscultation bilaterally. GI: Soft, nontender, non-distended  MS: No edema; No deformity. Neuro:  Nonfocal    Psych: Normal affect   Labs    Chemistry Recent Labs Lab 10/04/17 1619 10/05/17 0523  NA 140 141  K 3.3* 2.9*  CL 103 103  CO2 28 29  GLUCOSE 96 104*  BUN 9 10  CREATININE 0.71 0.66  CALCIUM 9.3 9.1  PROT 6.3*  --   ALBUMIN 3.3*  --   AST 27  --   ALT 19  --   ALKPHOS 74  --   BILITOT 0.4  --   GFRNONAA >60 >60  GFRAA >60 >60  ANIONGAP 9 9     Hematology Recent Labs Lab 10/04/17 1619 10/05/17 0523  WBC 7.4 6.6  RBC 4.23 4.06  HGB 11.8* 11.2*  HCT 36.6 35.4*  MCV 86.5 87.2  MCH 27.9 27.6  MCHC 32.2 31.6  RDW 13.1 12.9  PLT 391 353    Cardiac Enzymes Recent Labs Lab 10/04/17 1619 10/04/17 1941 10/04/17 2233 10/05/17 0523  TROPONINI <0.03 <0.03 <0.03 <0.03   No results for input(s):  TROPIPOC in the last 168 hours.   BNPNo results for input(s): BNP, PROBNP in the last 168 hours.   DDimer No results for input(s): DDIMER in the last 168 hours.   Radiology    Dg Chest Portable 1 View  Result Date: 10/04/2017 CLINICAL DATA:  71 year old female with a history of chest pain EXAM: PORTABLE CHEST 1 VIEW COMPARISON:  Chest x-ray 12/27/2016, CT 08/23/2017 FINDINGS: Cardiomediastinal silhouette unchanged in size and contour. Compared to the prior chest x-ray there is increasing interlobular septal thickening. Linear opacities at the lung bases. No pneumothorax. No confluent airspace disease. No acute displaced fracture. IMPRESSION: Chronic lung changes with questionable developing interstitial edema. No evidence of lobar pneumonia. Electronically Signed   By: Corrie Mckusick D.O.   On: 10/04/2017 16:14    Cardiac Studies     Patient Profile     71 y.o. female  With CAD and long standing CP   Assessment & Plan    1. Coronary artery disease: Patient presents with recurrent chest pain. She's had chest pain for many years She status post recent PCI of the ostium of her first diagonal vessel. Her other coronary arteries are completely normal.  Her enzymes are  negative. Her EKG is unremarkable. We'll discharge   her to home today. She'll follow-up with Dr. Ellyn Hack.  2. Hypokalemia: She was given potassium this am  Recheck BMP prior to DC  She is on lasix at home 20 mg QOD. Will add Kdur 20 meq QOD on the days that she takes the lasix  . Anticipate DC today    For questions or updates, please contact [place] Please consult [email or web address] for contact info under Cardiology/STEMI.      Signed, Mertie Moores, MD  10/05/2017, 11:38 AM

## 2017-10-05 NOTE — Progress Notes (Signed)
[place] for heparin dosing Indication: chest pain/ACS  Allergies  Allergen Reactions  . Adhesive [Tape] Other (See Comments)    REACTION: redness/irritation at application site. **Certain bandages/adhesives cause this reaction**  . Avelox [Moxifloxacin] Anaphylaxis  . Bactrim [Sulfamethoxazole-Trimethoprim] Shortness Of Breath and Rash    REACTION: Choking, inability to swallow, redness  . Levaquin [Levofloxacin] Shortness Of Breath, Rash and Other (See Comments)    Reaction:Choking Brand name Levaquin ok per pt  . Mucinex [Guaifenesin Er] Shortness Of Breath and Rash  . Penicillins Other (See Comments)    "Passed out" Has patient had a PCN reaction causing immediate rash, facial/tongue/throat swelling, SOB or lightheadedness with hypotension: Unknown Has patient had a PCN reaction causing severe rash involving mucus membranes or skin necrosis: No Has patient had a PCN reaction that required hospitalization: No Has patient had a PCN reaction occurring within the last 10 years: No If all of the above answers are "NO", then may proceed with Cephalosporin use.   Ebbie [place] [Pseudoephedrine Hcl] Shortness Of Breath, Rash and Other (See Comments)    REACTION: Choking, redness, inability to swallow  . Crestor [Rosuvastatin] Other (See Comments)    Leg pain  . Lipitor [Atorvastatin] Other (See Comments)    Leg pain  . Vytorin [Ezetimibe-Simvastatin] Other (See Comments)    Leg pain  . Norvasc [Amlodipine] Other (See Comments)    unknown    Patient Measurements: Height: 4\' 11"  (149.9 cm) Weight: 176 lb 11.2 oz (80.2 kg) IBW/kg (Calculated) : 43.2 Heparin Dosing Weight: 62.2  Vital Signs: Temp: 97.9 F (36.6 C) (10/11 0548) Temp Source: Oral (10/11 0548) BP: 139/74 (10/11 0548) Pulse Rate: 58 (10/11 0548)  Labs:  Recent Labs  10/04/17 1619 10/04/17 1941 10/04/17 2233 10/04/17 2348 10/05/17 0523 10/05/17 0841  HGB 11.8*  --   --    --  11.2*  --   HCT 36.6  --   --   --  35.4*  --   PLT 391  --   --   --  353  --   APTT 25  --   --   --   --   --   LABPROT 12.3  --   --   --   --   --   INR 0.92  --   --   --   --   --   HEPARINUNFRC  --   --   --  0.13*  --  0.42  CREATININE 0.71  --   --   --  0.66  --   TROPONINI <0.03 <0.03 <0.03  --  <0.03  --     Estimated Creatinine Clearance: 59.9 mL/min (by C-G formula based on SCr of 0.66 mg/dL).   Medical History: Past Medical History:  Diagnosis Date  . Anginal pain ([place])   . Anxiety   . Anxiety disorder    With apparent panic attacks  . Arthritis    "hands, knees, back" (09/18/2017)  . CAD S/P percutaneous coronary angioplasty 08/2017   Single-vessel CAD involving second diagonal branch treated with DES stent Synergy 2.25 mm x 12 mm (2.4 mm)  . Chronic back pain    "all over" (09/18/2017)  . COPD (chronic obstructive pulmonary disease) ([place])    PFTs were done in 2008 at [facility]  . Depression   . Dyspnea   . Essential hypertension   . GERD (gastroesophageal reflux disease)   . Hiatal hernia   .  History of left bundle branch block (LBBB) 08/2017   Rate Related LBBB noted in cath lab  . Hyperlipidemia   . Macular degeneration    right eye  . Myocardial infarction [facility]) 2007   "medically induced"  . Nonocclusive coronary atherosclerosis of native coronary artery 2006 through 2012   multi caths 2012, 2006, [phone] (3817 complicated by catheter-induced dissection of small nondominant RCA, patent in 2009 with no residual abnormality); Myoview August 2013: LOW RISK, normal EF. ; Echocardiogram [name] - January 2014) moderate LVH, EF 55-65%. No significant valvular disease.  . OSA (obstructive sleep apnea) 9/25/ 2012   tested 2009; tetested sleep study 07/2011--titration 09/20/2011 now use Bi-PAP  . OSA on CPAP   . Pneumonia    "several times in 2017; 3 times already this year" (09/18/2017)  . Scoliosis   . Type II diabetes mellitus ([place])       Assessment: 72 YOF admitted with mid center chest pressure. Pharmacy consulted for heparin dosing for ACS. PMH: nonobstructive atherosclerosis- hx of multiple caths, stent placement two weeks ago (9/24) to 2nd diag.  On DAPT with aspirin and plavix PTA.  Hgb 11.8, Hct 36.6, Plt 391. EKG Normal sinus rhythm with septal infarct. No reported bleeding, no anticoag PTA.  HL therapeutic   Goal of Therapy:  Heparin level 0.3-0.7 units/ml Monitor platelets by anticoagulation protocol: Yes    Plan:  -Continue heparin at 1000 units/hr -Daily HL, CBC -Anticipate discharge today   Harvel Quale 10/05/2017 11:59 AM

## 2017-10-05 NOTE — Plan of Care (Signed)
Problem: Activity: Goal: Risk for activity intolerance will decrease Outcome: Progressing Pt ambulated from bed to bathroom with standby assist with ease. No c/o pain or signs of distress. Educated on importance of utilizing call light to call for assistance prior to ambulation

## 2017-10-05 NOTE — Progress Notes (Signed)
Patient received discharge information and acknowledged understanding of it. RN answered all questions. Patient IV was removed.  

## 2017-10-05 NOTE — Progress Notes (Signed)
ANTICOAGULATION CONSULT NOTE - Follow Up Consult  Pharmacy Consult for heparin Indication: chest pain/ACS  Labs:  Recent Labs  10/04/17 1619 10/04/17 1941 10/04/17 2233 10/04/17 2348  HGB 11.8*  --   --   --   HCT 36.6  --   --   --   PLT 391  --   --   --   APTT 25  --   --   --   LABPROT 12.3  --   --   --   INR 0.92  --   --   --   HEPARINUNFRC  --   --   --  0.13*  CREATININE 0.71  --   --   --   TROPONINI <0.03 <0.03 <0.03  --     Assessment: 71yo female subtherapeutic on heparin with initial dosing for CP.  Goal of Therapy:  Heparin level 0.3-0.7 units/ml   Plan:  Will rebolus with heparin 2000 units and increase gtt by 3 units/kg/hr to 1000 units/hr and check level in [place], PharmD, BCPS  10/05/2017,12:31 AM

## 2017-10-05 NOTE — Discharge Summary (Signed)
Discharge Summary    Patient ID: Vickie [name],  MRN: [id number], DOB/AGE: Aug 29, 1946 71 y.o.  Admit date: 10/04/2017 Discharge date: 10/05/2017   Primary Care Provider: Sharilyn Sites Primary Cardiologist: Dr. Ellyn Hack  Discharge Diagnoses    Principal Problem:   Chest pain with moderate risk for cardiac etiology Active Problems:   Hypothyroidism   ANXIETY DEPRESSION   Essential hypertension   GASTROESOPHAGEAL REFLUX DISEASE   Obesity (BMI 30-39.9)   Coronary artery disease involving native coronary artery of native heart with angina pectoris (HCC)   Hyperlipidemia LDL goal <70   Claudication (HCC)   Bilateral lower extremity edema   CAD S/P percutaneous coronary angioplasty   Allergies Allergies  Allergen Reactions  . Adhesive [Tape] Other (See Comments)    REACTION: redness/irritation at application site. **Certain bandages/adhesives cause this reaction**  . Avelox [Moxifloxacin] Anaphylaxis  . Bactrim [Sulfamethoxazole-Trimethoprim] Shortness Of Breath and Rash    REACTION: Choking, inability to swallow, redness  . Levaquin [Levofloxacin] Shortness Of Breath, Rash and Other (See Comments)    Reaction:Choking Brand name Levaquin ok per pt  . Mucinex [Guaifenesin Er] Shortness Of Breath and Rash  . Penicillins Other (See Comments)    "Passed out" Has patient had a PCN reaction causing immediate rash, facial/tongue/throat swelling, SOB or lightheadedness with hypotension: Unknown Has patient had a PCN reaction causing severe rash involving mucus membranes or skin necrosis: No Has patient had a PCN reaction that required hospitalization: No Has patient had a PCN reaction occurring within the last 10 years: No If all of the above answers are "NO", then may proceed with Cephalosporin use.   Ebbie [place] [Pseudoephedrine Hcl] Shortness Of Breath, Rash and Other (See Comments)    REACTION: Choking, redness, inability to swallow  . Crestor [Rosuvastatin] Other (See  Comments)    Leg pain  . Lipitor [Atorvastatin] Other (See Comments)    Leg pain  . Vytorin [Ezetimibe-Simvastatin] Other (See Comments)    Leg pain  . Norvasc [Amlodipine] Other (See Comments)    unknown     History of Present Illness     71 yo female with PMH of CAD s/p recent PCI + DES to Diag 09/18/17, HTN, HL, COPD, DM and GERD who was seen in the office 09/05/17 with Dr. Ellyn Hack for follow up. Noted to have chest pain back in 8/18 and underwent coronary CT with calcium score of 358 and moderate calcified stenosis of the left main and pLAD. Then sent for CT FFR that showed 0.8 diag branch and 0.88 in the mid Lcx. At office visit reported worsening symptoms and was referred for cardiac cath.    She presented for outpatient cath on 09/18/17 with Dr. Ellyn Hack and underwent successful PCI/DES x1 to the 2nd diag (80% stenosed with FFR at 0.78). There was mild 40% proximal LAD and 40% prox Cx, treated medically. Plan for DAPT with ASA/Plavix for at least 3 months, then can consider stopping ASA if needed. No BB 2/2 to hx of fatigue per office notes. Pt was discharged on 09/20/17. Of note, echo 08/03/17 showed normal LVEF at 60-65% and G1DD. Mild AI noted.   Pt also developed a UTI after discharge and was placed on antibiotics.   She now presents back to the ED with complaint of recurrent CP. Episode occurred at rest. She was out shopping at [facility] and was using [employer] cart when she suddenly developed crushing substernal chest tightness, radiating up to her anterior throat. Pain felt similar to  angina prior to stent. She was also diaphoretic and short of breath. No relationship with meals. She took 3 SL NTG w/o relief. Her daughter called 911. She was given 3 baby ASA. On arrival to [facility], she was CP free. No recurrent pain. Initial troponin is negative. K is low at 3.3. SCr normal. CBC unremarkable. EKG NSR w/o ischemic changes. VSS. CXR with questionable developing interstitial edema. No evidence  of lobar pneumonia. No dyspnea currently.   She reports full med compliance with ASA and Plavix. No missed doses. UA shows unresolved UTI. She has also been compliant with antibiotics.   Hospital Course     Consultants: none  Patient was admitted to cardiology for observation overnight. Troponin x 4 negative, no EKG changes. She is chest pain free. She was discharge with instructions to take 20 mg lasix every other day with 20 mEq potassium. She was hypokalemic this admission with K 2.9 (3.3) after receiving no lasix.   Follow up UA still positive. Pharmacy consulted and recommended 1g ceftriaxone. She tolerated ceftriaxone well (PCN allergy). She was discharged on 100 mg vantin BID x 6 days. Recommend follow up with PCP.   Patient seen and examined by Dr. Acie Fredrickson today and was stable for discharge. All follow up has been arranged.  _____________  Discharge Vitals Blood pressure 139/74, pulse (!) 58, temperature 97.9 F (36.6 C), temperature source Oral, resp. rate 14, height 4\' 11"  (1.499 m), weight 176 lb 11.2 oz (80.2 kg), SpO2 99 %.  Filed Weights   10/04/17 1438 10/04/17 2111 10/05/17 0548  Weight: 179 lb (81.2 kg) 176 lb 11.2 oz (80.2 kg) 176 lb 11.2 oz (80.2 kg)    Labs & Radiologic Studies    CBC  Recent Labs  10/04/17 1619 10/05/17 0523  WBC 7.4 6.6  NEUTROABS 5.2  --   HGB 11.8* 11.2*  HCT 36.6 35.4*  MCV 86.5 87.2  PLT 391 938   Basic Metabolic Panel  Recent Labs  10/04/17 1619 10/05/17 0523  NA 140 141  K 3.3* 2.9*  CL 103 103  CO2 28 29  GLUCOSE 96 104*  BUN 9 10  CREATININE 0.71 0.66  CALCIUM 9.3 9.1  MG 1.7  --    Liver Function Tests  Recent Labs  10/04/17 1619  AST 27  ALT 19  ALKPHOS 74  BILITOT 0.4  PROT 6.3*  ALBUMIN 3.3*   No results for input(s): LIPASE, AMYLASE in the last 72 hours. Cardiac Enzymes  Recent Labs  10/04/17 1941 10/04/17 2233 10/05/17 0523  TROPONINI <0.03 <0.03 <0.03   BNP Invalid input(s):  POCBNP D-Dimer No results for input(s): DDIMER in the last 72 hours. Hemoglobin A1C No results for input(s): HGBA1C in the last 72 hours. Fasting Lipid Panel No results for input(s): CHOL, HDL, LDLCALC, TRIG, CHOLHDL, LDLDIRECT in the last 72 hours. Thyroid Function Tests No results for input(s): TSH, T4TOTAL, T3FREE, THYROIDAB in the last 72 hours.  Invalid input(s): FREET3 _____________  Dg Chest Portable 1 View  Result Date: 10/04/2017 CLINICAL DATA:  71 year old female with a history of chest pain EXAM: PORTABLE CHEST 1 VIEW COMPARISON:  Chest x-ray 12/27/2016, CT 08/23/2017 FINDINGS: Cardiomediastinal silhouette unchanged in size and contour. Compared to the prior chest x-ray there is increasing interlobular septal thickening. Linear opacities at the lung bases. No pneumothorax. No confluent airspace disease. No acute displaced fracture. IMPRESSION: Chronic lung changes with questionable developing interstitial edema. No evidence of lobar pneumonia. Electronically Signed   By: York Cerise  Earleen Newport D.O.   On: 10/04/2017 16:14     Diagnostic Studies/Procedures    Left heart cath 09/18/17  2nd Diag lesion, 80 %stenosed. FFR 0.78.  A STENT SYNERGY DES 2.25X12 drug eluting stent was successfully placed. Postdilated to perform a meter.  Post intervention, there is a 0% residual stenosis.  Prox LAD lesion, 40 %stenosed. Focal calcified lesion at the first of the perforator and small first diagonal branch.  Prox Cx lesion, 40 %stenosed.  The left ventricular systolic function is normal. The left ventricular ejection fraction is 55-65% by visual estimate.  LV end diastolic pressure is normal.  Rate Related LBBB (rate in mid 70s)   Single vessel CAD as predicted by CT FFR. Successful FFR guided PCI of the 2nd Diag Branch with DES.  Plan: Mostly because the patient's anxiety, we will monitor her tonight with planned discharge tomorrow morning.  She will be on aspirin plus Plavix for  minimum of 3 months at which time we can stop aspirin if necessary.  She has not been on a beta blocker in the past because of fatigue. -Would simply continue home medications.  She does have what appears to be a rate related left bundle branch block converting to left bundle in the 70s bpm   Echocardiogram 08/03/17: Study Conclusions - Left ventricle: The cavity size was normal. Systolic function was   normal. The estimated ejection fraction was in the range of 60%   to 65%. Wall motion was normal; there were no regional wall   motion abnormalities. Doppler parameters are consistent with   abnormal left ventricular relaxation (grade 1 diastolic   dysfunction). Doppler parameters are consistent with elevated   ventricular end-diastolic filling pressure. - Aortic valve: Trileaflet; normal thickness leaflets. There was   mild regurgitation. - Aortic root: The aortic root was normal in size. - Mitral valve: There was no regurgitation. - Left atrium: The atrium was normal in size. - Right ventricle: The cavity size was normal. Wall thickness was   normal. Systolic function was normal. - Tricuspid valve: There was no regurgitation. - Pulmonary arteries: Systolic pressure could not be accurately   estimated. - Inferior vena cava: The vessel was normal in size. - Pericardium, extracardiac: There was no pericardial effusion.   Disposition   Pt is being discharged home today in good condition.  Follow-up Plans & Appointments    Follow-up Information    Almyra Deforest, [place] Follow up on 10/19/2017.   Specialties:  Cardiology, Radiology Why:  8:30 am for hospital follow up Contact information: [address] [place] [address] [phone]          Discharge Instructions    Diet - low sodium heart healthy    Complete by:  As directed    Increase activity slowly    Complete by:  As directed       Discharge Medications   Current Discharge Medication List     START taking these medications   Details  cefpodoxime (VANTIN) 100 MG tablet Take 1 tablet (100 mg total) by mouth 2 (two) times daily. Qty: 12 tablet, Refills: 0    potassium chloride SA (K-DUR,KLOR-CON) 20 MEQ tablet Take 1 tablet (20 mEq total) by mouth every other day. Take on days that you take lasix. Qty: 30 tablet, Refills: 3      CONTINUE these medications which have CHANGED   Details  furosemide (LASIX) 20 MG tablet Take 1 tablet (20 mg total) by mouth every other day.  Qty: 30 tablet, Refills: 3      CONTINUE these medications which have NOT CHANGED   Details  albuterol (PROAIR HFA) 108 (90 BASE) MCG/ACT inhaler Inhale 1 puff into the lungs every 6 (six) hours as needed for wheezing or shortness of breath.    albuterol (PROVENTIL) (2.5 MG/3ML) 0.083% nebulizer solution Take 2.5 mg by nebulization every 6 (six) hours as needed for wheezing or shortness of breath.     ALPRAZolam (XANAX) 0.5 MG tablet Take 0.25 mg by mouth 2 (two) times daily.     AMBULATORY NON FORMULARY MEDICATION Inject 300 mg into the skin as directed. Medication Name:Inclisiran sodium 300 mg vs placebo    aspirin EC 81 MG tablet Take 1 tablet (81 mg total) by mouth daily.    Biotin w/ Vitamins C & E (HAIR/SKIN/NAILS PO) Take 1 tablet by mouth at bedtime.    busPIRone (BUSPAR) 15 MG tablet Take 15 mg by mouth 2 (two) times daily.    Calcium Carb-Cholecalciferol (CALCIUM 600 + D PO) Take 1 tablet by mouth 2 (two) times daily.    CINNAMON PO Take 1 capsule by mouth 2 (two) times daily.    clopidogrel (PLAVIX) 75 MG tablet Take 1 tablet (75 mg total) by mouth daily with breakfast. Qty: 90 tablet, Refills: 1    Coenzyme Q10 (COQ10) 100 MG CAPS Take 100 mg by mouth every morning.    CRANBERRY PO Take 1 capsule by mouth 2 (two) times daily.     escitalopram (LEXAPRO) 20 MG tablet Take 20 mg by mouth every morning.     felodipine (PLENDIL) 2.5 MG 24 hr tablet TAKE ONE TABLET BY MOUTH ONCE DAILY. Qty:  30 tablet, Refills: 9    isosorbide mononitrate (IMDUR) 60 MG 24 hr tablet TAKE ONE TABLET BY MOUTH ONCE DAILY. Qty: 90 tablet, Refills: 3    mirtazapine (REMERON) 15 MG tablet Take 7.5 mg by mouth at bedtime.     Multiple Vitamin (MULTIVITAMIN) tablet Take 1 tablet by mouth daily.    nitroGLYCERIN (NITROSTAT) 0.4 MG SL tablet Place 1 tablet (0.4 mg total) under the tongue every 5 (five) minutes as needed for chest pain. Max 3 doses. Qty: 25 tablet, Refills: 4    Omega-3 Fatty Acids (FISH OIL PO) Take 1 capsule by mouth at bedtime.    pantoprazole (PROTONIX) 40 MG tablet TAKE ONE TABLET BY MOUTH ONCE DAILY. Qty: 30 tablet, Refills: 0    pravastatin (PRAVACHOL) 40 MG tablet Take 1 tablet (40 mg total) by mouth daily. Qty: 30 tablet, Refills: 10    Tiotropium Bromide Monohydrate (SPIRIVA RESPIMAT) 2.5 MCG/ACT AERS Inhale 2.5 mcg into the lungs 2 (two) times daily.    enalapril (VASOTEC) 2.5 MG tablet Take 2.5 mg by mouth every evening.     labetalol (NORMODYNE) 200 MG tablet TAKE ONE TABLET BY MOUTH AS NEEDED FOR BLOOD PRESSURE IF TOP NUMBER IS GREATER THAN 180. Qty: 15 tablet, Refills: 0   Associated Diagnoses: Essential hypertension      STOP taking these medications     doxycycline (VIBRAMYCIN) 100 MG capsule           Outstanding Labs/Studies   Needs BMP  Duration of Discharge Encounter   Greater than 30 minutes including physician time.  Signed, Tami Lin Duke PA-C 10/05/2017, 12:02 PM  Attending Note:   The patient was seen and examined.  Agree with assessment and plan as noted above.  Changes made to the above note as needed.  Patient  seen and independently examined with Doreene Adas, PA .   We discussed all aspects of the encounter. I agree with the assessment and plan as stated above.  1.   Noncardiac CP Pt presented with non cardiac CP . She ruled out for myocardial infarction. Her EKG was unchanged. She was discharged in stable condition.  2.  Coronary artery disease: She had a stenosis in the ostial diagonal region. It was successfully stented. Her other coronary arteries are completely normal. She'll follow-up with Dr. Ellyn Hack.   I have spent a total of 40 minutes with patient reviewing hospital  notes , telemetry, EKGs, labs and examining patient as well as establishing an assessment and plan that was discussed with the patient. > 50% of time was spent in direct patient care.    Thayer Headings, Brooke Bonito., MD, [facility] 10/06/2017, 3:24 PM [address]  [place] Pager [phone]

## 2017-10-07 LAB — URINE CULTURE: Culture: >=100000 — AB

## 2017-10-10 ENCOUNTER — Other Ambulatory Visit: Payer: Self-pay | Admitting: Cardiology

## 2017-10-19 ENCOUNTER — Ambulatory Visit (INDEPENDENT_AMBULATORY_CARE_PROVIDER_SITE_OTHER): Payer: Medicare Other | Admitting: Physician Assistant

## 2017-10-19 ENCOUNTER — Encounter: Payer: Self-pay | Admitting: Physician Assistant

## 2017-10-19 VITALS — BP 122/70 | HR 70 | Ht 59.0 in | Wt 178.0 lb

## 2017-10-19 DIAGNOSIS — E785 Hyperlipidemia, unspecified: Secondary | ICD-10-CM | POA: Diagnosis not present

## 2017-10-19 DIAGNOSIS — E119 Type 2 diabetes mellitus without complications: Secondary | ICD-10-CM | POA: Diagnosis not present

## 2017-10-19 DIAGNOSIS — I2510 Atherosclerotic heart disease of native coronary artery without angina pectoris: Secondary | ICD-10-CM

## 2017-10-19 DIAGNOSIS — J449 Chronic obstructive pulmonary disease, unspecified: Secondary | ICD-10-CM

## 2017-10-19 DIAGNOSIS — R079 Chest pain, unspecified: Secondary | ICD-10-CM | POA: Diagnosis not present

## 2017-10-19 DIAGNOSIS — E876 Hypokalemia: Secondary | ICD-10-CM

## 2017-10-19 DIAGNOSIS — G4733 Obstructive sleep apnea (adult) (pediatric): Secondary | ICD-10-CM

## 2017-10-19 DIAGNOSIS — I10 Essential (primary) hypertension: Secondary | ICD-10-CM

## 2017-10-19 DIAGNOSIS — Z9989 Dependence on other enabling machines and devices: Secondary | ICD-10-CM

## 2017-10-19 LAB — BASIC METABOLIC PANEL
BUN/Creatinine Ratio: 16 (ref 12–28)
BUN: 11 mg/dL (ref 8–27)
CO2: 26 mmol/L (ref 20–29)
CREATININE: 0.7 mg/dL (ref 0.57–1.00)
Calcium: 10.4 mg/dL — ABNORMAL HIGH (ref 8.7–10.3)
Chloride: 103 mmol/L (ref 96–106)
GFR, EST AFRICAN AMERICAN: 102 mL/min/{1.73_m2} (ref >59)
GFR, EST NON AFRICAN AMERICAN: 88 mL/min/{1.73_m2} (ref >59)
Glucose: 102 mg/dL — ABNORMAL HIGH (ref 65–99)
POTASSIUM: 4.5 mmol/L (ref 3.5–5.2)
SODIUM: 144 mmol/L (ref 134–144)

## 2017-10-19 NOTE — Progress Notes (Signed)
Kidney function remain stable.

## 2017-10-19 NOTE — Progress Notes (Signed)
Potassium normalized.

## 2017-10-19 NOTE — Progress Notes (Signed)
Cardiology Office Note    Date:  10/21/2017   ID:  Vickie [name], DOB [birth year], MRN [id number]  PCP:  Sharilyn Sites, MD  Cardiologist:  Dr. Ellyn Hack  (former patient of Dr. Terance Ice and before that Dr. Janene Madeira)  Chief Complaint  Patient presents with  . Follow-up    seen by Dr. Ellyn Hack    History of Present Illness:  Vickie [name] is a 71 y.o. female with PMH of CAD, COPD, HTN, HLD, DM II and OSA on CPAP. He had a long standing history of coronary artery disease, cardiac catheterization in 0938 complicated by coronary dissection of the nondominant RCA. This vessel was healed on follow-up cath in 2009. Last echocardiogram obtained on 08/03/2017 showed EF 60-65%, crit 20, mild AI. Last cardiac catheterization was on 09/18/2017, she underwent Synergy DES 2.5 x 12 mm stent to second diagonal, she continued to have 40% proximal LAD, 40% proximal left circumflex lesion. She presented back to the hospital on 10/04/2017 with recurrent chest pain. It occurred at rest while she was shopping at [employer]. She was kept overnight in the hospital, serial enzyme was normal. She was treated for hypokalemia, and discharged on potassium supplement on the days when she take as needed Lasix.  She has been doing well since she left the hospital, denying any further chest pain or shortness breath. She does not exercise a whole lot. She is still enrolled in the [place] trial to control her cholesterol. She is essentially taking her Lasix every Monday Wednesday and Friday, on the same day she will also take potassium supplement as well. It is controlling her lower extremity swelling very well. I will continue on the current regimen. We plan to obtain outpatient metabolic panel today to check for her potassium level. She'll follow-up with Dr. Ellyn Hack in 3-4 months.    Past Medical History:  Diagnosis Date  . Anginal pain ([place])   . Anxiety   . Anxiety disorder    With apparent panic attacks  .  Arthritis    "hands, knees, back" (09/18/2017)  . CAD S/P percutaneous coronary angioplasty 08/2017   Single-vessel CAD involving second diagonal branch treated with DES stent Synergy 2.25 mm x 12 mm (2.4 mm)  . Chronic back pain    "all over" (09/18/2017)  . COPD (chronic obstructive pulmonary disease) ([place])    PFTs were done in 2008 at [facility]  . Depression   . Dyspnea   . Essential hypertension   . GERD (gastroesophageal reflux disease)   . Hiatal hernia   . History of left bundle branch block (LBBB) 08/2017   Rate Related LBBB noted in cath lab  . Hyperlipidemia   . Macular degeneration    right eye  . Myocardial infarction [facility]) 2007   "medically induced"  . Nonocclusive coronary atherosclerosis of native coronary artery 2006 through 2012   multi caths 2012, 2006, [id number] (6967 complicated by catheter-induced dissection of small nondominant RCA, patent in 2009 with no residual abnormality); Myoview August 2013: LOW RISK, normal EF. ; Echocardiogram [name] - January 2014) moderate LVH, EF 55-65%. No significant valvular disease.  . OSA (obstructive sleep apnea) 9/25/ 2012   tested 2009; tetested sleep study 07/2011--titration 09/20/2011 now use Bi-PAP  . OSA on CPAP   . Pneumonia    "several times in 2017; 3 times already this year" (09/18/2017)  . Scoliosis   . Type II diabetes mellitus ([place])     Past Surgical History:  Procedure Laterality Date  . CARDIAC CATHETERIZATION  [id number]; 2009   Non-occlusice CAD - only 80% ostial SP1;  NON DOMINANNT  RCA (catheter insuced dissection with MI in 2007 --> resolved by 2009 cath)  . CATARACT EXTRACTION, BILATERAL Bilateral 2017   Toric lens "in the left eye only"  . CORONARY STENT INTERVENTION N/A 09/18/2017   Procedure: CORONARY STENT INTERVENTION;  Surgeon: Leonie Man, MD;  Location: [facility] INVASIVE CV LAB: DES PCI Diag2: Synergy DES 2.25 mm x 12 mm (2.4 mm)  . DIAGNOSTIC LAPAROSCOPY Right   . DILATION AND CURETTAGE OF  UTERUS    . EYE SURGERY    . INTRAVASCULAR PRESSURE WIRE/FFR STUDY N/A 09/18/2017   Procedure: INTRAVASCULAR PRESSURE WIRE/FFR STUDY;  Surgeon: Leonie Man, MD;  Location: [facility] CV LAB;  Service: Cardiovascular: FFR Diag 2 -- 0.78 (significcant) --> PCI  . KNEE ARTHROSCOPY Right   . LEFT HEART CATH AND CORONARY ANGIOGRAPHY N/A 09/18/2017   Procedure: LEFT HEART CATH AND CORONARY ANGIOGRAPHY;  Surgeon: Leonie Man, MD;  Location: [facility] INVASIVE CV LAB: Culpril - 80% Diag2 (FFR 0.78)- PCI.  pLAD 40%, pCx 40%. Post PCI LBBB. LVEDP - ~20 mmHg. EF 55-60%  . LIPOMA EXCISION Right ~ 2015   anterior abdomen  . PARS PLANA VITRECTOMY W/ REPAIR OF MACULAR HOLE Right    Unsuccessful repair.  Hole filled  . VAGINAL HYSTERECTOMY      Current Medications: Outpatient Medications Prior to Visit  Medication Sig Dispense Refill  . albuterol (PROAIR HFA) 108 (90 BASE) MCG/ACT inhaler Inhale 1 puff into the lungs every 6 (six) hours as needed for wheezing or shortness of breath.    Marland Kitchen albuterol (PROVENTIL) (2.5 MG/3ML) 0.083% nebulizer solution Take 2.5 mg by nebulization every 6 (six) hours as needed for wheezing or shortness of breath.     . ALPRAZolam (XANAX) 0.5 MG tablet Take 0.25 mg by mouth 2 (two) times daily.     . AMBULATORY NON FORMULARY MEDICATION Inject 300 mg into the skin as directed. Medication Name:Inclisiran sodium 300 mg vs placebo    . aspirin EC 81 MG tablet Take 1 tablet (81 mg total) by mouth daily.    . Biotin w/ Vitamins C & E (HAIR/SKIN/NAILS PO) Take 1 tablet by mouth at bedtime.    . busPIRone (BUSPAR) 15 MG tablet Take 15 mg by mouth 2 (two) times daily.    . Calcium Carb-Cholecalciferol (CALCIUM 600 + D PO) Take 1 tablet by mouth 2 (two) times daily.    Marland Kitchen CINNAMON PO Take 1 capsule by mouth 2 (two) times daily.    . clopidogrel (PLAVIX) 75 MG tablet Take 1 tablet (75 mg total) by mouth daily with breakfast. 90 tablet 1  . Coenzyme Q10 (COQ10) 100 MG CAPS Take 100 mg by  mouth every morning.    Marland Kitchen CRANBERRY PO Take 1 capsule by mouth 2 (two) times daily.     Marland Kitchen escitalopram (LEXAPRO) 20 MG tablet Take 20 mg by mouth every morning.     . furosemide (LASIX) 20 MG tablet Take 1 tablet (20 mg total) by mouth every other day. 30 tablet 3  . isosorbide mononitrate (IMDUR) 60 MG 24 hr tablet TAKE ONE TABLET BY MOUTH ONCE DAILY. (Patient taking differently: TAKE 60 mg  TABLET BY MOUTH ONCE DAILY.) 90 tablet 3  . labetalol (NORMODYNE) 200 MG tablet TAKE ONE TABLET BY MOUTH AS NEEDED FOR BLOOD PRESSURE IF TOP NUMBER IS GREATER THAN 180. 15 tablet  0  . mirtazapine (REMERON) 15 MG tablet Take 7.5 mg by mouth at bedtime.     . Multiple Vitamin (MULTIVITAMIN) tablet Take 1 tablet by mouth daily.    . nitroGLYCERIN (NITROSTAT) 0.4 MG SL tablet Place 1 tablet (0.4 mg total) under the tongue every 5 (five) minutes as needed for chest pain. Max 3 doses. 25 tablet 4  . Omega-3 Fatty Acids (FISH OIL PO) Take 1 capsule by mouth at bedtime.    . pantoprazole (PROTONIX) 40 MG tablet TAKE ONE TABLET BY MOUTH ONCE DAILY. (Patient taking differently: TAKE 40 mg in the evening) 30 tablet 0  . potassium chloride SA (K-DUR,KLOR-CON) 20 MEQ tablet Take 1 tablet (20 mEq total) by mouth every other day. Take on days that you take lasix. 30 tablet 3  . pravastatin (PRAVACHOL) 40 MG tablet TAKE ONE TABLET BY MOUTH DAILY. 30 tablet 11  . Tiotropium Bromide Monohydrate (SPIRIVA RESPIMAT) 2.5 MCG/ACT AERS Inhale 2.5 mcg into the lungs 2 (two) times daily.    . enalapril (VASOTEC) 2.5 MG tablet Take 2.5 mg by mouth every evening.     . felodipine (PLENDIL) 2.5 MG 24 hr tablet TAKE ONE TABLET BY MOUTH ONCE DAILY. (Patient not taking: Reported on 10/19/2017) 30 tablet 9  . cefpodoxime (VANTIN) 100 MG tablet Take 1 tablet (100 mg total) by mouth 2 (two) times daily. (Patient not taking: Reported on 10/19/2017) 12 tablet 0   No facility-administered medications prior to visit.      Allergies:   Adhesive  [tape]; Avelox [moxifloxacin]; Bactrim [sulfamethoxazole-trimethoprim]; Levaquin [levofloxacin]; Mucinex [guaifenesin er]; Penicillins; Sudafed [pseudoephedrine hcl]; Crestor [rosuvastatin]; Lipitor [atorvastatin]; Vytorin [ezetimibe-simvastatin]; and Norvasc [amlodipine]   Social History   Social History  . Marital status: Married    Spouse name: N/A  . Number of children: N/A  . Years of education: N/A   Social History Main Topics  . Smoking status: Former Smoker    Packs/day: 3.00    Years: 8.50    Types: Cigarettes    Quit date: 04/18/1977  . Smokeless tobacco: Never Used  . Alcohol use No  . Drug use: No  . Sexual activity: Not Asked   Other Topics Concern  . None   Social History Narrative   Married mother of 4, grandmother of 61. Her mother is 48 years old. Quit smoking 34 years ago. Does not drink alcohol.  Retired from [employer] in 2012.   Usually presents with oldest daughter.   Previously worked out at [employer] regularly walking 1/2-1 mile a day, but no longer able to do so because of the [employer] decision to no longer cover [employer] cost.     Family History:  The patient's family history includes Breast cancer in her maternal aunt and maternal aunt; CAD in her mother.   ROS:   Please see the history of present illness.    ROS All other systems reviewed and are negative.   PHYSICAL EXAM:   VS:  BP 122/70 (BP Location: Left Arm, Patient Position: Sitting, Cuff Size: Normal)   Pulse 70   Ht 4\' 11"  (1.499 m)   Wt 178 lb (80.7 kg)   BMI 35.95 kg/m    GEN: Well nourished, well developed, in no acute distress  HEENT: normal  Neck: no JVD, carotid bruits, or masses Cardiac: RRR; no murmurs, rubs, or gallops,no edema  Respiratory:  clear to auscultation bilaterally, normal work of breathing GI: soft, nontender, nondistended, + BS MS:  no deformity or atrophy  Skin: warm and dry, no rash Neuro:  Alert and Oriented x 3, Strength  and sensation are intact Psych: euthymic mood, full affect  Wt Readings from Last 3 Encounters:  10/19/17 178 lb (80.7 kg)  10/05/17 176 lb 11.2 oz (80.2 kg)  09/29/17 179 lb (81.2 kg)      Studies/Labs Reviewed:   EKG:  EKG is not ordered today.    Recent Labs: 10/04/2017: ALT 19; Magnesium 1.7 10/05/2017: Hemoglobin 11.2; Platelets 353 10/19/2017: BUN 11; Creatinine, Ser 0.70; Potassium 4.5; Sodium 144   Lipid Panel    Component Value Date/Time   CHOL 217 (H) 02/10/2017 0843   TRIG 214 (H) 02/10/2017 0843   HDL 53 02/10/2017 0843   CHOLHDL 4.1 02/10/2017 0843   CHOLHDL 3.6 09/09/2016 1021   VLDL 32 (H) 09/09/2016 1021   LDLCALC 121 (H) 02/10/2017 0843    Additional studies/ records that were reviewed today include:   Echo 08/03/2017 LV EF: 60% -   65%  ------------------------------------------------------------------- Indications:      Dyspnea on exertion (R06.09).  ------------------------------------------------------------------- History:   PMH:   Coronary artery disease.  Risk factors:  LE edema. Former tobacco use. Hypertension. Diabetes mellitus. Obese.   ------------------------------------------------------------------- Study Conclusions  - Left ventricle: The cavity size was normal. Systolic function was   normal. The estimated ejection fraction was in the range of 60%   to 65%. Wall motion was normal; there were no regional wall   motion abnormalities. Doppler parameters are consistent with   abnormal left ventricular relaxation (grade 1 diastolic   dysfunction). Doppler parameters are consistent with elevated   ventricular end-diastolic filling pressure. - Aortic valve: Trileaflet; normal thickness leaflets. There was   mild regurgitation. - Aortic root: The aortic root was normal in size. - Mitral valve: There was no regurgitation. - Left atrium: The atrium was normal in size. - Right ventricle: The cavity size was normal. Wall thickness was    normal. Systolic function was normal. - Tricuspid valve: There was no regurgitation. - Pulmonary arteries: Systolic pressure could not be accurately   estimated. - Inferior vena cava: The vessel was normal in size. - Pericardium, extracardiac: There was no pericardial effusion.    Cath 09/18/2017: CORONARY STENT INTERVENTION  INTRAVASCULAR PRESSURE WIRE/FFR STUDY - 2ND DIAGONAL  LEFT HEART CATH AND CORONARY ANGIOGRAPHY   Conclusion   2nd Diag lesion, 80 %stenosed. FFR 0.78.  A STENT SYNERGY DES 2.25X12 drug eluting stent was successfully placed. Postdilated to perform a meter.  Post intervention, there is a 0% residual stenosis.  Prox LAD lesion, 40 %stenosed. Focal calcified lesion at the first of the perforator and small first diagonal branch.  Prox Cx lesion, 40 %stenosed.  The left ventricular systolic function is normal. The left ventricular ejection fraction is 55-65% by visual estimate.  LV end diastolic pressure is mild to moderately elevated (19-21 mmHg)  Rate Related LBBB (rate in mid 70s)  Single vessel CAD as predicted by CT FFR. Successful FFR guided PCI of the 2nd Diag Branch with DES.      ASSESSMENT:    1. Chest pain, unspecified type   2. Hypokalemia   3. Coronary artery disease involving native coronary artery of native heart without angina pectoris   4. Chronic obstructive pulmonary disease, unspecified COPD type ([place])   5. Essential hypertension   6. Hyperlipidemia, unspecified hyperlipidemia type   7. Controlled type 2 diabetes mellitus without complication, without long-term current use of insulin (  [place])   8. OSA on CPAP      PLAN:  In order of problems listed above:  1. CAD: Recent readmission for chest pain, this has resolved without any further recurrence. Chest pain was felt to be atypical in nature. No further workup is needed.  2. Hypokalemia: Taking potassium supplement. Obtain basic metabolic panel today  3. Hypertension:  Blood pressure well controlled  4. Hyperlipidemia: enrolled in [place] trial  5. DM 2: Will defer management to primary care provider.    Medication Adjustments/Labs and Tests Ordered: Current medicines are reviewed at length with the patient today.  Concerns regarding medicines are outlined above.  Medication changes, Labs and Tests ordered today are listed in the Patient Instructions below. Patient Instructions  LABS TODAY - AT [facility]  [facility]     Your physician wants you to follow-up in  (3- 4 MONTHS ) JAN /FEB. 2019 WITH DR HARDING.     If you need a refill on your cardiac medications before your next appointment, please call your pharmacy.      Hilbert Corrigan, [place]  10/21/2017 9:41 AM    [address], [place], [place]  [address] Phone: [phone]; Fax: [phone]

## 2017-10-19 NOTE — Patient Instructions (Signed)
LABS TODAY - AT [facility]  [facility]     Your physician wants you to follow-up in  (3- 4 MONTHS ) JAN /FEB. 2019 WITH DR HARDING.     If you need a refill on your cardiac medications before your next appointment, please call your pharmacy.

## 2017-10-21 ENCOUNTER — Encounter: Payer: Self-pay | Admitting: Physician Assistant

## 2017-10-25 ENCOUNTER — Other Ambulatory Visit (HOSPITAL_COMMUNITY)
Admission: RE | Admit: 2017-10-25 | Discharge: 2017-10-25 | Disposition: A | Discharge status: Home / Self Care | Payer: Medicare Other | Source: Other Acute Inpatient Hospital | Attending: Urology | Admitting: Urology

## 2017-10-25 ENCOUNTER — Ambulatory Visit (INDEPENDENT_AMBULATORY_CARE_PROVIDER_SITE_OTHER): Payer: Medicare Other | Admitting: Urology

## 2017-10-25 DIAGNOSIS — N3001 Acute cystitis with hematuria: Secondary | ICD-10-CM | POA: Diagnosis not present

## 2017-10-25 DIAGNOSIS — R350 Frequency of micturition: Secondary | ICD-10-CM | POA: Diagnosis not present

## 2017-10-28 LAB — URINE CULTURE

## 2017-10-30 DIAGNOSIS — E1140 Type 2 diabetes mellitus with diabetic neuropathy, unspecified: Secondary | ICD-10-CM | POA: Diagnosis not present

## 2017-10-30 DIAGNOSIS — E1151 Type 2 diabetes mellitus with diabetic peripheral angiopathy without gangrene: Secondary | ICD-10-CM | POA: Diagnosis not present

## 2017-11-03 ENCOUNTER — Ambulatory Visit: Payer: Medicare Other | Admitting: Student

## 2017-11-07 ENCOUNTER — Encounter: Payer: Self-pay | Admitting: *Deleted

## 2017-11-07 DIAGNOSIS — Z23 Encounter for immunization: Secondary | ICD-10-CM | POA: Diagnosis not present

## 2017-11-07 DIAGNOSIS — Z006 Encounter for examination for normal comparison and control in clinical research program: Secondary | ICD-10-CM

## 2017-11-07 NOTE — Progress Notes (Signed)
Subject to Research clinic for [id number] in the [place] 10 trial.  No c/o, aes reviewed and documented and saes reviewed, documented and reported to sponsor.  Injection given and follow up appointment scheduled.

## 2017-11-22 DIAGNOSIS — J449 Chronic obstructive pulmonary disease, unspecified: Secondary | ICD-10-CM | POA: Diagnosis not present

## 2017-11-22 DIAGNOSIS — R110 Nausea: Secondary | ICD-10-CM | POA: Diagnosis not present

## 2017-11-22 DIAGNOSIS — E119 Type 2 diabetes mellitus without complications: Secondary | ICD-10-CM | POA: Diagnosis not present

## 2017-11-22 DIAGNOSIS — I10 Essential (primary) hypertension: Secondary | ICD-10-CM | POA: Diagnosis not present

## 2017-11-28 ENCOUNTER — Other Ambulatory Visit: Payer: Self-pay | Admitting: Cardiology

## 2017-11-29 NOTE — Telephone Encounter (Signed)
Usually, I would say this is something that the PCP can order.  We can refill for now.

## 2017-12-06 ENCOUNTER — Ambulatory Visit: Payer: Medicare Other | Admitting: Urology

## 2017-12-06 DIAGNOSIS — R350 Frequency of micturition: Secondary | ICD-10-CM

## 2017-12-06 DIAGNOSIS — N3001 Acute cystitis with hematuria: Secondary | ICD-10-CM | POA: Diagnosis not present

## 2017-12-06 DIAGNOSIS — R351 Nocturia: Secondary | ICD-10-CM

## 2018-01-16 ENCOUNTER — Encounter: Payer: Self-pay | Admitting: *Deleted

## 2018-01-16 DIAGNOSIS — Z006 Encounter for examination for normal comparison and control in clinical research program: Secondary | ICD-10-CM

## 2018-01-16 NOTE — Progress Notes (Signed)
Subject to research clinic for [id number] in the [facility] 10 research study. Today no c/o, aes or saes to report. Still recovering from UTI reported at last visit and going to PCP tomorrow, stated it was cleared but wants to make sure with him before telling me.  Will address at next visit.

## 2018-01-17 ENCOUNTER — Ambulatory Visit: Payer: Medicare Other | Admitting: Urology

## 2018-01-22 DIAGNOSIS — E1151 Type 2 diabetes mellitus with diabetic peripheral angiopathy without gangrene: Secondary | ICD-10-CM | POA: Diagnosis not present

## 2018-01-22 DIAGNOSIS — E1140 Type 2 diabetes mellitus with diabetic neuropathy, unspecified: Secondary | ICD-10-CM | POA: Diagnosis not present

## 2018-01-24 ENCOUNTER — Ambulatory Visit: Payer: Medicare Other | Admitting: Cardiology

## 2018-01-30 ENCOUNTER — Other Ambulatory Visit: Payer: Self-pay | Admitting: Cardiology

## 2018-01-30 DIAGNOSIS — Q681 Congenital deformity of finger(s) and hand: Secondary | ICD-10-CM | POA: Diagnosis not present

## 2018-01-30 DIAGNOSIS — Z1389 Encounter for screening for other disorder: Secondary | ICD-10-CM | POA: Diagnosis not present

## 2018-01-30 DIAGNOSIS — E1140 Type 2 diabetes mellitus with diabetic neuropathy, unspecified: Secondary | ICD-10-CM | POA: Diagnosis not present

## 2018-01-30 DIAGNOSIS — I2510 Atherosclerotic heart disease of native coronary artery without angina pectoris: Secondary | ICD-10-CM | POA: Diagnosis not present

## 2018-01-30 DIAGNOSIS — Z6836 Body mass index (BMI) 36.0-36.9, adult: Secondary | ICD-10-CM | POA: Diagnosis not present

## 2018-01-30 DIAGNOSIS — G894 Chronic pain syndrome: Secondary | ICD-10-CM | POA: Diagnosis not present

## 2018-01-30 DIAGNOSIS — E6609 Other obesity due to excess calories: Secondary | ICD-10-CM | POA: Diagnosis not present

## 2018-02-06 ENCOUNTER — Ambulatory Visit: Payer: Medicare Other | Admitting: Cardiology

## 2018-02-06 VITALS — BP 130/68 | HR 95 | Ht 59.0 in | Wt 184.0 lb

## 2018-02-06 DIAGNOSIS — R0609 Other forms of dyspnea: Secondary | ICD-10-CM

## 2018-02-06 DIAGNOSIS — E785 Hyperlipidemia, unspecified: Secondary | ICD-10-CM

## 2018-02-06 DIAGNOSIS — I2510 Atherosclerotic heart disease of native coronary artery without angina pectoris: Secondary | ICD-10-CM | POA: Diagnosis not present

## 2018-02-06 DIAGNOSIS — Z9861 Coronary angioplasty status: Secondary | ICD-10-CM

## 2018-02-06 DIAGNOSIS — R0600 Dyspnea, unspecified: Secondary | ICD-10-CM

## 2018-02-06 DIAGNOSIS — I10 Essential (primary) hypertension: Secondary | ICD-10-CM

## 2018-02-06 MED ORDER — LABETALOL HCL 200 MG PO TABS: 200.0000 mg | ORAL_TABLET | Freq: Every day | ORAL | 6 refills | Status: DC

## 2018-02-06 NOTE — Patient Instructions (Addendum)
MEDICATION INSTRUCTION STOP ASPIRIN   RESTART LABETALOL  ONE TABLET DAILY.   DO NOT RESTART  ENALAPRIL OR PLENDIL  Your physician recommends that you schedule a follow-up appointment in 3 [place]. [place]   Your physician wants you to follow-up in [place]. You will receive a reminder letter in the mail two months in advance. If you don't receive a letter, please call our office to schedule the follow-up appointment.

## 2018-02-06 NOTE — Progress Notes (Signed)
PCP: Vickie [name] Sites, MD  Clinic Note: Chief Complaint  Patient presents with  . Follow-up    3 months  . Coronary Artery Disease    No further angina pain.  Mild shortness of breath    HPI: Vickie [name] [name] is a 72 y.o. female with a PMH below who presents today for 56-month follow-up for CAD-PCI-diag.  Vickie [name] [name] is a former Dr. Rollene [name] patient and before that Dr. Janene [name]. She has been followed for many years dating back to 2007 due to a catheter induced coronary dissection of the nondominant RCA.  Every follow-up evaluation since had been fine up until last fall.  She also chronically has unusual chest pain episodes and dyspnea episodes.   She did very well until back in September when she was having some more anginal symptoms and had an abnormal stress test.  Coronary CTA that led to cardiac catheterization.  I saw her immediately post PCI and she was actually feeling poorly with dysuria and UTI.  She had exertional dyspnea, but no more anginal pain.  Vickie [name] [name] returns today in great spirits.  She is doing Recruitment consultant.  She denies any.  She then went to the hospital 5 days later with precordial chest pain and ruled out for MI.  Vickie [name] [name] was last seen on in October 2018 by Vickie [name] Deforest, PA-C for hospital evaluation/post PCI.  Recent Hospitalizations: ER 10/04/2017  Studies Personally Reviewed - (if available, images/films reviewed: From Epic Chart or Care Everywhere)   no new studies  Interval History: Recurrent symptoms of chest tightness or pressure.  She has been exercising at the [facility] but had to back off because of some knee injury.  She does have mild exertional dyspnea, but that is actually getting better as well.Other than having some GERD symptoms, she is not having any chest discomfort.    The GERD is been pretty well controlled with her PPI.  No PND, orthopnea or edema. She denies any rapid irregular heartbeats or palpitations.  No syncope/near syncope, or TIA/amaurosis  fugax symptoms. No melena, hematochezia, hematuria, or epstaxis. No claudication. Overall proceed Is feeling quite well.  She indicates that she has not been on any of her blood pressure medication since she left the hospital last time.  No longer taking enalapril, felodipine or labetalol.  She does feel her heart rate picking up some but not irregular.  ROS: A comprehensive was performed.  Pertinent symptoms noted in HPI  Review of Systems  Constitutional: Negative for malaise/fatigue.  HENT: Negative for congestion and nosebleeds.   Respiratory: Negative for cough and shortness of breath.   Gastrointestinal: Positive for heartburn. Negative for abdominal pain, blood in stool, constipation, diarrhea and melena.  Genitourinary: Negative for dysuria and hematuria.  Musculoskeletal: Positive for joint pain. Negative for back pain.  Neurological: Positive for dizziness (Rarely now).  Psychiatric/Behavioral: The patient is nervous/anxious.   All other systems reviewed and are negative.  I have reviewed and (if needed) personally updated the patient's problem list, medications, allergies, past medical and surgical history, social and family history.   Past Medical History:  Diagnosis Date  . Anginal pain ([place])   . Anxiety   . Anxiety disorder    With apparent panic attacks  . Arthritis    "hands, knees, back" (09/18/2017)  . CAD S/P percutaneous coronary angioplasty 08/2017   Single-vessel CAD involving second diagonal branch treated with DES stent Synergy 2.25 mm x 12 mm (2.4 mm)  . Chronic back pain    "  all over" (09/18/2017)  . COPD (chronic obstructive pulmonary disease) ([place])    PFTs were done in 2008 at [facility]  . Depression   . Dyspnea   . Essential hypertension   . GERD (gastroesophageal reflux disease)   . Hiatal hernia   . History of left bundle branch block (LBBB) 08/2017   Rate Related LBBB noted in cath lab  . Hyperlipidemia   . Macular degeneration    right eye    . Myocardial infarction [facility]) 2007   "medically induced"  . Nonocclusive coronary atherosclerosis of native coronary artery 2006 through 2012   multi caths 2012, 2006, [id number] ([id number] complicated by catheter-induced dissection of small nondominant RCA, patent in 2009 with no residual abnormality); Myoview August 2013: LOW RISK, normal EF. ; Echocardiogram Vickie [name] [name] - January 2014) moderate LVH, EF 55-65%. No significant valvular disease.  . OSA (obstructive sleep apnea) 9/25/ 2012   tested 2009; tetested sleep study 07/2011--titration 09/20/2011 now use Bi-PAP  . OSA on CPAP   . Pneumonia    "several times in 2017; 3 times already this year" (09/18/2017)  . Scoliosis   . Type II diabetes mellitus ([place])     Past Surgical History:  Procedure Laterality Date  . CARDIAC CATHETERIZATION  [id number]; 2009   Non-occlusice CAD - only 80% ostial SP1;  NON DOMINANNT  RCA (catheter insuced dissection with MI in 2007 --> resolved by 2009 cath)  . CATARACT EXTRACTION, BILATERAL Bilateral 2017   Toric lens "in the left eye only"  . CORONARY STENT INTERVENTION N/A 09/18/2017   Procedure: CORONARY STENT INTERVENTION;  Surgeon: Vickie [name] Man, MD;  Location: [facility] INVASIVE CV LAB: DES PCI Diag2: Synergy DES 2.25 mm x 12 mm (2.4 mm)  . DIAGNOSTIC LAPAROSCOPY Right   . DILATION AND CURETTAGE OF UTERUS    . EYE SURGERY    . INTRAVASCULAR PRESSURE WIRE/FFR STUDY N/A 09/18/2017   Procedure: INTRAVASCULAR PRESSURE WIRE/FFR STUDY;  Surgeon: Vickie [name] Man, MD;  Location: [facility] CV LAB;  Service: Cardiovascular: FFR Diag 2 -- 0.78 (significcant) --> PCI  . KNEE ARTHROSCOPY Right   . LEFT HEART CATH AND CORONARY ANGIOGRAPHY N/A 09/18/2017   Procedure: LEFT HEART CATH AND CORONARY ANGIOGRAPHY;  Surgeon: Vickie [name] Man, MD;  Location: [facility] INVASIVE CV LAB: Culpril - 80% Diag2 (FFR 0.78)- PCI.  pLAD 40%, pCx 40%. Post PCI LBBB. LVEDP - ~20 mmHg. EF 55-60%  . LIPOMA EXCISION Right ~ 2015   anterior abdomen  .  PARS PLANA VITRECTOMY W/ REPAIR OF MACULAR HOLE Right    Unsuccessful repair.  Hole filled  . VAGINAL HYSTERECTOMY      Left Heart Catheter Coronary Angiography-PCI November 18, 2017 Single vessel CAD as predicted by CT FFR. --> Successful FFR guided PCI of the 2nd Diag Branch with Synergy 2.25 x 12 mm DES.   Diagnostic Diagram                                                              Post-Intervention Diagram              Current Meds  Medication Sig  . albuterol (PROAIR HFA) 108 (90 BASE) MCG/ACT inhaler Inhale 1 puff into the lungs every 6 (six) hours as needed for wheezing or shortness  of breath.  Marland Kitchen albuterol (PROVENTIL) (2.5 MG/3ML) 0.083% nebulizer solution Take 2.5 mg by nebulization every 6 (six) hours as needed for wheezing or shortness of breath.   . ALPRAZolam (XANAX) 0.5 MG tablet Take 0.25 mg by mouth 2 (two) times daily.   . AMBULATORY NON FORMULARY MEDICATION Inject 300 mg into the skin as directed. Medication Name:Inclisiran sodium 300 mg vs placebo  . aspirin EC 81 MG tablet Take 1 tablet (81 mg total) by mouth daily.  . Biotin w/ Vitamins C & E (HAIR/SKIN/NAILS PO) Take 1 tablet by mouth at bedtime.  . busPIRone (BUSPAR) 15 MG tablet Take 15 mg by mouth 2 (two) times daily.  . Calcium Carb-Cholecalciferol (CALCIUM 600 + D PO) Take 1 tablet by mouth 2 (two) times daily.  Marland Kitchen CINNAMON PO Take 1 capsule by mouth 2 (two) times daily.  . clopidogrel (PLAVIX) 75 MG tablet Take 1 tablet (75 mg total) by mouth daily with breakfast.  . Coenzyme Q10 (COQ10) 100 MG CAPS Take 100 mg by mouth every morning.  Marland Kitchen CRANBERRY PO Take 1 capsule by mouth 2 (two) times daily.   .    . escitalopram (LEXAPRO) 20 MG tablet Take 20 mg by mouth every morning.   .    . furosemide (LASIX) 20 MG tablet Take 1 tablet (20 mg total) by mouth every other day.  . isosorbide mononitrate (IMDUR) 60 MG 24 hr tablet TAKE ONE TABLET BY MOUTH ONCE DAILY. (Patient taking differently: TAKE 60 mg  TABLET BY  MOUTH ONCE DAILY.)  .    . mirtazapine (REMERON) 15 MG tablet Take 7.5 mg by mouth at bedtime.   . Multiple Vitamin (MULTIVITAMIN) tablet Take 1 tablet by mouth daily.  . nitroGLYCERIN (NITROSTAT) 0.4 MG SL tablet Place 1 tablet (0.4 mg total) under the tongue every 5 (five) minutes as needed for chest pain. Max 3 doses.  . Omega-3 Fatty Acids (FISH OIL PO) Take 1 capsule by mouth at bedtime.  . pantoprazole (PROTONIX) 40 MG tablet TAKE ONE TABLET BY MOUTH ONCE DAILY.  Marland Kitchen potassium chloride SA (K-DUR,KLOR-CON) 20 MEQ tablet Take 1 tablet (20 mEq total) by mouth every other day. Take on days that you take lasix.  Marland Kitchen pravastatin (PRAVACHOL) 40 MG tablet TAKE ONE TABLET BY MOUTH DAILY.  Marland Kitchen Tiotropium Bromide Monohydrate (SPIRIVA RESPIMAT) 2.5 MCG/ACT AERS Inhale 2.5 mcg into the lungs 2 (two) times daily.  --She has not been on the enalapril, felodipine for labetalol that she had previously been on.  She also did not tolerate Imdur.  Allergies  Allergen Reactions  . Adhesive [Tape] Other (See Comments)    REACTION: redness/irritation at application site. **Certain bandages/adhesives cause this reaction**  . Avelox [Moxifloxacin] Anaphylaxis  . Bactrim [Sulfamethoxazole-Trimethoprim] Anaphylaxis, Shortness Of Breath and Rash    REACTION: Choking, inability to swallow, redness  . Levaquin [Levofloxacin] Anaphylaxis, Shortness Of Breath, Rash and Other (See Comments)    Reaction:Choking Brand name Levaquin ok per pt  . Mucinex [Guaifenesin Er] Anaphylaxis, Shortness Of Breath and Rash  . Penicillins Other (See Comments)    "Passed out" Has patient had a PCN reaction causing immediate rash, facial/tongue/throat swelling, SOB or lightheadedness with hypotension: Unknown Has patient had a PCN reaction causing severe rash involving mucus membranes or skin necrosis: No Has patient had a PCN reaction that required hospitalization: No Has patient had a PCN reaction occurring within the last 10 years:  No If all of the above answers are "NO", then may proceed  with Cephalosporin use.   [place] [Pseudoephedrine Hcl] Shortness Of Breath, Rash and Other (See Comments)    REACTION: Choking, redness, inability to swallow  . Crestor [Rosuvastatin] Other (See Comments)    Leg pain  . Lipitor [Atorvastatin] Other (See Comments)    Leg pain  . Vytorin [Ezetimibe-Simvastatin] Other (See Comments)    Leg pain  . Norvasc [Amlodipine] Other (See Comments)    unknown    Social History   Tobacco Use  . Smoking status: Former Smoker    Packs/day: 3.00    Years: 8.50    Pack years: 25.50    Types: Cigarettes    Last attempt to quit: 04/18/1977    Years since quitting: 40.8  . Smokeless tobacco: Never Used  Substance Use Topics  . Alcohol use: No  . Drug use: No   Social History   Social History Narrative   Married mother of 4, grandmother of 12. Her mother is 50 years old. Quit smoking 34 years ago. Does not drink alcohol.  Retired from [employer] in 2012.   Usually presents with oldest daughter.   Previously worked out at [employer] regularly walking 1/2-1 mile a day, but no longer able to do so because of the [employer] decision to no longer cover [employer] cost.    family history includes Breast cancer in her maternal aunt and maternal aunt; CAD in her mother.  Wt Readings from Last 3 Encounters:  02/06/18 184 lb (83.5 kg)  01/16/18 177 lb (80.3 kg)  11/07/17 172 lb (78 kg)    PHYSICAL EXAM BP 130/68   Pulse 95   Ht 4\' 11"  (1.499 m)   Wt 184 lb (83.5 kg)   BMI 37.16 kg/m   Physical Exam  Constitutional: She is oriented to person, place, and time. She appears well-developed and well-nourished. No distress.  Healthy-appearing.  Well-groomed  HENT:  Head: Normocephalic and atraumatic.  Neck: No hepatojugular reflux and no JVD present. Carotid bruit is not present.  Cardiovascular: Normal rate, regular rhythm and intact distal pulses.   Occasional extrasystoles are present. PMI is not displaced. Exam reveals no gallop and no friction rub.  Murmur heard.  Medium-pitched harsh crescendo-decrescendo early systolic murmur is present at the upper right sternal border radiating to the neck. Pulmonary/Chest: Effort normal and breath sounds normal. No respiratory distress. She has no wheezes. She has no rales. She exhibits no tenderness.  Mild diffuse interstitial sounds, crackles but no rales or rhonchi  Abdominal: Soft. Bowel sounds are normal. She exhibits no distension. There is no tenderness.  Musculoskeletal: Normal range of motion. 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 She exhibits no edema.  Neurological: She is alert and oriented to person, place, and time.  Skin: Skin is warm and dry.  Psychiatric: She has a normal mood and affect. Her behavior is normal. Judgment and thought content normal.  Nursing note and vitals reviewed.   Adult ECG Report Not checked  Other studies Reviewed: Additional studies/ records that were reviewed today include:  Recent Labs:  --As her cardiologist, I am also blinded that she is labs have been ordered, but results not available. Lab Results  Component Value Date   CHOL 217 (H) 02/10/2017   HDL 53 02/10/2017   LDLCALC 121 (H) 02/10/2017   TRIG 214 (H) 02/10/2017   CHOLHDL 4.1 02/10/2017    ASSESSMENT / PLAN: Problem List Items Addressed  This Visit    CAD S/P percutaneous coronary angioplasty - Primary (Chronic)    FFR guided PCI of D2 with DES.  Angina symptoms are well controlled.  She is quite happy.  Hoping to get back into full exercise program. Plan: Continue aspirin plus Plavix. Continue pravastatin. We will start low-dose beta-blocker (labetalol) --we will try her standing dose of 200 mg twice daily, but this may be either too high or too low.  If too high, but cut in half, if too low would probably  then have her go back on the ACE inhibitor as well.      DOE (dyspnea on exertion)     her dyspnea does seem to be related to deconditioning as she had not really been all that active before.  Trying to get back to doing exercise program.      Essential hypertension (Chronic)    Blood pressure looks pretty good, but with a heart rate of 95, we can probably restart her beta-blocker.  I had tried to convert her once from labetalol for some reason and always get put back to labetalol.  Eventually we will try to get her back to carvedilol.  Continue to hold ACE inhibitor and felodipine.       Hyperlipidemia LDL goal <70 (Chronic)    Is nearing the end of the [place] trial -study coordinators are following labs.  Is also taking pravastatin.  Hope to have the blinded results opened up in May.  At which time if effective, she can stay on the study medication.         Current medicines are reviewed at length with the patient today. (+/- concerns)  -cholesterol medication in May.  At that time she will be completing her research trial which group she was then will be revealed and if effective, she will be maintained on the medication. The following changes have been made:Restart beta-blocker  Patient Instructions  MEDICATION INSTRUCTION STOP ASPIRIN   RESTART LABETALOL  ONE TABLET DAILY.   DO NOT RESTART  ENALAPRIL OR PLENDIL  Your physician recommends that you schedule a follow-up appointment in [address] [place]. [place]   Your physician wants you to follow-up in [place]. You will receive a reminder letter in the mail two months in advance. If you don't receive a letter, please call our office to schedule the follow-up appointment.    Studies Ordered:   No orders of the defined types were placed in this encounter.     Glenetta Hew, M.D., M.S. Interventional Cardiologist   Pager # [phone] Phone # [phone] [address] [place], [place]  [address]   Thank you for choosing [facility] at [facility]!!

## 2018-02-08 ENCOUNTER — Encounter: Payer: Self-pay | Admitting: Cardiology

## 2018-02-08 ENCOUNTER — Telehealth: Payer: Self-pay | Admitting: Cardiology

## 2018-02-08 DIAGNOSIS — I10 Essential (primary) hypertension: Secondary | ICD-10-CM

## 2018-02-08 MED ORDER — LABETALOL HCL 200 MG PO TABS: 100.0000 mg | ORAL_TABLET | Freq: Every day | ORAL | 6 refills | Status: DC

## 2018-02-08 NOTE — Assessment & Plan Note (Signed)
her dyspnea does seem to be related to deconditioning as she had not really been all that active before.  Trying to get back to doing exercise program.

## 2018-02-08 NOTE — Assessment & Plan Note (Signed)
Is nearing the end of the [place] trial -study coordinators are following labs.  Is also taking pravastatin.  Hope to have the blinded results opened up in May.  At which time if effective, she can stay on the study medication.

## 2018-02-08 NOTE — Telephone Encounter (Signed)
Spoke with pt dtr, she called to let dr harding know after taking the first dose of labetalol today her bp dropped to 100. The patient feels woozy, tired and off balance. The patient is lying down and is drinking fluids. Will forward to dr harding to review and advise

## 2018-02-08 NOTE — Assessment & Plan Note (Signed)
Blood pressure looks pretty good, but with a heart rate of 95, we can probably restart her beta-blocker.  I had tried to convert her once from labetalol for some reason and always get put back to labetalol.  Eventually we will try to get her back to carvedilol.  Continue to hold ACE inhibitor and felodipine.

## 2018-02-08 NOTE — Telephone Encounter (Signed)
F/U Patient daughter calling back, patient reassured that she will receive a call back today

## 2018-02-08 NOTE — Telephone Encounter (Signed)
Cut BB dose in 1/2.  Glenetta Hew, MD

## 2018-02-08 NOTE — Telephone Encounter (Signed)
New message  Pt c/o medication issue:  1. Name of Medication: labetalol (NORMODYNE) 200 MG tablet  2. How are you currently taking this medication (dosage and times per day)? Take 1 tablet (200 mg total) by mouth daily.  3. Are you having a reaction (difficulty breathing--STAT)? yes  4. What is your medication issue? shes feeling sleepy and light headed, bp is 100/53   Pt c/o BP issue: STAT if pt c/o blurred vision, one-sided weakness or slurred speech  1. What are your last 5 BP readings? 100/53  2. Are you having any other symptoms (ex. Dizziness, headache, blurred vision, passed out)? Woozy, light headed and sleepy  3. What is your BP issue? After taking medication pt bp dropped to 100/53

## 2018-02-08 NOTE — Assessment & Plan Note (Signed)
FFR guided PCI of D2 with DES.  Angina symptoms are well controlled.  She is quite happy.  Hoping to get back into full exercise program. Plan: Continue aspirin plus Plavix. Continue pravastatin. We will start low-dose beta-blocker (labetalol) --we will try her standing dose of 200 mg twice daily, but this may be either too high or too low.  If too high, but cut in half, if too low would probably then have her go back on the ACE inhibitor as well.

## 2018-02-08 NOTE — Telephone Encounter (Signed)
Spoke with pt, her bp is up to 108/54 and she is feeling some better. She is aware dr harding is in the clinic and will be later before he can answer calls. She will wait to hear what to do next. Advised her not to give the labetalol until she hears from [place].

## 2018-02-08 NOTE — Telephone Encounter (Signed)
PER DR HARDING ,  CHANGE MEDICATION AND TAKE 1/2 OF 200 MG  LABETALOL    FOR NOW.  INFORMATION GIVEN TO JONNA, SHE VERBALIZED UNDERSTANDING AND WILL INFORM PATIENT. INSTRUCTION GIVEN TO TAKE AFTER BREAKFAST .  CHANGE ON MED LIST

## 2018-02-09 ENCOUNTER — Other Ambulatory Visit: Payer: Self-pay | Admitting: Family Medicine

## 2018-02-09 DIAGNOSIS — Z139 Encounter for screening, unspecified: Secondary | ICD-10-CM

## 2018-02-21 ENCOUNTER — Ambulatory Visit: Payer: Medicare Other | Admitting: Urology

## 2018-02-21 DIAGNOSIS — R351 Nocturia: Secondary | ICD-10-CM | POA: Diagnosis not present

## 2018-02-21 DIAGNOSIS — N3001 Acute cystitis with hematuria: Secondary | ICD-10-CM

## 2018-02-21 DIAGNOSIS — R350 Frequency of micturition: Secondary | ICD-10-CM

## 2018-02-22 DIAGNOSIS — G4733 Obstructive sleep apnea (adult) (pediatric): Secondary | ICD-10-CM | POA: Diagnosis not present

## 2018-02-22 DIAGNOSIS — J449 Chronic obstructive pulmonary disease, unspecified: Secondary | ICD-10-CM | POA: Diagnosis not present

## 2018-02-22 DIAGNOSIS — R0789 Other chest pain: Secondary | ICD-10-CM | POA: Diagnosis not present

## 2018-02-22 DIAGNOSIS — I10 Essential (primary) hypertension: Secondary | ICD-10-CM | POA: Diagnosis not present

## 2018-03-15 ENCOUNTER — Other Ambulatory Visit: Payer: Self-pay | Admitting: Cardiology

## 2018-03-21 ENCOUNTER — Ambulatory Visit
Admission: RE | Admit: 2018-03-21 | Discharge: 2018-03-21 | Disposition: A | Discharge status: Home / Self Care | Payer: Medicare Other | Source: Ambulatory Visit | Attending: Family Medicine | Admitting: Family Medicine

## 2018-03-21 DIAGNOSIS — Z139 Encounter for screening, unspecified: Secondary | ICD-10-CM

## 2018-03-21 DIAGNOSIS — Z1231 Encounter for screening mammogram for malignant neoplasm of breast: Secondary | ICD-10-CM | POA: Diagnosis not present

## 2018-04-19 ENCOUNTER — Other Ambulatory Visit: Payer: Self-pay | Admitting: Cardiology

## 2018-04-19 NOTE — Telephone Encounter (Signed)
REFILL 

## 2018-04-23 DIAGNOSIS — E1151 Type 2 diabetes mellitus with diabetic peripheral angiopathy without gangrene: Secondary | ICD-10-CM | POA: Diagnosis not present

## 2018-04-23 DIAGNOSIS — E1140 Type 2 diabetes mellitus with diabetic neuropathy, unspecified: Secondary | ICD-10-CM | POA: Diagnosis not present

## 2018-04-24 ENCOUNTER — Ambulatory Visit: Payer: Medicare Other | Admitting: Physician Assistant

## 2018-04-24 ENCOUNTER — Encounter: Payer: Self-pay | Admitting: Physician Assistant

## 2018-04-24 VITALS — BP 134/72 | HR 74 | Ht 59.0 in | Wt 179.6 lb

## 2018-04-24 DIAGNOSIS — G4733 Obstructive sleep apnea (adult) (pediatric): Secondary | ICD-10-CM

## 2018-04-24 DIAGNOSIS — E119 Type 2 diabetes mellitus without complications: Secondary | ICD-10-CM | POA: Diagnosis not present

## 2018-04-24 DIAGNOSIS — I2510 Atherosclerotic heart disease of native coronary artery without angina pectoris: Secondary | ICD-10-CM | POA: Diagnosis not present

## 2018-04-24 DIAGNOSIS — I10 Essential (primary) hypertension: Secondary | ICD-10-CM | POA: Diagnosis not present

## 2018-04-24 DIAGNOSIS — I209 Angina pectoris, unspecified: Secondary | ICD-10-CM

## 2018-04-24 DIAGNOSIS — E785 Hyperlipidemia, unspecified: Secondary | ICD-10-CM | POA: Diagnosis not present

## 2018-04-24 DIAGNOSIS — Z9989 Dependence on other enabling machines and devices: Secondary | ICD-10-CM

## 2018-04-24 MED ORDER — LABETALOL HCL 100 MG PO TABS: 100.0000 mg | ORAL_TABLET | Freq: Two times a day (BID) | ORAL | 11 refills | Status: DC

## 2018-04-24 NOTE — Patient Instructions (Signed)
Medication Instructions:  Increase: Labetalol 100 mg two times a day   Labwork: None  Testing/Procedures: None  Follow-Up: Your physician recommends that you schedule a follow-up appointment in: 3 months with Dr. Ellyn Hack   Any Other Special Instructions Will Be Listed Below (If Applicable).     If you need a refill on your cardiac medications before your next appointment, please call your pharmacy.

## 2018-04-24 NOTE — Progress Notes (Signed)
Cardiology Office Note    Date:  04/24/2018   ID:  Vickie [name], DOB 12-24-1946, MRN [id number]  PCP:  Sharilyn Sites, MD  Cardiologist:  Dr. Ellyn Hack  (former patient of Dr. Terance Ice and before that Dr. Janene Madeira)   Chief Complaint  Patient presents with  . Follow-up    medication changes from last visit, pt denies chest pains, swelling in hands/feet, pt does mention some SOB due to allergies    History of Present Illness:  Vickie [name] is a 72 y.o. female with PMH of CAD, COPD, HTN, HLD, DM II and OSA on CPAP. She had a long standing history of coronary artery disease, cardiac catheterization in 4098 complicated by coronary dissection of the nondominant RCA. This vessel was healed on follow-up cath in 2009. Last echocardiogram obtained on 08/03/2017 showed EF 60-65%, crit 20, mild AI. Last cardiac catheterization was on 09/18/2017, she underwent Synergy DES 2.5 x 12 mm stent to second diagonal, she continued to have 40% proximal LAD, 40% proximal left circumflex lesion. She presented back to the hospital on 10/04/2017 with recurrent chest pain. It occurred at rest while she was shopping at [place]. She was kept overnight in the hospital, serial enzyme was normal. She was treated for hypokalemia, and discharged on potassium supplement on the days when she take as needed Lasix.  I last saw the patient on 10/19/2017, she was doing well at the time.  She was on Lasix every Monday Wednesday and Friday.  She was seen by Dr. Ellyn Hack on 02/06/2018, she was started on low-dose labetalol 200 mg daily.  The dose was cut in half after her blood pressure dropped below 100 mmHg.  Patient presents today for cardiology office visit, her aspirin was discontinued between the last office visit due to acid reflux symptoms.  The discontinuation of aspirin has helped with her symptoms.  She denies any recent chest pain, lower extremity edema, orthopnea or PND.  Based on her blood pressure diary, her  blood pressure would be elevated in the 150s in the morning however dropped back down to 120-130s by afternoon.  Interestingly she is only taking 100 mg daily of labetalol every morning, this is a BID drug, I will increase the labetalol to 100 mg twice daily to make her blood pressure more even.  Otherwise she can follow-up with Dr. Ellyn Hack in 3 months.   Past Medical History:  Diagnosis Date  . Anginal pain ([place])   . Anxiety   . Anxiety disorder    With apparent panic attacks  . Arthritis    "hands, knees, back" (09/18/2017)  . CAD S/P percutaneous coronary angioplasty 08/2017   Single-vessel CAD involving second diagonal branch treated with DES stent Synergy 2.25 mm x 12 mm (2.4 mm)  . Chronic back pain    "all over" (09/18/2017)  . COPD (chronic obstructive pulmonary disease) ([place])    PFTs were done in 2008 at [facility]  . Depression   . Dyspnea   . Essential hypertension   . GERD (gastroesophageal reflux disease)   . Hiatal hernia   . History of left bundle branch block (LBBB) 08/2017   Rate Related LBBB noted in cath lab  . Hyperlipidemia   . Macular degeneration    right eye  . Myocardial infarction [facility]) 2007   "medically induced"  . Nonocclusive coronary atherosclerosis of native coronary artery 2006 through 2012   multi caths 2012, 2006, [id number] (9562 complicated by catheter-induced dissection of  small nondominant RCA, patent in 2009 with no residual abnormality); Myoview August 2013: LOW RISK, normal EF. ; Echocardiogram [name] - January 2014) moderate LVH, EF 55-65%. No significant valvular disease.  . OSA (obstructive sleep apnea) 9/25/ 2012   tested 2009; tetested sleep study 07/2011--titration 09/20/2011 now use Bi-PAP  . OSA on CPAP   . Pneumonia    "several times in 2017; 3 times already this year" (09/18/2017)  . Scoliosis   . Type II diabetes mellitus ([place])     Past Surgical History:  Procedure Laterality Date  . CARDIAC CATHETERIZATION  2202,5427; 2009     Non-occlusice CAD - only 80% ostial SP1;  NON DOMINANNT  RCA (catheter insuced dissection with MI in 2007 --> resolved by 2009 cath)  . CATARACT EXTRACTION, BILATERAL Bilateral 2017   Toric lens "in the left eye only"  . CORONARY STENT INTERVENTION N/A 09/18/2017   Procedure: CORONARY STENT INTERVENTION;  Surgeon: Leonie Man, MD;  Location: [facility] INVASIVE CV LAB: DES PCI Diag2: Synergy DES 2.25 mm x 12 mm (2.4 mm)  . DIAGNOSTIC LAPAROSCOPY Right   . DILATION AND CURETTAGE OF UTERUS    . EYE SURGERY    . INTRAVASCULAR PRESSURE WIRE/FFR STUDY N/A 09/18/2017   Procedure: INTRAVASCULAR PRESSURE WIRE/FFR STUDY;  Surgeon: Leonie Man, MD;  Location: [place] CV LAB;  Service: Cardiovascular: FFR Diag 2 -- 0.78 (significcant) --> PCI  . KNEE ARTHROSCOPY Right   . LEFT HEART CATH AND CORONARY ANGIOGRAPHY N/A 09/18/2017   Procedure: LEFT HEART CATH AND CORONARY ANGIOGRAPHY;  Surgeon: Leonie Man, MD;  Location: [facility] INVASIVE CV LAB: Culpril - 80% Diag2 (FFR 0.78)- PCI.  pLAD 40%, pCx 40%. Post PCI LBBB. LVEDP - ~20 mmHg. EF 55-60%  . LIPOMA EXCISION Right ~ 2015   anterior abdomen  . PARS PLANA VITRECTOMY W/ REPAIR OF MACULAR HOLE Right    Unsuccessful repair.  Hole filled  . VAGINAL HYSTERECTOMY      Current Medications: Outpatient Medications Prior to Visit  Medication Sig Dispense Refill  . albuterol (PROAIR HFA) 108 (90 BASE) MCG/ACT inhaler Inhale 1 puff into the lungs every 6 (six) hours as needed for wheezing or shortness of breath.    Marland Kitchen albuterol (PROVENTIL) (2.5 MG/3ML) 0.083% nebulizer solution Take 2.5 mg by nebulization every 6 (six) hours as needed for wheezing or shortness of breath.     . ALPRAZolam (XANAX) 0.5 MG tablet Take 0.25 mg by mouth 2 (two) times daily.     . AMBULATORY NON FORMULARY MEDICATION Inject 300 mg into the skin as directed. Medication Name:Inclisiran sodium 300 mg vs placebo    . Biotin w/ Vitamins C & E (HAIR/SKIN/NAILS PO) Take 1 tablet by mouth  at bedtime.    . busPIRone (BUSPAR) 15 MG tablet Take 15 mg by mouth 2 (two) times daily.    . Calcium Carb-Cholecalciferol (CALCIUM 600 + D PO) Take 1 tablet by mouth 2 (two) times daily.    . clopidogrel (PLAVIX) 75 MG tablet TAKE 1 TABLET DAILY WITH BREAKFAST. 90 tablet 3  . Coenzyme Q10 (COQ10) 100 MG CAPS Take 100 mg by mouth every morning.    Marland Kitchen CRANBERRY PO Take 1 capsule by mouth 2 (two) times daily.     Marland Kitchen escitalopram (LEXAPRO) 20 MG tablet Take 20 mg by mouth every morning.     . furosemide (LASIX) 20 MG tablet Take 1 tablet (20 mg total) by mouth every other day. 30 tablet 3  .  isosorbide mononitrate (IMDUR) 60 MG 24 hr tablet TAKE ONE TABLET BY MOUTH ONCE DAILY. (Patient taking differently: TAKE 60 mg  TABLET BY MOUTH ONCE DAILY.) 90 tablet 3  . mirtazapine (REMERON) 15 MG tablet Take 7.5 mg by mouth at bedtime.     . Multiple Vitamin (MULTIVITAMIN) tablet Take 1 tablet by mouth daily.    . nitroGLYCERIN (NITROSTAT) 0.4 MG SL tablet PLACE 1 TAB UNDER TONGUE EVERY 5 MIN IF NEEDED FOR CHEST PAIN. MAY USE 3 TIMES.NO RELIEF CALL 911. 25 tablet 5  . Omega-3 Fatty Acids (FISH OIL PO) Take 1 capsule by mouth at bedtime.    . pantoprazole (PROTONIX) 40 MG tablet TAKE ONE TABLET BY MOUTH ONCE DAILY. 30 tablet 6  . potassium chloride SA (K-DUR,KLOR-CON) 20 MEQ tablet Take 1 tablet (20 mEq total) by mouth every other day. Take on days that you take lasix. 30 tablet 3  . pravastatin (PRAVACHOL) 40 MG tablet TAKE ONE TABLET BY MOUTH DAILY. 30 tablet 11  . Tiotropium Bromide Monohydrate (SPIRIVA RESPIMAT) 2.5 MCG/ACT AERS Inhale 2.5 mcg into the lungs 2 (two) times daily.    Marland Kitchen aspirin EC 81 MG tablet Take 1 tablet (81 mg total) by mouth daily.    Marland Kitchen labetalol (NORMODYNE) 200 MG tablet Take 0.5 tablets (100 mg total) by mouth daily. 30 tablet 6  . CINNAMON PO Take 1 capsule by mouth 2 (two) times daily.     No facility-administered medications prior to visit.      Allergies:   Adhesive [tape];  Avelox [moxifloxacin]; Bactrim [sulfamethoxazole-trimethoprim]; Levaquin [levofloxacin]; Mucinex [guaifenesin er]; Penicillins; Sudafed [pseudoephedrine hcl]; Crestor [rosuvastatin]; Lipitor [atorvastatin]; Vytorin [ezetimibe-simvastatin]; and Norvasc [amlodipine]   Social History   Socioeconomic History  . Marital status: Married    Spouse name: Not on file  . Number of children: Not on file  . Years of education: Not on file  . Highest education level: Not on file  Occupational History  . Not on file  Social Needs  . Financial resource strain: Not on file  . Food insecurity:    Worry: Not on file    Inability: Not on file  . Transportation needs:    Medical: Not on file    Non-medical: Not on file  Tobacco Use  . Smoking status: Former Smoker    Packs/day: 3.00    Years: 8.50    Pack years: 25.50    Types: Cigarettes    Last attempt to quit: 04/18/1977    Years since quitting: 41.0  . Smokeless tobacco: Never Used  Substance and Sexual Activity  . Alcohol use: No  . Drug use: No  . Sexual activity: Not on file  Lifestyle  . Physical activity:    Days per week: Not on file    Minutes per session: Not on file  . Stress: Not on file  Relationships  . Social connections:    Talks on phone: Not on file    Gets together: Not on file    Attends religious service: Not on file    Active member of club or organization: Not on file    Attends meetings of clubs or organizations: Not on file    Relationship status: Not on file  Other Topics Concern  . Not on file  Social History Narrative   Married mother of 18, grandmother of 71. Her mother is 51 years old. Quit smoking 34 years ago. Does not drink alcohol.  Retired from [employer] in 2012.  Usually presents with oldest daughter.   Previously worked out at [employer] regularly walking 1/2-1 mile a day, but no longer able to do so because of the [employer] decision to no longer cover [employer] cost.      Family History:  The patient's family history includes Breast cancer in her maternal aunt and maternal aunt; CAD in her mother.   ROS:   Please see the history of present illness.    ROS All other systems reviewed and are negative.   PHYSICAL EXAM:   VS:  BP 134/72 (BP Location: Left Arm)   Pulse 74   Ht 4\' 11"  (1.499 m)   Wt 179 lb 9.6 oz (81.5 kg)   BMI 36.27 kg/m    GEN: Well nourished, well developed, in no acute distress  HEENT: normal  Neck: no JVD, carotid bruits, or masses Cardiac: RRR; no murmurs, rubs, or gallops,no edema  Respiratory:  clear to auscultation bilaterally, normal work of breathing GI: soft, nontender, nondistended, + BS MS: no deformity or atrophy  Skin: warm and dry, no rash Neuro:  Alert and Oriented x 3, Strength and sensation are intact Psych: euthymic mood, full affect  Wt Readings from Last 3 Encounters:  04/24/18 179 lb 9.6 oz (81.5 kg)  02/06/18 184 lb (83.5 kg)  01/16/18 177 lb (80.3 kg)      Studies/Labs Reviewed:   EKG:  EKG is ordered today.  The ekg ordered today demonstrates normal sinus rhythm without significant ST-T wave changes.  Recent Labs: 10/04/2017: ALT 19; Magnesium 1.7 10/05/2017: Hemoglobin 11.2; Platelets 353 10/19/2017: BUN 11; Creatinine, Ser 0.70; Potassium 4.5; Sodium 144   Lipid Panel    Component Value Date/Time   CHOL 217 (H) 02/10/2017 0843   TRIG 214 (H) 02/10/2017 0843   HDL 53 02/10/2017 0843   CHOLHDL 4.1 02/10/2017 0843   CHOLHDL 3.6 09/09/2016 1021   VLDL 32 (H) 09/09/2016 1021   LDLCALC 121 (H) 02/10/2017 0843    Additional studies/ records that were reviewed today include:   Echo 08/03/2017 LV EF: 60% -   65%  ------------------------------------------------------------------- Indications:      Dyspnea on exertion (R06.09).  ------------------------------------------------------------------- History:   PMH:   Coronary artery disease.  Risk factors:  LE edema. Former tobacco use.  Hypertension. Diabetes mellitus. Obese.   ------------------------------------------------------------------- Study Conclusions  - Left ventricle: The cavity size was normal. Systolic function was   normal. The estimated ejection fraction was in the range of 60%   to 65%. Wall motion was normal; there were no regional wall   motion abnormalities. Doppler parameters are consistent with   abnormal left ventricular relaxation (grade 1 diastolic   dysfunction). Doppler parameters are consistent with elevated   ventricular end-diastolic filling pressure. - Aortic valve: Trileaflet; normal thickness leaflets. There was   mild regurgitation. - Aortic root: The aortic root was normal in size. - Mitral valve: There was no regurgitation. - Left atrium: The atrium was normal in size. - Right ventricle: The cavity size was normal. Wall thickness was   normal. Systolic function was normal. - Tricuspid valve: There was no regurgitation. - Pulmonary arteries: Systolic pressure could not be accurately   estimated. - Inferior vena cava: The vessel was normal in size. - Pericardium, extracardiac: There was no pericardial effusion.   Cath 09/18/2017 Conclusion     2nd Diag lesion, 80 %stenosed. FFR 0.78.  A STENT SYNERGY DES 2.25X12 drug eluting stent was successfully placed. Postdilated to  perform a meter.  Post intervention, there is a 0% residual stenosis.  Prox LAD lesion, 40 %stenosed. Focal calcified lesion at the first of the perforator and small first diagonal branch.  Prox Cx lesion, 40 %stenosed.  The left ventricular systolic function is normal. The left ventricular ejection fraction is 55-65% by visual estimate.  LV end diastolic pressure is normal.  Rate Related LBBB (rate in mid 70s)   Single vessel CAD as predicted by CT FFR. Successful FFR guided PCI of the 2nd Diag Branch with DES.  Plan: Mostly because the patient's anxiety, we will monitor her tonight with planned  discharge tomorrow morning.  She will be on aspirin plus Plavix for minimum of 3 months at which time we can stop aspirin if necessary.  She has not been on a beta blocker in the past because of fatigue. -Would simply continue home medications.  She does have what appears to be a rate related left bundle branch block converting to left bundle in the 70s bpm     ASSESSMENT:    1. Coronary artery disease involving native coronary artery of native heart without angina pectoris   2. Essential hypertension   3. Hyperlipidemia, unspecified hyperlipidemia type   4. Controlled type 2 diabetes mellitus without complication, without long-term current use of insulin ([place])   5. OSA on CPAP      PLAN:  In order of problems listed above:  1. CAD: No recent chest pain, continue Plavix.  Aspirin was discontinued by Dr. Ellyn Hack during the last office visit due to worsening acid reflux symptoms.  Acid reflux has resolved since then.  Continue on Pravachol  2. Hypertension: After she was started on 200 mg daily of labetalol, blood pressure dropped significantly, labetalol has since been reduced down to 100 mg daily however her blood pressure will be elevated in the morning and a normal by afternoon.  Labetalol is a twice daily drug, therefore I will change labetalol to 100 mg twice daily to help control her blood pressure better.  3. Hyperlipidemia: On Pravachol.  She is due for fasting lipid panel. She is in [place] study  4. DM 2: Managed by primary care provider  5. Obstructive sleep apnea on CPAP: Continue current therapy    Medication Adjustments/Labs and Tests Ordered: Current medicines are reviewed at length with the patient today.  Concerns regarding medicines are outlined above.  Medication changes, Labs and Tests ordered today are listed in the Patient Instructions below. Patient Instructions  Medication Instructions:  Increase: Labetalol 100 mg two times a day    Labwork: None  Testing/Procedures: None  Follow-Up: Your physician recommends that you schedule a follow-up appointment in: 3 months with Dr. Ellyn Hack   Any Other Special Instructions Will Be Listed Below (If Applicable).     If you need a refill on your cardiac medications before your next appointment, please call your pharmacy.      Hilbert Corrigan, [place]  04/24/2018 1:44 PM    [facility] [address], [place], [place]  [address] Phone: [phone]; Fax: [phone]

## 2018-04-25 ENCOUNTER — Other Ambulatory Visit (INDEPENDENT_AMBULATORY_CARE_PROVIDER_SITE_OTHER): Payer: Medicare Other

## 2018-04-25 DIAGNOSIS — I209 Angina pectoris, unspecified: Secondary | ICD-10-CM

## 2018-04-25 DIAGNOSIS — I10 Essential (primary) hypertension: Secondary | ICD-10-CM

## 2018-05-10 DIAGNOSIS — H35341 Macular cyst, hole, or pseudohole, right eye: Secondary | ICD-10-CM | POA: Diagnosis not present

## 2018-05-10 DIAGNOSIS — Z961 Presence of intraocular lens: Secondary | ICD-10-CM | POA: Diagnosis not present

## 2018-05-10 DIAGNOSIS — E119 Type 2 diabetes mellitus without complications: Secondary | ICD-10-CM | POA: Diagnosis not present

## 2018-05-10 DIAGNOSIS — H524 Presbyopia: Secondary | ICD-10-CM | POA: Diagnosis not present

## 2018-05-22 ENCOUNTER — Encounter: Payer: Medicare Other | Admitting: *Deleted

## 2018-05-22 VITALS — BP 141/61 | HR 56 | Temp 98.3°F | Resp 18 | Wt 181.0 lb

## 2018-05-22 DIAGNOSIS — Z006 Encounter for examination for normal comparison and control in clinical research program: Secondary | ICD-10-CM

## 2018-05-22 NOTE — Progress Notes (Signed)
Subject to research clinic for [id number] in the [employer] 10 trial. No c/o, aes or saes to report.  Injection given and subject remained in clinic for 30 minutes after injection per protocol.  Next appointment scheduled.

## 2018-05-24 DIAGNOSIS — J449 Chronic obstructive pulmonary disease, unspecified: Secondary | ICD-10-CM | POA: Diagnosis not present

## 2018-05-24 DIAGNOSIS — I10 Essential (primary) hypertension: Secondary | ICD-10-CM | POA: Diagnosis not present

## 2018-05-24 DIAGNOSIS — J301 Allergic rhinitis due to pollen: Secondary | ICD-10-CM | POA: Diagnosis not present

## 2018-05-24 DIAGNOSIS — G4733 Obstructive sleep apnea (adult) (pediatric): Secondary | ICD-10-CM | POA: Diagnosis not present

## 2018-06-18 DIAGNOSIS — H02054 Trichiasis without entropian left upper eyelid: Secondary | ICD-10-CM | POA: Diagnosis not present

## 2018-06-18 DIAGNOSIS — H02014 Cicatricial entropion of left upper eyelid: Secondary | ICD-10-CM | POA: Diagnosis not present

## 2018-06-18 DIAGNOSIS — H02055 Trichiasis without entropian left lower eyelid: Secondary | ICD-10-CM | POA: Diagnosis not present

## 2018-06-18 DIAGNOSIS — H02011 Cicatricial entropion of right upper eyelid: Secondary | ICD-10-CM | POA: Diagnosis not present

## 2018-06-27 ENCOUNTER — Ambulatory Visit: Payer: Medicare Other | Admitting: Urology

## 2018-06-27 DIAGNOSIS — N3001 Acute cystitis with hematuria: Secondary | ICD-10-CM

## 2018-06-27 DIAGNOSIS — R350 Frequency of micturition: Secondary | ICD-10-CM

## 2018-06-27 DIAGNOSIS — R351 Nocturia: Secondary | ICD-10-CM

## 2018-07-03 ENCOUNTER — Encounter: Payer: Medicare Other | Admitting: *Deleted

## 2018-07-03 VITALS — BP 135/71 | HR 78 | Temp 98.0°F | Resp 18 | Wt 181.3 lb

## 2018-07-03 DIAGNOSIS — Z006 Encounter for examination for normal comparison and control in clinical research program: Secondary | ICD-10-CM

## 2018-07-03 NOTE — Progress Notes (Signed)
Patient to research clinic for [id number] in the [facility] 10 research study. No cos, aes or saes to report. Next visit EOS scheduled and next phase [place] 8 discussed.

## 2018-07-13 DIAGNOSIS — Q681 Congenital deformity of finger(s) and hand: Secondary | ICD-10-CM | POA: Diagnosis not present

## 2018-07-13 DIAGNOSIS — Z0001 Encounter for general adult medical examination with abnormal findings: Secondary | ICD-10-CM | POA: Diagnosis not present

## 2018-07-13 DIAGNOSIS — E782 Mixed hyperlipidemia: Secondary | ICD-10-CM | POA: Diagnosis not present

## 2018-07-13 DIAGNOSIS — Z6836 Body mass index (BMI) 36.0-36.9, adult: Secondary | ICD-10-CM | POA: Diagnosis not present

## 2018-07-16 ENCOUNTER — Telehealth: Payer: Self-pay | Admitting: Cardiology

## 2018-07-16 DIAGNOSIS — E1140 Type 2 diabetes mellitus with diabetic neuropathy, unspecified: Secondary | ICD-10-CM | POA: Diagnosis not present

## 2018-07-16 DIAGNOSIS — E1151 Type 2 diabetes mellitus with diabetic peripheral angiopathy without gangrene: Secondary | ICD-10-CM | POA: Diagnosis not present

## 2018-07-16 DIAGNOSIS — J449 Chronic obstructive pulmonary disease, unspecified: Secondary | ICD-10-CM | POA: Diagnosis not present

## 2018-07-16 MED ORDER — POTASSIUM CHLORIDE CRYS ER 20 MEQ PO TBCR: 20.0000 meq | EXTENDED_RELEASE_TABLET | ORAL | 1 refills | Status: DC

## 2018-07-16 MED ORDER — FUROSEMIDE 20 MG PO TABS: 20.0000 mg | ORAL_TABLET | ORAL | 1 refills | Status: DC

## 2018-07-16 NOTE — Telephone Encounter (Signed)
°*  STAT* If patient is at the pharmacy, call can be transferred to refill team.   1. Which medications need to be refilled? (please list name of each medication and dose if known) Furosemide and Potassium  2. Which pharmacy/location (including street and city if local pharmacy) is medication to be sent to?[place]  3. Do they need a 30 day or 90 day supply? 30 and refills

## 2018-07-26 ENCOUNTER — Encounter: Payer: Self-pay | Admitting: Cardiology

## 2018-07-26 ENCOUNTER — Other Ambulatory Visit: Payer: Self-pay | Admitting: Cardiology

## 2018-07-26 ENCOUNTER — Ambulatory Visit: Payer: Medicare Other | Admitting: Cardiology

## 2018-07-26 VITALS — BP 125/70 | HR 61 | Ht 59.0 in | Wt 179.4 lb

## 2018-07-26 DIAGNOSIS — Z9861 Coronary angioplasty status: Secondary | ICD-10-CM

## 2018-07-26 DIAGNOSIS — I25119 Atherosclerotic heart disease of native coronary artery with unspecified angina pectoris: Secondary | ICD-10-CM | POA: Diagnosis not present

## 2018-07-26 DIAGNOSIS — I10 Essential (primary) hypertension: Secondary | ICD-10-CM

## 2018-07-26 DIAGNOSIS — I2510 Atherosclerotic heart disease of native coronary artery without angina pectoris: Secondary | ICD-10-CM

## 2018-07-26 DIAGNOSIS — E785 Hyperlipidemia, unspecified: Secondary | ICD-10-CM | POA: Diagnosis not present

## 2018-07-26 DIAGNOSIS — Z01818 Encounter for other preprocedural examination: Secondary | ICD-10-CM | POA: Diagnosis not present

## 2018-07-26 MED ORDER — PANTOPRAZOLE SODIUM 40 MG PO TBEC: 40.0000 mg | DELAYED_RELEASE_TABLET | Freq: Every day | ORAL | 3 refills | Status: DC

## 2018-07-26 NOTE — Telephone Encounter (Signed)
Rx sent to pharmacy   

## 2018-07-26 NOTE — Progress Notes (Signed)
PCP: Sharilyn Sites, MD  Clinic Note: Chief Complaint  Patient presents with  . Follow-up    CAD-PCI with no recurrent angina  . Pre-op Exam    Eyelid surgery  . Shortness of Breath    Related to COPD  . Leg Pain    cramping in legs at night.     HPI: Vickie [name] is a 72 y.o. female with a PMH below who presents today for ~5-6 month follow-up for history of CAD PCI of Diag branch in Sept 2018 for CP & + Abnormal Cardiac CTA (FFR + Diag).  She is being seen earlier today for preoperative evaluation for eyelid surgery.  Vickie [name] is a former Dr. Rollene Fare patient and before that Dr. Janene Madeira.  2007 : catheter induced coronary dissection of the nondominant RCA.    Every follow-up evaluation since had been fine up until last fall.    She also chronically has unusual chest pain episodes and dyspnea episodes.    She did very well until Sdept 2018 --> anginal symptoms and had an abnormal Coronary CTA that led to cardiac catheterization & Diag PCI.  Vickie [name] was last seen on 02/06/18 --> initially feeling poorly post PCI, she returned at this time feeling well with no recurrent symptoms of chest tightness or pressure.  Was exercising at the [facility], but being limited by knee pain.  Mild exertional dyspnea but flares up on her COPD flares up. GERD is controlled with PPI.  Recent Hospitalizations: none  Being evaluated for Eyelid issues - has pending surgery next month  Studies Personally Reviewed - (if available, images/films reviewed: From Epic Chart or Care Everywhere)   no new studies  Interval History: Vickie [name] presents here today stating that she feels "okay but no cardiac standpoint.  She does not think she has had any more of her angina type symptoms on the current dose of Imdur and labetalol.  She seems to be tolerating the protocol relatively well although does she does have some nighttime cramping. She says that she is more short of breath than usual right now, but  her COPD has been flaring up quite a bit of late.  She therefore is noted worsening exertional dyspnea, but no anginal chest pain associated.  Her blood pressures have been running in the normal range, and she is very happy about that.  No longer any headaches or blurred vision. She denies any rapid heartbeats or palpitations.  No syncope or near syncope, TIA or amaurosis fugax.  No melena, hematochezia, hematuria or epistaxis while on Plavix.  She has yet to have her colonoscopy, waiting until she is cleared to hold Plavix. She is supposed to have eyelid surgery for asking about preop evaluation for possible procedure next month.  Also notable is that her lipid trial is ending in August and she will then have revealed what what medicine she was taking.  We will also get the results as well.  Depending on that, we may need to just simply start her on medication if lipids are controlled.  The majority of today's visit, however, was revolving around significant social stressors and how this is affecting her health wise.  Basically, she has been having to be primary caregiver for her Vickie [name] who is 34 months old.  This is the grandson of her other daughter (the nurse that is not with her routine basis, and here today) who really did not play much of a role raising her 2 children.  Vickie [name]  actually pretty much raised the granddaughter that is the parent of this current child.  As it seems, the granddaughter is trying to go back to school and doing clinical rotations for her degree and has not been on December tablet child.  As such, Vickie [name]' husband volunteered her to take care of the baby.  The nurse daughter has been helping, but this is been quite a bit of stress and also quite a disappointment in her daughter and granddaughter.  Because of all the time she has been caring for her grandson, she has not been able to do a lot of the Exercises She Has Been Doing at the [facility].  She kind of got out of  the rhythm because of her knee pain and now has never got back to do it because of social requirements. She is trying to stay active with walking and is now limited because of COPD issues. She denies any PND, orthopnea or edema.  Occasional real palpitations but nothing significant. No syncope/near syncope or TIA/amaurosis fugax.  ROS: A comprehensive was performed.  Pertinent symptoms noted in HPI  Review of Systems  Constitutional: Negative for malaise/fatigue.  HENT: Negative for congestion and nosebleeds.   Respiratory: Positive for cough, shortness of breath and wheezing.        Per HPI  Gastrointestinal: Positive for heartburn (Usually controlled with Protonix but she is). Negative for abdominal pain, blood in stool, constipation, diarrhea and melena.  Genitourinary: Negative for dysuria and hematuria.  Musculoskeletal: Positive for joint pain. Negative for back pain.  Neurological: Positive for dizziness (Rarely now).  Psychiatric/Behavioral: The patient is nervous/anxious.        Lots of social stress as noted above.  All other systems reviewed and are negative.  I have reviewed and (if needed) personally updated the patient's problem list, medications, allergies, past medical and surgical history, social and family history.   Past Medical History:  Diagnosis Date  . Anginal pain ([place])   . Anxiety   . Anxiety disorder    With apparent panic attacks  . Arthritis    "hands, knees, back" (09/18/2017)  . CAD S/P percutaneous coronary angioplasty 08/2017   Single-vessel CAD involving second diagonal branch treated with DES stent Synergy 2.25 mm x 12 mm (2.4 mm)  . Chronic back pain    "all over" (09/18/2017)  . COPD (chronic obstructive pulmonary disease) ([place])    PFT[facility] were done in 2008 at [facility]  . Depression   . Dyspnea   . Essential hypertension   . GERD (gastroesophageal reflux disease)   . Hiatal hernia   . History of left bundle branch block (LBBB) 08/2017   Rate  Related LBBB noted in cath lab  . Hyperlipidemia   . Macular degeneration    right eye  . Myocardial infarction [facility]) 2007   "medically induced"  . Nonocclusive coronary atherosclerosis of native coronary artery 2006 through 2012   multi caths 2012, 2006, [id number] (8416 complicated by catheter-induced dissection of small nondominant RCA, patent in 2009 with no residual abnormality); Myoview August 2013: LOW RISK, normal EF. ; Echocardiogram [name] - January 2014) moderate LVH, EF 55-65%. No significant valvular disease.  . OSA (obstructive sleep apnea) 9/25/ 2012   tested 2009; tetested sleep study 07/2011--titration 09/20/2011 now use Bi-PAP  . OSA on CPAP   . Pneumonia    "several times in 2017; 3 times already this year" (09/18/2017)  . Scoliosis   . Type II diabetes mellitus ([place])  Past Surgical History:  Procedure Laterality Date  . CARDIAC CATHETERIZATION  [id number]; 2009   Non-occlusice CAD - only 80% ostial SP1;  NON DOMINANNT  RCA (catheter insuced dissection with MI in 2007 --> resolved by 2009 cath)  . CATARACT EXTRACTION, BILATERAL Bilateral 2017   Toric lens "in the left eye only"  . CORONARY STENT INTERVENTION N/A 09/18/2017   Procedure: CORONARY STENT INTERVENTION;  Surgeon: Leonie Man, MD;  Location: [facility] INVASIVE CV LAB: DES PCI Diag2: Synergy DES 2.25 mm x 12 mm (2.4 mm)  . DIAGNOSTIC LAPAROSCOPY Right   . DILATION AND CURETTAGE OF UTERUS    . EYE SURGERY    . INTRAVASCULAR PRESSURE WIRE/FFR STUDY N/A 09/18/2017   Procedure: INTRAVASCULAR PRESSURE WIRE/FFR STUDY;  Surgeon: Leonie Man, MD;  Location: [place] CV LAB;  Service: Cardiovascular: FFR Diag 2 -- 0.78 (significcant) --> PCI  . KNEE ARTHROSCOPY Right   . LEFT HEART CATH AND CORONARY ANGIOGRAPHY N/A 09/18/2017   Procedure: LEFT HEART CATH AND CORONARY ANGIOGRAPHY;  Surgeon: Leonie Man, MD;  Location: [facility] INVASIVE CV LAB: Culpril - 80% Diag2 (FFR 0.78)- PCI.  pLAD 40%, pCx 40%. Post PCI  LBBB. LVEDP - ~20 mmHg. EF 55-60%  . LIPOMA EXCISION Right ~ 2015   anterior abdomen  . PARS PLANA VITRECTOMY W/ REPAIR OF MACULAR HOLE Right    Unsuccessful repair.  Hole filled  . VAGINAL HYSTERECTOMY      Left Heart Catheter Coronary Angiography-PCI November 18, 2017 Single vessel CAD as per CT FFR. --> Successful FFR guided PCI of the Diag2 80% w/ Synergy DES 2.25 x 12 mm             Current Outpatient Medications on File Prior to Visit  Medication Sig Dispense Refill  . albuterol (PROAIR HFA) 108 (90 BASE) MCG/ACT inhaler Inhale 1 puff into the lungs every 6 (six) hours as needed for wheezing or shortness of breath.    Marland Kitchen albuterol (PROVENTIL) (2.5 MG/3ML) 0.083% nebulizer solution Take 2.5 mg by nebulization every 6 (six) hours as needed for wheezing or shortness of breath.     . ALPRAZolam (XANAX) 0.5 MG tablet Take 0.25 mg by mouth 2 (two) times daily.     . AMBULATORY NON FORMULARY MEDICATION Inject 300 mg into the skin as directed. Medication Name:Inclisiran sodium 300 mg vs placebo    . Biotin w/ Vitamins C & E (HAIR/SKIN/NAILS PO) Take 1 tablet by mouth at bedtime.    . busPIRone (BUSPAR) 15 MG tablet Take 15 mg by mouth 2 (two) times daily.    . Calcium Carb-Cholecalciferol (CALCIUM 600 + D PO) Take 1 tablet by mouth 2 (two) times daily.    . clopidogrel (PLAVIX) 75 MG tablet TAKE 1 TABLET DAILY WITH BREAKFAST. 90 tablet 3  . Coenzyme Q10 (COQ10) 100 MG CAPS Take 100 mg by mouth every morning.    Marland Kitchen CRANBERRY PO Take 1 capsule by mouth 2 (two) times daily.     Marland Kitchen escitalopram (LEXAPRO) 20 MG tablet Take 20 mg by mouth every morning.     . furosemide (LASIX) 20 MG tablet Take 1 tablet (20 mg total) by mouth every other day. 30 tablet 1  . isosorbide mononitrate (IMDUR) 60 MG 24 hr tablet TAKE ONE TABLET BY MOUTH ONCE DAILY. (Patient taking differently: TAKE 60 mg  TABLET BY MOUTH ONCE DAILY.) 90 tablet 3  . labetalol (NORMODYNE) 100 MG tablet Take 1 tablet (100 mg total) by  mouth 2 (  two) times daily. 60 tablet 11  . mirtazapine (REMERON) 15 MG tablet Take 7.5 mg by mouth at bedtime.     . Multiple Vitamin (MULTIVITAMIN) tablet Take 1 tablet by mouth daily.    . nitroGLYCERIN (NITROSTAT) 0.4 MG SL tablet PLACE 1 TAB UNDER TONGUE EVERY 5 MIN IF NEEDED FOR CHEST PAIN. MAY USE 3 TIMES.NO RELIEF CALL 911. 25 tablet 5  . Omega-3 Fatty Acids (FISH OIL PO) Take 1 capsule by mouth at bedtime.    . potassium chloride SA (K-DUR,KLOR-CON) 20 MEQ tablet Take 1 tablet (20 mEq total) by mouth every other day. Take on days that you take lasix. 30 tablet 1  . pravastatin (PRAVACHOL) 40 MG tablet TAKE ONE TABLET BY MOUTH DAILY. 30 tablet 11  . Tiotropium Bromide Monohydrate (SPIRIVA RESPIMAT) 2.5 MCG/ACT AERS Inhale 2.5 mcg into the lungs 2 (two) times daily.     No current facility-administered medications on file prior to visit.    Allergies  Allergen Reactions  . Adhesive [Tape] Other (See Comments)    REACTION: redness/irritation at application site. **Certain bandages/adhesives cause this reaction**  . Avelox [Moxifloxacin] Anaphylaxis  . Bactrim [Sulfamethoxazole-Trimethoprim] Anaphylaxis, Shortness Of Breath and Rash    REACTION: Choking, inability to swallow, redness  . Levaquin [Levofloxacin] Anaphylaxis, Shortness Of Breath, Rash and Other (See Comments)    Reaction:Choking Brand name Levaquin ok per pt  . Mucinex [Guaifenesin Er] Anaphylaxis, Shortness Of Breath and Rash  . Penicillins Other (See Comments)    "Passed out" Has patient had a PCN reaction causing immediate rash, facial/tongue/throat swelling, SOB or lightheadedness with hypotension: Unknown Has patient had a PCN reaction causing severe rash involving mucus membranes or skin necrosis: No Has patient had a PCN reaction that required hospitalization: No Has patient had a PCN reaction occurring within the last 10 years: No If all of the above answers are "NO", then may proceed with Cephalosporin use.     [place]bbie [place] [Pseudoephedrine Hcl] Shortness Of Breath, Rash and Other (See Comments)    REACTION: Choking, redness, inability to swallow  . Crestor [Rosuvastatin] Other (See Comments)    Leg pain  . Lipitor [Atorvastatin] Other (See Comments)    Leg pain  . Vytorin [Ezetimibe-Simvastatin] Other (See Comments)    Leg pain  . Norvasc [Amlodipine] Other (See Comments)    unknown    Social History   Tobacco Use  . Smoking status: Former Smoker    Packs/day: 3.00    Years: 8.50    Pack years: 25.50    Types: Cigarettes    Last attempt to quit: 04/18/1977    Years since quitting: 41.3  . Smokeless tobacco: Never Used  Substance Use Topics  . Alcohol use: No  . Drug use: No   Social History   Social History Narrative   Married mother of 4, grandmother of 57. Her mother is 25 years old. Quit smoking 34 years ago. Does not drink alcohol.  Retired from [employer] in 2012.   Usually presents with oldest daughter.   Previously worked out at [employer] regularly walking 1/2-1 mile a day, but no longer able to do so because of the [employer] decision to no longer cover [employer] cost.    family history includes Breast cancer in her maternal aunt and maternal aunt; CAD in her mother.  Wt Readings from Last 3 Encounters:  07/26/18 179 lb 6.4 oz (81.4 kg)  07/03/18 181 lb 4.8 oz (82.2 kg)  05/22/18 181 lb (82.1 kg)    PHYSICAL EXAM BP 125/70   Pulse 61   Ht 4\' 11"  (1.499 m)   Wt 179 lb 6.4 oz (81.4 kg)   BMI 36.23 kg/m  Physical Exam  Constitutional: She is oriented to person, place, and time. She appears well-developed and well-nourished. No distress.  Healthy-appearing.  Well-groomed  HENT:  Head: Normocephalic and atraumatic.  Neck: No hepatojugular reflux and no JVD present. Carotid bruit is not present.  Cardiovascular: Normal rate, regular rhythm and intact distal pulses.  Occasional extrasystoles are present. PMI is not displaced. Exam reveals  no gallop and no friction rub.  Murmur heard.  Medium-pitched harsh crescendo-decrescendo early systolic murmur is present at the upper right sternal border radiating to the neck. Pulmonary/Chest: Effort normal and breath sounds normal. No respiratory distress. She has no wheezes. She has no rales. She exhibits no tenderness.  Mild diffuse interstitial sounds, crackles but no rales or rhonchi  Abdominal: Soft. Bowel sounds are normal. She exhibits no distension. There is no tenderness.  Musculoskeletal: Normal range of motion. 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 She exhibits no edema.  Neurological: She is alert and oriented to person, place, and time.  Skin: Skin is warm and dry.  Psychiatric: She has a normal mood and affect. Her behavior is normal. Judgment and thought content normal.  Nursing note and vitals reviewed.   Adult ECG Report Not checked  Other studies Reviewed: Additional studies/ records that were reviewed today include:  Recent Labs:  --As her cardiologist, I am also blinded that she is labs have been ordered, but results not available.  Is due --> to complete the trial in August of this year (2019).  They will do reveal what medicine she is been on see results. Lab Results  Component Value Date   CHOL 217 (H) 02/10/2017   HDL 53 02/10/2017   LDLCALC 121 (H) 02/10/2017   TRIG 214 (H) 02/10/2017   CHOLHDL 4.1 02/10/2017    ASSESSMENT / PLAN: Problem List Items Addressed This Visit    CAD S/P percutaneous coronary angioplasty (Chronic)    S/p FFR-guided PCI of Diag 2 with DES. - now on Plavix alone without ASA.  PRE-OP NOTE  Since her PCI was not in the setting of ACS event, with a new generation DES stent, she is probably okay now 10 months out holding her Plavix 5 days prior to her surgery.   Forward this note to her surgeon.      Coronary artery disease involving native coronary  artery of native heart with angina pectoris ([place]) (Chronic)    After multiple cath dating back to 2002-2009  with the only abnormality being a catheter induced dissection of the small, nondominant RCA, she finally had an abnormal finding in 2018.    She had PCI of diagonal branch, and now is not having any further anginal symptoms on current dose of Imdur and labetalol.  He is now on  Plavix without aspirin. She is on beta-blocker and statin.        Essential hypertension (Chronic)    Blood pressure is well controlled.  She does not appear to be on the ACE inhibitor.  She is on labetalol at low-dose which helps her palpitations along with Imdur for angina.      Hyperlipidemia LDL goal <70 (Chronic)    Reaching the end of her current Orion trial.  Also on pravastatin.  Hope now get the blind results in August as opposed to made.  Hopefully controlled, we would maintain her on the study medication.      Pre-op evaluation - Primary    He is now just about 10 months out from her PCI for anginal symptoms, but not ACS. Recent guidelines for recurrent DES stents suggested would be reasonable to hold/suspend Plavix for procedures after 6 months.  Because it was a small stent I want to wait a little longer, but I think at this point she is okay to hold Plavix 5 days preop at this point. She could also hold it for planned colonoscopy that has been delayed.  She does not have any active angina or heart failure symptoms.  She has normal renal function, and does not have history of having stroke.  She does have a history of diabetes, but is controlled without medications (not on insulin).  Eyelid surgery is low risk.   She would be considered LOW RISK PATIENT FOR LOW RISK SURGERY.    No further cardiac evaluation is warranted.  Hold Plavix 5 days preop         Current medicines are reviewed at length with the patient today. (+/- concerns)  -cholesterol medicine trial and is in August.  Which  treatment will be revealed.  Results will be revealed. The following changes have been made: None  Patient Instructions  Your physician wants you to follow-up in: [place] will receive a reminder letter in the mail two months in advance. If you don't receive a letter, please call our office to schedule the follow-up appointment.   If you need a refill on your cardiac medications before your next appointment, please call your pharmacy.   LOW RISK PATIENT FOR LOW RISK SURGERY (Eyelid Surgery). OK TO HOLD PLAVIX 5 D Pre-op.   Studies Ordered:   No orders of the defined types were placed in this encounter.     Glenetta Hew, M.D., M.S. Interventional Cardiologist   Pager # [phone] Phone # [phone] [address] [place], [place] [address]   Thank you for choosing Heartcare at [facility]!!

## 2018-07-26 NOTE — Patient Instructions (Addendum)
Your physician wants you to follow-up in: [place] will receive a reminder letter in the mail two months in advance. If you don't receive a letter, please call our office to schedule the follow-up appointment.   If you need a refill on your cardiac medications before your next appointment, please call your pharmacy.   LOW RISK PATIENT FOR LOW RISK SURGERY (Eyelid Surgery). OK TO HOLD PLAVIX 5 D Pre-op.

## 2018-07-27 ENCOUNTER — Encounter: Payer: Self-pay | Admitting: Cardiology

## 2018-07-27 NOTE — Assessment & Plan Note (Signed)
S/p FFR-guided PCI of Diag 2 with DES. - now on Plavix alone without ASA.  PRE-OP NOTE  Since her PCI was not in the setting of ACS event, with a new generation DES stent, she is probably okay now 10 months out holding her Plavix 5 days prior to her surgery.   Forward this note to her surgeon.

## 2018-07-27 NOTE — Assessment & Plan Note (Signed)
Reaching the end of her current Orion trial.  Also on pravastatin.  Hope now get the blind results in August as opposed to made.  Hopefully controlled, we would maintain her on the study medication.

## 2018-07-27 NOTE — Assessment & Plan Note (Addendum)
Blood pressure is well controlled.  She does not appear to be on the ACE inhibitor.  She is on labetalol at low-dose which helps her palpitations along with Imdur for angina.

## 2018-07-27 NOTE — Assessment & Plan Note (Addendum)
He is now just about 10 months out from her PCI for anginal symptoms, but not ACS. Recent guidelines for recurrent DES stents suggested would be reasonable to hold/suspend Plavix for procedures after 6 months.  Because it was a small stent I want to wait a little longer, but I think at this point she is okay to hold Plavix 5 days preop at this point. She could also hold it for planned colonoscopy that has been delayed.  She does not have any active angina or heart failure symptoms.  She has normal renal function, and does not have history of having stroke.  She does have a history of diabetes, but is controlled without medications (not on insulin).  Eyelid surgery is low risk.   She would be considered LOW RISK PATIENT FOR LOW RISK SURGERY.    No further cardiac evaluation is warranted.  Hold Plavix 5 days preop

## 2018-07-27 NOTE — Assessment & Plan Note (Addendum)
After multiple cath dating back to 2002-2009  with the only abnormality being a catheter induced dissection of the small, nondominant RCA, she finally had an abnormal finding in 2018.    She had PCI of diagonal branch, and now is not having any further anginal symptoms on current dose of Imdur and labetalol.  He is now on  Plavix without aspirin. She is on beta-blocker and statin.

## 2018-08-07 DIAGNOSIS — H02014 Cicatricial entropion of left upper eyelid: Secondary | ICD-10-CM | POA: Diagnosis not present

## 2018-08-07 DIAGNOSIS — H02054 Trichiasis without entropian left upper eyelid: Secondary | ICD-10-CM | POA: Diagnosis not present

## 2018-08-07 DIAGNOSIS — H02011 Cicatricial entropion of right upper eyelid: Secondary | ICD-10-CM | POA: Diagnosis not present

## 2018-08-07 DIAGNOSIS — H02015 Cicatricial entropion of left lower eyelid: Secondary | ICD-10-CM | POA: Diagnosis not present

## 2018-08-07 DIAGNOSIS — H02135 Senile ectropion of left lower eyelid: Secondary | ICD-10-CM | POA: Diagnosis not present

## 2018-08-15 ENCOUNTER — Other Ambulatory Visit: Payer: Self-pay

## 2018-08-15 VITALS — BP 103/63 | HR 57 | Resp 20 | Ht 59.0 in | Wt 180.0 lb

## 2018-08-15 DIAGNOSIS — Z006 Encounter for examination for normal comparison and control in clinical research program: Secondary | ICD-10-CM

## 2018-08-15 NOTE — Progress Notes (Signed)
Vickie [name] to research clinic for visit V9EOS in the [place] 10 study.  No complaints, adverse events, or serious adverse events to report.     Subject met inclusion and exclusion criteria. The informed consent form, study requirements and expectations were reviewed with the subject and questions and concerns were addressed prior to the signing of the consent form.  The subject verbalized understanding of the trial requirements.  The subject agreed to participate in the [place] 8  trial and signed the informed consent.  The informed consent was obtained prior to performance of any protocol-specific procedures for the subject.  A copy of the signed informed consent was given to the subject and a copy was placed in the subject's medical record.   Study Entry-Day1 in [place] 8 study. Subject given injection and observed in clinic for 30 minutes per protocol.  Next appointment scheduled.

## 2018-08-16 DIAGNOSIS — Z09 Encounter for follow-up examination after completed treatment for conditions other than malignant neoplasm: Secondary | ICD-10-CM | POA: Diagnosis not present

## 2018-08-16 DIAGNOSIS — H02011 Cicatricial entropion of right upper eyelid: Secondary | ICD-10-CM | POA: Diagnosis not present

## 2018-08-16 DIAGNOSIS — H02051 Trichiasis without entropian right upper eyelid: Secondary | ICD-10-CM | POA: Diagnosis not present

## 2018-08-16 DIAGNOSIS — H02052 Trichiasis without entropian right lower eyelid: Secondary | ICD-10-CM | POA: Diagnosis not present

## 2018-08-23 DIAGNOSIS — J301 Allergic rhinitis due to pollen: Secondary | ICD-10-CM | POA: Diagnosis not present

## 2018-08-23 DIAGNOSIS — I10 Essential (primary) hypertension: Secondary | ICD-10-CM | POA: Diagnosis not present

## 2018-08-23 DIAGNOSIS — G4733 Obstructive sleep apnea (adult) (pediatric): Secondary | ICD-10-CM | POA: Diagnosis not present

## 2018-08-23 DIAGNOSIS — J449 Chronic obstructive pulmonary disease, unspecified: Secondary | ICD-10-CM | POA: Diagnosis not present

## 2018-09-04 ENCOUNTER — Other Ambulatory Visit: Payer: Self-pay | Admitting: Cardiology

## 2018-09-06 DIAGNOSIS — E119 Type 2 diabetes mellitus without complications: Secondary | ICD-10-CM | POA: Diagnosis not present

## 2018-09-06 DIAGNOSIS — M1991 Primary osteoarthritis, unspecified site: Secondary | ICD-10-CM | POA: Diagnosis not present

## 2018-09-06 DIAGNOSIS — Z681 Body mass index (BMI) 19 or less, adult: Secondary | ICD-10-CM | POA: Diagnosis not present

## 2018-09-06 DIAGNOSIS — J329 Chronic sinusitis, unspecified: Secondary | ICD-10-CM | POA: Diagnosis not present

## 2018-09-10 ENCOUNTER — Encounter (INDEPENDENT_AMBULATORY_CARE_PROVIDER_SITE_OTHER): Payer: Self-pay | Admitting: *Deleted

## 2018-09-17 DIAGNOSIS — E1140 Type 2 diabetes mellitus with diabetic neuropathy, unspecified: Secondary | ICD-10-CM | POA: Diagnosis not present

## 2018-09-17 DIAGNOSIS — E1151 Type 2 diabetes mellitus with diabetic peripheral angiopathy without gangrene: Secondary | ICD-10-CM | POA: Diagnosis not present

## 2018-09-19 DIAGNOSIS — H53489 Generalized contraction of visual field, unspecified eye: Secondary | ICD-10-CM | POA: Diagnosis not present

## 2018-10-04 DIAGNOSIS — Z23 Encounter for immunization: Secondary | ICD-10-CM | POA: Diagnosis not present

## 2018-10-11 ENCOUNTER — Encounter (INDEPENDENT_AMBULATORY_CARE_PROVIDER_SITE_OTHER): Payer: Self-pay | Admitting: *Deleted

## 2018-10-11 ENCOUNTER — Other Ambulatory Visit (INDEPENDENT_AMBULATORY_CARE_PROVIDER_SITE_OTHER): Payer: Self-pay | Admitting: *Deleted

## 2018-10-11 ENCOUNTER — Telehealth (INDEPENDENT_AMBULATORY_CARE_PROVIDER_SITE_OTHER): Payer: Self-pay | Admitting: *Deleted

## 2018-10-11 ENCOUNTER — Telehealth: Payer: Self-pay | Admitting: Cardiology

## 2018-10-11 DIAGNOSIS — Z1211 Encounter for screening for malignant neoplasm of colon: Secondary | ICD-10-CM

## 2018-10-11 MED ORDER — SUPREP BOWEL PREP KIT 17.5-3.13-1.6 GM/177ML PO SOLN: 1.0000 | Freq: Once | ORAL | 0 refills | Status: AC

## 2018-10-11 NOTE — Telephone Encounter (Signed)
New Message      [facility] Pre-operative Risk Assessment    Request for surgical clearance:  1. What type of surgery is being performed? Colonoscopy  2. When is this surgery scheduled? 11/09/2018  3. What type of clearance is required (medical clearance vs. Pharmacy clearance to hold med vs. Both)? Both   4. Are there any medications that need to be held prior to surgery and how long? Hold Plavix 5 days before   5. Practice name and name of physician performing surgery? [facility]  6. What is your office phone number [phone]   7.   What is your office fax number [phone]  8.   Anesthesia type (None, local, MAC, general) ? Propofol monitored.   Vickie [name] 10/11/2018, 2:52 PM  _________________________________________________________________   (provider comments below)

## 2018-10-11 NOTE — Telephone Encounter (Signed)
Patient needs suprep 

## 2018-10-11 NOTE — Telephone Encounter (Signed)
   Primary Cardiologist: Glenetta Hew, MD  Chart reviewed as part of pre-operative protocol coverage. Given past medical history and time since last visit, based on ACC/AHA guidelines, Vickie [name] would be at acceptable risk for the planned procedure without further cardiovascular testing. Getting > 4 mets of activity.   Patient is already cleared to holding Plavix x 5 day prior to colonoscopy by Dr.Harding during last office visit.    I will route this recommendation to the requesting party via Epic fax function and remove from pre-op pool.  Please call with questions.  [place], [place] 10/11/2018, 3:04 PM

## 2018-10-15 ENCOUNTER — Other Ambulatory Visit: Payer: Self-pay | Admitting: Cardiology

## 2018-10-15 ENCOUNTER — Telehealth (INDEPENDENT_AMBULATORY_CARE_PROVIDER_SITE_OTHER): Payer: Self-pay | Admitting: *Deleted

## 2018-10-15 NOTE — Telephone Encounter (Signed)
Referring MD/PCP: golding   Procedure: tcs with propofol   Reason/Indication:  screening  Has patient had this procedure before?  Yes, 2008  If so, when, by whom and where?    Is there a family history of colon cancer?  no  Who?  What age when diagnosed?    Is patient diabetic?   yes      Does patient have prosthetic heart valve or mechanical valve?  no  Do you have a pacemaker?  no  Has patient ever had endocarditis? no  Has patient had joint replacement within last 12 months?  no  Is patient constipated or do they take laxatives? no  Does patient have a history of alcohol/drug use?  no  Is patient on blood thinner such as Coumadin, Plavix and/or Aspirin? yes  Medications: plavix 75 mg daily, labetalol 100 mg bid, buspirone 12 mg bid, pantoprazole 40 mg daily, Remeron 15 mg daily, xanax 0.5 mg daily, pravastatin 40 mg daily, Lexapro 20 mg daily, Imdur 60 mg daily, potassium 20 mg 1 tab every other day, lasix 20 mg 1 tab every other day  Allergies: bactrim, avelox, sudafed  Medication Adjustment per Dr Lindi Adie, NP: plavix 5 days  Procedure date & time: 11/09/18 at 225 preop 11/8 at 10

## 2018-10-15 NOTE — Telephone Encounter (Signed)
agree

## 2018-10-29 NOTE — Patient Instructions (Signed)
Vickie [name]  10/29/2018     @PREFPERIOPPHARMACY @   Your procedure is scheduled on  11/09/2018 .  Report to [facility] at  1300  P.M.  Call this number if you have problems the morning of surgery:  [phone]   Remember:  Follow the diet and prep instructions given to you by Dr Olevia Perches office.                   Take these medicines the morning of surgery with A SIP OF WATER  Buspar, lexapro, isosorbide, labetolol, protonix. Use your inhalers and your nebulizer before you come.    Do not wear jewelry, make-up or nail polish.  Do not wear lotions, powders, or perfumes, or deodorant.  Do not shave 48 hours prior to surgery.  Men may shave face and neck.  Do not bring valuables to the hospital.  [facility] is not responsible for any belongings or valuables.  Contacts, dentures or bridgework may not be worn into surgery.  Leave your suitcase in the car.  After surgery it may be brought to your room.  For patients admitted to the hospital, discharge time will be determined by your treatment team.  Patients discharged the day of surgery will not be allowed to drive home.   Name and phone number of your driver:   family Special instructions:  None  Please read over the following fact sheets that you were given. Anesthesia Post-op Instructions and Care and Recovery After Surgery       Colonoscopy, Adult A colonoscopy is an exam to look at the large intestine. It is done to check for problems, such as:  Lumps (tumors).  Growths (polyps).  Swelling (inflammation).  Bleeding.  What happens before the procedure? Eating and drinking Follow instructions from your doctor about eating and drinking. These instructions may include:  A few days before the procedure - follow a low-fiber diet. ? Avoid nuts. ? Avoid seeds. ? Avoid dried fruit. ? Avoid raw fruits. ? Avoid vegetables.  1-3 days before the procedure - follow a clear liquid diet. Avoid  liquids that have red or purple dye. Drink only clear liquids, such as: ? Clear broth or bouillon. ? Black coffee or tea. ? Clear juice. ? Clear soft drinks or sports drinks. ? Gelatin dessert. ? Popsicles.  On the day of the procedure - do not eat or drink anything during the 2 hours before the procedure.  Bowel prep If you were prescribed an oral bowel prep:  Take it as told by your doctor. Starting the day before your procedure, you will need to drink a lot of liquid. The liquid will cause you to poop (have bowel movements) until your poop is almost clear or light green.  If your skin or butt gets irritated from diarrhea, you may: ? Wipe the area with wipes that have medicine in them, such as adult wet wipes with aloe and vitamin E. ? Put something on your skin that soothes the area, such as petroleum jelly.  If you throw up (vomit) while drinking the bowel prep, take a break for up to 60 minutes. Then begin the bowel prep again. If you keep throwing up and you cannot take the bowel prep without throwing up, call your doctor.  General instructions  Ask your doctor about changing or stopping your normal medicines. This is important if you take diabetes medicines or blood thinners.  Plan  to have someone take you home from the hospital or clinic. What happens during the procedure?  An IV tube may be put into one of your veins.  You will be given medicine to help you relax (sedative).  To reduce your risk of infection: ? Your doctors will wash their hands. ? Your anal area will be washed with soap.  You will be asked to lie on your side with your knees bent.  Your doctor will get a long, thin, flexible tube ready. The tube will have a camera and a light on the end.  The tube will be put into your anus.  The tube will be gently put into your large intestine.  Air will be delivered into your large intestine to keep it open. You may feel some pressure or cramping.  The  camera will be used to take photos.  A small tissue sample may be removed from your body to be looked at under a microscope (biopsy). If any possible problems are found, the tissue will be sent to a lab for testing.  If small growths are found, your doctor may remove them and have them checked for cancer.  The tube that was put into your anus will be slowly removed. The procedure may vary among doctors and hospitals. What happens after the procedure?  Your doctor will check on you often until the medicines you were given have worn off.  Do not drive for 24 hours after the procedure.  You may have a small amount of blood in your poop.  You may pass gas.  You may have mild cramps or bloating in your belly (abdomen).  It is up to you to get the results of your procedure. Ask your doctor, or the department performing the procedure, when your results will be ready. This information is not intended to replace advice given to you by your health care provider. Make sure you discuss any questions you have with your health care provider. Document Released: 01/14/2011 Document Revised: 10/12/2016 Document Reviewed: 02/23/2016 Elsevier Interactive Patient Education  2017 [employer]  Colonoscopy, Adult, Care After This sheet gives you information about how to care for yourself after your procedure. Your health care provider may also give you more specific instructions. If you have problems or questions, contact your health care provider. What can I expect after the procedure? After the procedure, it is common to have:  A small amount of blood in your stool for 24 hours after the procedure.  Some gas.  Mild abdominal cramping or bloating.  Follow these instructions at home: General instructions   For the first 24 hours after the procedure: ? Do not drive or use machinery. ? Do not sign important documents. ? Do not drink alcohol. ? Do your regular daily activities at a slower pace  than normal. ? Eat soft, easy-to-digest foods. ? Rest often.  Take over-the-counter or prescription medicines only as told by your health care provider.  It is up to you to get the results of your procedure. Ask your health care provider, or the department performing the procedure, when your results will be ready. Relieving cramping and bloating  Try walking around when you have cramps or feel bloated.  Apply heat to your abdomen as told by your health care provider. Use a heat source that your health care provider recommends, such as a moist heat pack or a heating pad. ? Place a towel between your skin and the heat source. ? Leave the  heat on for 20-30 minutes. ? Remove the heat if your skin turns bright red. This is especially important if you are unable to feel pain, heat, or cold. You may have a greater risk of getting burned. Eating and drinking  Drink enough fluid to keep your urine clear or pale yellow.  Resume your normal diet as instructed by your health care provider. Avoid heavy or fried foods that are hard to digest.  Avoid drinking alcohol for as long as instructed by your health care provider. Contact a health care provider if:  You have blood in your stool 2-3 days after the procedure. Get help right away if:  You have more than a small spotting of blood in your stool.  You pass large blood clots in your stool.  Your abdomen is swollen.  You have nausea or vomiting.  You have a fever.  You have increasing abdominal pain that is not relieved with medicine. This information is not intended to replace advice given to you by your health care provider. Make sure you discuss any questions you have with your health care provider. Document Released: 07/26/2004 Document Revised: 09/05/2016 Document Reviewed: 02/23/2016 Elsevier Interactive Patient Education  2018 [place] Anesthesia is a term that refers to techniques, procedures, and  medicines that help a person stay safe and comfortable during a medical procedure. Monitored anesthesia care, or sedation, is one type of anesthesia. Your anesthesia specialist may recommend sedation if you will be having a procedure that does not require you to be unconscious, such as:  Cataract surgery.  A dental procedure.  A biopsy.  A colonoscopy.  During the procedure, you may receive a medicine to help you relax (sedative). There are three levels of sedation:  Mild sedation. At this level, you may feel awake and relaxed. You will be able to follow directions.  Moderate sedation. At this level, you will be sleepy. You may not remember the procedure.  Deep sedation. At this level, you will be asleep. You will not remember the procedure.  The more medicine you are given, the deeper your level of sedation will be. Depending on how you respond to the procedure, the anesthesia specialist may change your level of sedation or the type of anesthesia to fit your needs. An anesthesia specialist will monitor you closely during the procedure. Let your health care provider know about:  Any allergies you have.  All medicines you are taking, including vitamins, herbs, eye drops, creams, and over-the-counter medicines.  Any use of steroids (by mouth or as a cream).  Any problems you or family members have had with sedatives and anesthetic medicines.  Any blood disorders you have.  Any surgeries you have had.  Any medical conditions you have, such as sleep apnea.  Whether you are pregnant or may be pregnant.  Any use of cigarettes, alcohol, or street drugs. What are the risks? Generally, this is a safe procedure. However, problems may occur, including:  Getting too much medicine (oversedation).  Nausea.  Allergic reaction to medicines.  Trouble breathing. If this happens, a breathing tube may be used to help with breathing. It will be removed when you are awake and breathing on  your own.  Heart trouble.  Lung trouble.  Before the procedure Staying hydrated Follow instructions from your health care provider about hydration, which may include:  Up to 2 hours before the procedure - you may continue to drink clear liquids, such as water, clear fruit juice,  black coffee, and plain tea.  Eating and drinking restrictions Follow instructions from your health care provider about eating and drinking, which may include:  8 hours before the procedure - stop eating heavy meals or foods such as meat, fried foods, or fatty foods.  6 hours before the procedure - stop eating light meals or foods, such as toast or cereal.  6 hours before the procedure - stop drinking milk or drinks that contain milk.  2 hours before the procedure - stop drinking clear liquids.  Medicines Ask your health care provider about:  Changing or stopping your regular medicines. This is especially important if you are taking diabetes medicines or blood thinners.  Taking medicines such as aspirin and ibuprofen. These medicines can thin your blood. Do not take these medicines before your procedure if your health care provider instructs you not to.  Tests and exams  You will have a physical exam.  You may have blood tests done to show: ? How well your kidneys and liver are working. ? How well your blood can clot.  General instructions  Plan to have someone take you home from the hospital or clinic.  If you will be going home right after the procedure, plan to have someone with you for 24 hours.  What happens during the procedure?  Your blood pressure, heart rate, breathing, level of pain and overall condition will be monitored.  An IV tube will be inserted into one of your veins.  Your anesthesia specialist will give you medicines as needed to keep you comfortable during the procedure. This may mean changing the level of sedation.  The procedure will be performed. After the  procedure  Your blood pressure, heart rate, breathing rate, and blood oxygen level will be monitored until the medicines you were given have worn off.  Do not drive for 24 hours if you received a sedative.  You may: ? Feel sleepy, clumsy, or nauseous. ? Feel forgetful about what happened after the procedure. ? Have a sore throat if you had a breathing tube during the procedure. ? Vomit. This information is not intended to replace advice given to you by your health care provider. Make sure you discuss any questions you have with your health care provider. Document Released: 09/07/2005 Document Revised: 05/20/2016 Document Reviewed: 04/03/2016 Elsevier Interactive Patient Education  2018 , Care After These instructions provide you with information about caring for yourself after your procedure. Your health care provider may also give you more specific instructions. Your treatment has been planned according to current medical practices, but problems sometimes occur. Call your health care provider if you have any problems or questions after your procedure. What can I expect after the procedure? After your procedure, it is common to:  Feel sleepy for several hours.  Feel clumsy and have poor balance for several hours.  Feel forgetful about what happened after the procedure.  Have poor judgment for several hours.  Feel nauseous or vomit.  Have a sore throat if you had a breathing tube during the procedure.  Follow these instructions at home: For at least 24 hours after the procedure:   Do not: ? Participate in activities in which you could fall or become injured. ? Drive. ? Use heavy machinery. ? Drink alcohol. ? Take sleeping pills or medicines that cause drowsiness. ? Make important decisions or sign legal documents. ? Take care of children on your own.  Rest. Eating and drinking  Follow the  diet that is recommended by your health  care provider.  If you vomit, drink water, juice, or soup when you can drink without vomiting.  Make sure you have little or no nausea before eating solid foods. General instructions  Have a responsible adult stay with you until you are awake and alert.  Take over-the-counter and prescription medicines only as told by your health care provider.  If you smoke, do not smoke without supervision.  Keep all follow-up visits as told by your health care provider. This is important. Contact a health care provider if:  You keep feeling nauseous or you keep vomiting.  You feel light-headed.  You develop a rash.  You have a fever. Get help right away if:  You have trouble breathing. This information is not intended to replace advice given to you by your health care provider. Make sure you discuss any questions you have with your health care provider. Document Released: 04/03/2016 Document Revised: 08/03/2016 Document Reviewed: 04/03/2016 Elsevier Interactive Patient Education  [employer].

## 2018-11-02 ENCOUNTER — Encounter (HOSPITAL_COMMUNITY)
Admission: RE | Admit: 2018-11-02 | Discharge: 2018-11-02 | Disposition: A | Discharge status: Home / Self Care | Payer: Medicare Other | Source: Ambulatory Visit | Attending: Internal Medicine | Admitting: Internal Medicine

## 2018-11-02 ENCOUNTER — Encounter (HOSPITAL_COMMUNITY): Payer: Self-pay

## 2018-11-02 ENCOUNTER — Other Ambulatory Visit: Payer: Self-pay

## 2018-11-02 DIAGNOSIS — Z1211 Encounter for screening for malignant neoplasm of colon: Secondary | ICD-10-CM | POA: Diagnosis not present

## 2018-11-02 DIAGNOSIS — Z01812 Encounter for preprocedural laboratory examination: Secondary | ICD-10-CM | POA: Insufficient documentation

## 2018-11-02 HISTORY — DX: Cardiac arrhythmia, unspecified: I49.9

## 2018-11-02 LAB — CBC WITH DIFFERENTIAL/PLATELET
Abs Immature Granulocytes: 0.03 10*3/uL (ref 0.00–0.07)
Basophils Absolute: 0 10*3/uL (ref 0.0–0.1)
Basophils Relative: 1 %
Eosinophils Absolute: 0.1 10*3/uL (ref 0.0–0.5)
Eosinophils Relative: 2 %
HEMATOCRIT: 46.7 % — AB (ref 36.0–46.0)
HEMOGLOBIN: 14.6 g/dL (ref 12.0–15.0)
IMMATURE GRANULOCYTES: 1 %
LYMPHS ABS: 2.1 10*3/uL (ref 0.7–4.0)
LYMPHS PCT: 35 %
MCH: 28 pg (ref 26.0–34.0)
MCHC: 31.3 g/dL (ref 30.0–36.0)
MCV: 89.5 fL (ref 80.0–100.0)
MONO ABS: 0.5 10*3/uL (ref 0.1–1.0)
MONOS PCT: 9 %
NEUTROS ABS: 3.3 10*3/uL (ref 1.7–7.7)
NEUTROS PCT: 52 %
Platelets: 228 10*3/uL (ref 150–400)
RBC: 5.22 MIL/uL — ABNORMAL HIGH (ref 3.87–5.11)
RDW: 12.8 % (ref 11.5–15.5)
WBC: 6.2 10*3/uL (ref 4.0–10.5)
nRBC: 0 % (ref 0.0–0.2)

## 2018-11-02 LAB — BASIC METABOLIC PANEL
ANION GAP: 6 (ref 5–15)
BUN: 12 mg/dL (ref 8–23)
CHLORIDE: 107 mmol/L (ref 98–111)
CO2: 28 mmol/L (ref 22–32)
Calcium: 9.9 mg/dL (ref 8.9–10.3)
Creatinine, Ser: 0.65 mg/dL (ref 0.44–1.00)
GFR calc Af Amer: >60 mL/min (ref >60)
GFR calc non Af Amer: >60 mL/min (ref >60)
GLUCOSE: 94 mg/dL (ref 70–99)
POTASSIUM: 4.8 mmol/L (ref 3.5–5.1)
Sodium: 141 mmol/L (ref 135–145)

## 2018-11-08 ENCOUNTER — Telehealth: Payer: Self-pay

## 2018-11-08 NOTE — Telephone Encounter (Signed)
   [facility] Pre-operative Risk Assessment    Request for surgical clearance:  1. What type of surgery is being performed? Right upper and lower eyelid repair  2. When is this surgery scheduled? 11/15/18  3. What type of clearance is required (medical clearance vs. Pharmacy clearance to hold med vs. Both)? Both  4. Are there any medications that need to be held prior to surgery and how long? Plavix  5. Practice name and name of physician performing surgery? [facility] Dr.Renzo Zaklivar  6. What is your office phone number [phone]   7.   What is your office fax number [phone]  8.   Anesthesia type  MAC   Vickie [name] 11/08/2018, 4:16 PM  _________________________________________________________________   (provider comments below)

## 2018-11-09 ENCOUNTER — Ambulatory Visit (HOSPITAL_COMMUNITY): Payer: Medicare Other | Admitting: Anesthesiology

## 2018-11-09 ENCOUNTER — Encounter (HOSPITAL_COMMUNITY)
Admission: RE | Disposition: A | Discharge status: Home / Self Care | Payer: Self-pay | Source: Ambulatory Visit | Attending: Internal Medicine

## 2018-11-09 ENCOUNTER — Ambulatory Visit (HOSPITAL_COMMUNITY)
Admission: RE | Admit: 2018-11-09 | Discharge: 2018-11-09 | Disposition: A | Discharge status: Home / Self Care | Payer: Medicare Other | Source: Ambulatory Visit | Attending: Internal Medicine | Admitting: Internal Medicine

## 2018-11-09 DIAGNOSIS — Z79899 Other long term (current) drug therapy: Secondary | ICD-10-CM | POA: Diagnosis not present

## 2018-11-09 DIAGNOSIS — Z880 Allergy status to penicillin: Secondary | ICD-10-CM | POA: Insufficient documentation

## 2018-11-09 DIAGNOSIS — M419 Scoliosis, unspecified: Secondary | ICD-10-CM | POA: Diagnosis not present

## 2018-11-09 DIAGNOSIS — I10 Essential (primary) hypertension: Secondary | ICD-10-CM | POA: Diagnosis not present

## 2018-11-09 DIAGNOSIS — F329 Major depressive disorder, single episode, unspecified: Secondary | ICD-10-CM | POA: Diagnosis not present

## 2018-11-09 DIAGNOSIS — K644 Residual hemorrhoidal skin tags: Secondary | ICD-10-CM | POA: Insufficient documentation

## 2018-11-09 DIAGNOSIS — K219 Gastro-esophageal reflux disease without esophagitis: Secondary | ICD-10-CM | POA: Diagnosis not present

## 2018-11-09 DIAGNOSIS — D123 Benign neoplasm of transverse colon: Secondary | ICD-10-CM | POA: Insufficient documentation

## 2018-11-09 DIAGNOSIS — Z882 Allergy status to sulfonamides status: Secondary | ICD-10-CM | POA: Insufficient documentation

## 2018-11-09 DIAGNOSIS — Z9841 Cataract extraction status, right eye: Secondary | ICD-10-CM | POA: Diagnosis not present

## 2018-11-09 DIAGNOSIS — J449 Chronic obstructive pulmonary disease, unspecified: Secondary | ICD-10-CM | POA: Insufficient documentation

## 2018-11-09 DIAGNOSIS — Z955 Presence of coronary angioplasty implant and graft: Secondary | ICD-10-CM | POA: Diagnosis not present

## 2018-11-09 DIAGNOSIS — K5730 Diverticulosis of large intestine without perforation or abscess without bleeding: Secondary | ICD-10-CM | POA: Diagnosis not present

## 2018-11-09 DIAGNOSIS — K449 Diaphragmatic hernia without obstruction or gangrene: Secondary | ICD-10-CM | POA: Insufficient documentation

## 2018-11-09 DIAGNOSIS — Z803 Family history of malignant neoplasm of breast: Secondary | ICD-10-CM | POA: Insufficient documentation

## 2018-11-09 DIAGNOSIS — Z90710 Acquired absence of both cervix and uterus: Secondary | ICD-10-CM | POA: Diagnosis not present

## 2018-11-09 DIAGNOSIS — Z9842 Cataract extraction status, left eye: Secondary | ICD-10-CM | POA: Diagnosis not present

## 2018-11-09 DIAGNOSIS — E119 Type 2 diabetes mellitus without complications: Secondary | ICD-10-CM | POA: Diagnosis not present

## 2018-11-09 DIAGNOSIS — Z87891 Personal history of nicotine dependence: Secondary | ICD-10-CM | POA: Insufficient documentation

## 2018-11-09 DIAGNOSIS — Z7902 Long term (current) use of antithrombotics/antiplatelets: Secondary | ICD-10-CM | POA: Insufficient documentation

## 2018-11-09 DIAGNOSIS — I252 Old myocardial infarction: Secondary | ICD-10-CM | POA: Diagnosis not present

## 2018-11-09 DIAGNOSIS — Z8249 Family history of ischemic heart disease and other diseases of the circulatory system: Secondary | ICD-10-CM | POA: Diagnosis not present

## 2018-11-09 DIAGNOSIS — E039 Hypothyroidism, unspecified: Secondary | ICD-10-CM | POA: Insufficient documentation

## 2018-11-09 DIAGNOSIS — Z7951 Long term (current) use of inhaled steroids: Secondary | ICD-10-CM | POA: Insufficient documentation

## 2018-11-09 DIAGNOSIS — Z1211 Encounter for screening for malignant neoplasm of colon: Secondary | ICD-10-CM | POA: Diagnosis not present

## 2018-11-09 DIAGNOSIS — E785 Hyperlipidemia, unspecified: Secondary | ICD-10-CM | POA: Insufficient documentation

## 2018-11-09 DIAGNOSIS — Z888 Allergy status to other drugs, medicaments and biological substances status: Secondary | ICD-10-CM | POA: Insufficient documentation

## 2018-11-09 DIAGNOSIS — Z881 Allergy status to other antibiotic agents status: Secondary | ICD-10-CM | POA: Insufficient documentation

## 2018-11-09 DIAGNOSIS — G4733 Obstructive sleep apnea (adult) (pediatric): Secondary | ICD-10-CM | POA: Diagnosis not present

## 2018-11-09 DIAGNOSIS — G8929 Other chronic pain: Secondary | ICD-10-CM | POA: Insufficient documentation

## 2018-11-09 DIAGNOSIS — Z91048 Other nonmedicinal substance allergy status: Secondary | ICD-10-CM | POA: Insufficient documentation

## 2018-11-09 DIAGNOSIS — F419 Anxiety disorder, unspecified: Secondary | ICD-10-CM | POA: Diagnosis not present

## 2018-11-09 HISTORY — PX: COLONOSCOPY WITH PROPOFOL: SHX5780

## 2018-11-09 HISTORY — PX: POLYPECTOMY: SHX5525

## 2018-11-09 LAB — GLUCOSE, CAPILLARY: Glucose-Capillary: 92 mg/dL (ref 70–99)

## 2018-11-09 SURGERY — COLONOSCOPY WITH PROPOFOL
Anesthesia: Monitor Anesthesia Care

## 2018-11-09 MED ORDER — PROMETHAZINE HCL 25 MG/ML IJ SOLN: 6.2500 mg | INTRAMUSCULAR | Status: DC | PRN

## 2018-11-09 MED ORDER — CHLORHEXIDINE GLUCONATE CLOTH 2 % EX PADS: 6.0000 | MEDICATED_PAD | Freq: Once | CUTANEOUS | Status: DC

## 2018-11-09 MED ORDER — HYDROMORPHONE HCL 1 MG/ML IJ SOLN: 0.2500 mg | INTRAMUSCULAR | Status: DC | PRN

## 2018-11-09 MED ORDER — MEPERIDINE HCL 100 MG/ML IJ SOLN: 6.2500 mg | INTRAMUSCULAR | Status: DC | PRN

## 2018-11-09 MED ORDER — ATROPINE SULFATE 0.4 MG/ML IJ SOLN
INTRAMUSCULAR | Status: AC
  Filled 2018-11-09: qty 1

## 2018-11-09 MED ORDER — ATROPINE SULFATE 0.4 MG/ML IJ SOLN
INTRAMUSCULAR | Status: DC | PRN
  Administered 2018-11-09: 0.2 mg via INTRAVENOUS

## 2018-11-09 MED ORDER — EPHEDRINE SULFATE 50 MG/ML IJ SOLN: INTRAMUSCULAR | Status: DC | PRN

## 2018-11-09 MED ORDER — PROPOFOL 10 MG/ML IV BOLUS
INTRAVENOUS | Status: AC
  Filled 2018-11-09: qty 60

## 2018-11-09 MED ORDER — LACTATED RINGERS IV SOLN: INTRAVENOUS | Status: DC

## 2018-11-09 MED ORDER — STERILE WATER FOR IRRIGATION IR SOLN
Status: DC | PRN
  Administered 2018-11-09: 1.5 mL

## 2018-11-09 MED ORDER — PROPOFOL 500 MG/50ML IV EMUL
INTRAVENOUS | Status: DC | PRN
  Administered 2018-11-09: 125 ug/kg/min via INTRAVENOUS
  Administered 2018-11-09: 100 ug/kg/min via INTRAVENOUS

## 2018-11-09 MED ORDER — HYDROCODONE-ACETAMINOPHEN 7.5-325 MG PO TABS: 1.0000 | ORAL_TABLET | Freq: Once | ORAL | Status: DC | PRN

## 2018-11-09 MED ORDER — CLOPIDOGREL BISULFATE 75 MG PO TABS: 75.0000 mg | ORAL_TABLET | Freq: Every day | ORAL | 3 refills | Status: DC

## 2018-11-09 MED ORDER — LACTATED RINGERS IV SOLN
INTRAVENOUS | Status: DC
  Administered 2018-11-09: 07:00:00 via INTRAVENOUS

## 2018-11-09 MED ORDER — PROPOFOL 10 MG/ML IV BOLUS
INTRAVENOUS | Status: DC | PRN
  Administered 2018-11-09: 40 mg via INTRAVENOUS
  Administered 2018-11-09: 20 mg via INTRAVENOUS
  Administered 2018-11-09: 60 mg via INTRAVENOUS
  Administered 2018-11-09: 20 mg via INTRAVENOUS

## 2018-11-09 NOTE — Discharge Instructions (Signed)
Resume clopidogrel for eye surgery as recommended by ophthalmologist. Resume other medications as before. High-fiber diet. No driving for 24 hours. Physician will call with biopsy results.     Monitored Anesthesia Care, Care After These instructions provide you with information about caring for yourself after your procedure. Your health care provider may also give you more specific instructions. Your treatment has been planned according to current medical practices, but problems sometimes occur. Call your health care provider if you have any problems or questions after your procedure. What can I expect after the procedure? After your procedure, it is common to:  Feel sleepy for several hours.  Feel clumsy and have poor balance for several hours.  Feel forgetful about what happened after the procedure.  Have poor judgment for several hours.  Feel nauseous or vomit.  Have a sore throat if you had a breathing tube during the procedure.  Follow these instructions at home: For at least 24 hours after the procedure:   Do not: ? Participate in activities in which you could fall or become injured. ? Drive. ? Use heavy machinery. ? Drink alcohol. ? Take sleeping pills or medicines that cause drowsiness. ? Make important decisions or sign legal documents. ? Take care of children on your own.  Rest. Eating and drinking  Follow the diet that is recommended by your health care provider.  If you vomit, drink water, juice, or soup when you can drink without vomiting.  Make sure you have little or no nausea before eating solid foods. General instructions  Have a responsible adult stay with you until you are awake and alert.  Take over-the-counter and prescription medicines only as told by your health care provider.  If you smoke, do not smoke without supervision.  Keep all follow-up visits as told by your health care provider. This is important. Contact a health care provider  if:  You keep feeling nauseous or you keep vomiting.  You feel light-headed.  You develop a rash.  You have a fever. Get help right away if:  You have trouble breathing. This information is not intended to replace advice given to you by your health care provider. Make sure you discuss any questions you have with your health care provider. Document Released: 04/03/2016 Document Revised: 08/03/2016 Document Reviewed: 04/03/2016 Elsevier Interactive Patient Education  2018 [employer].     Colonoscopy, Adult, Care After This sheet gives you information about how to care for yourself after your procedure. Your doctor may also give you more specific instructions. If you have problems or questions, call your doctor. Follow these instructions at home: General instructions   For the first 24 hours after the procedure: ? Do not drive or use machinery. ? Do not sign important documents. ? Do not drink alcohol. ? Do your daily activities more slowly than normal. ? Eat foods that are soft and easy to digest. ? Rest often.  Take over-the-counter or prescription medicines only as told by your doctor.  It is up to you to get the results of your procedure. Ask your doctor, or the department performing the procedure, when your results will be ready. To help cramping and bloating:  Try walking around.  Put heat on your belly (abdomen) as told by your doctor. Use a heat source that your doctor recommends, such as a moist heat pack or a heating pad. ? Put a towel between your skin and the heat source. ? Leave the heat on for 20-30 minutes. ? Remove  the heat if your skin turns bright red. This is especially important if you cannot feel pain, heat, or cold. You can get burned. Eating and drinking  Drink enough fluid to keep your pee (urine) clear or pale yellow.  Return to your normal diet as told by your doctor. Avoid heavy or fried foods that are hard to digest.  Avoid drinking  alcohol for as long as told by your doctor. Contact a doctor if:  You have blood in your poop (stool) 2-3 days after the procedure. Get help right away if:  You have more than a small amount of blood in your poop.  You see large clumps of tissue (blood clots) in your poop.  Your belly is swollen.  You feel sick to your stomach (nauseous).  You throw up (vomit).  You have a fever.  You have belly pain that gets worse, and medicine does not help your pain. This information is not intended to replace advice given to you by your health care provider. Make sure you discuss any questions you have with your health care provider. Document Released: 01/14/2011 Document Revised: 09/05/2016 Document Reviewed: 09/05/2016 Elsevier Interactive Patient Education  2017 [employer].    High-Fiber Diet Fiber, also called dietary fiber, is a type of carbohydrate found in fruits, vegetables, whole grains, and beans. A high-fiber diet can have many health benefits. Your health care provider may recommend a high-fiber diet to help:  Prevent constipation. Fiber can make your bowel movements more regular.  Lower your cholesterol.  Relieve hemorrhoids, uncomplicated diverticulosis, or irritable bowel syndrome.  Prevent overeating as part of a weight-loss plan.  Prevent heart disease, type 2 diabetes, and certain cancers.  What is my plan? The recommended daily intake of fiber includes:  38 grams for men under age 55.  60 grams for men over age 50.  71 grams for women under age 24.  67 grams for women over age 2.  You can get the recommended daily intake of dietary fiber by eating a variety of fruits, vegetables, grains, and beans. Your health care provider may also recommend a fiber supplement if it is not possible to get enough fiber through your diet. What do I need to know about a high-fiber diet?  Fiber supplements have not been widely studied for their effectiveness, so it is  better to get fiber through food sources.  Always check the fiber content on thenutrition facts label of any prepackaged food. Look for foods that contain at least 5 grams of fiber per serving.  Ask your dietitian if you have questions about specific foods that are related to your condition, especially if those foods are not listed in the following section.  Increase your daily fiber consumption gradually. Increasing your intake of dietary fiber too quickly may cause bloating, cramping, or gas.  Drink plenty of water. Water helps you to digest fiber. What foods can I eat? Grains Whole-grain breads. Multigrain cereal. Oats and oatmeal. Brown rice. Barley. Bulgur wheat. [place]. Bran muffins. Popcorn. Rye wafer crackers. Vegetables Sweet potatoes. Spinach. Kale. Artichokes. Cabbage. Broccoli. Green peas. Carrots. Squash. Fruits Berries. Pears. Apples. Oranges. Avocados. Prunes and raisins. Dried figs. Meats and Other Protein Sources [place], kidney, pinto, and soy beans. Split peas. Lentils. Nuts and seeds. Dairy Fiber-fortified yogurt. Beverages Fiber-fortified soy milk. Fiber-fortified orange juice. Other Fiber bars. The items listed above may not be a complete list of recommended foods or beverages. Contact your dietitian for more options. What foods are not recommended?  Grains White bread. Pasta made with refined flour. White rice. Vegetables Fried potatoes. Canned vegetables. Well-cooked vegetables. Fruits Fruit juice. Cooked, strained fruit. Meats and Other Protein Sources Fatty cuts of meat. Fried Sales executive or fried fish. Dairy Milk. Yogurt. Cream cheese. Sour cream. Beverages Soft drinks. Other Cakes and pastries. Butter and oils. The items listed above may not be a complete list of foods and beverages to avoid. Contact your dietitian for more information. What are some tips for including high-fiber foods in my diet?  Eat a wide variety of high-fiber foods.  Make sure  that half of all grains consumed each day are whole grains.  Replace breads and cereals made from refined flour or white flour with whole-grain breads and cereals.  Replace white rice with brown rice, bulgur wheat, or millet.  Start the day with a breakfast that is high in fiber, such as a cereal that contains at least 5 grams of fiber per serving.  Use beans in place of meat in soups, salads, or pasta.  Eat high-fiber snacks, such as berries, raw vegetables, nuts, or popcorn. This information is not intended to replace advice given to you by your health care provider. Make sure you discuss any questions you have with your health care provider. Document Released: 12/12/2005 Document Revised: 05/19/2016 Document Reviewed: 05/27/2014 Elsevier Interactive Patient Education  [employer].

## 2018-11-09 NOTE — Op Note (Signed)
[facility] Patient Name: Vickie [name] Procedure Date: 11/09/2018 7:26 AM MRN: [id number] Date of Birth: [birth year] Attending MD: Hildred Laser , MD CSN: [id number] Age: 72 Admit Type: Outpatient Procedure:                Colonoscopy Indications:              Screening for colorectal malignant neoplasm Providers:                Hildred Laser, MD, Lurline Del, RN, Gerome Sam,                            RN, Nelma Rothman, Technician Referring MD:             Halford Chessman, MD Medicines:                Propofol per Anesthesia Complications:            No immediate complications. Estimated Blood Loss:     Estimated blood loss was minimal. Procedure:                Pre-Anesthesia Assessment:                           - Prior to the procedure, a History and Physical                            was performed, and patient medications and                            allergies were reviewed. The patient's tolerance of                            previous anesthesia was also reviewed. The risks                            and benefits of the procedure and the sedation                            options and risks were discussed with the patient.                            All questions were answered, and informed consent                            was obtained. Prior Anticoagulants: The patient                            last took Plavix (clopidogrel) 7 days prior to the                            procedure. ASA Grade Assessment: III - A patient                            with severe systemic disease. After reviewing the  risks and benefits, the patient was deemed in                            satisfactory condition to undergo the procedure.                           After obtaining informed consent, the colonoscope                            was passed under direct vision. Throughout the                            procedure, the patient's blood pressure, pulse,  and                            oxygen saturations were monitored continuously. The                            [id number]-[id number] ([id number]) scope was introduced through                            the and advanced to the the cecum, identified by                            appendiceal orifice and ileocecal valve. The                            colonoscopy was technically difficult and complex                            due to restricted mobility of the colon. The                            patient tolerated the procedure well. The quality                            of the bowel preparation was excellent. The                            ileocecal valve, appendiceal orifice, and rectum                            were photographed. Scope In: 7:37:59 AM Scope Out: 8:06:40 AM Scope Withdrawal Time: 0 hours 14 minutes 32 seconds  Total Procedure Duration: 0 hours 28 minutes 41 seconds  Findings:      The perianal and digital rectal examinations were normal.      Two polyps were found in the hepatic flexure. The polyps were 4 to 6 mm       in size. These polyps were removed with a cold snare. Resection and       retrieval were complete. For hemostasis, one hemostatic clip was       successfully placed (MR conditional). There was no bleeding at the end       of the procedure. The pathology specimen was  placed into Bottle Number 1.      A small polyp was found in the hepatic flexure. The polyp was sessile.       Biopsies were taken with a cold forceps for histology. The pathology       specimen was placed into Bottle Number 1.      Scattered small and large-mouthed diverticula were found in the sigmoid       colon.      External hemorrhoids were found during retroflexion. The hemorrhoids       were small. Impression:               - Two 4 to 6 mm polyps at the hepatic flexure,                            removed with a cold snare. Resected and retrieved.                            Clip (MR conditional) was  placed.                           - One small polyp at the hepatic flexure. Biopsied.                           - Diverticulosis in the sigmoid colon.                           - External hemorrhoids. Moderate Sedation:      Per Anesthesia Care Recommendation:           - Patient has a contact number available for                            emergencies. The signs and symptoms of potential                            delayed complications were discussed with the                            patient. Return to normal activities tomorrow.                            Written discharge instructions were provided to the                            patient.                           - High fiber diet today.                           - Continue present medications.                           - Resume Plavix (clopidogrel) at prior dose                            tomorrow.                           -  Await pathology results.                           - Repeat colonoscopy is recommended for                            surveillance. The colonoscopy date will be                            determined after pathology results from today's                            exam become available for review. Procedure Code(s):        --- Professional ---                           [phone], Colonoscopy, flexible; with removal of                            tumor(s), polyp(s), or other lesion(s) by snare                            technique                           45380, 59, Colonoscopy, flexible; with biopsy,                            single or multiple Diagnosis Code(s):        --- Professional ---                           D12.3, Benign neoplasm of transverse colon (hepatic                            flexure or splenic flexure)                           Z12.11, Encounter for screening for malignant                            neoplasm of colon                           K64.4, Residual hemorrhoidal skin tags                            K57.30, Diverticulosis of large intestine without                            perforation or abscess without bleeding CPT copyright 2018 American Medical Association. All rights reserved. The codes documented in this report are preliminary and upon coder review may  be revised to meet current compliance requirements. Hildred Laser, MD Hildred Laser, MD 11/09/2018 8:18:18 AM This report has been signed electronically. Number of Addenda: 0

## 2018-11-09 NOTE — Anesthesia Preprocedure Evaluation (Signed)
Anesthesia Evaluation    Airway Mallampati: II       Dental  (+) Teeth Intact   Pulmonary shortness of breath, sleep apnea , pneumonia, COPD, former smoker,    breath sounds clear to auscultation       Cardiovascular hypertension, On Medications + angina + CAD, + Past MI and + DOE  + dysrhythmias  Rhythm:regular     Neuro/Psych PSYCHIATRIC DISORDERS Anxiety Depression    GI/Hepatic hiatal hernia, GERD  ,  Endo/Other  diabetes, Type 2Hypothyroidism   Renal/GU      Musculoskeletal   Abdominal   Peds  Hematology   Anesthesia Other Findings States significant improvement in cardiac health since stented late 2018  Reproductive/Obstetrics                             Anesthesia Physical Anesthesia Plan  ASA: IV  Anesthesia Plan: MAC   Post-op Pain Management:    Induction:   PONV Risk Score and Plan:   Airway Management Planned:   Additional Equipment:   Intra-op Plan:   Post-operative Plan:   Informed Consent:   Plan Discussed with: Anesthesiologist  Anesthesia Plan Comments:         Anesthesia Quick Evaluation

## 2018-11-09 NOTE — H&P (Signed)
Vickie [name] is an 72 y.o. female.   Chief Complaint: Patient is here for colonoscopy. HPI: Patient is 72 year old Caucasian female with multiple medical problems for screening colonoscopy.  She denies abdominal pain change in bowel habits or rectal bleeding.  Last colonoscopy was unremarkable in June 2008. Patient is on clopidogrel but it is on hold.  She says she will not go back on it until she has eye surgery which is planned for next week. Family history is negative for CRC.  Past Medical History:  Diagnosis Date  . Anginal pain ([place])   . Anxiety   . Anxiety disorder    With apparent panic attacks  . Arthritis    "hands, knees, back" (09/18/2017)  . CAD S/P percutaneous coronary angioplasty 08/2017   Single-vessel CAD involving second diagonal branch treated with DES stent Synergy 2.25 mm x 12 mm (2.4 mm)  . Chronic back pain    "all over" (09/18/2017)  . COPD (chronic obstructive pulmonary disease) ([place])    PFTs were done in 2008 at [facility]  . Depression   . Dyspnea   . Dysrhythmia    LBBB  . Essential hypertension   . GERD (gastroesophageal reflux disease)   . Hiatal hernia   . History of left bundle branch block (LBBB) 08/2017   Rate Related LBBB noted in cath lab  . Hyperlipidemia   . Macular degeneration    right eye  . Myocardial infarction [facility]) 2007   "medically induced"  . Nonocclusive coronary atherosclerosis of native coronary artery 2006 through 2012   multi caths 2012, 2006, [id number] (3976 complicated by catheter-induced dissection of small nondominant RCA, patent in 2009 with no residual abnormality); Myoview August 2013: LOW RISK, normal EF. ; Echocardiogram [name] - January 2014) moderate LVH, EF 55-65%. No significant valvular disease.  . OSA (obstructive sleep apnea) 9/25/ 2012   tested 2009; tetested sleep study 07/2011--titration 09/20/2011 now use Bi-PAP  . OSA on CPAP   . Pneumonia    "several times in 2017; 3 times already this year"  (09/18/2017)  . Scoliosis   . Type II diabetes mellitus ([place])     Past Surgical History:  Procedure Laterality Date  . CARDIAC CATHETERIZATION  [id number]; 2009   Non-occlusice CAD - only 80% ostial SP1;  NON DOMINANNT  RCA (catheter insuced dissection with MI in 2007 --> resolved by 2009 cath)  . CATARACT EXTRACTION, BILATERAL Bilateral 2017   Toric lens "in the left eye only"  . CORONARY STENT INTERVENTION N/A 09/18/2017   Procedure: CORONARY STENT INTERVENTION;  Surgeon: Leonie Man, MD;  Location: [facility] INVASIVE CV LAB: DES PCI Diag2: Synergy DES 2.25 mm x 12 mm (2.4 mm)  . DIAGNOSTIC LAPAROSCOPY Right   . DILATION AND CURETTAGE OF UTERUS    . EYE SURGERY    . INTRAVASCULAR PRESSURE WIRE/FFR STUDY N/A 09/18/2017   Procedure: INTRAVASCULAR PRESSURE WIRE/FFR STUDY;  Surgeon: Leonie Man, MD;  Location: [facility] CV LAB;  Service: Cardiovascular: FFR Diag 2 -- 0.78 (significcant) --> PCI  . KNEE ARTHROSCOPY Right   . LEFT HEART CATH AND CORONARY ANGIOGRAPHY N/A 09/18/2017   Procedure: LEFT HEART CATH AND CORONARY ANGIOGRAPHY;  Surgeon: Leonie Man, MD;  Location: [facility] INVASIVE CV LAB: Culpril - 80% Diag2 (FFR 0.78)- PCI.  pLAD 40%, pCx 40%. Post PCI LBBB. LVEDP - ~20 mmHg. EF 55-60%  . LIPOMA EXCISION Right ~ 2015   anterior abdomen  . PARS PLANA VITRECTOMY W/ REPAIR  OF MACULAR HOLE Right    Unsuccessful repair.  Hole filled  . VAGINAL HYSTERECTOMY      Family History  Problem Relation Age of Onset  . CAD Mother   . Breast cancer Maternal Aunt   . Breast cancer Maternal Aunt    Social History:  reports that she quit smoking about 42 years ago. Her smoking use included cigarettes. She has a 25.50 pack-year smoking history. She has never used smokeless tobacco. She reports that she does not drink alcohol or use drugs.  Allergies:  Allergies  Allergen Reactions  . Adhesive [Tape] Other (See Comments)    REACTION: redness/irritation at application site. **Certain  bandages/adhesives cause this reaction**  . Avelox [Moxifloxacin] Anaphylaxis  . Bactrim [Sulfamethoxazole-Trimethoprim] Anaphylaxis, Shortness Of Breath, Rash and Other (See Comments)    REACTION: Choking, inability to swallow, redness  . Levaquin [Levofloxacin] Anaphylaxis, Shortness Of Breath, Rash and Other (See Comments)    Reaction:Choking Brand name Levaquin ok per pt  . Mucinex [Guaifenesin Er] Anaphylaxis, Shortness Of Breath and Rash  . Penicillins Other (See Comments)    "Passed out" Has patient had a PCN reaction causing immediate rash, facial/tongue/throat swelling, SOB or lightheadedness with hypotension: Unknown Has patient had a PCN reaction causing severe rash involving mucus membranes or skin necrosis: No Has patient had a PCN reaction that required hospitalization: No Has patient had a PCN reaction occurring within the last 10 years: No If all of the above answers are "NO", then may proceed with Cephalosporin use.   [place] [Pseudoephedrine Hcl] Shortness Of Breath, Rash and Other (See Comments)    REACTION: Choking, redness, inability to swallow  . Crestor [Rosuvastatin] Other (See Comments)    Leg pain  . Lipitor [Atorvastatin] Other (See Comments)    Leg pain  . Vytorin [Ezetimibe-Simvastatin] Other (See Comments)    Leg pain  . Norvasc [Amlodipine] Other (See Comments)    unknown    Medications Prior to Admission  Medication Sig Dispense Refill  . albuterol (PROAIR HFA) 108 (90 BASE) MCG/ACT inhaler Inhale 1 puff into the lungs every 6 (six) hours as needed for wheezing or shortness of breath.    Marland Kitchen albuterol (PROVENTIL) (2.5 MG/3ML) 0.083% nebulizer solution Take 2.5 mg by nebulization every 6 (six) hours as needed for wheezing or shortness of breath.     . ALPRAZolam (XANAX) 0.5 MG tablet Take 0.25 mg by mouth 2 (two) times daily.     . Biotin w/ Vitamins C & E (HAIR/SKIN/NAILS PO) Take 1 tablet by mouth at bedtime.    . busPIRone (BUSPAR) 15 MG tablet Take  15 mg by mouth 2 (two) times daily.    . Calcium Carb-Cholecalciferol (CALCIUM 600 + D PO) Take 1 tablet by mouth daily.     . clopidogrel (PLAVIX) 75 MG tablet TAKE 1 TABLET DAILY WITH BREAKFAST. (Patient taking differently: Take 75 mg by mouth daily. ) 90 tablet 3  . Coenzyme Q10 (COQ10) 100 MG CAPS Take 100 mg by mouth every morning.    Marland Kitchen CRANBERRY PO Take 1 capsule by mouth daily.     Marland Kitchen escitalopram (LEXAPRO) 20 MG tablet Take 20 mg by mouth every morning.     . furosemide (LASIX) 20 MG tablet Take 1 tablet (20 mg total) by mouth every other day. 30 tablet 1  . isosorbide mononitrate (IMDUR) 60 MG 24 hr tablet TAKE ONE TABLET BY MOUTH ONCE DAILY. (Patient taking differently: Take 60 mg by mouth daily. ) 90 tablet  3  . labetalol (NORMODYNE) 100 MG tablet Take 1 tablet (100 mg total) by mouth 2 (two) times daily. 60 tablet 11  . mirtazapine (REMERON) 15 MG tablet Take 7.5 mg by mouth at bedtime.     . Multiple Vitamin (MULTIVITAMIN) tablet Take 1 tablet by mouth at bedtime.     . Omega-3 Fatty Acids (FISH OIL PO) Take 1 capsule by mouth at bedtime.    Marland Kitchen OVER THE COUNTER MEDICATION Take 1 tablet by mouth at bedtime. Restful Legs Supplement    . pantoprazole (PROTONIX) 40 MG tablet Take 1 tablet (40 mg total) by mouth daily. Please call office to make appointment 90 tablet 3  . potassium chloride SA (K-DUR,KLOR-CON) 20 MEQ tablet Take 1 tablet (20 mEq total) by mouth every other day. Take on days that you take lasix. 30 tablet 1  . pravastatin (PRAVACHOL) 40 MG tablet TAKE ONE TABLET BY MOUTH DAILY. (Patient taking differently: Take 40 mg by mouth daily. ) 30 tablet 3  . Tiotropium Bromide Monohydrate (SPIRIVA RESPIMAT) 2.5 MCG/ACT AERS Inhale 1 puff into the lungs 2 (two) times daily.     . AMBULATORY NON FORMULARY MEDICATION Inject 300 mg into the skin as directed. Medication Name:Inclisiran sodium 300 mg vs placebo    . nitroGLYCERIN (NITROSTAT) 0.4 MG SL tablet PLACE 1 TAB UNDER TONGUE EVERY 5  MIN IF NEEDED FOR CHEST PAIN. MAY USE 3 TIMES.NO RELIEF CALL 911. (Patient taking differently: Place 0.4 mg under the tongue every 5 (five) minutes as needed for chest pain. ) 25 tablet 5    No results found for this or any previous visit (from the past 48 hour(s)). No results found.  ROS  There were no vitals taken for this visit. Physical Exam  Constitutional: She appears well-developed and well-nourished.  HENT:  Mouth/Throat: Oropharynx is clear and moist.  Eyes: Conjunctivae are normal. No scleral icterus.  Neck: No thyromegaly present.  Cardiovascular: Normal rate, regular rhythm and normal heart sounds.  No murmur heard. Respiratory: Effort normal and breath sounds normal.  GI:  Abdomen is full.  It is soft and nontender with organomegaly or masses.  Musculoskeletal: She exhibits no edema.  She has rudimentary third and fourth finger in her right hand and rudimentary thumb and one finger and left hand.  Congenital abnormality.  Lymphadenopathy:    She has no cervical adenopathy.  Neurological: She is alert.  Skin: Skin is warm and dry.     Assessment/Plan Average risk screening colonoscopy.  Hildred Laser, MD 11/09/2018, 7:20 AM

## 2018-11-09 NOTE — Anesthesia Postprocedure Evaluation (Signed)
Anesthesia Post Note  Patient: EMAYA PRESTON  Procedure(s) Performed: COLONOSCOPY WITH PROPOFOL (N/A ) POLYPECTOMY  Patient location during evaluation: PACU Anesthesia Type: MAC Level of consciousness: awake and alert and patient cooperative Pain management: satisfactory to patient Vital Signs Assessment: post-procedure vital signs reviewed and stable Respiratory status: spontaneous breathing Cardiovascular status: stable Postop Assessment: no apparent nausea or vomiting Anesthetic complications: no     Last Vitals:  Vitals:   11/09/18 0810 11/09/18 0817  BP: (!) 85/46 (!) 95/55  Pulse: 62 61  Resp: 16 (!) 22  Temp: 36.5 C   SpO2: 95% 95%    Last Pain:  Vitals:   11/09/18 0810  TempSrc:   PainSc: 0-No pain                 Kahle Mcqueen

## 2018-11-09 NOTE — Progress Notes (Addendum)
Relayed to [employer]. Patient may go back on Plavix in 24 hours from Dr. Olevia Perches GI standpoint, but folllow eye surgeons recommendations for holding Plavix for eye surgery.

## 2018-11-09 NOTE — Progress Notes (Signed)
Out of sequence.  Call placed to Daughter Odetta Pink at [phone] at 29am. Advised daughter of no MRI unless call Dr. Olevia Perches office to have order for xray to check if clips are still present. Daughter verbalized understanding.  Pink clip informational slip mailed to patient and daughter verbalized understanding.

## 2018-11-09 NOTE — Anesthesia Procedure Notes (Signed)
Procedure Name: MAC Date/Time: 11/09/2018 7:30 AM Performed by: Vista Deck, CRNA Pre-anesthesia Checklist: Patient identified, Emergency Drugs available, Suction available, Timeout performed and Patient being monitored Patient Re-evaluated:Patient Re-evaluated prior to induction Oxygen Delivery Method: Nasal Cannula

## 2018-11-09 NOTE — Transfer of Care (Signed)
Immediate Anesthesia Transfer of Care Note  Patient: Vickie [name]  Procedure(s) Performed: COLONOSCOPY WITH PROPOFOL (N/A ) POLYPECTOMY  Patient Location: PACU  Anesthesia Type:MAC  Level of Consciousness: awake and patient cooperative  Airway & Oxygen Therapy: Patient Spontanous Breathing and Patient connected to nasal cannula oxygen  Post-op Assessment: Report given to RN and Post -op Vital signs reviewed and stable  Post vital signs: Reviewed and stable  Last Vitals:  Vitals Value Taken Time  BP    Temp    Pulse    Resp    SpO2      Last Pain:  Vitals:   11/09/18 0730  TempSrc:   PainSc: 0-No pain         Complications: No apparent anesthesia complications

## 2018-11-12 ENCOUNTER — Other Ambulatory Visit: Payer: Self-pay | Admitting: Cardiology

## 2018-11-12 NOTE — Telephone Encounter (Signed)
   Primary Cardiologist: Glenetta Hew, MD  Chart reviewed as part of pre-operative protocol coverage. Patient was contacted 11/12/2018 in reference to pre-operative risk assessment for pending surgery as outlined below.  Vickie [name] was last seen on 07/26/2018 by Dr. Ellyn Hack.  Since that day, LYLEE CORROW has done well without any chest pain.  Therefore, based on ACC/AHA guidelines, the patient would be at acceptable risk for the planned procedure without further cardiovascular testing.   I will route this recommendation to the requesting party via Epic fax function and remove from pre-op pool.  Please call with questions. She has been instructed to hold plavix for 5 days prior to the procedure and restart plavix as soon as possible after the procedure at the discretion of surgeon.   Almyra Deforest, [place] 11/12/2018, 3:57 PM

## 2018-11-13 ENCOUNTER — Encounter: Payer: Medicare Other | Admitting: *Deleted

## 2018-11-13 VITALS — BP 144/70 | HR 59 | Temp 98.2°F | Resp 21 | Wt 177.2 lb

## 2018-11-13 DIAGNOSIS — Z006 Encounter for examination for normal comparison and control in clinical research program: Secondary | ICD-10-CM

## 2018-11-13 NOTE — Research (Signed)
Subject to research clinic for visit 2 Day 90 in the [facility] 8 research trial.  No cos, aes or saes to report.  Injection given and subject remained in clinic for 30 minutes post for observation.  Next visit scheduled and subject experienced no events and left for home.

## 2018-11-14 ENCOUNTER — Encounter (HOSPITAL_COMMUNITY): Payer: Self-pay | Admitting: Internal Medicine

## 2018-11-15 DIAGNOSIS — H02011 Cicatricial entropion of right upper eyelid: Secondary | ICD-10-CM | POA: Diagnosis not present

## 2018-11-15 DIAGNOSIS — H02052 Trichiasis without entropian right lower eyelid: Secondary | ICD-10-CM | POA: Diagnosis not present

## 2018-11-15 DIAGNOSIS — H02032 Senile entropion of right lower eyelid: Secondary | ICD-10-CM | POA: Diagnosis not present

## 2018-11-15 DIAGNOSIS — Z09 Encounter for follow-up examination after completed treatment for conditions other than malignant neoplasm: Secondary | ICD-10-CM | POA: Diagnosis not present

## 2018-11-15 DIAGNOSIS — H02051 Trichiasis without entropian right upper eyelid: Secondary | ICD-10-CM | POA: Diagnosis not present

## 2018-11-16 ENCOUNTER — Telehealth: Payer: Self-pay | Admitting: Cardiology

## 2018-11-16 NOTE — Telephone Encounter (Signed)
Pt c/o medication issue:  1. Name of Medication: clopidogrel (PLAVIX)  2. How are you currently taking this medication (dosage and times per day)? 75 mg tablet   3. Are you having a reaction (difficulty breathing--STAT)? no  4. What is your medication issue?  Her daughter called stating her mother had eye surgery yesterday, and a colonoscopy last Friday.  Her mother hasn't taken Plavix in two weeks. She wants to know when her mother can restart taking the Plavix.

## 2018-11-16 NOTE — Telephone Encounter (Signed)
Lm2cb 

## 2018-11-19 NOTE — Telephone Encounter (Signed)
First off she only need to hold it 5 to 7 days preop.  Can start back whenever it is safe according to the surgeon.  Usually that is 1 to 2 days after surgery.  Should be fine by now.   Glenetta Hew, MD

## 2018-11-19 NOTE — Telephone Encounter (Signed)
Left detail message on secure voicemail ,per dpi.  Per Dr Ellyn Hack , MAY RESTART PLAVIX , ANY QUESTION MAY CALL BACK.

## 2018-11-19 NOTE — Telephone Encounter (Signed)
Attempted to contact patient, Vickie [name]. Left call back number.

## 2018-11-19 NOTE — Telephone Encounter (Signed)
Spoke to daughter - she states patient had eyelid surgery on 11/15/18 - Thursday.  Per daughter , she states the  Eye doctor  Told patient to  Restart plavix in 7 days or she can call  Heart doctor for instructions. Daughter states  Patient has bee off plavix for at least 2 weeks in total  Due to having a colonoscopy on 11/08/18.   daughter aware will defer to Dr Ellyn Hack and contact her back

## 2018-11-21 DIAGNOSIS — I10 Essential (primary) hypertension: Secondary | ICD-10-CM | POA: Diagnosis not present

## 2018-11-21 DIAGNOSIS — J449 Chronic obstructive pulmonary disease, unspecified: Secondary | ICD-10-CM | POA: Diagnosis not present

## 2018-11-21 DIAGNOSIS — E119 Type 2 diabetes mellitus without complications: Secondary | ICD-10-CM | POA: Diagnosis not present

## 2018-11-21 DIAGNOSIS — J301 Allergic rhinitis due to pollen: Secondary | ICD-10-CM | POA: Diagnosis not present

## 2018-12-10 DIAGNOSIS — E1151 Type 2 diabetes mellitus with diabetic peripheral angiopathy without gangrene: Secondary | ICD-10-CM | POA: Diagnosis not present

## 2018-12-10 DIAGNOSIS — E1140 Type 2 diabetes mellitus with diabetic neuropathy, unspecified: Secondary | ICD-10-CM | POA: Diagnosis not present

## 2018-12-11 ENCOUNTER — Other Ambulatory Visit: Payer: Self-pay | Admitting: Cardiology

## 2019-01-03 ENCOUNTER — Ambulatory Visit: Payer: Medicare Other | Admitting: Cardiology

## 2019-01-03 ENCOUNTER — Encounter: Payer: Self-pay | Admitting: Cardiology

## 2019-01-03 VITALS — BP 136/80 | HR 58 | Ht 59.0 in | Wt 185.6 lb

## 2019-01-03 DIAGNOSIS — I10 Essential (primary) hypertension: Secondary | ICD-10-CM | POA: Diagnosis not present

## 2019-01-03 DIAGNOSIS — E669 Obesity, unspecified: Secondary | ICD-10-CM

## 2019-01-03 DIAGNOSIS — I2510 Atherosclerotic heart disease of native coronary artery without angina pectoris: Secondary | ICD-10-CM

## 2019-01-03 DIAGNOSIS — I25119 Atherosclerotic heart disease of native coronary artery with unspecified angina pectoris: Secondary | ICD-10-CM | POA: Diagnosis not present

## 2019-01-03 DIAGNOSIS — E785 Hyperlipidemia, unspecified: Secondary | ICD-10-CM

## 2019-01-03 DIAGNOSIS — Z9861 Coronary angioplasty status: Secondary | ICD-10-CM

## 2019-01-03 DIAGNOSIS — Z01818 Encounter for other preprocedural examination: Secondary | ICD-10-CM

## 2019-01-03 MED ORDER — NITROGLYCERIN 0.4 MG SL SUBL: SUBLINGUAL_TABLET | SUBLINGUAL | 5 refills | Status: DC

## 2019-01-03 MED ORDER — ASPIRIN EC 81 MG PO TBEC: 81.0000 mg | DELAYED_RELEASE_TABLET | Freq: Every day | ORAL | 9 refills | Status: DC

## 2019-01-03 NOTE — Patient Instructions (Signed)
Medication Instructions:   STOP TAKING  PLAVIX -CLOPIDOGREL   START TAKING ASPIRIN 81 MG , BUT IF YOU USE ALEVE ON ANY DAY DO NOT TAKE ASPRIN 81 MG THAT DAY  If you need a refill on your cardiac medications before your next appointment, please call your pharmacy.   Lab work: NOT NEEDED If you have labs (blood work) drawn today and your tests are completely normal, you will receive your results only by: Marland Kitchen MyChart Message (if you have MyChart) OR . A paper copy in the mail If you have any lab test that is abnormal or we need to change your treatment, we will call you to review the results.  Testing/Procedures: NOT NEEDED  Follow-Up: At [facility], you and your health needs are our priority.  As part of our continuing mission to provide you with exceptional heart care, we have created designated Provider Care Teams.  These Care Teams include your primary Cardiologist (physician) and Advanced Practice Providers (APPs -  Physician Assistants and Nurse Practitioners) who all work together to provide you with the care you need, when you need it. You will need a follow up appointment in 6 months.  Please call our office 2 months in advance to schedule this appointment.  You may see Glenetta Hew, MD or one of the following Advanced Practice Providers on your designated Care Team:   Rosaria Ferries, PA-C . Jory Sims, DNP, ANP  Any Other Special Instructions Will Be Listed Below (If Applicable).

## 2019-01-03 NOTE — Progress Notes (Signed)
PCP: Sharilyn Sites, MD  Clinic Note: Chief Complaint  Patient presents with  . Follow-up    No further chest pain;; has been more sedentary perioperatively.  . Coronary Artery Disease    No further angina.    HPI: Vickie [name] is a 73 y.o. female with a PMH below who presents today for ~5-6 month follow-up for history of CAD PCI of Diag branch in Sept 2018 for CP & + Abnormal Cardiac CTA (FFR + Diag).  She is being seen earlier today for routine follow-up.  Vickie [name] is a former Dr. Rollene Fare patient and before that Dr. Janene Madeira.  2007 : catheter induced coronary dissection of the nondominant RCA.    Every follow-up evaluation since had been fine up until last fall.    She also chronically has unusual chest pain episodes and dyspnea episodes.    She did very well until Sept 2018 --> anginal symptoms and had an abnormal Coronary CTA that led to cardiac catheterization & Diag PCI.  Vickie [name] was last seen in October 2019 for preoperative evaluation for eyelid surgery.  No further angina on Imdur and labetalol.  Was having some mild exertional dyspnea from COPD but nothing significant.  Was noting relatively normal blood pressures at home.  Remains on the [employer]ion-8 trial for dyslipidemia (unfortunately labs not available to me) --> no longer on closed label.  Trial is complete  Recent Hospitalizations: none  Being evaluated for Eyelid issues - has pending surgery next month  Studies Personally Reviewed - (if available, images/films reviewed: From Epic Chart or Care Everywhere)   no new studies  Interval History: Vickie [name] presents here today for 85-month follow-up after her surgery.  Pretty much since her surgery she has been relatively sedentary, partly because of limitations set on her by her surgeon.  She is therefore gained weight, she also acknowledges she has not been eating very well over the holiday season. She does still have her baseline dyspnea with mild  exertion dyspnea from COPD, but no change.  She does note some bruising.  Denies any recurrent anginal chest pain or pressure with rest or exertion.  Has not had to use any nitroglycerin in over a year.  Blood pressures have been stable. Edema has been pretty well controlled with her every other day Lasix.  Not really noticing that she has to take any additional doses.  No PND or orthopnea.  No rapid irregular heartbeats palpitations.  No syncope/near syncope or TIA/amaurosis fugax.  No claudication.  ROS: A comprehensive was performed.  Pertinent symptoms noted in HPI  Review of Systems  Constitutional: Negative for malaise/fatigue and weight loss (Weight gain).  HENT: Negative for congestion and nosebleeds.   Respiratory: Positive for cough, shortness of breath and wheezing.        Recently getting over a cold, but now getting better.  Gastrointestinal: Positive for heartburn (Usually controlled with Protonix but she is). Negative for abdominal pain, blood in stool, constipation, diarrhea and melena.  Genitourinary: Negative for dysuria and hematuria.  Musculoskeletal: Positive for joint pain. Negative for back pain.  Neurological: Positive for dizziness (Rarely now). Negative for headaches.  Psychiatric/Behavioral: The patient is nervous/anxious.        Lots of social stress as noted above.  All other systems reviewed and are negative.  I have reviewed and (if needed) personally updated the patient's problem list, medications, allergies, past medical and surgical history, social and family history.   Past Medical History:  Diagnosis Date  . Anginal pain ([place])   . Anxiety   . Anxiety disorder    With apparent panic attacks  . Arthritis    "hands, knees, back" (09/18/2017)  . CAD S/P percutaneous coronary angioplasty 08/2017   Single-vessel CAD involving second diagonal branch treated with DES stent Synergy 2.25 mm x 12 mm (2.4 mm)  . Chronic back pain    "all over" (09/18/2017)    . COPD (chronic obstructive pulmonary disease) ([place])    PFTs were done in 2008 at [facility]  . Depression   . Dyspnea   . Dysrhythmia    LBBB  . Essential hypertension   . GERD (gastroesophageal reflux disease)   . Hiatal hernia   . History of left bundle branch block (LBBB) 08/2017   Rate Related LBBB noted in cath lab  . Hyperlipidemia   . Macular degeneration    right eye  . Myocardial infarction [facility]) 2007   "medically induced"  . Nonocclusive coronary atherosclerosis of native coronary artery 2006 through 2012   multi caths 2012, 2006, 1950 9326 (7124 complicated by catheter-induced dissection of small nondominant RCA, patent in 2009 with no residual abnormality); Myoview August 2013: LOW RISK, normal EF. ; Echocardiogram [name] - January 2014) moderate LVH, EF 55-65%. No significant valvular disease.  . OSA (obstructive sleep apnea) 9/25/ 2012   tested 2009; tetested sleep study 07/2011--titration 09/20/2011 now use Bi-PAP  . OSA on CPAP   . Pneumonia    "several times in 2017; 3 times already this year" (09/18/2017)  . Scoliosis   . Type II diabetes mellitus ([place])     Past Surgical History:  Procedure Laterality Date  . CARDIAC CATHETERIZATION  [id number]; 2009   Non-occlusice CAD - only 80% ostial SP1;  NON DOMINANNT  RCA (catheter insuced dissection with MI in 2007 --> resolved by 2009 cath)  . CATARACT EXTRACTION, BILATERAL Bilateral 2017   Toric lens "in the left eye only"  . COLONOSCOPY WITH PROPOFOL N/A 11/09/2018   Procedure: COLONOSCOPY WITH PROPOFOL;  Surgeon: Rogene Houston, MD;  [facility]: [facility];  Service: Endoscopy;  Laterality: N/A;  2:25  . CORONARY STENT INTERVENTION N/A 09/18/2017   Procedure: CORONARY STENT INTERVENTION;  Surgeon: Leonie Man, MD;  Location: [facility] INVASIVE CV LAB: DES PCI Diag2: Synergy DES 2.25 mm x 12 mm (2.4 mm)  . DIAGNOSTIC LAPAROSCOPY Right   . DILATION AND CURETTAGE OF UTERUS    . EYE SURGERY    . INTRAVASCULAR  PRESSURE WIRE/FFR STUDY N/A 09/18/2017   Procedure: INTRAVASCULAR PRESSURE WIRE/FFR STUDY;  Surgeon: Leonie Man, MD;  Location: [place] CV LAB;  Service: Cardiovascular: FFR Diag 2 -- 0.78 (significcant) --> PCI  . KNEE ARTHROSCOPY Right   . LEFT HEART CATH AND CORONARY ANGIOGRAPHY N/A 09/18/2017   Procedure: LEFT HEART CATH AND CORONARY ANGIOGRAPHY;  Surgeon: Leonie Man, MD;  Location: [facility] INVASIVE CV LAB: Culpril - 80% Diag2 (FFR 0.78)- PCI.  pLAD 40%, pCx 40%. Post PCI LBBB. LVEDP - ~20 mmHg. EF 55-60%  . LIPOMA EXCISION Right ~ 2015   anterior abdomen  . PARS PLANA VITRECTOMY W/ REPAIR OF MACULAR HOLE Right    Unsuccessful repair.  Hole filled  . POLYPECTOMY  11/09/2018   Procedure: POLYPECTOMY;  Surgeon: Rogene Houston, MD;  Location: [facility];  Service: Endoscopy;;  colon  . VAGINAL HYSTERECTOMY      Left Heart Catheter Coronary Angiography-PCI November 18, 2017 Single vessel CAD  as per CT FFR. --> Successful FFR guided PCI of the Diag2 80% w/ Synergy DES 2.25 x 12 mm             Current Outpatient Medications on File Prior to Visit  Medication Sig Dispense Refill  . albuterol (PROAIR HFA) 108 (90 BASE) MCG/ACT inhaler Inhale 1 puff into the lungs every 6 (six) hours as needed for wheezing or shortness of breath.    Marland Kitchen albuterol (PROVENTIL) (2.5 MG/3ML) 0.083% nebulizer solution Take 2.5 mg by nebulization every 6 (six) hours as needed for wheezing or shortness of breath.     . ALPRAZolam (XANAX) 0.5 MG tablet Take 0.25 mg by mouth 2 (two) times daily.     . AMBULATORY NON FORMULARY MEDICATION Inject 300 mg into the skin as directed. Medication Name:Inclisiran sodium 300 mg vs placebo    . Biotin w/ Vitamins C & E (HAIR/SKIN/NAILS PO) Take 1 tablet by mouth at bedtime.    . busPIRone (BUSPAR) 15 MG tablet Take 15 mg by mouth 2 (two) times daily.    . Calcium Carb-Cholecalciferol (CALCIUM 600 + D PO) Take 1 tablet by mouth daily.     . Coenzyme Q10 (COQ10) 100 MG  CAPS Take 100 mg by mouth every morning.    Marland Kitchen CRANBERRY PO Take 1 capsule by mouth daily.     Marland Kitchen escitalopram (LEXAPRO) 20 MG tablet Take 20 mg by mouth every morning.     . furosemide (LASIX) 20 MG tablet TAKE ONE TABLET EVERY OTHER DAY. 60 tablet 2  . isosorbide mononitrate (IMDUR) 60 MG 24 hr tablet TAKE ONE TABLET BY MOUTH ONCE DAILY. 90 tablet 4  . labetalol (NORMODYNE) 100 MG tablet Take 1 tablet (100 mg total) by mouth 2 (two) times daily. 60 tablet 11  . mirtazapine (REMERON) 15 MG tablet Take 7.5 mg by mouth at bedtime.     . Multiple Vitamin (MULTIVITAMIN) tablet Take 1 tablet by mouth at bedtime.     . Omega-3 Fatty Acids (FISH OIL PO) Take 1 capsule by mouth at bedtime.    Marland Kitchen OVER THE COUNTER MEDICATION Take 1 tablet by mouth at bedtime. Restful Legs Supplement    . pantoprazole (PROTONIX) 40 MG tablet Take 1 tablet (40 mg total) by mouth daily. Please call office to make appointment 90 tablet 3  . potassium chloride SA (K-DUR,KLOR-CON) 20 MEQ tablet TAKE 1 TABLET EVERY OTHER DAY, TAKE ON DAYS THAT YOU TAKE FUROSEMIDE. 90 tablet 3  . pravastatin (PRAVACHOL) 40 MG tablet TAKE ONE TABLET BY MOUTH DAILY. (Patient taking differently: Take 40 mg by mouth daily. ) 30 tablet 3  . Tiotropium Bromide Monohydrate (SPIRIVA RESPIMAT) 2.5 MCG/ACT AERS Inhale 1 puff into the lungs 2 (two) times daily.      No current facility-administered medications on file prior to visit.     08/15/2018 [facility]    Eure, Koleen Distance, RN   Research study patient  Dx      Allergies  Allergen Reactions  . Adhesive [Tape] Other (See Comments)    REACTION: redness/irritation at application site. **Certain bandages/adhesives cause this reaction**  . Avelox [Moxifloxacin] Anaphylaxis  . Bactrim [Sulfamethoxazole-Trimethoprim] Anaphylaxis, Shortness Of Breath, Rash and Other (See Comments)    REACTION: Choking, inability to swallow, redness  . Levaquin [Levofloxacin] Anaphylaxis, Shortness  Of Breath, Rash and Other (See Comments)    Reaction:Choking Brand name Levaquin ok per pt  . Mucinex [Guaifenesin Er] Anaphylaxis, Shortness Of Breath and Rash  .  Penicillins Other (See Comments)    "Passed out" Has patient had a PCN reaction causing immediate rash, facial/tongue/throat swelling, SOB or lightheadedness with hypotension: Unknown Has patient had a PCN reaction causing severe rash involving mucus membranes or skin necrosis: No Has patient had a PCN reaction that required hospitalization: No Has patient had a PCN reaction occurring within the last 10 years: No If all of the above answers are "NO", then may proceed with Cephalosporin use.   [place] [Pseudoephedrine Hcl] Shortness Of Breath, Rash and Other (See Comments)    REACTION: Choking, redness, inability to swallow  . Crestor [Rosuvastatin] Other (See Comments)    Leg pain  . Lipitor [Atorvastatin] Other (See Comments)    Leg pain  . Vytorin [Ezetimibe-Simvastatin] Other (See Comments)    Leg pain  . Norvasc [Amlodipine] Other (See Comments)    unknown    Social History   Tobacco Use  . Smoking status: Former Smoker    Packs/day: 3.00    Years: 8.50    Pack years: 25.50    Types: Cigarettes    Last attempt to quit: 04/18/1976    Years since quitting: 42.7  . Smokeless tobacco: Never Used  Substance Use Topics  . Alcohol use: No  . Drug use: No   Social History   Social History Narrative   Married mother of 4, grandmother of 56. Her mother is 37 years old. Quit smoking 34 years ago. Does not drink alcohol.  Retired from [employer] in 2012.   Usually presents with oldest daughter.   Previously worked out at [employer] regularly walking 1/2-1 mile a day, but no longer able to do so because of the [employer] decision to no longer cover [employer] cost.    family history includes Breast cancer in her maternal aunt and maternal aunt; CAD in her mother.  Wt Readings from Last 3  Encounters:  01/03/19 185 lb 9.6 oz (84.2 kg)  11/13/18 177 lb 3.2 oz (80.4 kg)  11/09/18 181 lb (82.1 kg)    PHYSICAL EXAM BP 136/80   Pulse (!) 58   Ht 4\' 11"  (1.499 m)   Wt 185 lb 9.6 oz (84.2 kg)   BMI 37.49 kg/m  Physical Exam  Constitutional: She is oriented to person, place, and time. She appears well-developed and well-nourished. No distress.  Healthy-appearing.  Well-groomed  HENT:  Head: Normocephalic and atraumatic.  Neck: Normal range of motion. Neck supple. No hepatojugular reflux and no JVD present. Carotid bruit is not present.  Cardiovascular: Normal rate, regular rhythm and intact distal pulses.  Occasional extrasystoles are present. PMI is not displaced. Exam reveals no gallop and no friction rub.  Murmur heard.  Medium-pitched harsh crescendo-decrescendo early systolic murmur is present at the upper right sternal border radiating to the neck. Pulmonary/Chest: Effort normal. No respiratory distress. She has no rales.  Mild diffuse interstitial sounds, crackles but no rales or rhonchi  Abdominal: Soft. Bowel sounds are normal. She exhibits no distension. There is no abdominal tenderness. There is no rebound.  Musculoskeletal: Normal range of motion.        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 No edema.  Neurological: She is alert and oriented to person, place, and time.  Psychiatric: She has a normal mood and affect. Her behavior is normal. Judgment and thought content normal.  Nursing note and vitals reviewed.   Adult ECG Report Not  checked  Other studies Reviewed: Additional studies/ records that were reviewed today include:  Recent Labs:  --As her cardiologist, I am also blinded that she is labs have been ordered, but results not available.  Is due --> to complete the trial in August of this year (2019).  They will do reveal what medicine she is been on see results. Lab  Results  Component Value Date   CHOL 217 (H) 02/10/2017   HDL 53 02/10/2017   LDLCALC 121 (H) 02/10/2017   TRIG 214 (H) 02/10/2017   CHOLHDL 4.1 02/10/2017    ASSESSMENT / PLAN: Problem List Items Addressed This Visit    CAD S/P percutaneous coronary angioplasty (Chronic)    Essentially single-vessel disease with PCI to a small diagonal branch.  Now over 2 years post PCI.  Okay to discontinue Plavix and go on simply aspirin. Continue current dose of labetalol and Imdur.  Despite multiple attempts of trying to switch her from labetalol to a different beta-blocker she continues to be on labetalol.  Is on a injectable medication for hyperlipidemia along with statin.  Now off study.  Will need to follow-up lipids now.      Relevant Medications   aspirin EC 81 MG tablet   nitroGLYCERIN (NITROSTAT) 0.4 MG SL tablet   Coronary artery disease involving native coronary artery of native heart with angina pectoris (HCC) - Primary (Chronic)    Really no further anginal symptoms post PCI.  Is on current dose of Imdur doing well with no recurrent symptoms.  No use of nitroglycerin in over a year.  Continue current dose of beta-blocker and Imdur. Continue statin and recent trial medication.      Relevant Medications   aspirin EC 81 MG tablet   nitroGLYCERIN (NITROSTAT) 0.4 MG SL tablet   Other Relevant Orders   EKG 12-Lead (Completed)   Essential hypertension (Chronic)    Well-controlled on labetalol.  No longer on ACE inhibitor.  As long as her blood pressures are controlled, would continue with simply labetalol.  This seems to be doing the best job with her palpitations as well.      Relevant Medications   aspirin EC 81 MG tablet   nitroGLYCERIN (NITROSTAT) 0.4 MG SL tablet   Other Relevant Orders   EKG 12-Lead (Completed)   Hyperlipidemia LDL goal <70 (Chronic)    Now at the end of the [place] trial.  Is on pravastatin plus trial medication.  We will need to assess lipid panel.       Relevant Medications   aspirin EC 81 MG tablet   nitroGLYCERIN (NITROSTAT) 0.4 MG SL tablet   Obesity (BMI 30-39.9) (Chronic)    Has been sedentary because of her surgery.  Hopefully once she gets her next surgery done, she can then get back into an exercise regimen. Stressed the importance of dietary modification.      Pre-op evaluation    Did well with recent eyelid surgeries.  No recurrent anginal symptoms since last evaluation. No further cardiac evaluation needed prior to surgery.  Would be low risk patient for low risk surgery.  She is no longer on Plavix,         Current medicines are reviewed at length with the patient today. (+/- concerns)  -cholesterol medicine trial and is in August.  Which treatment will be revealed.  Results will be revealed. The following changes have been made: None  Patient Instructions  Medication Instructions:   STOP TAKING  PLAVIX -CLOPIDOGREL  START TAKING ASPIRIN 81 MG , BUT IF YOU USE ALEVE ON ANY DAY DO NOT TAKE ASPRIN 81 MG THAT DAY  If you need a refill on your cardiac medications before your next appointment, please call your pharmacy.   Lab work: NOT NEEDED If you have labs (blood work) drawn today and your tests are completely normal, you will receive your results only by: Marland Kitchen MyChart Message (if you have MyChart) OR . A paper copy in the mail If you have any lab test that is abnormal or we need to change your treatment, we will call you to review the results.  Testing/Procedures: NOT NEEDED  Follow-Up: At [facility], you and your health needs are our priority.  As part of our continuing mission to provide you with exceptional heart care, we have created designated Provider Care Teams.  These Care Teams include your primary Cardiologist (physician) and Advanced Practice Providers (APPs -  Physician Assistants and Nurse Practitioners) who all work together to provide you with the care you need, when you need it. You will need a  follow up appointment in 6 months.  Please call our office 2 months in advance to schedule this appointment.  You may see Glenetta Hew, MD or one of the following Advanced Practice Providers on your designated Care Team:   Rosaria Ferries, PA-C . Jory Sims, DNP, ANP  Any Other Special Instructions Will Be Listed Below (If Applicable).     Studies Ordered:   Orders Placed This Encounter  Procedures  . EKG 12-Lead      Glenetta Hew, M.D., M.S. Interventional Cardiologist   Pager # [phone] Phone # [phone] [place], [place] [address]   Thank you for choosing Heartcare at [facility]!!

## 2019-01-06 ENCOUNTER — Encounter: Payer: Self-pay | Admitting: Cardiology

## 2019-01-06 NOTE — Assessment & Plan Note (Signed)
Now at the end of the [facility] trial.  Is on pravastatin plus trial medication.  We will need to assess lipid panel.

## 2019-01-06 NOTE — Assessment & Plan Note (Signed)
Did well with recent eyelid surgeries.  No recurrent anginal symptoms since last evaluation. No further cardiac evaluation needed prior to surgery.  Would be low risk patient for low risk surgery.  She is no longer on Plavix,

## 2019-01-06 NOTE — Assessment & Plan Note (Signed)
Well-controlled on labetalol.  No longer on ACE inhibitor.  As long as her blood pressures are controlled, would continue with simply labetalol.  This seems to be doing the best job with her palpitations as well.

## 2019-01-06 NOTE — Assessment & Plan Note (Signed)
Really no further anginal symptoms post PCI.  Is on current dose of Imdur doing well with no recurrent symptoms.  No use of nitroglycerin in over a year.  Continue current dose of beta-blocker and Imdur. Continue statin and recent trial medication.

## 2019-01-06 NOTE — Assessment & Plan Note (Signed)
Essentially single-vessel disease with PCI to a small diagonal branch.  Now over 2 years post PCI.  Okay to discontinue Plavix and go on simply aspirin. Continue current dose of labetalol and Imdur.  Despite multiple attempts of trying to switch her from labetalol to a different beta-blocker she continues to be on labetalol.  Is on a injectable medication for hyperlipidemia along with statin.  Now off study.  Will need to follow-up lipids now.

## 2019-01-06 NOTE — Assessment & Plan Note (Signed)
Has been sedentary because of her surgery.  Hopefully once she gets her next surgery done, she can then get back into an exercise regimen. Stressed the importance of dietary modification.

## 2019-01-24 DIAGNOSIS — L84 Corns and callosities: Secondary | ICD-10-CM | POA: Diagnosis not present

## 2019-01-24 DIAGNOSIS — Z6836 Body mass index (BMI) 36.0-36.9, adult: Secondary | ICD-10-CM | POA: Diagnosis not present

## 2019-01-24 DIAGNOSIS — J340 Abscess, furuncle and carbuncle of nose: Secondary | ICD-10-CM | POA: Diagnosis not present

## 2019-01-24 DIAGNOSIS — J329 Chronic sinusitis, unspecified: Secondary | ICD-10-CM | POA: Diagnosis not present

## 2019-02-11 ENCOUNTER — Other Ambulatory Visit: Payer: Self-pay | Admitting: Cardiology

## 2019-02-13 ENCOUNTER — Other Ambulatory Visit: Payer: Self-pay | Admitting: Family Medicine

## 2019-02-13 DIAGNOSIS — Z1231 Encounter for screening mammogram for malignant neoplasm of breast: Secondary | ICD-10-CM

## 2019-02-25 DIAGNOSIS — E1140 Type 2 diabetes mellitus with diabetic neuropathy, unspecified: Secondary | ICD-10-CM | POA: Diagnosis not present

## 2019-02-25 DIAGNOSIS — E1151 Type 2 diabetes mellitus with diabetic peripheral angiopathy without gangrene: Secondary | ICD-10-CM | POA: Diagnosis not present

## 2019-03-21 DIAGNOSIS — J449 Chronic obstructive pulmonary disease, unspecified: Secondary | ICD-10-CM | POA: Diagnosis not present

## 2019-03-21 DIAGNOSIS — G4733 Obstructive sleep apnea (adult) (pediatric): Secondary | ICD-10-CM | POA: Diagnosis not present

## 2019-03-21 DIAGNOSIS — F419 Anxiety disorder, unspecified: Secondary | ICD-10-CM | POA: Diagnosis not present

## 2019-03-25 DIAGNOSIS — F419 Anxiety disorder, unspecified: Secondary | ICD-10-CM | POA: Diagnosis not present

## 2019-03-26 ENCOUNTER — Ambulatory Visit: Payer: Medicare Other

## 2019-03-30 DIAGNOSIS — J449 Chronic obstructive pulmonary disease, unspecified: Secondary | ICD-10-CM | POA: Diagnosis not present

## 2019-04-17 ENCOUNTER — Telehealth: Payer: Self-pay | Admitting: *Deleted

## 2019-04-17 NOTE — Telephone Encounter (Signed)
Called and cancelled upcoming appointment due to Covid 19 restrictions.  Will call and reschedule when clinic opens back up to visits

## 2019-05-02 ENCOUNTER — Other Ambulatory Visit: Payer: Self-pay | Admitting: Physician Assistant

## 2019-05-02 DIAGNOSIS — I10 Essential (primary) hypertension: Secondary | ICD-10-CM

## 2019-05-02 NOTE — Telephone Encounter (Signed)
Labetalol 100 mg refilled.

## 2019-05-13 DIAGNOSIS — E1140 Type 2 diabetes mellitus with diabetic neuropathy, unspecified: Secondary | ICD-10-CM | POA: Diagnosis not present

## 2019-05-13 DIAGNOSIS — E1151 Type 2 diabetes mellitus with diabetic peripheral angiopathy without gangrene: Secondary | ICD-10-CM | POA: Diagnosis not present

## 2019-05-22 ENCOUNTER — Other Ambulatory Visit: Payer: Self-pay | Admitting: Cardiology

## 2019-05-23 NOTE — Telephone Encounter (Signed)
refill 

## 2019-06-10 DIAGNOSIS — H524 Presbyopia: Secondary | ICD-10-CM | POA: Diagnosis not present

## 2019-06-10 DIAGNOSIS — Z961 Presence of intraocular lens: Secondary | ICD-10-CM | POA: Diagnosis not present

## 2019-06-10 DIAGNOSIS — H35341 Macular cyst, hole, or pseudohole, right eye: Secondary | ICD-10-CM | POA: Diagnosis not present

## 2019-06-10 DIAGNOSIS — E119 Type 2 diabetes mellitus without complications: Secondary | ICD-10-CM | POA: Diagnosis not present

## 2019-06-20 DIAGNOSIS — J449 Chronic obstructive pulmonary disease, unspecified: Secondary | ICD-10-CM | POA: Diagnosis not present

## 2019-06-25 ENCOUNTER — Other Ambulatory Visit: Payer: Self-pay

## 2019-06-25 ENCOUNTER — Ambulatory Visit
Admission: RE | Admit: 2019-06-25 | Discharge: 2019-06-25 | Disposition: A | Discharge status: Home / Self Care | Payer: Medicare Other | Source: Ambulatory Visit | Attending: Family Medicine | Admitting: Family Medicine

## 2019-06-25 DIAGNOSIS — Z1231 Encounter for screening mammogram for malignant neoplasm of breast: Secondary | ICD-10-CM | POA: Diagnosis not present

## 2019-07-03 ENCOUNTER — Other Ambulatory Visit: Payer: Self-pay

## 2019-07-03 ENCOUNTER — Encounter: Payer: Medicare Other | Admitting: *Deleted

## 2019-07-03 VITALS — BP 159/66 | HR 84 | Temp 97.0°F | Resp 18 | Wt 187.4 lb

## 2019-07-03 DIAGNOSIS — Z006 Encounter for examination for normal comparison and control in clinical research program: Secondary | ICD-10-CM

## 2019-07-08 NOTE — Research (Signed)
Subject to research clinic for visit 3 Day 270 in the [place]p 8 research trial.  No cos, aes or saes to report.  Injection given and subject remained in clinic for 30 minutes post for observation.  Next visit scheduled and subject experienced no events and left for home

## 2019-07-22 DIAGNOSIS — E1140 Type 2 diabetes mellitus with diabetic neuropathy, unspecified: Secondary | ICD-10-CM | POA: Diagnosis not present

## 2019-07-22 DIAGNOSIS — E1151 Type 2 diabetes mellitus with diabetic peripheral angiopathy without gangrene: Secondary | ICD-10-CM | POA: Diagnosis not present

## 2019-07-23 DIAGNOSIS — J449 Chronic obstructive pulmonary disease, unspecified: Secondary | ICD-10-CM | POA: Diagnosis not present

## 2019-07-23 DIAGNOSIS — Z1389 Encounter for screening for other disorder: Secondary | ICD-10-CM | POA: Diagnosis not present

## 2019-07-23 DIAGNOSIS — F329 Major depressive disorder, single episode, unspecified: Secondary | ICD-10-CM | POA: Diagnosis not present

## 2019-07-23 DIAGNOSIS — M1991 Primary osteoarthritis, unspecified site: Secondary | ICD-10-CM | POA: Diagnosis not present

## 2019-07-23 DIAGNOSIS — Z681 Body mass index (BMI) 19 or less, adult: Secondary | ICD-10-CM | POA: Diagnosis not present

## 2019-07-23 DIAGNOSIS — Z0000 Encounter for general adult medical examination without abnormal findings: Secondary | ICD-10-CM | POA: Diagnosis not present

## 2019-07-23 DIAGNOSIS — E119 Type 2 diabetes mellitus without complications: Secondary | ICD-10-CM | POA: Diagnosis not present

## 2019-08-02 DIAGNOSIS — G4733 Obstructive sleep apnea (adult) (pediatric): Secondary | ICD-10-CM | POA: Diagnosis not present

## 2019-08-02 DIAGNOSIS — I10 Essential (primary) hypertension: Secondary | ICD-10-CM | POA: Diagnosis not present

## 2019-08-02 DIAGNOSIS — J449 Chronic obstructive pulmonary disease, unspecified: Secondary | ICD-10-CM | POA: Diagnosis not present

## 2019-08-02 DIAGNOSIS — J301 Allergic rhinitis due to pollen: Secondary | ICD-10-CM | POA: Diagnosis not present

## 2019-08-28 ENCOUNTER — Other Ambulatory Visit: Payer: Self-pay

## 2019-08-28 ENCOUNTER — Encounter: Payer: Self-pay | Admitting: Cardiology

## 2019-08-28 ENCOUNTER — Ambulatory Visit (INDEPENDENT_AMBULATORY_CARE_PROVIDER_SITE_OTHER): Payer: Medicare Other | Admitting: Cardiology

## 2019-08-28 VITALS — BP 141/77 | HR 65 | Ht 59.0 in | Wt 186.2 lb

## 2019-08-28 DIAGNOSIS — I739 Peripheral vascular disease, unspecified: Secondary | ICD-10-CM

## 2019-08-28 DIAGNOSIS — E669 Obesity, unspecified: Secondary | ICD-10-CM

## 2019-08-28 DIAGNOSIS — I10 Essential (primary) hypertension: Secondary | ICD-10-CM

## 2019-08-28 DIAGNOSIS — I2510 Atherosclerotic heart disease of native coronary artery without angina pectoris: Secondary | ICD-10-CM

## 2019-08-28 DIAGNOSIS — Z9861 Coronary angioplasty status: Secondary | ICD-10-CM

## 2019-08-28 DIAGNOSIS — E785 Hyperlipidemia, unspecified: Secondary | ICD-10-CM

## 2019-08-28 DIAGNOSIS — I25119 Atherosclerotic heart disease of native coronary artery with unspecified angina pectoris: Secondary | ICD-10-CM

## 2019-08-28 DIAGNOSIS — R600 Localized edema: Secondary | ICD-10-CM

## 2019-08-28 NOTE — Patient Instructions (Addendum)
Medication Instructions:  NO CHANGES  If you need a refill on your cardiac medications before your next appointment, please call your pharmacy.   Lab work: NOT NEEDED    Testing/Procedures:  NOT NEEDED  Follow-Up: At [facility], you and your health needs are our priority.  As part of our continuing mission to provide you with exceptional heart care, we have created designated Provider Care Teams.  These Care Teams include your primary Cardiologist (physician) and Advanced Practice Providers (APPs -  Physician Assistants and Nurse Practitioners) who all work together to provide you with the care you need, when you need it.  . You will need a follow up appointment in  12 month SEPT 2021.  Please call our office 2 months in advance to schedule this appointment.  You may see Vickie Hew, MD or one of the following Advanced Practice Providers on your designated Care Team:   . Vickie Ferries, PA-C . Vickie Sims, DNP, ANP  Any Other Special Instructions Will Be Listed Below.   WATCH EATING HABITS - RECOMMEND  EXERCISE AT LEAST 30 MIN  A DAY --  YOU MAY BREAK UP THE TIME THROUGHOUT THE DAY.

## 2019-08-28 NOTE — Progress Notes (Signed)
PCP: Sharilyn Sites, MD  Clinic Note: Chief Complaint  Patient presents with   Follow-up    44-month   Coronary Artery Disease   Shortness of Breath    HPI: Vickie [name] is a 73 y.o. female with a PMH below who presents today for ~5-6 month follow-up for history of CAD PCI of Diag branch in Sept 2018 for CP & + Abnormal Cardiac CTA (FFR + Diag).  She is being seen earlier today for routine follow-up.  Vickie [name] is a former Dr. Rollene Fare patient and before that Dr. Janene Madeira.  2007 : catheter induced coronary dissection of the nondominant RCA.    Every follow-up evaluation since had been fine up until last fall.    She also chronically has unusual chest pain episodes and dyspnea episodes.    She did very well until Sept 2018 --> anginal symptoms and had an abnormal Coronary CTA that led to cardiac catheterization & Diag PCI.  Currently enrolled in the [facility] [place] was last seen in in Jan 2019 --was actually a preop evaluation for her second eyelid surgery.  Unfortunately this did not happen -initially rescheduled to do COVID-19 lockdown, and now further delayed because her daughter just recently underwent hip surgery. -> She was doing relatively well had been very sedentary.  Had some baseline dyspnea.  No chest pain.  No CHF symptoms. --> She is now on the open label portion of her ORION trial -> switched from placebo to treatment arm.  Recent Hospitalizations: none  none  Studies Personally Reviewed - (if available, images/films reviewed: From Epic Chart or Care Everywhere)  none  Interval History: Vickie [name] presents here today for follow-up actually doing pretty well.  She is does have her exertional dyspnea which is made worse with wearing her mask.  She does not do well in the heat, especially wearing mask.  She also [place] and has some chest tightness which may or may not be associated with activity.  Despite this, she is always on the  go.  She is trying to do some yard work.  She.  She is the one going to the store for shopping.  She started getting little frustrated, having gained weight in the initial part of the COVID-19 lockdown.  She is now trying to pick up her exercise level 1 diet change to try to lose the weight back again.  No symptoms concerning for her previous anginal symptoms with rest or exertion -> she has not yet considered to use nitroglycerin.  The exertional dyspnea is very similar to it with her baseline. No PND, orthopnea or edema (the left leg has some swelling, but is chronic).  She does have some intermittent skipped beats, but no prolonged rapid irregular heartbeat/palpitations.  No syncope/near syncope or TIA/amaurosis fugax.  No melena, hematochezia, hematuria or epistaxis--just some mild bruising.  No claudication..  ROS: A comprehensive was performed.  Pertinent symptoms noted in HPI  Review of Systems  Constitutional: Negative for malaise/fatigue and weight loss (Weight gain).  HENT: Negative for congestion and nosebleeds.   Respiratory: Positive for shortness of breath and wheezing (Certain allergies trigger it, and she has some issues with wheezing wearing her mask and walking.). Negative for cough.   Gastrointestinal: Positive for heartburn (Usually controlled with Protonix but she is). Negative for abdominal pain, blood in stool, constipation, diarrhea and melena.  Genitourinary: Negative for dysuria and hematuria.  Musculoskeletal: Positive for joint pain. Negative for back pain  and falls.  Neurological: Positive for dizziness (Rarely now). Negative for weakness and headaches.  Psychiatric/Behavioral: Negative for memory loss. The patient is nervous/anxious. The patient does not have insomnia.        Still has social stress, but now is more driven because of the COVID-19 restrictions.  All other systems reviewed and are negative.  The patient does not have symptoms concerning for COVID-19  infection (fever, chills, cough, or new shortness of breath).  The patient is practicing social distancing.   COVID-19 Education: The signs and symptoms of COVID-19 were discussed with the patient and how to seek care for testing (follow up with PCP or arrange E-visit).   The importance of social distancing was discussed today.   I have reviewed and (if needed) personally updated the patient's problem list, medications, allergies, past medical and surgical history, social and family history.   Past Medical History:  Diagnosis Date   Anginal pain ([place])    Anxiety    Anxiety disorder    With apparent panic attacks   Arthritis    "hands, knees, back" (09/18/2017)   CAD S/P percutaneous coronary angioplasty 08/2017   Single-vessel CAD involving second diagonal branch treated with DES stent Synergy 2.25 mm x 12 mm (2.4 mm)   Chronic back pain    "all over" (09/18/2017)   COPD (chronic obstructive pulmonary disease) ([place])    PFTs were done in 2008 at [facility]   Depression    Dyspnea    Dysrhythmia    LBBB   Essential hypertension    GERD (gastroesophageal reflux disease)    Hiatal hernia    History of left bundle branch block (LBBB) 08/2017   Rate Related LBBB noted in cath lab   Hyperlipidemia    Macular degeneration    right eye   Myocardial infarction [facility]) 2007   "medically induced"   Nonocclusive coronary atherosclerosis of native coronary artery 2006 through 2012   multi caths 2012, 2006, [id number] ([id number] complicated by catheter-induced dissection of small nondominant RCA, patent in 2009 with no residual abnormality); Myoview August 2013: LOW RISK, normal EF. ; Echocardiogram [name] - January 2014) moderate LVH, EF 55-65%. No significant valvular disease.   OSA (obstructive sleep apnea) 9/25/ 2012   tested 2009; tetested sleep study 07/2011--titration 09/20/2011 now use Bi-PAP   OSA on CPAP    Pneumonia    "several times in 2017; 3 times already  this year" (09/18/2017)   Scoliosis    Type II diabetes mellitus [facility])     Past Surgical History:  Procedure Laterality Date   CARDIAC CATHETERIZATION  2006,2007; 2009   Non-occlusice CAD - only 80% ostial SP1;  NON DOMINANNT  RCA (catheter insuced dissection with MI in 2007 --> resolved by 2009 cath)   CATARACT EXTRACTION, BILATERAL Bilateral 2017   Toric lens "in the left eye only"   COLONOSCOPY WITH PROPOFOL N/A 11/09/2018   Procedure: COLONOSCOPY WITH PROPOFOL;  Surgeon: Rogene Houston, MD;  Location: [facility];  Service: Endoscopy;  Laterality: N/A;  2:25   CORONARY STENT INTERVENTION N/A 09/18/2017   Procedure: CORONARY STENT INTERVENTION;  Surgeon: Leonie Man, MD;  Location: [facility] INVASIVE CV LAB: DES PCI Diag2: Synergy DES 2.25 mm x 12 mm (2.4 mm)   DIAGNOSTIC LAPAROSCOPY Right    DILATION AND CURETTAGE OF UTERUS     EYE SURGERY     INTRAVASCULAR PRESSURE WIRE/FFR STUDY N/A 09/18/2017   Procedure: INTRAVASCULAR PRESSURE WIRE/FFR STUDY;  Surgeon:  Leonie Man, MD;  Location: [place] CV LAB;  Service: Cardiovascular: FFR Diag 2 -- 0.78 (significcant) --> PCI   KNEE ARTHROSCOPY Right    LEFT HEART CATH AND CORONARY ANGIOGRAPHY N/A 09/18/2017   Procedure: LEFT HEART CATH AND CORONARY ANGIOGRAPHY;  Surgeon: Leonie Man, MD;  Location: [facility] INVASIVE CV LAB: Culpril - 80% Diag2 (FFR 0.78)- PCI.  pLAD 40%, pCx 40%. Post PCI LBBB. LVEDP - ~20 mmHg. EF 55-60%   LIPOMA EXCISION Right ~ 2015   anterior abdomen   PARS PLANA VITRECTOMY W/ REPAIR OF MACULAR HOLE Right    Unsuccessful repair.  Hole filled   POLYPECTOMY  11/09/2018   Procedure: POLYPECTOMY;  Surgeon: Rogene Houston, MD;  Location: [facility];  Service: Endoscopy;;  colon   VAGINAL HYSTERECTOMY      Left Heart Catheter Coronary Angiography-PCI November 18, 2017 Single vessel CAD as per CT FFR. --> Successful FFR guided PCI of the Diag2 80% w/ Synergy DES 2.25 x 12 mm              Current Outpatient Medications on File Prior to Visit  Medication Sig Dispense Refill   albuterol (PROAIR HFA) 108 (90 BASE) MCG/ACT inhaler Inhale 1 puff into the lungs every 6 (six) hours as needed for wheezing or shortness of breath.     ALPRAZolam (XANAX) 0.5 MG tablet Take 0.25 mg by mouth 2 (two) times daily.      AMBULATORY NON FORMULARY MEDICATION Inject 300 mg into the skin as directed. Medication Name:Inclisiran sodium 300 mg vs placebo     Biotin w/ Vitamins C & E (HAIR/SKIN/NAILS PO) Take 1 tablet by mouth at bedtime.     busPIRone (BUSPAR) 15 MG tablet Take 15 mg by mouth 2 (two) times daily.     Calcium Carb-Cholecalciferol (CALCIUM 600 + D PO) Take 1 tablet by mouth daily.      Coenzyme Q10 (COQ10) 100 MG CAPS Take 100 mg by mouth every morning.     CRANBERRY PO Take 1 capsule by mouth daily.      escitalopram (LEXAPRO) 20 MG tablet Take 20 mg by mouth every morning.      furosemide (LASIX) 20 MG tablet TAKE ONE TABLET EVERY OTHER DAY. 60 tablet 2   isosorbide mononitrate (IMDUR) 60 MG 24 hr tablet TAKE ONE TABLET BY MOUTH ONCE DAILY. 90 tablet 4   labetalol (NORMODYNE) 100 MG tablet TAKE 1 TABLET BY MOUTH TWICE DAILY. 60 tablet 5   mirtazapine (REMERON) 15 MG tablet Take 7.5 mg by mouth at bedtime.      Multiple Vitamin (MULTIVITAMIN) tablet Take 1 tablet by mouth at bedtime.      nitroGLYCERIN (NITROSTAT) 0.4 MG SL tablet PLACE 1 TAB UNDER TONGUE EVERY 5 MIN IF NEEDED FOR CHEST PAIN. MAY USE 3 TIMES.NO RELIEF CALL 911. 25 tablet 5   Omega-3 Fatty Acids (FISH OIL PO) Take 1 capsule by mouth at bedtime.     OVER THE COUNTER MEDICATION Take 1 tablet by mouth at bedtime. Restful Legs Supplement     pantoprazole (PROTONIX) 40 MG tablet TAKE ONE TABLET BY MOUTH ONCE DAILY. 90 tablet 1   potassium chloride SA (K-DUR,KLOR-CON) 20 MEQ tablet TAKE 1 TABLET EVERY OTHER DAY, TAKE ON DAYS THAT YOU TAKE FUROSEMIDE. 90 tablet 3   pravastatin (PRAVACHOL) 40 MG tablet  TAKE ONE TABLET BY MOUTH DAILY. 90 tablet 3   Tiotropium Bromide Monohydrate (SPIRIVA RESPIMAT) 2.5 MCG/ACT AERS Inhale 1 puff into the  lungs 2 (two) times daily.      No current facility-administered medications on file prior to visit.     08/15/2018 [facility]      Research study patient  Dx   Cathlean Cower, RN      Allergies  Allergen Reactions   Adhesive [Tape] Other (See Comments)    REACTION: redness/irritation at application site. **Certain bandages/adhesives cause this reaction**   Avelox [Moxifloxacin] Anaphylaxis   Bactrim [Sulfamethoxazole-Trimethoprim] Anaphylaxis, Shortness Of Breath, Rash and Other (See Comments)    REACTION: Choking, inability to swallow, redness   Levaquin [Levofloxacin] Anaphylaxis, Shortness Of Breath, Rash and Other (See Comments)    Reaction:Choking Brand name Levaquin ok per pt   Mucinex [Guaifenesin Er] Anaphylaxis, Shortness Of Breath and Rash   Penicillins Other (See Comments)    "Passed out" Has patient had a PCN reaction causing immediate rash, facial/tongue/throat swelling, SOB or lightheadedness with hypotension: Unknown Has patient had a PCN reaction causing severe rash involving mucus membranes or skin necrosis: No Has patient had a PCN reaction that required hospitalization: No Has patient had a PCN reaction occurring within the last 10 years: No If all of the above answers are "NO", then may proceed with Cephalosporin use.    Sudafed [Pseudoephedrine Hcl] Shortness Of Breath, Rash and Other (See Comments)    REACTION: Choking, redness, inability to swallow   Crestor [Rosuvastatin] Other (See Comments)    Leg pain   Lipitor [Atorvastatin] Other (See Comments)    Leg pain   Vytorin [Ezetimibe-Simvastatin] Other (See Comments)    Leg pain   Norvasc [Amlodipine] Other (See Comments)    unknown    Social History   Tobacco Use   Smoking status: Former Smoker    Packs/day: 3.00    Years:  8.50    Pack years: 25.50    Types: Cigarettes    Quit date: 04/18/1976    Years since quitting: 43.3   Smokeless tobacco: Never Used  Substance Use Topics   Alcohol use: No   Drug use: No   Social History   Social History Narrative   Married mother of 4, grandmother of 14. Her mother is 78 years old. Quit smoking 34 years ago. Does not drink alcohol.  Retired from [employer] in 2012.   Usually presents with oldest daughter.   Previously worked out at [employer] regularly walking 1/2-1 mile a day, but no longer able to do so because of the [employer] decision to no longer cover [employer] cost.    family history includes Breast cancer in her maternal aunt and paternal aunt; CAD in her mother.  Wt Readings from Last 3 Encounters:  08/28/19 186 lb 3.2 oz (84.5 kg)  07/03/19 187 lb 6.4 oz (85 kg)  01/03/19 185 lb 9.6 oz (84.2 kg)    PHYSICAL EXAM BP (!) 141/77    Pulse 65    Ht 4\' 11"  (1.499 m)    Wt 186 lb 3.2 oz (84.5 kg)    SpO2 92%    BMI 37.61 kg/m  Physical Exam  Constitutional: She is oriented to person, place, and time. She appears well-developed and well-nourished. No distress.  Healthy-appearing.  Well-groomed  HENT:  Head: Normocephalic and atraumatic.  Neck: Normal range of motion. Neck supple. No hepatojugular reflux and no JVD present. Carotid bruit is not present.  Cardiovascular: Normal rate, regular rhythm and intact distal pulses.  Occasional extrasystoles are present. PMI is not displaced.  Exam reveals no gallop and no friction rub.  Murmur heard.  Medium-pitched harsh crescendo-decrescendo early systolic murmur is present at the upper right sternal border radiating to the neck. Pulmonary/Chest: Effort normal. No respiratory distress. She has no rales.  Mild diffuse interstitial sounds, crackles but no rales or rhonchi  Abdominal: Soft. Bowel sounds are normal. She exhibits no distension. There is no abdominal tenderness. There is  no rebound.  Musculoskeletal: Normal range of motion.        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 No edema.  Neurological: She is alert and oriented to person, place, and time.  Psychiatric: She has a normal mood and affect. Her behavior is normal. Judgment and thought content normal.  Nursing note and vitals reviewed.   Adult ECG Report Not checked  Other studies Reviewed: Additional studies/ records that were reviewed today include:  Recent Labs:  --As her cardiologist, I am also blinded that she is labs have been ordered, but results not available.  Is due --> to complete the trial in August of this year (2019).  They will do reveal what medicine she is been on see results. Lab Results  Component Value Date   CHOL 217 (H) 02/10/2017   HDL 53 02/10/2017   LDLCALC 121 (H) 02/10/2017   TRIG 214 (H) 02/10/2017   CHOLHDL 4.1 02/10/2017    ASSESSMENT / PLAN: Problem List Items Addressed This Visit    Obesity (BMI 30-39.9) (Chronic)    I encouraged her to start getting back active and exercising.  I think her biggest fear now is try to do things in the heat and that wearing the mask exacerbates her COPD related breathing issues.  I told her that she should try to go walk inside for instance at [facility] or [employer] where there is still the ability to distance from other people.  I also told her that when she walks up to a store for instance, she should stay outside briefly take the mask down to catch her breath before going in with a mask back on.  In fact if no breathes around, she should not wear the mask outside and only put it on when she goes inside).      Hyperlipidemia LDL goal <70 (Chronic)    Now on the open label portion of the [facility] trial.  She is on Inclisiran, fish oil and pravastatin. Has been no longer blinded, her labs from July are outstanding.  LDL is 23 with an HDL of 53 and total  cholesterol 102.      Relevant Orders   EKG 12-Lead   Essential hypertension (Chronic)    Borderline blood pressure today.  But she says usually at home it is better than that.  She is only on low-dose labetalol as well as Imdur.  Has had some dizziness and lightheadedness therefore will allow for mild permissive hypertension.      Relevant Orders   EKG 12-Lead   Coronary artery disease involving native coronary artery of native heart with angina pectoris ([place]) - Primary (Chronic)    No further angina since PCI.  Is on long-acting nitrate labetalol at low-dose.  On aggressive lipid-lowering regimen.      Relevant Orders   EKG 12-Lead   Claudication (HCC) (Chronic)   CAD S/P percutaneous coronary angioplasty (Chronic)    She is now 2 years out from her PCI.  Is on labetalol (despite multiple efforts of  trying to convert her to a different medicine suggestions upon).  She was supposed to be on aspirin alone, but because of significant bruising even that was stopped.  I would like for her to try to take aspirin every other day.  Lipids are extremely well controlled.  She is on Imdur and no further anginal symptoms.      Relevant Orders   EKG 12-Lead   Bilateral lower extremity edema (Chronic)    Pretty well controlled on her current dosage of furosemide.  She takes 20 mg every other day.         Current medicines are reviewed at length with the patient today. (+/- concerns)  -cholesterol medicine trial and is in August.  Which treatment will be revealed.  Results will be revealed. The following changes have been made: None  Patient Instructions  Medication Instructions:  NO CHANGES  If you need a refill on your cardiac medications before your next appointment, please call your pharmacy.   Lab work: NOT NEEDED    Testing/Procedures:  NOT NEEDED  Follow-Up: At [facility], you and your health needs are our priority.  As part of our continuing mission to provide you  with exceptional heart care, we have created designated Provider Care Teams.  These Care Teams include your primary Cardiologist (physician) and Advanced Practice Providers (APPs -  Physician Assistants and Nurse Practitioners) who all work together to provide you with the care you need, when you need it.   You will need a follow up appointment in  12 month SEPT 2021.  Please call our office 2 months in advance to schedule this appointment.  You may see Glenetta Hew, MD or one of the following Advanced Practice Providers on your designated Care Team:    Rosaria Ferries, PA-C  Jory Sims, DNP, ANP  Any Other Special Instructions Will Be Listed Below.   WATCH EATING HABITS - RECOMMEND  EXERCISE AT LEAST 30 MIN  A DAY --  YOU MAY BREAK UP THE TIME THROUGHOUT THE DAY.  Studies Ordered:   Orders Placed This Encounter  Procedures   EKG 12-Lead      Glenetta Hew, M.D., M.S. Interventional Cardiologist   Pager # [phone] Phone # [phone] [address] [place], [place] [address]   Thank you for choosing Heartcare at [facility]!!

## 2019-08-29 ENCOUNTER — Encounter: Payer: Self-pay | Admitting: Cardiology

## 2019-08-29 NOTE — Assessment & Plan Note (Signed)
Pretty well controlled on her current dosage of furosemide.  She takes 20 mg every other day.

## 2019-08-29 NOTE — Assessment & Plan Note (Signed)
No further angina since PCI.  Is on long-acting nitrate labetalol at low-dose.  On aggressive lipid-lowering regimen.

## 2019-08-29 NOTE — Assessment & Plan Note (Signed)
I encouraged her to start getting back active and exercising.  I think her biggest fear now is try to do things in the heat and that wearing the mask exacerbates her COPD related breathing issues.  I told her that she should try to go walk inside for instance at [facility] or [employer] where there is still the ability to distance from other people.  I also told her that when she walks up to a store for instance, she should stay outside briefly take the mask down to catch her breath before going in with a mask back on.  In fact if no breathes around, she should not wear the mask outside and only put it on when she goes inside).

## 2019-08-29 NOTE — Assessment & Plan Note (Signed)
Borderline blood pressure today.  But she says usually at home it is better than that.  She is only on low-dose labetalol as well as Imdur.  Has had some dizziness and lightheadedness therefore will allow for mild permissive hypertension.

## 2019-08-29 NOTE — Assessment & Plan Note (Signed)
She is now 2 years out from her PCI.  Is on labetalol (despite multiple efforts of trying to convert her to a different medicine suggestions upon).  She was supposed to be on aspirin alone, but because of significant bruising even that was stopped.  I would like for her to try to take aspirin every other day.  Lipids are extremely well controlled.  She is on Imdur and no further anginal symptoms.

## 2019-08-29 NOTE — Assessment & Plan Note (Signed)
Now on the open label portion of the [facility] trial.  She is on Inclisiran, fish oil and pravastatin. Has been no longer blinded, her labs from July are outstanding.  LDL is 23 with an HDL of 53 and total cholesterol 102.

## 2019-09-30 DIAGNOSIS — E1140 Type 2 diabetes mellitus with diabetic neuropathy, unspecified: Secondary | ICD-10-CM | POA: Diagnosis not present

## 2019-09-30 DIAGNOSIS — E1151 Type 2 diabetes mellitus with diabetic peripheral angiopathy without gangrene: Secondary | ICD-10-CM | POA: Diagnosis not present

## 2019-10-04 ENCOUNTER — Other Ambulatory Visit: Payer: Self-pay

## 2019-10-04 DIAGNOSIS — Z20828 Contact with and (suspected) exposure to other viral communicable diseases: Secondary | ICD-10-CM | POA: Diagnosis not present

## 2019-10-04 DIAGNOSIS — Z20822 Contact with and (suspected) exposure to covid-19: Secondary | ICD-10-CM

## 2019-10-06 LAB — NOVEL CORONAVIRUS, NAA: SARS-CoV-2, NAA: NOT DETECTED

## 2019-10-16 DIAGNOSIS — Z23 Encounter for immunization: Secondary | ICD-10-CM | POA: Diagnosis not present

## 2019-11-01 DIAGNOSIS — I10 Essential (primary) hypertension: Secondary | ICD-10-CM | POA: Diagnosis not present

## 2019-11-01 DIAGNOSIS — J441 Chronic obstructive pulmonary disease with (acute) exacerbation: Secondary | ICD-10-CM | POA: Diagnosis not present

## 2019-11-01 DIAGNOSIS — G4733 Obstructive sleep apnea (adult) (pediatric): Secondary | ICD-10-CM | POA: Diagnosis not present

## 2019-11-01 DIAGNOSIS — E119 Type 2 diabetes mellitus without complications: Secondary | ICD-10-CM | POA: Diagnosis not present

## 2019-11-06 DIAGNOSIS — F419 Anxiety disorder, unspecified: Secondary | ICD-10-CM | POA: Diagnosis not present

## 2019-11-06 DIAGNOSIS — M25511 Pain in right shoulder: Secondary | ICD-10-CM | POA: Diagnosis not present

## 2019-11-06 DIAGNOSIS — E6609 Other obesity due to excess calories: Secondary | ICD-10-CM | POA: Diagnosis not present

## 2019-11-06 DIAGNOSIS — Z6835 Body mass index (BMI) 35.0-35.9, adult: Secondary | ICD-10-CM | POA: Diagnosis not present

## 2019-11-23 ENCOUNTER — Other Ambulatory Visit: Payer: Self-pay | Admitting: Cardiology

## 2019-11-27 ENCOUNTER — Encounter: Payer: Medicare Other | Admitting: *Deleted

## 2019-11-27 ENCOUNTER — Other Ambulatory Visit: Payer: Self-pay

## 2019-11-27 VITALS — BP 144/61 | HR 58 | Temp 97.0°F | Resp 20 | Wt 180.6 lb

## 2019-11-27 DIAGNOSIS — Z006 Encounter for examination for normal comparison and control in clinical research program: Secondary | ICD-10-CM

## 2019-11-27 NOTE — Research (Addendum)
ERROR

## 2019-11-27 NOTE — Research (Signed)
Subject to research clinic for visit day 450 in the [place] research study.  No aes or saes to report.  Injection given and patient remained in clinic for 30 minutes post injection. Pt tolerated well and next visit scheduled.

## 2019-12-14 ENCOUNTER — Other Ambulatory Visit: Payer: Self-pay | Admitting: Cardiology

## 2019-12-16 NOTE — Telephone Encounter (Signed)
Rx(s) sent to pharmacy electronically.  

## 2019-12-21 ENCOUNTER — Ambulatory Visit: Payer: Self-pay

## 2019-12-21 ENCOUNTER — Other Ambulatory Visit: Payer: Self-pay | Admitting: Cardiology

## 2019-12-21 NOTE — Telephone Encounter (Signed)
Pt exposed to Covid Monday from Granddaughter. Pt informed that she needs to get covid test and to monitor for signs of SOB or breathing difficulty and to monitor for signs of dehydration. Pt advised to call 911 for those sx. Pt is high risk. Web site for scheduling appt given to pt and pt stated she will have her daughter make an appointment. Pt verbalized understanding.  Reason for Disposition . Health Information question, no triage required and triager able to answer question  Answer Assessment - Initial Assessment Questions 1. REASON FOR CALL or QUESTION: "What is your reason for calling today?" or "How can I best help you?" or "What question do you have that I can help answer?"     Covid question about exposure.  Protocols used: INFORMATION ONLY CALL-A-AH

## 2019-12-24 DIAGNOSIS — J4530 Mild persistent asthma, uncomplicated: Secondary | ICD-10-CM | POA: Diagnosis not present

## 2019-12-24 DIAGNOSIS — Z20828 Contact with and (suspected) exposure to other viral communicable diseases: Secondary | ICD-10-CM | POA: Diagnosis not present

## 2019-12-27 ENCOUNTER — Inpatient Hospital Stay (HOSPITAL_COMMUNITY): Payer: Medicare Other

## 2019-12-27 ENCOUNTER — Encounter (HOSPITAL_COMMUNITY): Payer: Self-pay | Admitting: *Deleted

## 2019-12-27 ENCOUNTER — Other Ambulatory Visit: Payer: Self-pay

## 2019-12-27 ENCOUNTER — Inpatient Hospital Stay (HOSPITAL_COMMUNITY)
Admission: EM | Admit: 2019-12-27 | Discharge: 2020-01-03 | DRG: 177 | Disposition: A | Discharge status: Home Health | Payer: Medicare Other | Attending: Internal Medicine | Admitting: Internal Medicine

## 2019-12-27 ENCOUNTER — Emergency Department (HOSPITAL_COMMUNITY): Payer: Medicare Other

## 2019-12-27 DIAGNOSIS — J440 Chronic obstructive pulmonary disease with acute lower respiratory infection: Secondary | ICD-10-CM | POA: Diagnosis present

## 2019-12-27 DIAGNOSIS — R509 Fever, unspecified: Secondary | ICD-10-CM | POA: Diagnosis not present

## 2019-12-27 DIAGNOSIS — R918 Other nonspecific abnormal finding of lung field: Secondary | ICD-10-CM | POA: Diagnosis not present

## 2019-12-27 DIAGNOSIS — I2510 Atherosclerotic heart disease of native coronary artery without angina pectoris: Secondary | ICD-10-CM | POA: Diagnosis not present

## 2019-12-27 DIAGNOSIS — F329 Major depressive disorder, single episode, unspecified: Secondary | ICD-10-CM | POA: Diagnosis not present

## 2019-12-27 DIAGNOSIS — I700 Atherosclerosis of aorta: Secondary | ICD-10-CM | POA: Diagnosis not present

## 2019-12-27 DIAGNOSIS — D6959 Other secondary thrombocytopenia: Secondary | ICD-10-CM | POA: Diagnosis present

## 2019-12-27 DIAGNOSIS — F410 Panic disorder [episodic paroxysmal anxiety] without agoraphobia: Secondary | ICD-10-CM | POA: Diagnosis not present

## 2019-12-27 DIAGNOSIS — Z87891 Personal history of nicotine dependence: Secondary | ICD-10-CM

## 2019-12-27 DIAGNOSIS — Z90710 Acquired absence of both cervix and uterus: Secondary | ICD-10-CM

## 2019-12-27 DIAGNOSIS — Z66 Do not resuscitate: Secondary | ICD-10-CM | POA: Diagnosis not present

## 2019-12-27 DIAGNOSIS — J9601 Acute respiratory failure with hypoxia: Secondary | ICD-10-CM | POA: Diagnosis present

## 2019-12-27 DIAGNOSIS — Z955 Presence of coronary angioplasty implant and graft: Secondary | ICD-10-CM | POA: Diagnosis not present

## 2019-12-27 DIAGNOSIS — R7989 Other specified abnormal findings of blood chemistry: Secondary | ICD-10-CM | POA: Diagnosis not present

## 2019-12-27 DIAGNOSIS — I10 Essential (primary) hypertension: Secondary | ICD-10-CM | POA: Diagnosis present

## 2019-12-27 DIAGNOSIS — Z6836 Body mass index (BMI) 36.0-36.9, adult: Secondary | ICD-10-CM | POA: Diagnosis not present

## 2019-12-27 DIAGNOSIS — Z79899 Other long term (current) drug therapy: Secondary | ICD-10-CM

## 2019-12-27 DIAGNOSIS — U071 COVID-19: Secondary | ICD-10-CM | POA: Diagnosis not present

## 2019-12-27 DIAGNOSIS — D696 Thrombocytopenia, unspecified: Secondary | ICD-10-CM | POA: Diagnosis not present

## 2019-12-27 DIAGNOSIS — E669 Obesity, unspecified: Secondary | ICD-10-CM | POA: Diagnosis present

## 2019-12-27 DIAGNOSIS — E119 Type 2 diabetes mellitus without complications: Secondary | ICD-10-CM | POA: Diagnosis not present

## 2019-12-27 DIAGNOSIS — Z7189 Other specified counseling: Secondary | ICD-10-CM

## 2019-12-27 DIAGNOSIS — Z515 Encounter for palliative care: Secondary | ICD-10-CM | POA: Diagnosis not present

## 2019-12-27 DIAGNOSIS — R6889 Other general symptoms and signs: Secondary | ICD-10-CM

## 2019-12-27 DIAGNOSIS — I252 Old myocardial infarction: Secondary | ICD-10-CM | POA: Diagnosis not present

## 2019-12-27 DIAGNOSIS — E785 Hyperlipidemia, unspecified: Secondary | ICD-10-CM | POA: Diagnosis present

## 2019-12-27 DIAGNOSIS — R0902 Hypoxemia: Secondary | ICD-10-CM

## 2019-12-27 DIAGNOSIS — J1282 Pneumonia due to coronavirus disease 2019: Secondary | ICD-10-CM | POA: Diagnosis present

## 2019-12-27 DIAGNOSIS — R0602 Shortness of breath: Secondary | ICD-10-CM | POA: Diagnosis not present

## 2019-12-27 DIAGNOSIS — Z9981 Dependence on supplemental oxygen: Secondary | ICD-10-CM

## 2019-12-27 DIAGNOSIS — K219 Gastro-esophageal reflux disease without esophagitis: Secondary | ICD-10-CM | POA: Diagnosis not present

## 2019-12-27 LAB — COMPREHENSIVE METABOLIC PANEL
ALT: 35 U/L (ref 0–44)
AST: 48 U/L — ABNORMAL HIGH (ref 15–41)
Albumin: 3.8 g/dL (ref 3.5–5.0)
Alkaline Phosphatase: 57 U/L (ref 38–126)
Anion gap: 15 (ref 5–15)
BUN: 31 mg/dL — ABNORMAL HIGH (ref 8–23)
CO2: 26 mmol/L (ref 22–32)
Calcium: 9.6 mg/dL (ref 8.9–10.3)
Chloride: 96 mmol/L — ABNORMAL LOW (ref 98–111)
Creatinine, Ser: 0.92 mg/dL (ref 0.44–1.00)
GFR calc Af Amer: >60 mL/min (ref >60)
GFR calc non Af Amer: >60 mL/min (ref >60)
Glucose, Bld: 169 mg/dL — ABNORMAL HIGH (ref 70–99)
Potassium: 4 mmol/L (ref 3.5–5.1)
Sodium: 137 mmol/L (ref 135–145)
Total Bilirubin: 0.7 mg/dL (ref 0.3–1.2)
Total Protein: 7 g/dL (ref 6.5–8.1)

## 2019-12-27 LAB — LACTIC ACID, PLASMA
Lactic Acid, Venous: 2 mmol/L (ref 0.5–1.9)
Lactic Acid, Venous: 3.7 mmol/L (ref 0.5–1.9)

## 2019-12-27 LAB — CBC WITH DIFFERENTIAL/PLATELET
Abs Immature Granulocytes: 0.01 K/uL (ref 0.00–0.07)
Basophils Absolute: 0 K/uL (ref 0.0–0.1)
Basophils Relative: 0 %
Eosinophils Absolute: 0 K/uL (ref 0.0–0.5)
Eosinophils Relative: 0 %
HCT: 52.6 % — ABNORMAL HIGH (ref 36.0–46.0)
Hemoglobin: 17.5 g/dL — ABNORMAL HIGH (ref 12.0–15.0)
Immature Granulocytes: 0 %
Lymphocytes Relative: 22 %
Lymphs Abs: 1.3 K/uL (ref 0.7–4.0)
MCH: 29.2 pg (ref 26.0–34.0)
MCHC: 33.3 g/dL (ref 30.0–36.0)
MCV: 87.8 fL (ref 80.0–100.0)
Monocytes Absolute: 0.6 K/uL (ref 0.1–1.0)
Monocytes Relative: 10 %
Neutro Abs: 4.1 K/uL (ref 1.7–7.7)
Neutrophils Relative %: 68 %
Platelets: 154 K/uL (ref 150–400)
RBC: 5.99 MIL/uL — ABNORMAL HIGH (ref 3.87–5.11)
RDW: 12.6 % (ref 11.5–15.5)
WBC: 6 K/uL (ref 4.0–10.5)
nRBC: 0 % (ref 0.0–0.2)

## 2019-12-27 LAB — POC SARS CORONAVIRUS 2 AG -  ED: SARS Coronavirus 2 Ag: POSITIVE — AB

## 2019-12-27 LAB — D-DIMER, QUANTITATIVE: D-Dimer, Quant: 0.92 ug{FEU}/mL — ABNORMAL HIGH (ref 0.00–0.50)

## 2019-12-27 LAB — C-REACTIVE PROTEIN: CRP: 1.2 mg/dL — ABNORMAL HIGH

## 2019-12-27 LAB — LACTATE DEHYDROGENASE: LDH: 308 U/L — ABNORMAL HIGH (ref 98–192)

## 2019-12-27 LAB — FERRITIN: Ferritin: 637 ng/mL — ABNORMAL HIGH (ref 11–307)

## 2019-12-27 LAB — FIBRINOGEN: Fibrinogen: 445 mg/dL (ref 210–475)

## 2019-12-27 LAB — TRIGLYCERIDES: Triglycerides: 94 mg/dL

## 2019-12-27 LAB — PROCALCITONIN: Procalcitonin: <0.1 ng/mL

## 2019-12-27 LAB — ABO/RH: ABO/RH(D): O POS

## 2019-12-27 MED ORDER — ONDANSETRON HCL 4 MG/2ML IJ SOLN: 4.0000 mg | Freq: Four times a day (QID) | INTRAMUSCULAR | Status: DC | PRN

## 2019-12-27 MED ORDER — SODIUM CHLORIDE 0.9 % IV SOLN
100.0000 mg | Freq: Every day | INTRAVENOUS | Status: AC
  Administered 2019-12-28 – 2019-12-31 (×4): 100 mg via INTRAVENOUS
  Filled 2019-12-27 (×3): qty 100
  Filled 2019-12-27: qty 20

## 2019-12-27 MED ORDER — PRAVASTATIN SODIUM 40 MG PO TABS
40.0000 mg | ORAL_TABLET | Freq: Every day | ORAL | Status: DC
  Administered 2019-12-28 – 2020-01-03 (×7): 40 mg via ORAL
  Filled 2019-12-27 (×7): qty 1
  Filled 2019-12-27: qty 4
  Filled 2019-12-27 (×2): qty 1

## 2019-12-27 MED ORDER — PANTOPRAZOLE SODIUM 40 MG PO TBEC
40.0000 mg | DELAYED_RELEASE_TABLET | Freq: Every day | ORAL | Status: DC
  Administered 2019-12-27 – 2020-01-03 (×8): 40 mg via ORAL
  Filled 2019-12-27 (×8): qty 1

## 2019-12-27 MED ORDER — ALBUTEROL SULFATE HFA 108 (90 BASE) MCG/ACT IN AERS
1.0000 | INHALATION_SPRAY | RESPIRATORY_TRACT | Status: DC | PRN
  Administered 2019-12-27: 1 via RESPIRATORY_TRACT
  Filled 2019-12-27: qty 6.7

## 2019-12-27 MED ORDER — SODIUM CHLORIDE 0.9% FLUSH
3.0000 mL | Freq: Two times a day (BID) | INTRAVENOUS | Status: DC
  Administered 2019-12-27 – 2020-01-03 (×15): 3 mL via INTRAVENOUS

## 2019-12-27 MED ORDER — ALPRAZOLAM 0.25 MG PO TABS
0.2500 mg | ORAL_TABLET | Freq: Two times a day (BID) | ORAL | Status: DC
  Administered 2019-12-27 – 2020-01-03 (×14): 0.25 mg via ORAL
  Filled 2019-12-27 (×14): qty 1

## 2019-12-27 MED ORDER — COQ10 100 MG PO CAPS: 100.0000 mg | ORAL_CAPSULE | Freq: Every morning | ORAL | Status: DC

## 2019-12-27 MED ORDER — SODIUM CHLORIDE 0.9 % IV SOLN
200.0000 mg | Freq: Once | INTRAVENOUS | Status: AC
  Administered 2019-12-27: 200 mg via INTRAVENOUS
  Filled 2019-12-27: qty 40

## 2019-12-27 MED ORDER — ONDANSETRON HCL 4 MG PO TABS
4.0000 mg | ORAL_TABLET | Freq: Four times a day (QID) | ORAL | Status: DC | PRN
  Administered 2019-12-27: 4 mg via ORAL
  Filled 2019-12-27: qty 1

## 2019-12-27 MED ORDER — ISOSORBIDE MONONITRATE ER 60 MG PO TB24
60.0000 mg | ORAL_TABLET | Freq: Every day | ORAL | Status: DC
  Administered 2019-12-28 – 2020-01-03 (×7): 60 mg via ORAL
  Filled 2019-12-27 (×10): qty 1

## 2019-12-27 MED ORDER — LABETALOL HCL 200 MG PO TABS
100.0000 mg | ORAL_TABLET | Freq: Two times a day (BID) | ORAL | Status: DC
  Administered 2019-12-27: 22:00:00 100 mg via ORAL
  Filled 2019-12-27 (×2): qty 1

## 2019-12-27 MED ORDER — NITROGLYCERIN 0.4 MG SL SUBL: 0.4000 mg | SUBLINGUAL_TABLET | SUBLINGUAL | Status: DC | PRN

## 2019-12-27 MED ORDER — ACETAMINOPHEN 325 MG PO TABS: 650.0000 mg | ORAL_TABLET | Freq: Four times a day (QID) | ORAL | Status: DC | PRN

## 2019-12-27 MED ORDER — UMECLIDINIUM BROMIDE 62.5 MCG/INH IN AEPB
1.0000 | INHALATION_SPRAY | Freq: Every day | RESPIRATORY_TRACT | Status: DC
  Administered 2019-12-28 – 2020-01-03 (×7): 1 via RESPIRATORY_TRACT
  Filled 2019-12-27: qty 7

## 2019-12-27 MED ORDER — ALBUTEROL SULFATE HFA 108 (90 BASE) MCG/ACT IN AERS
6.0000 | INHALATION_SPRAY | Freq: Once | RESPIRATORY_TRACT | Status: AC
  Administered 2019-12-27: 6 via RESPIRATORY_TRACT
  Filled 2019-12-27: qty 6.7

## 2019-12-27 MED ORDER — ENOXAPARIN SODIUM 40 MG/0.4ML ~~LOC~~ SOLN
40.0000 mg | SUBCUTANEOUS | Status: DC
  Administered 2019-12-28 – 2020-01-03 (×7): 40 mg via SUBCUTANEOUS
  Filled 2019-12-27 (×7): qty 0.4

## 2019-12-27 MED ORDER — ESCITALOPRAM OXALATE 10 MG PO TABS
20.0000 mg | ORAL_TABLET | Freq: Every morning | ORAL | Status: DC
  Administered 2019-12-27 – 2020-01-03 (×8): 20 mg via ORAL
  Filled 2019-12-27 (×8): qty 2

## 2019-12-27 MED ORDER — SODIUM CHLORIDE 0.9% FLUSH: 3.0000 mL | INTRAVENOUS | Status: DC | PRN

## 2019-12-27 MED ORDER — DEXAMETHASONE SODIUM PHOSPHATE 10 MG/ML IJ SOLN
6.0000 mg | INTRAMUSCULAR | Status: DC
  Administered 2019-12-27 – 2020-01-03 (×8): 6 mg via INTRAVENOUS
  Filled 2019-12-27 (×8): qty 1

## 2019-12-27 MED ORDER — SODIUM CHLORIDE 0.9 % IV SOLN: 250.0000 mL | INTRAVENOUS | Status: DC | PRN

## 2019-12-27 MED ORDER — FUROSEMIDE 20 MG PO TABS
20.0000 mg | ORAL_TABLET | ORAL | Status: DC
  Administered 2019-12-27 – 2020-01-02 (×5): 20 mg via ORAL
  Filled 2019-12-27 (×6): qty 1

## 2019-12-27 MED ORDER — MIRTAZAPINE 15 MG PO TABS
7.5000 mg | ORAL_TABLET | Freq: Every day | ORAL | Status: DC
  Administered 2019-12-27 – 2020-01-02 (×7): 7.5 mg via ORAL
  Filled 2019-12-27 (×7): qty 1

## 2019-12-27 MED ORDER — POTASSIUM CHLORIDE CRYS ER 20 MEQ PO TBCR
20.0000 meq | EXTENDED_RELEASE_TABLET | ORAL | Status: DC
  Administered 2019-12-27 – 2019-12-29 (×2): 20 meq via ORAL
  Filled 2019-12-27 (×2): qty 1

## 2019-12-27 MED ORDER — BUSPIRONE HCL 5 MG PO TABS
15.0000 mg | ORAL_TABLET | Freq: Two times a day (BID) | ORAL | Status: DC
  Administered 2019-12-27 – 2020-01-03 (×15): 15 mg via ORAL
  Filled 2019-12-27 (×15): qty 3

## 2019-12-27 MED ORDER — GUAIFENESIN-DM 100-10 MG/5ML PO SYRP: 10.0000 mL | ORAL_SOLUTION | ORAL | Status: DC | PRN

## 2019-12-27 MED ORDER — UMECLIDINIUM BROMIDE 62.5 MCG/INH IN AEPB
INHALATION_SPRAY | RESPIRATORY_TRACT | Status: AC
  Administered 2019-12-27: 1
  Filled 2019-12-27: qty 7

## 2019-12-27 MED ORDER — HYDROCOD POLST-CPM POLST ER 10-8 MG/5ML PO SUER
5.0000 mL | Freq: Two times a day (BID) | ORAL | Status: DC | PRN
  Administered 2019-12-30: 5 mL via ORAL
  Filled 2019-12-27: qty 5

## 2019-12-27 NOTE — ED Provider Notes (Addendum)
[facility] EMERGENCY DEPARTMENT Provider Note   CSN: [id number] Arrival date & time: 12/27/19  1118     History Chief Complaint  Patient presents with  . Shortness of Breath    Vickie [name] is a 74 y.o. female.  HPI      Vickie [name] is a 74 y.o. female, with a history of anxiety, COPD, CAD, HTN, GERD, LBBB, DM, presenting to the ED with shortness of breath and cough for the past 2 days.  Accompanied by fatigue, generalized weakness, generalized body aches, and subjective fever. She states she was exposed to someone with COVID-19 around the time of Christmas. She has not been using her albuterol inhaler.  Denies history of PE/DVT. Denies chest pain, syncope, abdominal pain, N/V/D, or any other complaints.  Past Medical History:  Diagnosis Date  . Anginal pain ([place])   . Anxiety   . Anxiety disorder    With apparent panic attacks  . Arthritis    "hands, knees, back" (09/18/2017)  . CAD S/P percutaneous coronary angioplasty 08/2017   Single-vessel CAD involving second diagonal branch treated with DES stent Synergy 2.25 mm x 12 mm (2.4 mm)  . Chronic back pain    "all over" (09/18/2017)  . COPD (chronic obstructive pulmonary disease) ([place])    PFTs were done in 2008 at [facility]  . Depression   . Dyspnea   . Dysrhythmia    LBBB  . Essential hypertension   . GERD (gastroesophageal reflux disease)   . Hiatal hernia   . History of left bundle branch block (LBBB) 08/2017   Rate Related LBBB noted in cath lab  . Hyperlipidemia   . Macular degeneration    right eye  . Myocardial infarction [facility]) 2007   "medically induced"  . Nonocclusive coronary atherosclerosis of native coronary artery 2006 through 2012   multi caths 2012, 2006, [id number] ([id number] complicated by catheter-induced dissection of small nondominant RCA, patent in 2009 with no residual abnormality); Myoview August 2013: LOW RISK, normal EF. ; Echocardiogram [name] - January 2014) moderate LVH, EF  55-65%. No significant valvular disease.  . OSA (obstructive sleep apnea) 9/25/ 2012   tested 2009; tetested sleep study 07/2011--titration 09/20/2011 now use Bi-PAP  . OSA on CPAP   . Pneumonia    "several times in 2017; 3 times already this year" (09/18/2017)  . Scoliosis   . Type II diabetes mellitus [facility])     Patient Active Problem List   Diagnosis Date Noted  . Acute hypoxemic respiratory failure due to COVID-19 ([place]) 12/27/2019  . Hypotension 10/02/2017  . Status post coronary artery stent placement   . CAD S/P percutaneous coronary angioplasty 09/18/2017  . Pre-op evaluation 09/05/2017  . Special screening for malignant neoplasms, colon 07/31/2017  . Elevated CK 07/30/2017  . DOE (dyspnea on exertion) 07/28/2017  . Bilateral lower extremity edema 09/10/2016  . Claudication ([place]) 06/18/2015  . Obesity (BMI 30-39.9) 04/19/2014  . Coronary artery disease involving native coronary artery of native heart with angina pectoris ([place])   . Hyperlipidemia LDL goal <70   . Dysphagia, unspecified(787.20) 02/17/2014  . Hypothyroidism 04/19/2008  . DIABETES MELLITUS 04/19/2008  . ANXIETY DEPRESSION 04/19/2008  . Essential hypertension 04/19/2008  . GASTROESOPHAGEAL REFLUX DISEASE 04/19/2008  . ISCHEMIC COLITIS 04/19/2008  . ARTHRITIS 04/19/2008    Past Surgical History:  Procedure Laterality Date  . CARDIAC CATHETERIZATION  [id number]; 2009   Non-occlusice CAD - only 80% ostial SP1;  NON DOMINANNT  RCA (  catheter insuced dissection with MI in 2007 --> resolved by 2009 cath)  . CATARACT EXTRACTION, BILATERAL Bilateral 2017   Toric lens "in the left eye only"  . COLONOSCOPY WITH PROPOFOL N/A 11/09/2018   Procedure: COLONOSCOPY WITH PROPOFOL;  Surgeon: Rogene Houston, MD;  Location: [facility] ENDO [facility]ITE;  Service: Endoscopy;  Laterality: N/A;  2:25  . CORONARY STENT INTERVENTION N/A 09/18/2017   Procedure: CORONARY STENT INTERVENTION;  Surgeon: Leonie Man, MD;  Location: [facility] INVASIVE CV  LAB: DES PCI Diag2: Synergy DES 2.25 mm x 12 mm (2.4 mm)  . DIAGNOSTIC LAPAROSCOPY Right   . DILATION AND CURETTAGE OF UTERUS    . EYE SURGERY    . INTRAVASCULAR PRESSURE WIRE/FFR STUDY N/A 09/18/2017   Procedure: INTRAVASCULAR PRESSURE WIRE/FFR STUDY;  Surgeon: Leonie Man, MD;  Location: [place] CV LAB;  Service: Cardiovascular: FFR Diag 2 -- 0.78 (significcant) --> PCI  . KNEE ARTHROSCOPY Right   . LEFT HEART CATH AND CORONARY ANGIOGRAPHY N/A 09/18/2017   Procedure: LEFT HEART CATH AND CORONARY ANGIOGRAPHY;  Surgeon: Leonie Man, MD;  Location: [facility] INVASIVE CV LAB: [place] - 80% Diag2 (FFR 0.78)- PCI.  pLAD 40%, pCx 40%. Post PCI LBBB. LVEDP - ~20 mmHg. EF 55-60%  . LIPOMA EXCISION Right ~ 2015   anterior abdomen  . PARS PLANA VITRECTOMY W/ REPAIR OF MACULAR HOLE Right    Unsuccessful repair.  Hole filled  . POLYPECTOMY  11/09/2018   Procedure: POLYPECTOMY;  Surgeon: Rogene Houston, MD;  Location: [facility];  Service: Endoscopy;;  colon  . VAGINAL HYSTERECTOMY       OB History   No obstetric history on file.     Family History  Problem Relation Age of Onset  . CAD Mother   . Breast cancer Maternal Aunt   . Breast cancer Paternal Aunt     Social History   Tobacco Use  . Smoking status: Former Smoker    Packs/day: 3.00    Years: 8.50    Pack years: 25.50    Types: Cigarettes    Quit date: 04/18/1976    Years since quitting: 43.7  . Smokeless tobacco: Never Used  Substance Use Topics  . Alcohol use: No  . Drug use: No    Home Medications Prior to Admission medications   Medication Sig Start Date End Date Taking? Authorizing Provider  albuterol (PROAIR HFA) 108 (90 BASE) MCG/ACT inhaler Inhale 1 puff into the lungs every 6 (six) hours as needed for wheezing or shortness of breath.    [provider]  ALPRAZolam Duanne Moron) 0.5 MG tablet Take 0.25 mg by mouth 2 (two) times daily.     [provider]  AMBULATORY NON FORMULARY MEDICATION  Inject 300 mg into the skin as directed. Medication Name:Inclisiran sodium 300 mg vs placebo 02/22/17   Hillary Bow, MD  Biotin w/ Vitamins C & E (HAIR/SKIN/NAILS PO) Take 1 tablet by mouth at bedtime.    [provider]  busPIRone (BUSPAR) 15 MG tablet Take 15 mg by mouth 2 (two) times daily.    [provider]  Calcium Carb-Cholecalciferol (CALCIUM 600 + D PO) Take 1 tablet by mouth daily.     [provider]  Coenzyme Q10 (COQ10) 100 MG CAPS Take 100 mg by mouth every morning.    [provider]  CRANBERRY PO Take 1 capsule by mouth daily.     [provider]  escitalopram (LEXAPRO) 20 MG tablet Take 20  mg by mouth every morning.     [provider]  furosemide (LASIX) 20 MG tablet TAKE ONE TABLET EVERY OTHER DAY. 12/23/19   Leonie Man, MD  isosorbide mononitrate (IMDUR) 60 MG 24 hr tablet TAKE ONE TABLET BY MOUTH ONCE DAILY. 11/26/19   Leonie Man, MD  labetalol (NORMODYNE) 100 MG tablet TAKE 1 TABLET BY MOUTH TWICE DAILY. 05/02/19   Almyra Deforest, PA  mirtazapine (REMERON) 15 MG tablet Take 7.5 mg by mouth at bedtime.     [provider]  Multiple Vitamin (MULTIVITAMIN) tablet Take 1 tablet by mouth at bedtime.     [provider]  nitroGLYCERIN (NITROSTAT) 0.4 MG SL tablet PLACE 1 TAB UNDER TONGUE EVERY 5 MIN IF NEEDED FOR CHEST PAIN. MAY USE 3 TIMES.NO RELIEF CALL 911. 01/03/19   Leonie Man, MD  Omega-3 Fatty Acids (FISH OIL PO) Take 1 capsule by mouth at bedtime.    [provider]  OVER THE COUNTER MEDICATION Take 1 tablet by mouth at bedtime. Restful Legs Supplement    [provider]  pantoprazole (PROTONIX) 40 MG tablet TAKE ONE TABLET BY MOUTH ONCE DAILY. 05/23/19   Leonie Man, MD  potassium chloride SA (KLOR-CON) 20 MEQ tablet TAKE 1 TABLET EVERY OTHER DAY, TAKE ON DAYS THAT YOU TAKE FUROSEMIDE. 12/23/19   Leonie Man, MD  pravastatin (PRAVACHOL) 40 MG tablet Take 1 tablet  (40 mg total) by mouth daily. 12/16/19   Leonie Man, MD  Tiotropium Bromide Monohydrate (SPIRIVA RESPIMAT) 2.5 MCG/ACT AERS Inhale 1 puff into the lungs 2 (two) times daily.     [provider]    Allergies    Adhesive [tape], Avelox [moxifloxacin], Bactrim [sulfamethoxazole-trimethoprim], Levaquin [levofloxacin], Mucinex [guaifenesin er], Penicillins, Sudafed [pseudoephedrine hcl], Crestor [rosuvastatin], Lipitor [atorvastatin], Vytorin [ezetimibe-simvastatin], and Norvasc [amlodipine]  Review of Systems   Review of Systems  Constitutional: Positive for fatigue and fever.  Respiratory: Positive for cough and shortness of breath.   Cardiovascular: Negative for chest pain and leg swelling.  Gastrointestinal: Negative for abdominal pain, diarrhea, nausea and vomiting.  Musculoskeletal: Positive for myalgias.  Neurological: Positive for weakness. Negative for dizziness and syncope.  All other systems reviewed and are negative.   Physical Exam Updated Vital Signs BP (!) 137/57 (BP Location: Left Arm)   Pulse 86   Temp 98.1 F (36.7 C) (Oral)   Resp 20   SpO2 91%   Physical Exam Vitals and nursing note reviewed.  Constitutional:      General: She is in acute distress.     Appearance: She is well-developed. She is ill-appearing. She is not diaphoretic.  HENT:     Head: Normocephalic and atraumatic.     Mouth/Throat:     Mouth: Mucous membranes are moist.     Pharynx: Oropharynx is clear.  Eyes:     Conjunctiva/sclera: Conjunctivae normal.  Cardiovascular:     Rate and Rhythm: Normal rate and regular rhythm.     Pulses: Normal pulses.          Radial pulses are 2+ on the right side and 2+ on the left side.       Posterior tibial pulses are 2+ on the right side and 2+ on the left side.     Heart sounds: Normal heart sounds.     Comments: Tactile temperature in the extremities appropriate and equal bilaterally. Pulmonary:     Effort: Tachypnea and respiratory  distress (with talking or moving) present.  Breath sounds: Normal breath sounds.     Comments: Conversational dyspnea with increased work of breathing.  SPO2 dropped to around 86% on room air.  98 to 100% on 2 L supplemental O2. Abdominal:     Palpations: Abdomen is soft.     Tenderness: There is no abdominal tenderness. There is no guarding.  Musculoskeletal:     Cervical back: Neck supple.     Right lower leg: No edema.     Left lower leg: No edema.  Lymphadenopathy:     Cervical: No cervical adenopathy.  Skin:    General: Skin is warm and dry.  Neurological:     Mental Status: She is alert.  Psychiatric:        Mood and Affect: Mood and affect normal.        Speech: Speech normal.        Behavior: Behavior normal.     ED Results / Procedures / Treatments   Labs (all labs ordered are listed, but only abnormal results are displayed) Labs Reviewed  LACTIC ACID, PLASMA - Abnormal; Notable for the following components:      Result Value   Lactic Acid, Venous 2.0 (*)    All other components within normal limits  CBC WITH DIFFERENTIAL/PLATELET - Abnormal; Notable for the following components:   RBC 5.99 (*)    Hemoglobin 17.5 (*)    HCT 52.6 (*)    All other components within normal limits  COMPREHENSIVE METABOLIC PANEL - Abnormal; Notable for the following components:   Chloride 96 (*)    Glucose, Bld 169 (*)    BUN 31 (*)    AST 48 (*)    All other components within normal limits  D-DIMER, QUANTITATIVE (NOT AT [facility]) - Abnormal; Notable for the following components:   D-Dimer, Quant 0.92 (*)    All other components within normal limits  LACTATE DEHYDROGENASE - Abnormal; Notable for the following components:   LDH 308 (*)    All other components within normal limits  FERRITIN - Abnormal; Notable for the following components:   Ferritin 637 (*)    All other components within normal limits  C-REACTIVE PROTEIN - Abnormal; Notable for the following components:   CRP 1.2  (*)    All other components within normal limits  POC SARS CORONAVIRUS 2 AG -  ED - Abnormal; Notable for the following components:   SARS Coronavirus 2 Ag POSITIVE (*)    All other components within normal limits  CULTURE, BLOOD (ROUTINE X 2)  CULTURE, BLOOD (ROUTINE X 2)  PROCALCITONIN  TRIGLYCERIDES  FIBRINOGEN  LACTIC ACID, PLASMA    EKG EKG Interpretation  Date/Time:  Friday December 27 2019 13:56:29 EST Ventricular Rate:  75 PR Interval:    QRS Duration: 91 QT Interval:  434 QTC Calculation: 485 R Axis:   -6 Text Interpretation: Sinus rhythm Abnormal R-wave progression, early transition Left ventricular hypertrophy Borderline T abnormalities, diffuse leads since last tracing no significant change Confirmed by Noemi Chapel [phone]) on 12/27/2019 2:00:07 PM   Radiology DG Chest Port 1 View  Result Date: 12/27/2019 CLINICAL DATA:  COVID exposure. Shortness of breath, fever and headache. EXAM: PORTABLE CHEST 1 VIEW COMPARISON:  12/27/2016 FINDINGS: Heart size normal. No pleural effusion or edema. Bilateral lower lung zone interstitial opacities are identified, new from previous exam. The visualized osseous structures appear intact. IMPRESSION: 1. Bilateral lower lung zone interstitial opacities. Imaging findings compatible with an inflammatory or infectious process. Atypical viral pneumonia not  excluded. Electronically Signed   By: Kerby Moors M.D.   On: 12/27/2019 12:26    Procedures Procedures (including critical care time)  Medications Ordered in ED Medications  albuterol (VENTOLIN HFA) 108 (90 Base) MCG/ACT inhaler 6 puff (6 puffs Inhalation Given 12/27/19 1356)    ED Course  I have reviewed the triage vital signs and the nursing notes.  Pertinent labs & imaging results that were available during my care of the patient were reviewed by me and considered in my medical decision making (see chart for details).  Clinical Course as of Dec 26 1502  Fri Dec 27, 2019  1406  Spoke with Dr. Manuella Ghazi, Hospitalist.  Agrees to admit the patient.   [SJ]    Clinical Course User Index [SJ] Jontrell Bushong, Helane Gunther, PA-C   MDM Rules/Calculators/A&P                      Patient presents with cough and shortness of breath.  Nontoxic-appearing and afebrile at presentation, however, patient is ill-appearing with increased work of breathing; hypoxia with SPO2 down to 86% on room air, requiring supplemental O2.  Bilateral lung opacities on chest x-ray. No patient has mildly elevated lactic acid level, my suspicion for sepsis is low.  Rather, I suspect patient symptoms are due to COVID-19 infection. Covid positive. Patient admitted for further management.  Findings and plan of care discussed with Noemi Chapel, MD. Dr. Sabra Heck personally evaluated and examined this patient.  NETRA VESSELS was evaluated in Emergency Department on 12/27/2019 for the symptoms described in the history of present illness. She was evaluated in the context of the global COVID-19 pandemic, which necessitated consideration that the patient might be at risk for infection with the SARS-CoV-2 virus that causes COVID-19. Institutional protocols and algorithms that pertain to the evaluation of patients at risk for COVID-19 are in a state of rapid change based on information released by regulatory bodies including the CDC and federal and state organizations. These policies and algorithms were followed during the patient's care in the ED.    Vitals:   12/27/19 1320 12/27/19 1325 12/27/19 1330 12/27/19 1345  BP:   (!) 122/46   Pulse: 83 79  75  Resp:      Temp:      TempSrc:      SpO2: 95% 94%  98%     Final Clinical Impression(s) / ED Diagnoses Final diagnoses:  COVID-19  Shortness of breath  Hypoxia    Rx / DC Orders ED Discharge Orders    None       Layla Maw 12/27/19 1503    Lorayne Bender, PA-C 12/27/19 1504    Noemi Chapel, MD 01/06/20 1752

## 2019-12-27 NOTE — ED Triage Notes (Signed)
Short of breath, states she has been exposed to covid

## 2019-12-27 NOTE — ED Notes (Signed)
Date and time results received: 12/27/19 1316 (use smartphrase ".now" to insert current time)  Test: Lactic Acid Critical Value:2.0  Name of Provider Notified: Arlean Hopping PA Orders Received? Or Actions Taken?: NA

## 2019-12-27 NOTE — H&P (Signed)
History and Physical    Vickie [name] [id number] DOB: [birth year] DOA: 12/27/2019  PCP: Sharilyn Sites, MD   Patient coming from: Home  Chief Complaint: Shortness of breath  HPI: Vickie [name] is a 74 y.o. female with medical history significant for CAD with prior PCI in 2018, COPD, obesity, hypertension, dyslipidemia, osteoarthritis, and GERD who presented with some nonproductive cough and shortness of breath over the last 2 days.  She had been exposed to multiple family members who have now tested positive for COVID-19 over the Christmas holiday.  She has some conversational dyspnea with no worsening edema or orthopnea.  She denies any chest pain, fevers, chills, nausea, vomiting, or diarrhea.  She denies any wheezing or chest tightness.   ED Course: Stable vital signs noted and patient is afebrile.  Lactic acid level was 2 and blood glucose 169.  No leukocytosis noted and procalcitonin less than 0.10.  Her CRP is 1.2 and ferritin is 637.  EKG with sinus rhythm at 75 bpm.  Her coronavirus antigen testing has returned positive.  1 view chest x-ray with bilateral lower lobe interstitial opacities noted.  She has been given some albuterol inhaler breathing treatment with some improvement noted.  She is requiring 3 L nasal cannula oxygen and was initially noted to have an oxygen saturation of approximately 86%.  Review of Systems: All others reviewed and otherwise negative except as noted above.  Past Medical History:  Diagnosis Date  . Anginal pain ([place])   . Anxiety   . Anxiety disorder    With apparent panic attacks  . Arthritis    "hands, knees, back" (09/18/2017)  . CAD S/P percutaneous coronary angioplasty 08/2017   Single-vessel CAD involving second diagonal branch treated with DES stent Synergy 2.25 mm x 12 mm (2.4 mm)  . Chronic back pain    "all over" (09/18/2017)  . COPD (chronic obstructive pulmonary disease) ([place])    PFTs were done in 2008 at [facility]  . Depression   .  Dyspnea   . Dysrhythmia    LBBB  . Essential hypertension   . GERD (gastroesophageal reflux disease)   . Hiatal hernia   . History of left bundle branch block (LBBB) 08/2017   Rate Related LBBB noted in cath lab  . Hyperlipidemia   . Macular degeneration    right eye  . Myocardial infarction [facility]) 2007   "medically induced"  . Nonocclusive coronary atherosclerosis of native coronary artery 2006 through 2012   multi caths 2012, 2006, [id number] ([id number] complicated by catheter-induced dissection of small nondominant RCA, patent in 2009 with no residual abnormality); Myoview August 2013: LOW RISK, normal EF. ; Echocardiogram [name] - January 2014) moderate LVH, EF 55-65%. No significant valvular disease.  . OSA (obstructive sleep apnea) 9/25/ 2012   tested 2009; tetested sleep study 07/2011--titration 09/20/2011 now use Bi-PAP  . OSA on CPAP   . Pneumonia    "several times in 2017; 3 times already this year" (09/18/2017)  . Scoliosis   . Type II diabetes mellitus ([place])     Past Surgical History:  Procedure Laterality Date  . CARDIAC CATHETERIZATION  [id number]; 2009   Non-occlusice CAD - only 80% ostial SP1;  NON DOMINANNT  RCA (catheter insuced dissection with MI in 2007 --> resolved by 2009 cath)  . CATARACT EXTRACTION, BILATERAL Bilateral 2017   Toric lens "in the left eye only"  . COLONOSCOPY WITH PROPOFOL N/A 11/09/2018   Procedure: COLONOSCOPY WITH PROPOFOL;  Surgeon:  Rogene Houston, MD;  [facility];  Service: Endoscopy;  Laterality: N/A;  2:25  . CORONARY STENT INTERVENTION N/A 09/18/2017   Procedure: CORONARY STENT INTERVENTION;  Surgeon: Leonie Man, MD;  Location: [facility] INVASIVE CV LAB: DES PCI Diag2: Synergy DES 2.25 mm x 12 mm (2.4 mm)  . DIAGNOSTIC LAPAROSCOPY Right   . DILATION AND CURETTAGE OF UTERUS    . EYE SURGERY    . INTRAVASCULAR PRESSURE WIRE/FFR STUDY N/A 09/18/2017   Procedure: INTRAVASCULAR PRESSURE WIRE/FFR STUDY;  Surgeon: Leonie Man,  MD;  Location: [place] CV LAB;  Service: Cardiovascular: FFR Diag 2 -- 0.78 (significcant) --> PCI  . KNEE ARTHROSCOPY Right   . LEFT HEART CATH AND CORONARY ANGIOGRAPHY N/A 09/18/2017   Procedure: LEFT HEART CATH AND CORONARY ANGIOGRAPHY;  Surgeon: Leonie Man, MD;  Location: [facility] INVASIVE CV LAB: Culpril - 80% Diag2 (FFR 0.78)- PCI.  pLAD 40%, pCx 40%. Post PCI LBBB. LVEDP - ~20 mmHg. EF 55-60%  . LIPOMA EXCISION Right ~ 2015   anterior abdomen  . PARS PLANA VITRECTOMY W/ REPAIR OF MACULAR HOLE Right    Unsuccessful repair.  Hole filled  . POLYPECTOMY  11/09/2018   Procedure: POLYPECTOMY;  Surgeon: Rogene Houston, MD;  [facility];  Service: Endoscopy;;  colon  . VAGINAL HYSTERECTOMY       reports that she quit smoking about 43 years ago. Her smoking use included cigarettes. She has a 25.50 pack-year smoking history. She has never used smokeless tobacco. She reports that she does not drink alcohol or use drugs.  Allergies  Allergen Reactions  . Adhesive [Tape] Other (See Comments)    REACTION: redness/irritation at application site. **Certain bandages/adhesives cause this reaction**  . Avelox [Moxifloxacin] Anaphylaxis  . Bactrim [Sulfamethoxazole-Trimethoprim] Anaphylaxis, Shortness Of Breath, Rash and Other (See Comments)    REACTION: Choking, inability to swallow, redness  . Levaquin [Levofloxacin] Anaphylaxis, Shortness Of Breath, Rash and Other (See Comments)    Reaction:Choking Brand name Levaquin ok per pt  . Mucinex [Guaifenesin Er] Anaphylaxis, Shortness Of Breath and Rash  . Penicillins Other (See Comments)    "Passed out" Has patient had a PCN reaction causing immediate rash, facial/tongue/throat swelling, SOB or lightheadedness with hypotension: Unknown Has patient had a PCN reaction causing severe rash involving mucus membranes or skin necrosis: No Has patient had a PCN reaction that required hospitalization: No Has patient had a PCN reaction  occurring within the last 10 years: No If all of the above answers are "NO", then may proceed with Cephalosporin use.   [place] [Pseudoephedrine Hcl] Shortness Of Breath, Rash and Other (See Comments)    REACTION: Choking, redness, inability to swallow  . Crestor [Rosuvastatin] Other (See Comments)    Leg pain  . Lipitor [Atorvastatin] Other (See Comments)    Leg pain  . Vytorin [Ezetimibe-Simvastatin] Other (See Comments)    Leg pain  . Norvasc [Amlodipine] Other (See Comments)    unknown    Family History  Problem Relation Age of Onset  . CAD Mother   . Breast cancer Maternal Aunt   . Breast cancer Paternal Aunt     Prior to Admission medications   Medication Sig Start Date End Date Taking? Authorizing Provider  albuterol (PROAIR HFA) 108 (90 BASE) MCG/ACT inhaler Inhale 1 puff into the lungs every 6 (six) hours as needed for wheezing or shortness of breath.    [provider]  ALPRAZolam Duanne Moron) 0.5 MG tablet  Take 0.25 mg by mouth 2 (two) times daily.     [provider]  AMBULATORY NON FORMULARY MEDICATION Inject 300 mg into the skin as directed. Medication Name:Inclisiran sodium 300 mg vs placebo 02/22/17   Hillary Bow, MD  Biotin w/ Vitamins C & E (HAIR/SKIN/NAILS PO) Take 1 tablet by mouth at bedtime.    [provider]  busPIRone (BUSPAR) 15 MG tablet Take 15 mg by mouth 2 (two) times daily.    [provider]  Calcium Carb-Cholecalciferol (CALCIUM 600 + D PO) Take 1 tablet by mouth daily.     [provider]  Coenzyme Q10 (COQ10) 100 MG CAPS Take 100 mg by mouth every morning.    [provider]  CRANBERRY PO Take 1 capsule by mouth daily.     [provider]  escitalopram (LEXAPRO) 20 MG tablet Take 20 mg by mouth every morning.     [provider]  furosemide (LASIX) 20 MG tablet TAKE ONE TABLET EVERY OTHER DAY. 12/23/19   Leonie Man, MD  isosorbide mononitrate (IMDUR) 60 MG 24 hr tablet  TAKE ONE TABLET BY MOUTH ONCE DAILY. 11/26/19   Leonie Man, MD  labetalol (NORMODYNE) 100 MG tablet TAKE 1 TABLET BY MOUTH TWICE DAILY. 05/02/19   Almyra Deforest, PA  mirtazapine (REMERON) 15 MG tablet Take 7.5 mg by mouth at bedtime.     [provider]  Multiple Vitamin (MULTIVITAMIN) tablet Take 1 tablet by mouth at bedtime.     [provider]  nitroGLYCERIN (NITROSTAT) 0.4 MG SL tablet PLACE 1 TAB UNDER TONGUE EVERY 5 MIN IF NEEDED FOR CHEST PAIN. MAY USE 3 TIMES.NO RELIEF CALL 911. 01/03/19   Leonie Man, MD  Omega-3 Fatty Acids (FISH OIL PO) Take 1 capsule by mouth at bedtime.    [provider]  OVER THE COUNTER MEDICATION Take 1 tablet by mouth at bedtime. Restful Legs Supplement    [provider]  pantoprazole (PROTONIX) 40 MG tablet TAKE ONE TABLET BY MOUTH ONCE DAILY. 05/23/19   Leonie Man, MD  potassium chloride SA (KLOR-CON) 20 MEQ tablet TAKE 1 TABLET EVERY OTHER DAY, TAKE ON DAYS THAT YOU TAKE FUROSEMIDE. 12/23/19   Leonie Man, MD  pravastatin (PRAVACHOL) 40 MG tablet Take 1 tablet (40 mg total) by mouth daily. 12/16/19   Leonie Man, MD  Tiotropium Bromide Monohydrate (SPIRIVA RESPIMAT) 2.5 MCG/ACT AERS Inhale 1 puff into the lungs 2 (two) times daily.     [provider]    Physical Exam: Vitals:   12/27/19 1320 12/27/19 1325 12/27/19 1330 12/27/19 1345  BP:   (!) 122/46   Pulse: 83 79  75  Resp:      Temp:      TempSrc:      SpO2: 95% 94%  98%    Constitutional: NAD, calm, comfortable Vitals:   12/27/19 1320 12/27/19 1325 12/27/19 1330 12/27/19 1345  BP:   (!) 122/46   Pulse: 83 79  75  Resp:      Temp:      TempSrc:      SpO2: 95% 94%  98%   Eyes: lids and conjunctivae normal ENMT: Mucous membranes are moist.  Neck: normal, supple Respiratory: clear to auscultation bilaterally. Normal respiratory effort. No accessory muscle use.  3 L nasal cannula oxygen. Cardiovascular: Regular rate and rhythm,  no murmurs. No extremity edema. Abdomen: no tenderness, no distention. Bowel sounds positive.  Musculoskeletal:  No  joint deformity upper and lower extremities.   Skin: no rashes, lesions, ulcers.  Psychiatric: Normal judgment and insight. Alert and oriented x 3. Normal mood.   Labs on Admission: I have personally reviewed following labs and imaging studies  CBC: Recent Labs  Lab 12/27/19 1211  WBC 6.0  NEUTROABS 4.1  HGB 17.5*  HCT 52.6*  MCV 87.8  PLT 123456   Basic Metabolic Panel: Recent Labs  Lab 12/27/19 1211  NA 137  K 4.0  CL 96*  CO2 26  GLUCOSE 169*  BUN 31*  CREATININE 0.92  CALCIUM 9.6   GFR: CrCl cannot be calculated (Unknown ideal weight.). Liver Function Tests: Recent Labs  Lab 12/27/19 1211  AST 48*  ALT 35  ALKPHOS 57  BILITOT 0.7  PROT 7.0  ALBUMIN 3.8   No results for input(s): LIPASE, AMYLASE in the last 168 hours. No results for input(s): AMMONIA in the last 168 hours. Coagulation Profile: No results for input(s): INR, PROTIME in the last 168 hours. Cardiac Enzymes: No results for input(s): CKTOTAL, CKMB, CKMBINDEX, TROPONINI in the last 168 hours. BNP (last 3 results) No results for input(s): PROBNP in the last 8760 hours. HbA1C: No results for input(s): HGBA1C in the last 72 hours. CBG: No results for input(s): GLUCAP in the last 168 hours. Lipid Profile: Recent Labs    12/27/19 1211  TRIG 94   Thyroid Function Tests: No results for input(s): TSH, T4TOTAL, FREET4, T3FREE, THYROIDAB in the last 72 hours. Anemia Panel: Recent Labs    12/27/19 1211  FERRITIN 637*   Urine analysis:    Component Value Date/Time   COLORURINE YELLOW 10/04/2017 1544   APPEARANCEUR HAZY (A) 10/04/2017 1544   LABSPEC 1.004 (L) 10/04/2017 1544   PHURINE 7.0 10/04/2017 1544   GLUCOSEU NEGATIVE 10/04/2017 1544   HGBUR NEGATIVE 10/04/2017 [place] 10/04/[address] [place] [address] 10/04/2017 1544   PROTEINUR NEGATIVE  10/04/2017 1544   UROBILINOGEN 0.2 08/20/2015 1335   NITRITE POSITIVE (A) 10/04/2017 1544   LEUKOCYTESUR LARGE (A) 10/04/2017 1544    Radiological Exams on Admission: DG Chest Port 1 View  Result Date: 12/27/2019 CLINICAL DATA:  COVID exposure. Shortness of breath, fever and headache. EXAM: PORTABLE CHEST 1 VIEW COMPARISON:  12/27/2016 FINDINGS: Heart size normal. No pleural effusion or edema. Bilateral lower lung zone interstitial opacities are identified, new from previous exam. The visualized osseous structures appear intact. IMPRESSION: 1. Bilateral lower lung zone interstitial opacities. Imaging findings compatible with an inflammatory or infectious process. Atypical viral pneumonia not excluded. Electronically Signed   By: Kerby Moors M.D.   On: 12/27/2019 12:26    EKG: Independently reviewed. SR 75bpm.  Assessment/Plan Active Problems:   Acute hypoxemic respiratory failure due to COVID-19 [facility])    Acute hypoxemic respiratory failure secondary to COVID-19 pneumonia -We will start remdesivir per pharmacy consultation -Start IV dexamethasone daily -Breathing treatments with inhalers as needed for shortness of breath or wheezing -Wean oxygen to room air as tolerated -Incentive spirometry -Antitussives/symptomatic treatment  COPD -No active wheezing currently noted -Breathing treatments ordered as needed -Continue on dexamethasone  History of CAD with prior PCI in 2018 -Continue labetalol and pravastatin -Continue Imdur  Hypertension -Currently controlled  Dyslipidemia -Continue pravastatin other home meds -Continue omega-3 fatty acids  Osteoarthritis -No active symptoms currently  GERD -Continue PPI  Obesity -Chronic and will require lifestyle modification  Anxiety disorder -Continue BuSpar and Xanax  DVT prophylaxis: Lovenox Code Status: Full Family Communication: None  at bedside Disposition Plan: Admit for treatment of COVID-19 pneumonia Consults  called: None Admission status: Inpatient, MedSurg   Michaeljoseph Revolorio Darleen Crocker DO [facility] Pager [phone]  If 7PM-7AM, please contact night-coverage [email or web address] Password [facility]  12/27/2019, 3:04 PM

## 2019-12-27 NOTE — Progress Notes (Signed)
Patient saturation is 87 to 90 on 4 liters increased to 8 HFNC her saturation up to 91-92 f 28 . She does not show signs of being short of breath. Will continue to watch

## 2019-12-27 NOTE — Progress Notes (Signed)
Patient was on 4L nasal canula, O2 saturation was 86% to 90% with respirations 28 . RT increased patient to 8L high flow nasal canula and sat went to 92%. Most recent sat is 95% on 8L HFNC respirations now 24. All other vital signs WNL. Patient did state that she could tell a positive difference in her breathing once oxygen was increased. She is now resting with no other complaints at this time. Will continue to monitor. On call MD has been notified of new need for increase in oxygen.

## 2019-12-27 NOTE — ED Provider Notes (Signed)
This patient is an ill-appearing 74 year old female who presents with acute respiratory distress and respiratory failure secondary to coronavirus nineteen.  She and her husband have both been sick over the last several days after having an exposure around Christmas time.  She has had progressive shortness of breath and on exam has increased work of breathing, speaks in short sentences, he has wheezing and rales bilaterally and an oxygen level on supplemental oxygen that gets up to 89%.  The patient will need to be admitted for coronavirus pneumonia and acute hypoxic respiratory failure.  The patient is amenable to this plan, she is critically ill.   Medical screening examination/treatment/procedure(s) were conducted as a shared visit with non-physician practitioner(s) and myself.  I personally evaluated the patient during the encounter.  Clinical Impression:   Final diagnoses:  COVID-19  Shortness of breath  Hypoxia     EKG Interpretation  Date/Time:  Friday December 27 2019 13:56:29 EST Ventricular Rate:  75 PR Interval:    QRS Duration: 91 QT Interval:  434 QTC Calculation: 485 R Axis:   -6 Text Interpretation: Sinus rhythm Abnormal R-wave progression, early transition Left ventricular hypertrophy Borderline T abnormalities, diffuse leads since last tracing no significant change Confirmed by Noemi Chapel [phone]) on 12/27/2019 2:00:07 PM          Noemi Chapel, MD 01/06/20 1751

## 2019-12-28 DIAGNOSIS — I10 Essential (primary) hypertension: Secondary | ICD-10-CM

## 2019-12-28 DIAGNOSIS — U071 COVID-19: Secondary | ICD-10-CM

## 2019-12-28 DIAGNOSIS — D696 Thrombocytopenia, unspecified: Secondary | ICD-10-CM

## 2019-12-28 DIAGNOSIS — E669 Obesity, unspecified: Secondary | ICD-10-CM

## 2019-12-28 LAB — CBC WITH DIFFERENTIAL/PLATELET
Abs Immature Granulocytes: 0.01 10*3/uL (ref 0.00–0.07)
Basophils Absolute: 0 10*3/uL (ref 0.0–0.1)
Basophils Relative: 0 %
Eosinophils Absolute: 0 10*3/uL (ref 0.0–0.5)
Eosinophils Relative: 0 %
HCT: 48.3 % — ABNORMAL HIGH (ref 36.0–46.0)
Hemoglobin: 15.4 g/dL — ABNORMAL HIGH (ref 12.0–15.0)
Immature Granulocytes: 0 %
Lymphocytes Relative: 33 %
Lymphs Abs: 1 10*3/uL (ref 0.7–4.0)
MCH: 28.8 pg (ref 26.0–34.0)
MCHC: 31.9 g/dL (ref 30.0–36.0)
MCV: 90.4 fL (ref 80.0–100.0)
Monocytes Absolute: 0.4 10*3/uL (ref 0.1–1.0)
Monocytes Relative: 13 %
Neutro Abs: 1.6 10*3/uL — ABNORMAL LOW (ref 1.7–7.7)
Neutrophils Relative %: 54 %
Platelets: 144 10*3/uL — ABNORMAL LOW (ref 150–400)
RBC: 5.34 MIL/uL — ABNORMAL HIGH (ref 3.87–5.11)
RDW: 12.6 % (ref 11.5–15.5)
WBC: 3 10*3/uL — ABNORMAL LOW (ref 4.0–10.5)
nRBC: 0 % (ref 0.0–0.2)

## 2019-12-28 LAB — COMPREHENSIVE METABOLIC PANEL
ALT: 28 U/L (ref 0–44)
AST: 34 U/L (ref 15–41)
Albumin: 3.1 g/dL — ABNORMAL LOW (ref 3.5–5.0)
Alkaline Phosphatase: 48 U/L (ref 38–126)
Anion gap: 11 (ref 5–15)
BUN: 30 mg/dL — ABNORMAL HIGH (ref 8–23)
CO2: 23 mmol/L (ref 22–32)
Calcium: 9 mg/dL (ref 8.9–10.3)
Chloride: 105 mmol/L (ref 98–111)
Creatinine, Ser: 0.82 mg/dL (ref 0.44–1.00)
GFR calc Af Amer: >60 mL/min (ref >60)
GFR calc non Af Amer: >60 mL/min (ref >60)
Glucose, Bld: 226 mg/dL — ABNORMAL HIGH (ref 70–99)
Potassium: 4.4 mmol/L (ref 3.5–5.1)
Sodium: 139 mmol/L (ref 135–145)
Total Bilirubin: 0.6 mg/dL (ref 0.3–1.2)
Total Protein: 5.9 g/dL — ABNORMAL LOW (ref 6.5–8.1)

## 2019-12-28 LAB — D-DIMER, QUANTITATIVE: D-Dimer, Quant: 0.62 ug/mL-FEU — ABNORMAL HIGH (ref 0.00–0.50)

## 2019-12-28 LAB — C-REACTIVE PROTEIN: CRP: 1.2 mg/dL — ABNORMAL HIGH (ref <1.0)

## 2019-12-28 LAB — MAGNESIUM: Magnesium: 2.1 mg/dL (ref 1.7–2.4)

## 2019-12-28 LAB — FERRITIN: Ferritin: 517 ng/mL — ABNORMAL HIGH (ref 11–307)

## 2019-12-28 MED ORDER — ASCORBIC ACID 500 MG PO TABS
500.0000 mg | ORAL_TABLET | Freq: Two times a day (BID) | ORAL | Status: DC
  Administered 2019-12-28 – 2020-01-03 (×13): 500 mg via ORAL
  Filled 2019-12-28 (×13): qty 1

## 2019-12-28 MED ORDER — ZINC SULFATE 220 (50 ZN) MG PO CAPS
220.0000 mg | ORAL_CAPSULE | Freq: Every day | ORAL | Status: DC
  Administered 2019-12-28 – 2020-01-03 (×7): 220 mg via ORAL
  Filled 2019-12-28 (×7): qty 1

## 2019-12-28 MED ORDER — POLYETHYLENE GLYCOL 3350 17 G PO PACK
17.0000 g | PACK | Freq: Two times a day (BID) | ORAL | Status: DC
  Administered 2019-12-28 – 2019-12-31 (×6): 17 g via ORAL
  Filled 2019-12-28 (×10): qty 1

## 2019-12-28 MED ORDER — IPRATROPIUM-ALBUTEROL 20-100 MCG/ACT IN AERS
1.0000 | INHALATION_SPRAY | Freq: Four times a day (QID) | RESPIRATORY_TRACT | Status: DC
  Administered 2019-12-28 – 2020-01-03 (×26): 1 via RESPIRATORY_TRACT
  Filled 2019-12-28 (×2): qty 4

## 2019-12-28 MED ORDER — SENNA 8.6 MG PO TABS
2.0000 | ORAL_TABLET | Freq: Every day | ORAL | Status: DC
  Administered 2019-12-28 – 2020-01-03 (×6): 17.2 mg via ORAL
  Filled 2019-12-28 (×7): qty 2

## 2019-12-28 NOTE — Progress Notes (Signed)
PROGRESS NOTE  Vickie [name] [id number] DOB: 05-02-1946 DOA: 12/27/2019 PCP: Sharilyn Sites, MD  Brief History:  74 year old female with a history of coronary artery disease status post PCI 2018, COPD, hypertension, hyperlipidemia, GERD presenting with 2-day history of worsening shortness of breath and coughing.  The patient has had some associated generalized weakness and myalgias.  She stated that her symptoms began on 12/21/2019.  She has been exposed to numerous family members who have been diagnosed with COVID-19 over the holiday.  Notably, she stated that she was first exposed to her great granddaughter on 12/16/2019 who was tested positive for COVID-19.  Upon presentation, the patient was hypoxic requiring 4 L nasal cannula.  BMP and LFTs were essentially unremarkable.  WBC 6.0, hemoglobin 17.5, platelets 254,000.  Inflammatory markers were elevated with ferritin 637, CRP 1.2, lactic acid 2.0, D-dimer 0.92.  Chest x-ray showed bilateral patchy opacities.  The patient was started on IV steroids and remdesivir.  Assessment/Plan: Acute respiratory failure with hypoxia -Secondary to COVID-19 pneumonia -Presently stable on 4 L HFNC--> 94-95% -CRP 1.2>>> 1.2 -Ferritin 637>>> 517 -D-dimer 0.92>>> 0.62 -Fibrinogen 445 -PCT<0.10 -Lactic acid 2.0>>> 3.7 -Continue IV steroids and remdesivir  COPD -Start Combivent  Essential hypertension -Holding labetalol secondary to soft blood pressures and bradycardia  Coronary artery disease -No chest pain presently -Personally reviewed EKG--sinus rhythm, nonspecific T wave changes -Continue statin -Continue Imdur  Hyperlipidemia -Continue statin and fish oil  Anxiety/depression -Continue Xanax, BuSpar, and Remeron  Thrombocytopenia -due to infectious process -serial CBC    Disposition Plan:   Home in 2-3 days  Family Communication:   No Family at bedside  Consultants:  none  Code Status:  FULL   DVT Prophylaxis:  [place]  Lovenox   Procedures: As Listed in Progress Note Above  Antibiotics: None       Subjective: Patient states that her breathing is low but better than yesterday.  She still has some dyspnea on exertion.  She denies any fevers, chills, headache, chest pain, nausea, vomiting, diarrhea, abdominal pain.  Objective: Vitals:   12/28/19 0226 12/28/19 0237 12/28/19 0344 12/28/19 0623  BP:    107/62  Pulse:    (!) 57  Resp:    (!) 24  Temp:      TempSrc:      SpO2: 91% 94% 94% 94%  Weight:      Height:        Intake/Output Summary (Last 24 hours) at 12/28/2019 [id number] Last data filed at 12/28/2019 0500 Gross per 24 hour  Intake 110 ml  Output --  Net 110 ml   Weight change:  Exam:   General:  Pt is alert, follows commands appropriately, not in acute distress  HEENT: No icterus, No thrush, No neck mass, [place]/AT  Cardiovascular: RRR, S1/S2, no rubs, no gallops  Respiratory: Bilateral rales.  No wheezing.  Good air movement.  Abdomen: Soft/+BS, non tender, non distended, no guarding  Extremities: No edema, No lymphangitis, No petechiae, No rashes, no synovitis   Data Reviewed: I have personally reviewed following labs and imaging studies Basic Metabolic Panel: Recent Labs  Lab 12/27/19 1211 12/28/19 0749  NA 137 139  K 4.0 4.4  CL 96* 105  CO2 26 23  GLUCOSE 169* 226*  BUN 31* 30*  CREATININE 0.92 0.82  CALCIUM 9.6 9.0  MG  --  2.1   Liver Function Tests: Recent Labs  Lab 12/27/19 1211 12/28/19 0749  AST 48* 34  ALT 35 28  ALKPHOS 57 48  BILITOT 0.7 0.6  PROT 7.0 5.9*  ALBUMIN 3.8 3.1*   No results for input(s): LIPASE, AMYLASE in the last 168 hours. No results for input(s): AMMONIA in the last 168 hours. Coagulation Profile: No results for input(s): INR, PROTIME in the last 168 hours. CBC: Recent Labs  Lab 12/27/19 1211 12/28/19 0749  WBC 6.0 3.0*  NEUTROABS 4.1 1.6*  HGB 17.5* 15.4*  HCT 52.6* 48.3*  MCV 87.8 90.4  PLT 154 144*   Cardiac  Enzymes: No results for input(s): CKTOTAL, CKMB, CKMBINDEX, TROPONINI in the last 168 hours. BNP: Invalid input(s): POCBNP CBG: No results for input(s): GLUCAP in the last 168 hours. HbA1C: No results for input(s): HGBA1C in the last 72 hours. Urine analysis:    Component Value Date/Time   COLORURINE YELLOW 10/04/2017 1544   APPEARANCEUR HAZY (A) 10/04/2017 1544   LABSPEC 1.004 (L) 10/04/2017 1544   PHURINE 7.0 10/04/2017 1544   GLUCOSEU NEGATIVE 10/04/2017 1544   HGBUR NEGATIVE 10/04/2017 [place] 10/04/2017 1544   KETONESUR NEGATIVE 10/04/2017 1544   PROTEINUR NEGATIVE 10/04/2017 1544   UROBILINOGEN 0.2 08/20/2015 1335   NITRITE POSITIVE (A) 10/04/2017 1544   LEUKOCYTESUR LARGE (A) 10/04/2017 1544   Sepsis Labs: @LABRCNTIP (procalcitonin:4,lacticidven:4) ) Recent Results (from the past 240 hour(s))  Blood Culture (routine x 2)     Status: None (Preliminary result)   Collection Time: 12/27/19 12:27 PM   Specimen: BLOOD  Result Value Ref Range Status   Specimen Description BLOOD RIGHT ANTECUBITAL  Final   Special Requests   Final    BOTTLES DRAWN AEROBIC AND ANAEROBIC Blood Culture adequate volume   Culture   Final    NO GROWTH < 24 HOURS Performed at [facility], [address], [place], [place] [address]    Report Status PENDING  Incomplete  Blood Culture (routine x 2)     Status: None (Preliminary result)   Collection Time: 12/27/19 12:28 PM   Specimen: BLOOD  Result Value Ref Range Status   Specimen Description BLOOD LEFT ANTECUBITAL  Final   Special Requests   Final    BOTTLES DRAWN AEROBIC AND ANAEROBIC Blood Culture adequate volume   Culture   Final    NO GROWTH < 24 HOURS Performed at [facility], [address] [place], [place] [address]    Report Status PENDING  Incomplete     Scheduled Meds: . ALPRAZolam  0.25 mg Oral BID  . vitamin C  500 mg Oral BID  . busPIRone  15 mg Oral BID  . dexamethasone (DECADRON) injection  6 mg  Intravenous Q24H  . enoxaparin (LOVENOX) injection  40 mg Subcutaneous Q24H  . escitalopram  20 mg Oral q morning - 10a  . furosemide  20 mg Oral QODAY  . Ipratropium-Albuterol  1 puff Inhalation Q6H  . isosorbide mononitrate  60 mg Oral Daily  . mirtazapine  7.5 mg Oral QHS  . pantoprazole  40 mg Oral Daily  . potassium chloride SA  20 mEq Oral QODAY  . pravastatin  40 mg Oral Daily  . sodium chloride flush  3 mL Intravenous Q12H  . umeclidinium bromide  1 puff Inhalation Daily  . zinc sulfate  220 mg Oral Daily   Continuous Infusions: . sodium chloride    . remdesivir 100 mg in NS 100 mL      Procedures/Studies: DG CHEST PORT 1 VIEW  Result Date: 12/27/2019 CLINICAL  DATA:  Increased oxygen demand. COVID positive. EXAM: PORTABLE CHEST 1 VIEW COMPARISON:  Radiograph earlier this day at 12:11 hour FINDINGS: Similar patchy bilateral lung opacities, basilar predominant. No significant change from earlier today. Unchanged heart size and mediastinal contours. No pneumothorax or large pleural effusion. IMPRESSION: Patchy bilateral lung opacities consistent with COVID-19 pneumonia, unchanged from earlier this day. Electronically Signed   By: Keith Rake M.D.   On: 12/27/2019 23:45   DG Chest Port 1 View  Result Date: 12/27/2019 CLINICAL DATA:  COVID exposure. Shortness of breath, fever and headache. EXAM: PORTABLE CHEST 1 VIEW COMPARISON:  12/27/2016 FINDINGS: Heart size normal. No pleural effusion or edema. Bilateral lower lung zone interstitial opacities are identified, new from previous exam. The visualized osseous structures appear intact. IMPRESSION: 1. Bilateral lower lung zone interstitial opacities. Imaging findings compatible with an inflammatory or infectious process. Atypical viral pneumonia not excluded. Electronically Signed   By: Kerby Moors M.D.   On: 12/27/2019 12:26    Orson Eva, DO  [facility] Pager [phone]  If 7PM-7AM, please contact  night-coverage [email or web address] Password [email or web address] 12/28/2019, 9:18 AM   LOS: 1 day

## 2019-12-29 ENCOUNTER — Inpatient Hospital Stay (HOSPITAL_COMMUNITY): Payer: Medicare Other

## 2019-12-29 DIAGNOSIS — R6889 Other general symptoms and signs: Secondary | ICD-10-CM

## 2019-12-29 DIAGNOSIS — R0902 Hypoxemia: Secondary | ICD-10-CM

## 2019-12-29 DIAGNOSIS — Z9981 Dependence on supplemental oxygen: Secondary | ICD-10-CM

## 2019-12-29 LAB — CBC WITH DIFFERENTIAL/PLATELET
Abs Immature Granulocytes: 0.02 10*3/uL (ref 0.00–0.07)
Basophils Absolute: 0 10*3/uL (ref 0.0–0.1)
Basophils Relative: 0 %
Eosinophils Absolute: 0 10*3/uL (ref 0.0–0.5)
Eosinophils Relative: 0 %
HCT: 45.7 % (ref 36.0–46.0)
Hemoglobin: 14.8 g/dL (ref 12.0–15.0)
Immature Granulocytes: 1 %
Lymphocytes Relative: 23 %
Lymphs Abs: 1 10*3/uL (ref 0.7–4.0)
MCH: 28.8 pg (ref 26.0–34.0)
MCHC: 32.4 g/dL (ref 30.0–36.0)
MCV: 88.9 fL (ref 80.0–100.0)
Monocytes Absolute: 0.6 10*3/uL (ref 0.1–1.0)
Monocytes Relative: 14 %
Neutro Abs: 2.7 10*3/uL (ref 1.7–7.7)
Neutrophils Relative %: 62 %
Platelets: 199 10*3/uL (ref 150–400)
RBC: 5.14 MIL/uL — ABNORMAL HIGH (ref 3.87–5.11)
RDW: 12.8 % (ref 11.5–15.5)
WBC: 4.4 10*3/uL (ref 4.0–10.5)
nRBC: 0 % (ref 0.0–0.2)

## 2019-12-29 LAB — MAGNESIUM: Magnesium: 2 mg/dL (ref 1.7–2.4)

## 2019-12-29 LAB — COMPREHENSIVE METABOLIC PANEL
ALT: 25 U/L (ref 0–44)
AST: 30 U/L (ref 15–41)
Albumin: 3 g/dL — ABNORMAL LOW (ref 3.5–5.0)
Alkaline Phosphatase: 49 U/L (ref 38–126)
Anion gap: 11 (ref 5–15)
BUN: 31 mg/dL — ABNORMAL HIGH (ref 8–23)
CO2: 24 mmol/L (ref 22–32)
Calcium: 9.3 mg/dL (ref 8.9–10.3)
Chloride: 103 mmol/L (ref 98–111)
Creatinine, Ser: 0.74 mg/dL (ref 0.44–1.00)
GFR calc Af Amer: >60 mL/min (ref >60)
GFR calc non Af Amer: >60 mL/min (ref >60)
Glucose, Bld: 204 mg/dL — ABNORMAL HIGH (ref 70–99)
Potassium: 4.8 mmol/L (ref 3.5–5.1)
Sodium: 138 mmol/L (ref 135–145)
Total Bilirubin: 0.6 mg/dL (ref 0.3–1.2)
Total Protein: 5.7 g/dL — ABNORMAL LOW (ref 6.5–8.1)

## 2019-12-29 LAB — BRAIN NATRIURETIC PEPTIDE: B Natriuretic Peptide: 71 pg/mL (ref 0.0–100.0)

## 2019-12-29 LAB — HEMOGLOBIN A1C
Hgb A1c MFr Bld: 6.4 % — ABNORMAL HIGH (ref 4.8–5.6)
Mean Plasma Glucose: 136.98 mg/dL

## 2019-12-29 LAB — D-DIMER, QUANTITATIVE: D-Dimer, Quant: 0.47 ug/mL-FEU (ref 0.00–0.50)

## 2019-12-29 LAB — C-REACTIVE PROTEIN: CRP: 0.7 mg/dL (ref <1.0)

## 2019-12-29 LAB — FERRITIN: Ferritin: 514 ng/mL — ABNORMAL HIGH (ref 11–307)

## 2019-12-29 MED ORDER — IOHEXOL 350 MG/ML SOLN
100.0000 mL | Freq: Once | INTRAVENOUS | Status: AC | PRN
  Administered 2019-12-29: 16:00:00 100 mL via INTRAVENOUS

## 2019-12-29 MED ORDER — POLYVINYL ALCOHOL 1.4 % OP SOLN
1.0000 [drp] | OPHTHALMIC | Status: DC | PRN
  Administered 2019-12-30: 1 [drp] via OPHTHALMIC
  Filled 2019-12-29: qty 15

## 2019-12-29 MED ORDER — PHENOL 1.4 % MT LIQD
1.0000 | OROMUCOSAL | Status: DC | PRN
  Administered 2019-12-30: 12:00:00 1 via OROMUCOSAL
  Filled 2019-12-29: qty 177

## 2019-12-29 NOTE — Progress Notes (Signed)
Patients oxygen saturation runs low 87 to 90 on 5 liters , she doe not have any sign of respiratory distress, she does breath about 28 times a minute. Incentive is about 400cc , Lung sounds are clear diminished.

## 2019-12-29 NOTE — Progress Notes (Signed)
PROGRESS NOTE  Vickie [name] [id number] DOB: 1946-11-15 DOA: 12/27/2019 PCP: Sharilyn Sites, MD  Brief History:  74 year old female with a history of coronary artery disease status post PCI 2018, COPD, hypertension, hyperlipidemia, GERD presenting with 2-day history of worsening shortness of breath and coughing.  The patient has had some associated generalized weakness and myalgias.  She stated that her symptoms began on 12/21/2019.  She has been exposed to numerous family members who have been diagnosed with COVID-19 over the holiday.  Notably, she stated that she was first exposed to her great granddaughter on 12/16/2019 who was tested positive for COVID-19.  Upon presentation, the patient was hypoxic requiring 4 L nasal cannula.  BMP and LFTs were essentially unremarkable.  WBC 6.0, hemoglobin 17.5, platelets 254,000.  Inflammatory markers were elevated with ferritin 637, CRP 1.2, lactic acid 2.0, D-dimer 0.92.  Chest x-ray showed bilateral patchy opacities.  The patient was started on IV steroids and remdesivir.  Assessment/Plan: Acute respiratory failure with hypoxia -Secondary to COVID-19 pneumonia -Presently stable on 5-6 L HFNC--> 92% -CRP 1.2>>> 1.2>>>0.7 -Ferritin 637>>> 517>>514 -D-dimer 0.92>>> 0.62>>0.47 -Fibrinogen 445 -PCT<0.10 -Lactic acid 2.0>>> 3.7 -Continue IV steroids and remdesivir -due to persistent hypoxia and increase in oxygen demand-->CTA chest  COPD -Continue Combivent  Essential hypertension -Holding labetalol secondary to soft blood pressures and bradycardia  Coronary artery disease -No chest pain presently -Personally reviewed EKG--sinus rhythm, nonspecific T wave changes -Continue statin -Continue Imdur  Hyperlipidemia -Continue statin and fish oil  Anxiety/depression -Continue Xanax, BuSpar, and Remeron  Thrombocytopenia -due to infectious process -slowly improving -serial CBC    Disposition Plan:   Home in 2-3  days  Family Communication:   No Family at bedside  Consultants:  none  Code Status:  FULL   DVT Prophylaxis:  [place] Lovenox   Procedures: As Listed in Progress Note Above  Antibiotics: None  Subjective: Pt feel that she is breathing better.  She denies cp, n/v/d, abd pain.  She is resting well. Denies f/c, headache.  No hemoptysis.  Objective: Vitals:   12/28/19 2056 12/29/19 0132 12/29/19 0551 12/29/19 1325  BP: 122/63  (!) 115/43 120/60  Pulse: 64  65 65  Resp: 20  16 18   Temp: 98 F (36.7 C)  98.1 F (36.7 C) 98.3 F (36.8 C)  TempSrc: Oral  Oral Oral  SpO2: 94% 90% 91% 91%  Weight:      Height:        Intake/Output Summary (Last 24 hours) at 12/29/2019 1421 Last data filed at 12/29/2019 1400 Gross per 24 hour  Intake 920 ml  Output --  Net 920 ml   Weight change:  Exam:   General:  Pt is alert, follows commands appropriately, not in acute distress  HEENT: No icterus, No thrush, No neck mass, [place]/AT  Cardiovascular: RRR, S1/S2, no rubs, no gallops  Respiratory: bilateral crackles. No wheeze  Abdomen: Soft/+BS, non tender, non distended, no guarding  Extremities: trace nonpitting edema, No lymphangitis, No petechiae, No rashes, no synovitis   Data Reviewed: I have personally reviewed following labs and imaging studies Basic Metabolic Panel: Recent Labs  Lab 12/27/19 1211 12/28/19 0749 12/29/19 0709  NA 137 139 138  K 4.0 4.4 4.8  CL 96* 105 103  CO2 26 23 24   GLUCOSE 169* 226* 204*  BUN 31* 30* 31*  CREATININE 0.92 0.82 0.74  CALCIUM 9.6 9.0 9.3  MG  --  2.1 2.0  Liver Function Tests: Recent Labs  Lab 12/27/19 1211 12/28/19 0749 12/29/19 0709  AST 48* 34 30  ALT 35 28 25  ALKPHOS 57 48 49  BILITOT 0.7 0.6 0.6  PROT 7.0 5.9* 5.7*  ALBUMIN 3.8 3.1* 3.0*   No results for input(s): LIPASE, AMYLASE in the last 168 hours. No results for input(s): AMMONIA in the last 168 hours. Coagulation Profile: No results for input(s): INR,  PROTIME in the last 168 hours. CBC: Recent Labs  Lab 12/27/19 1211 12/28/19 0749 12/29/19 0709  WBC 6.0 3.0* 4.4  NEUTROABS 4.1 1.6* 2.7  HGB 17.5* 15.4* 14.8  HCT 52.6* 48.3* 45.7  MCV 87.8 90.4 88.9  PLT 154 144* 199   Cardiac Enzymes: No results for input(s): CKTOTAL, CKMB, CKMBINDEX, TROPONINI in the last 168 hours. BNP: Invalid input(s): POCBNP CBG: No results for input(s): GLUCAP in the last 168 hours. HbA1C: No results for input(s): HGBA1C in the last 72 hours. Urine analysis:    Component Value Date/Time   COLORURINE YELLOW 10/04/2017 1544   APPEARANCEUR HAZY (A) 10/04/2017 1544   LABSPEC 1.004 (L) 10/04/2017 1544   PHURINE 7.0 10/04/2017 1544   GLUCOSEU NEGATIVE 10/04/2017 1544   HGBUR NEGATIVE 10/04/2017 [place] 10/04/2017 1544   KETONESUR NEGATIVE 10/04/2017 1544   PROTEINUR NEGATIVE 10/04/2017 1544   UROBILINOGEN 0.2 08/20/2015 1335   NITRITE POSITIVE (A) 10/04/2017 1544   LEUKOCYTESUR LARGE (A) 10/04/2017 1544   Sepsis Labs: @LABRCNTIP (procalcitonin:4,lacticidven:4) ) Recent Results (from the past 240 hour(s))  Blood Culture (routine x 2)     Status: None (Preliminary result)   Collection Time: 12/27/19 12:27 PM   Specimen: BLOOD  Result Value Ref Range Status   Specimen Description BLOOD RIGHT ANTECUBITAL  Final   Special Requests   Final    BOTTLES DRAWN AEROBIC AND ANAEROBIC Blood Culture adequate volume   Culture   Final    NO GROWTH < 24 HOURS Performed at [facility], [address], [place], [place] [address]    Report Status PENDING  Incomplete  Blood Culture (routine x 2)     Status: None (Preliminary result)   Collection Time: 12/27/19 12:28 PM   Specimen: BLOOD  Result Value Ref Range Status   Specimen Description BLOOD LEFT ANTECUBITAL  Final   Special Requests   Final    BOTTLES DRAWN AEROBIC AND ANAEROBIC Blood Culture adequate volume   Culture   Final    NO GROWTH < 24 HOURS Performed at [facility], [address] [place],  [address]    Report Status PENDING  Incomplete     Scheduled Meds: . ALPRAZolam  0.25 mg Oral BID  . vitamin C  500 mg Oral BID  . busPIRone  15 mg Oral BID  . dexamethasone (DECADRON) injection  6 mg Intravenous Q24H  . enoxaparin (LOVENOX) injection  40 mg Subcutaneous Q24H  . escitalopram  20 mg Oral q morning - 10a  . furosemide  20 mg Oral QODAY  . Ipratropium-Albuterol  1 puff Inhalation Q6H  . isosorbide mononitrate  60 mg Oral Daily  . mirtazapine  7.5 mg Oral QHS  . pantoprazole  40 mg Oral Daily  . polyethylene glycol  17 g Oral BID  . pravastatin  40 mg Oral Daily  . senna  2 tablet Oral Daily  . sodium chloride flush  3 mL Intravenous Q12H  . umeclidinium bromide  1 puff Inhalation Daily  . zinc sulfate  220 mg Oral  Daily   Continuous Infusions: . sodium chloride    . remdesivir 100 mg in NS 100 mL 100 mg (12/29/19 1002)    Procedures/Studies: DG CHEST PORT 1 VIEW  Result Date: 12/27/2019 CLINICAL DATA:  Increased oxygen demand. COVID positive. EXAM: PORTABLE CHEST 1 VIEW COMPARISON:  Radiograph earlier this day at 12:11 hour FINDINGS: Similar patchy bilateral lung opacities, basilar predominant. No significant change from earlier today. Unchanged heart size and mediastinal contours. No pneumothorax or large pleural effusion. IMPRESSION: Patchy bilateral lung opacities consistent with COVID-19 pneumonia, unchanged from earlier this day. Electronically Signed   By: Keith Rake M.D.   On: 12/27/2019 23:45   DG Chest Port 1 View  Result Date: 12/27/2019 CLINICAL DATA:  COVID exposure. Shortness of breath, fever and headache. EXAM: PORTABLE CHEST 1 VIEW COMPARISON:  12/27/2016 FINDINGS: Heart size normal. No pleural effusion or edema. Bilateral lower lung zone interstitial opacities are identified, new from previous exam. The visualized osseous structures appear intact. IMPRESSION: 1. Bilateral lower lung zone interstitial opacities.  Imaging findings compatible with an inflammatory or infectious process. Atypical viral pneumonia not excluded. Electronically Signed   By: Kerby Moors M.D.   On: 12/27/2019 12:26    Orson Eva, DO  [facility] Pager [phone]  If 7PM-7AM, please contact night-coverage [email or web address] Password [email or web address] 12/29/2019, 2:21 PM   LOS: 2 days

## 2019-12-30 DIAGNOSIS — J9601 Acute respiratory failure with hypoxia: Secondary | ICD-10-CM

## 2019-12-30 LAB — C-REACTIVE PROTEIN: CRP: 0.6 mg/dL (ref <1.0)

## 2019-12-30 LAB — COMPREHENSIVE METABOLIC PANEL
ALT: 22 U/L (ref 0–44)
AST: 28 U/L (ref 15–41)
Albumin: 2.9 g/dL — ABNORMAL LOW (ref 3.5–5.0)
Alkaline Phosphatase: 51 U/L (ref 38–126)
Anion gap: 9 (ref 5–15)
BUN: 25 mg/dL — ABNORMAL HIGH (ref 8–23)
CO2: 25 mmol/L (ref 22–32)
Calcium: 8.9 mg/dL (ref 8.9–10.3)
Chloride: 105 mmol/L (ref 98–111)
Creatinine, Ser: 0.76 mg/dL (ref 0.44–1.00)
GFR calc Af Amer: >60 mL/min (ref >60)
GFR calc non Af Amer: >60 mL/min (ref >60)
Glucose, Bld: 211 mg/dL — ABNORMAL HIGH (ref 70–99)
Potassium: 4.5 mmol/L (ref 3.5–5.1)
Sodium: 139 mmol/L (ref 135–145)
Total Bilirubin: 0.6 mg/dL (ref 0.3–1.2)
Total Protein: 5.6 g/dL — ABNORMAL LOW (ref 6.5–8.1)

## 2019-12-30 LAB — D-DIMER, QUANTITATIVE: D-Dimer, Quant: 0.36 ug/mL-FEU (ref 0.00–0.50)

## 2019-12-30 LAB — CBC WITH DIFFERENTIAL/PLATELET
Abs Immature Granulocytes: 0.03 10*3/uL (ref 0.00–0.07)
Basophils Absolute: 0 10*3/uL (ref 0.0–0.1)
Basophils Relative: 0 %
Eosinophils Absolute: 0 10*3/uL (ref 0.0–0.5)
Eosinophils Relative: 0 %
HCT: 47 % — ABNORMAL HIGH (ref 36.0–46.0)
Hemoglobin: 15.3 g/dL — ABNORMAL HIGH (ref 12.0–15.0)
Immature Granulocytes: 1 %
Lymphocytes Relative: 21 %
Lymphs Abs: 0.9 10*3/uL (ref 0.7–4.0)
MCH: 29 pg (ref 26.0–34.0)
MCHC: 32.6 g/dL (ref 30.0–36.0)
MCV: 89 fL (ref 80.0–100.0)
Monocytes Absolute: 0.5 10*3/uL (ref 0.1–1.0)
Monocytes Relative: 12 %
Neutro Abs: 2.9 10*3/uL (ref 1.7–7.7)
Neutrophils Relative %: 66 %
Platelets: 216 10*3/uL (ref 150–400)
RBC: 5.28 MIL/uL — ABNORMAL HIGH (ref 3.87–5.11)
RDW: 12.7 % (ref 11.5–15.5)
WBC: 4.4 10*3/uL (ref 4.0–10.5)
nRBC: 0 % (ref 0.0–0.2)

## 2019-12-30 LAB — FERRITIN: Ferritin: 568 ng/mL — ABNORMAL HIGH (ref 11–307)

## 2019-12-30 LAB — MAGNESIUM: Magnesium: 2 mg/dL (ref 1.7–2.4)

## 2019-12-30 NOTE — Progress Notes (Signed)
PROGRESS NOTE  Vickie [name] [id number] DOB: [birth year] DOA: 12/27/2019 PCP: Sharilyn Sites, MD  Brief History: 74 year old female with a history of coronary artery disease status post PCI 2018, COPD, hypertension, hyperlipidemia, GERD presenting with 2-day history of worsening shortness of breath and coughing. The patient has had some associated generalized weakness and myalgias. She stated that her symptoms began on 12/21/2019. She has been exposed to numerous family members who have been diagnosed with COVID-19 over the holiday. Notably, she stated that she was first exposed to her great granddaughter on 12/21/2020who was tested positive for COVID-19. Upon presentation, the patient was hypoxic requiring 4 L nasal cannula. BMP and LFTs were essentially unremarkable. WBC 6.0, hemoglobin 17.5, platelets 254,000. Inflammatory markers were elevated with ferritin 637, CRP 1.2, lactic acid 2.0, D-dimer 0.92. Chest x-ray showed bilateral patchy opacities. The patient was started on IV steroids and remdesivir.  Assessment/Plan: Acute respiratory failure with hypoxia -Secondary to COVID-19 pneumonia -Presently stable on6LHFNC-->85-86% -CRP 1.2>>>1.2>>>0.7>>>0.6 -Ferritin 637>>>517>>514>>568 -D-dimer 0.92>>>0.62>>0.47>>0.36 -Fibrinogen 445 -PCT<0.10 -Lactic acid 2.0>>>3.7 -Continue IV steroids and remdesivir -CTA chest--no PE; extensive bilateral GGO and interstitial thickening -encouraged pt to lay prone for 2 hours at a time if can be tolerated -encouraged incentive spirometry -heated HFNC  COPD -ContinueCombivent  Essential hypertension -Holding labetalol secondary to soft blood pressures and bradycardia  Coronary artery disease -No chest pain presently -Personally reviewed EKG--sinus rhythm, nonspecific T wave changes -Continue statin -Continue Imdur  Hyperlipidemia -Continue statin and fish oil  Anxiety/depression -Continue Xanax, BuSpar,  and Remeron  Thrombocytopenia -due to infectious process -improved with treatment -serial CBC  Goals of Care -Advance care planning, including the explanation and discussion of advance directives was carried out with the patient and family.  Code status including explanations of "Full Code" and "DNR" and alternatives were discussed in detail.  Discussion of end-of-life issues including but not limited palliative care, hospice care and the concept of hospice, other end-of-life care options, power of attorney for health care decisions, living wills, and physician orders for life-sustaining treatment were also discussed with the patient and family.  Total face to face time 16 minutes. -pt confirmed wishes to be DNR    Disposition Plan: Not stable for discharge Family Communication:NoFamily at bedside  Consultants:none  Code Status: FULL   DVT Prophylaxis: [place] Lovenox   Procedures: As Listed in Progress Note Above  Antibiotics: None      Subjective: Pt remains sob, but slowly improving.  Patient denies fevers, chills, headache, chest pain, nausea, vomiting, diarrhea, abdominal pain, dysuria, hematuria, hematochezia, and melena.   Objective: Vitals:   12/29/19 2207 12/30/19 0245 12/30/19 0522 12/30/19 0900  BP: 124/78  (!) 124/107   Pulse: (!) 45  67   Resp: 20  16   Temp: 99 F (37.2 C)  97.8 F (36.6 C)   TempSrc: Oral  Oral   SpO2: (!) 86% (!) 86% (!) 86% (!) 88%  Weight:      Height:        Intake/Output Summary (Last 24 hours) at 12/30/2019 1219 Last data filed at 12/29/2019 1748 Gross per 24 hour  Intake 480 ml  Output --  Net 480 ml   Weight change:  Exam:   General:  Pt is alert, follows commands appropriately, not in acute distress  HEENT: No icterus, No thrush, No neck mass, [place]/AT  Cardiovascular: RRR, S1/S2, no rubs, no gallops  Respiratory: bibasilar rales. No wheeze  Abdomen: Soft/+BS,  non tender, non distended, no  guarding  Extremities: Trace LE edema, No lymphangitis, No petechiae, No rashes, no synovitis   Data Reviewed: I have personally reviewed following labs and imaging studies Basic Metabolic Panel: Recent Labs  Lab 12/27/19 1211 12/28/19 0749 12/29/19 0709 12/30/19 0553  NA 137 139 138 139  K 4.0 4.4 4.8 4.5  CL 96* 105 103 105  CO2 26 23 24 25   GLUCOSE 169* 226* 204* 211*  BUN 31* 30* 31* 25*  CREATININE 0.92 0.82 0.74 0.76  CALCIUM 9.6 9.0 9.3 8.9  MG  --  2.1 2.0 2.0   Liver Function Tests: Recent Labs  Lab 12/27/19 1211 12/28/19 0749 12/29/19 0709 12/30/19 0553  AST 48* 34 30 28  ALT 35 28 25 22   ALKPHOS 57 48 49 51  BILITOT 0.7 0.6 0.6 0.6  PROT 7.0 5.9* 5.7* 5.6*  ALBUMIN 3.8 3.1* 3.0* 2.9*   No results for input(s): LIPASE, AMYLASE in the last 168 hours. No results for input(s): AMMONIA in the last 168 hours. Coagulation Profile: No results for input(s): INR, PROTIME in the last 168 hours. CBC: Recent Labs  Lab 12/27/19 1211 12/28/19 0749 12/29/19 0709 12/30/19 0553  WBC 6.0 3.0* 4.4 4.4  NEUTROABS 4.1 1.6* 2.7 2.9  HGB 17.5* 15.4* 14.8 15.3*  HCT 52.6* 48.3* 45.7 47.0*  MCV 87.8 90.4 88.9 89.0  PLT 154 144* 199 216   Cardiac Enzymes: No results for input(s): CKTOTAL, CKMB, CKMBINDEX, TROPONINI in the last 168 hours. BNP: Invalid input(s): POCBNP CBG: No results for input(s): GLUCAP in the last 168 hours. HbA1C: Recent Labs    12/29/19 0709  HGBA1C 6.4*   Urine analysis:    Component Value Date/Time   COLORURINE YELLOW 10/04/2017 1544   APPEARANCEUR HAZY (A) 10/04/2017 1544   LABSPEC 1.004 (L) 10/04/2017 1544   PHURINE 7.0 10/04/2017 1544   GLUCOSEU NEGATIVE 10/04/2017 1544   HGBUR NEGATIVE 10/04/2017 1544   BILIRUBINUR NEGATIVE 10/04/2017 1544   KETONESUR NEGATIVE 10/04/2017 1544   PROTEINUR NEGATIVE 10/04/2017 1544   UROBILINOGEN 0.2 08/20/2015 1335   NITRITE POSITIVE (A) 10/04/2017 1544   LEUKOCYTESUR LARGE (A) 10/04/2017 1544    Sepsis Labs: @LABRCNTIP (procalcitonin:4,lacticidven:4) ) Recent Results (from the past 240 hour(s))  Blood Culture (routine x 2)     Status: None (Preliminary result)   Collection Time: 12/27/19 12:27 PM   Specimen: BLOOD  Result Value Ref Range Status   Specimen Description BLOOD RIGHT ANTECUBITAL  Final   Special Requests   Final    BOTTLES DRAWN AEROBIC AND ANAEROBIC Blood Culture adequate volume   Culture   Final    NO GROWTH 3 DAYS Performed at [facility], [address], [place], [place] [address]    Report Status PENDING  Incomplete  Blood Culture (routine x 2)     Status: None (Preliminary result)   Collection Time: 12/27/19 12:28 PM   Specimen: BLOOD  Result Value Ref Range Status   Specimen Description BLOOD LEFT ANTECUBITAL  Final   Special Requests   Final    BOTTLES DRAWN AEROBIC AND ANAEROBIC Blood Culture adequate volume   Culture   Final    NO GROWTH 3 DAYS Performed at [facility], [address] [place], [place] [address]    Report Status PENDING  Incomplete     Scheduled Meds: . ALPRAZolam  0.25 mg Oral BID  . vitamin C  500 mg Oral BID  . busPIRone  15 mg Oral BID  . dexamethasone (  DECADRON) injection  6 mg Intravenous Q24H  . enoxaparin (LOVENOX) injection  40 mg Subcutaneous Q24H  . escitalopram  20 mg Oral q morning - 10a  . furosemide  20 mg Oral QODAY  . Ipratropium-Albuterol  1 puff Inhalation Q6H  . isosorbide mononitrate  60 mg Oral Daily  . mirtazapine  7.5 mg Oral QHS  . pantoprazole  40 mg Oral Daily  . polyethylene glycol  17 g Oral BID  . pravastatin  40 mg Oral Daily  . senna  2 tablet Oral Daily  . sodium chloride flush  3 mL Intravenous Q12H  . umeclidinium bromide  1 puff Inhalation Daily  . zinc sulfate  220 mg Oral Daily   Continuous Infusions: . sodium chloride    . remdesivir 100 mg in NS 100 mL 100 mg (12/30/19 1148)    Procedures/Studies: CT ANGIO CHEST PE W OR WO CONTRAST  Result Date: 12/29/2019 CLINICAL DATA:   Short of breath. COVID pneumonia. Elevated D-dimer and hypoxia. EXAM: CT ANGIOGRAPHY CHEST WITH CONTRAST TECHNIQUE: Multidetector CT imaging of the chest was performed using the standard protocol during bolus administration of intravenous contrast. Multiplanar CT image reconstructions and MIPs were obtained to evaluate the vascular anatomy. CONTRAST:  161mL OMNIPAQUE IOHEXOL 350 MG/ML SOLN COMPARISON:  08/23/2017 FINDINGS: Cardiovascular: The main pulmonary artery and its branches are patent. There is no saddle embolus or central obstructing pulmonary artery embolus. No suspicious filling defects within the lobar or segmental pulmonary arteries. Mediastinum/Nodes: 1.9 cm low-density nodule in left lobe of thyroid gland noted. Right lobe of thyroid gland unremarkable. The trachea appears patent and is midline. Small hiatal hernia. Esophagus otherwise unremarkable. No thoracic adenopathy identified. Lungs/Pleura: Extensive, multifocal bilateral areas of ground-glass attenuation and interstitial thickening noted throughout both lungs compatible with multifocal atypical viral pneumonia. No pleural effusion or pneumothorax. No atelectasis. Upper Abdomen: Liver cysts are again identified. No acute abnormality identified. Musculoskeletal: Spondylosis within the thoracic spine. Moderate degenerative changes noted within both glenohumeral joints. Review of the MIP images confirms the above findings. IMPRESSION: 1. No evidence for acute pulmonary embolus. 2. Multifocal bilateral areas of ground-glass attenuation and interstitial thickening compatible with multifocal atypical viral pneumonia. 3. Aortic atherosclerosis. 4. Left lobe of thyroid gland nodule. Recommend nonemergent thyroid US(ref: J Am Coll Radiol. 2015 Feb;12(2): 143-50). Aortic Atherosclerosis (ICD10-I70.0). Electronically Signed   By: Kerby Moors M.D.   On: 12/29/2019 16:12   DG CHEST PORT 1 VIEW  Result Date: 12/27/2019 CLINICAL DATA:  Increased oxygen  demand. COVID positive. EXAM: PORTABLE CHEST 1 VIEW COMPARISON:  Radiograph earlier this day at 12:11 hour FINDINGS: Similar patchy bilateral lung opacities, basilar predominant. No significant change from earlier today. Unchanged heart size and mediastinal contours. No pneumothorax or large pleural effusion. IMPRESSION: Patchy bilateral lung opacities consistent with COVID-19 pneumonia, unchanged from earlier this day. Electronically Signed   By: Keith Rake M.D.   On: 12/27/2019 23:45   DG Chest Port 1 View  Result Date: 12/27/2019 CLINICAL DATA:  COVID exposure. Shortness of breath, fever and headache. EXAM: PORTABLE CHEST 1 VIEW COMPARISON:  12/27/2016 FINDINGS: Heart size normal. No pleural effusion or edema. Bilateral lower lung zone interstitial opacities are identified, new from previous exam. The visualized osseous structures appear intact. IMPRESSION: 1. Bilateral lower lung zone interstitial opacities. Imaging findings compatible with an inflammatory or infectious process. Atypical viral pneumonia not excluded. Electronically Signed   By: Kerby Moors M.D.   On: 12/27/2019 12:26    Orson Eva,  DO  [facility] Pager [phone]  If 7PM-7AM, please contact night-coverage [email or web address] Password [email or web address] 12/30/2019, 12:19 PM   LOS: 3 days

## 2019-12-31 ENCOUNTER — Encounter (HOSPITAL_COMMUNITY): Payer: Self-pay | Admitting: Internal Medicine

## 2019-12-31 DIAGNOSIS — Z515 Encounter for palliative care: Secondary | ICD-10-CM

## 2019-12-31 DIAGNOSIS — U071 COVID-19: Principal | ICD-10-CM

## 2019-12-31 DIAGNOSIS — J9601 Acute respiratory failure with hypoxia: Secondary | ICD-10-CM

## 2019-12-31 DIAGNOSIS — J1282 Pneumonia due to coronavirus disease 2019: Secondary | ICD-10-CM

## 2019-12-31 DIAGNOSIS — Z7189 Other specified counseling: Secondary | ICD-10-CM

## 2019-12-31 LAB — COMPREHENSIVE METABOLIC PANEL
ALT: 23 U/L (ref 0–44)
AST: 26 U/L (ref 15–41)
Albumin: 2.9 g/dL — ABNORMAL LOW (ref 3.5–5.0)
Alkaline Phosphatase: 52 U/L (ref 38–126)
Anion gap: 9 (ref 5–15)
BUN: 21 mg/dL (ref 8–23)
CO2: 23 mmol/L (ref 22–32)
Calcium: 8.6 mg/dL — ABNORMAL LOW (ref 8.9–10.3)
Chloride: 105 mmol/L (ref 98–111)
Creatinine, Ser: 0.62 mg/dL (ref 0.44–1.00)
GFR calc Af Amer: >60 mL/min (ref >60)
GFR calc non Af Amer: >60 mL/min (ref >60)
Glucose, Bld: 287 mg/dL — ABNORMAL HIGH (ref 70–99)
Potassium: 4.5 mmol/L (ref 3.5–5.1)
Sodium: 137 mmol/L (ref 135–145)
Total Bilirubin: 0.7 mg/dL (ref 0.3–1.2)
Total Protein: 5.2 g/dL — ABNORMAL LOW (ref 6.5–8.1)

## 2019-12-31 LAB — CBC WITH DIFFERENTIAL/PLATELET
Abs Immature Granulocytes: 0.04 10*3/uL (ref 0.00–0.07)
Basophils Absolute: 0 10*3/uL (ref 0.0–0.1)
Basophils Relative: 0 %
Eosinophils Absolute: 0 10*3/uL (ref 0.0–0.5)
Eosinophils Relative: 0 %
HCT: 45.7 % (ref 36.0–46.0)
Hemoglobin: 14.9 g/dL (ref 12.0–15.0)
Immature Granulocytes: 1 %
Lymphocytes Relative: 18 %
Lymphs Abs: 0.9 10*3/uL (ref 0.7–4.0)
MCH: 29 pg (ref 26.0–34.0)
MCHC: 32.6 g/dL (ref 30.0–36.0)
MCV: 89.1 fL (ref 80.0–100.0)
Monocytes Absolute: 0.4 10*3/uL (ref 0.1–1.0)
Monocytes Relative: 9 %
Neutro Abs: 3.4 10*3/uL (ref 1.7–7.7)
Neutrophils Relative %: 72 %
Platelets: 247 10*3/uL (ref 150–400)
RBC: 5.13 MIL/uL — ABNORMAL HIGH (ref 3.87–5.11)
RDW: 12.6 % (ref 11.5–15.5)
WBC: 4.8 10*3/uL (ref 4.0–10.5)
nRBC: 0 % (ref 0.0–0.2)

## 2019-12-31 LAB — D-DIMER, QUANTITATIVE: D-Dimer, Quant: 0.77 ug/mL-FEU — ABNORMAL HIGH (ref 0.00–0.50)

## 2019-12-31 LAB — C-REACTIVE PROTEIN: CRP: 0.6 mg/dL (ref <1.0)

## 2019-12-31 LAB — MAGNESIUM: Magnesium: 2.1 mg/dL (ref 1.7–2.4)

## 2019-12-31 LAB — FERRITIN: Ferritin: 467 ng/mL — ABNORMAL HIGH (ref 11–307)

## 2019-12-31 NOTE — Progress Notes (Signed)
PROGRESS NOTE  Vickie [name] [id number] DOB: 27-Dec-1945 DOA: 12/27/2019 PCP: Sharilyn Sites, MD  Brief History: 74 year old female with a history of coronary artery disease status post PCI 2018, COPD, hypertension, hyperlipidemia, GERD presenting with 2-day history of worsening shortness of breath and coughing. The patient has had some associated generalized weakness and myalgias. She stated that her symptoms began on 12/21/2019. She has been exposed to numerous family members who have been diagnosed with COVID-19 over the holiday. Notably, she stated that she was first exposed to her great granddaughter on 12/21/2020who was tested positive for COVID-19. Upon presentation, the patient was hypoxic requiring 4 L nasal cannula. BMP and LFTs were essentially unremarkable. WBC 6.0, hemoglobin 17.5, platelets 254,000. Inflammatory markers were elevated with ferritin 637, CRP 1.2, lactic acid 2.0, D-dimer 0.92. Chest x-ray showed bilateral patchy opacities. The patient was started on IV steroids and remdesivir.  She finished remdesivir on 12/31/19, but remained significantly hypoxic.  Even though patient was less dyspneic and inflammatory markers were improving, she required 6L HFNC with saturation in mid 80s.  Pulmonary was consulted to assist.  Assessment/Plan: Acute respiratory failure with hypoxia -Secondary to COVID-19 pneumonia -Presently stable on6LHFNC-->84-85% -CRP 1.2>>>1.2>>>0.7>>>0.6>>>0.6 -Ferritin 637>>>517>>514>>568>>>467 -D-dimer 0.92>>>0.62>>0.47>>0.36>>0.77 -Fibrinogen 445 -PCT<0.10 -Lactic acid 2.0>>>3.7 -Continue IV steroids and remdesivir -CTA chest--no PE; extensive bilateral GGO and interstitial thickening -encouraged pt to lay prone for 2 hours at a time if can be tolerated -encouraged incentive spirometry -pulmonary consult  COPD -ContinueCombivent  Essential hypertension -Holding labetalol secondary to soft blood pressures and  bradycardia -BP remains well controlled  Coronary artery disease -No chest pain presently -Personally reviewed EKG--sinus rhythm, nonspecific T wave changes -Continue statin -Continue Imdur  Hyperlipidemia -Continue statin and fish oil  Anxiety/depression -Continue Xanax, BuSpar, and Remeron  Thrombocytopenia -due to infectious process -improved with treatment -serial CBC  Goals of Care -Advance care planning, including the explanation and discussion of advance directives was carried out with the patient and family.  Code status including explanations of "Full Code" and "DNR" and alternatives were discussed in detail.  Discussion of end-of-life issues including but not limited palliative care, hospice care and the concept of hospice, other end-of-life care options, power of attorney for health care decisions, living wills, and physician orders for life-sustaining treatment were also discussed with the patient and family.  Total face to face time 16 minutes. -pt confirmed wishes to be DNI    Disposition Plan: Not stable for discharge Family Communication:NoFamily at bedside  Consultants:pulmonary  Code Status: FULL   DVT Prophylaxis: [place] Lovenox   Procedures: As Listed in Progress Note Above  Antibiotics: None  Subjective: Patient denies fevers, chills, headache, chest pain, dyspnea, nausea, vomiting, diarrhea, abdominal pain, dysuria, hematuria, hematochezia, and melena.   Objective: Vitals:   12/30/19 1500 12/30/19 2119 12/30/19 2200 12/31/19 0548  BP:  127/67  (!) 100/53  Pulse:  72  62  Resp:  20  18  Temp:  98.4 F (36.9 C)  97.9 F (36.6 C)  TempSrc:  Oral  Oral  SpO2: 91% (!) 86% 94% 90%  Weight:      Height:        Intake/Output Summary (Last 24 hours) at 12/31/2019 1256 Last data filed at 12/31/2019 0933 Gross per 24 hour  Intake 240 ml  Output --  Net 240 ml   Weight change:  Exam:   General:  Pt is alert, follows  commands appropriately, not in acute  distress  HEENT: No icterus, No thrush, No neck mass, [place]/AT  Cardiovascular: RRR, S1/S2, no rubs, no gallops  Respiratory: bilateral crackles. No wheeze  Abdomen: Soft/+BS, non tender, non distended, no guarding  Extremities: trace nonpitting edema, No lymphangitis, No petechiae, No rashes, no synovitis   Data Reviewed: I have personally reviewed following labs and imaging studies Basic Metabolic Panel: Recent Labs  Lab 12/27/19 1211 12/28/19 0749 12/29/19 0709 12/30/19 0553 12/31/19 0504  NA 137 139 138 139 137  K 4.0 4.4 4.8 4.5 4.5  CL 96* 105 103 105 105  CO2 26 23 24 25 23   GLUCOSE 169* 226* 204* 211* 287*  BUN 31* 30* 31* 25* 21  CREATININE 0.92 0.82 0.74 0.76 0.62  CALCIUM 9.6 9.0 9.3 8.9 8.6*  MG  --  2.1 2.0 2.0 2.1   Liver Function Tests: Recent Labs  Lab 12/27/19 1211 12/28/19 0749 12/29/19 0709 12/30/19 0553 12/31/19 0504  AST 48* 34 30 28 26   ALT 35 28 25 22 23   ALKPHOS 57 48 49 51 52  BILITOT 0.7 0.6 0.6 0.6 0.7  PROT 7.0 5.9* 5.7* 5.6* 5.2*  ALBUMIN 3.8 3.1* 3.0* 2.9* 2.9*   No results for input(s): LIPASE, AMYLASE in the last 168 hours. No results for input(s): AMMONIA in the last 168 hours. Coagulation Profile: No results for input(s): INR, PROTIME in the last 168 hours. CBC: Recent Labs  Lab 12/27/19 1211 12/28/19 0749 12/29/19 0709 12/30/19 0553 12/31/19 0504  WBC 6.0 3.0* 4.4 4.4 4.8  NEUTROABS 4.1 1.6* 2.7 2.9 3.4  HGB 17.5* 15.4* 14.8 15.3* 14.9  HCT 52.6* 48.3* 45.7 47.0* 45.7  MCV 87.8 90.4 88.9 89.0 89.1  PLT 154 144* 199 216 247   Cardiac Enzymes: No results for input(s): CKTOTAL, CKMB, CKMBINDEX, TROPONINI in the last 168 hours. BNP: Invalid input(s): POCBNP CBG: No results for input(s): GLUCAP in the last 168 hours. HbA1C: Recent Labs    12/29/19 0709  HGBA1C 6.4*   Urine analysis:    Component Value Date/Time   COLORURINE YELLOW 10/04/2017 1544   APPEARANCEUR HAZY (A)  10/04/2017 1544   LABSPEC 1.004 (L) 10/04/2017 1544   PHURINE 7.0 10/04/2017 1544   GLUCOSEU NEGATIVE 10/04/2017 1544   HGBUR NEGATIVE 10/04/2017 1544   BILIRUBINUR NEGATIVE 10/04/2017 1544   KETONESUR NEGATIVE 10/04/2017 1544   PROTEINUR NEGATIVE 10/04/2017 1544   UROBILINOGEN 0.2 08/20/2015 1335   NITRITE POSITIVE (A) 10/04/2017 1544   LEUKOCYTESUR LARGE (A) 10/04/2017 1544   Sepsis Labs: @LABRCNTIP (procalcitonin:4,lacticidven:4) ) Recent Results (from the past 240 hour(s))  Blood Culture (routine x 2)     Status: None (Preliminary result)   Collection Time: 12/27/19 12:27 PM   Specimen: BLOOD  Result Value Ref Range Status   Specimen Description BLOOD RIGHT ANTECUBITAL  Final   Special Requests   Final    BOTTLES DRAWN AEROBIC AND ANAEROBIC Blood Culture adequate volume   Culture   Final    NO GROWTH 4 DAYS Performed at [facility], [address] [place], [place] [address]    Report Status PENDING  Incomplete  Blood Culture (routine x 2)     Status: None (Preliminary result)   Collection Time: 12/27/19 12:28 PM   Specimen: BLOOD  Result Value Ref Range Status   Specimen Description BLOOD LEFT ANTECUBITAL  Final   Special Requests   Final    BOTTLES DRAWN AEROBIC AND ANAEROBIC Blood Culture adequate volume   Culture   Final    NO GROWTH  4 DAYS Performed at [facility], [address], [place], [place] [address]    Report Status PENDING  Incomplete     Scheduled Meds: . ALPRAZolam  0.25 mg Oral BID  . vitamin C  500 mg Oral BID  . busPIRone  15 mg Oral BID  . dexamethasone (DECADRON) injection  6 mg Intravenous Q24H  . enoxaparin (LOVENOX) injection  40 mg Subcutaneous Q24H  . escitalopram  20 mg Oral q morning - 10a  . furosemide  20 mg Oral QODAY  . Ipratropium-Albuterol  1 puff Inhalation Q6H  . isosorbide mononitrate  60 mg Oral Daily  . mirtazapine  7.5 mg Oral QHS  . pantoprazole  40 mg Oral Daily  . polyethylene glycol  17 g Oral BID  . pravastatin  40  mg Oral Daily  . senna  2 tablet Oral Daily  . sodium chloride flush  3 mL Intravenous Q12H  . umeclidinium bromide  1 puff Inhalation Daily  . zinc sulfate  220 mg Oral Daily   Continuous Infusions: . sodium chloride      Procedures/Studies: CT ANGIO CHEST PE W OR WO CONTRAST  Result Date: 12/29/2019 CLINICAL DATA:  Short of breath. COVID pneumonia. Elevated D-dimer and hypoxia. EXAM: CT ANGIOGRAPHY CHEST WITH CONTRAST TECHNIQUE: Multidetector CT imaging of the chest was performed using the standard protocol during bolus administration of intravenous contrast. Multiplanar CT image reconstructions and MIPs were obtained to evaluate the vascular anatomy. CONTRAST:  153mL OMNIPAQUE IOHEXOL 350 MG/ML SOLN COMPARISON:  08/23/2017 FINDINGS: Cardiovascular: The main pulmonary artery and its branches are patent. There is no saddle embolus or central obstructing pulmonary artery embolus. No suspicious filling defects within the lobar or segmental pulmonary arteries. Mediastinum/Nodes: 1.9 cm low-density nodule in left lobe of thyroid gland noted. Right lobe of thyroid gland unremarkable. The trachea appears patent and is midline. Small hiatal hernia. Esophagus otherwise unremarkable. No thoracic adenopathy identified. Lungs/Pleura: Extensive, multifocal bilateral areas of ground-glass attenuation and interstitial thickening noted throughout both lungs compatible with multifocal atypical viral pneumonia. No pleural effusion or pneumothorax. No atelectasis. Upper Abdomen: Liver cysts are again identified. No acute abnormality identified. Musculoskeletal: Spondylosis within the thoracic spine. Moderate degenerative changes noted within both glenohumeral joints. Review of the MIP images confirms the above findings. IMPRESSION: 1. No evidence for acute pulmonary embolus. 2. Multifocal bilateral areas of ground-glass attenuation and interstitial thickening compatible with multifocal atypical viral pneumonia. 3. Aortic  atherosclerosis. 4. Left lobe of thyroid gland nodule. Recommend nonemergent thyroid US(ref: J Am Coll Radiol. 2015 Feb;12(2): 143-50). Aortic Atherosclerosis (ICD10-I70.0). Electronically Signed   By: Kerby Moors M.D.   On: 12/29/2019 16:12   DG CHEST PORT 1 VIEW  Result Date: 12/27/2019 CLINICAL DATA:  Increased oxygen demand. COVID positive. EXAM: PORTABLE CHEST 1 VIEW COMPARISON:  Radiograph earlier this day at 12:11 hour FINDINGS: Similar patchy bilateral lung opacities, basilar predominant. No significant change from earlier today. Unchanged heart size and mediastinal contours. No pneumothorax or large pleural effusion. IMPRESSION: Patchy bilateral lung opacities consistent with COVID-19 pneumonia, unchanged from earlier this day. Electronically Signed   By: Keith Rake M.D.   On: 12/27/2019 23:45   DG Chest Port 1 View  Result Date: 12/27/2019 CLINICAL DATA:  COVID exposure. Shortness of breath, fever and headache. EXAM: PORTABLE CHEST 1 VIEW COMPARISON:  12/27/2016 FINDINGS: Heart size normal. No pleural effusion or edema. Bilateral lower lung zone interstitial opacities are identified, new from previous exam. The visualized osseous structures appear intact.  IMPRESSION: 1. Bilateral lower lung zone interstitial opacities. Imaging findings compatible with an inflammatory or infectious process. Atypical viral pneumonia not excluded. Electronically Signed   By: Kerby Moors M.D.   On: 12/27/2019 12:26    Orson Eva, DO  [facility] Pager [phone]  If 7PM-7AM, please contact night-coverage [email or web address] Password [email or web address] 12/31/2019, 12:56 PM   LOS: 4 days

## 2019-12-31 NOTE — Consult Note (Signed)
NAME:  Vickie [name], MRN:  [id number], DOB:  [birth year], LOS: 4 ADMISSION DATE:  12/27/2019, CONSULTATION DATE:  12/30/2018 REFERRING MD:  Dr. Carles Collet, [facility], CHIEF COMPLAINT:  Short of breath   Brief History   74 yo female former smoker presented to ER with dyspnea after exposure to [place].  Found to have hypoxia with b/l pulmonary infiltrates and COVID 19 positive.  Noted to have progressive hypoxia and PCCM asked to assess.  History of present illness   She was exposed to several family members during Christmas that later tested positive for [place].  She had shortness of breath for 2 days prior to admission.  Not having cough, wheeze, chest pain, GI symptoms, no loss of taste or smell.  Started on decadron and remdesivir.  Noted to have increasing O2 needs and PCCM asked to assess.  Past Medical History  CAD, COPD, HTN, HLD, OA, GERD  Significant Hospital Events   1/01 Admit  Consults:    Procedures:    Significant Diagnostic Tests:  CT angio chest 1/03 >> b/l GGO and interstitial thickening b/l  Micro Data:  SARS CoV2 1/01 >> positive  COVID treatment  Decadron 1/01 >> Remdesivir 1/01 > 1/05  Interim history/subjective:    Objective   Blood pressure (!) 100/53, pulse 62, temperature 97.9 F (36.6 C), temperature source Oral, resp. rate 18, height 4\' 11"  (1.499 m), weight 81.8 kg, SpO2 90 %.        Intake/Output Summary (Last 24 hours) at 12/31/2019 1200 Last data filed at 12/31/2019 [id number] Gross per 24 hour  Intake 240 ml  Output -  Net 240 ml   Filed Weights   12/27/19 1714  Weight: 81.8 kg    Examination:  General - alert, no accessory muscle use, speaking in full sentences Eyes - pupils reactive ENT - no sinus tenderness, no stridor Cardiac - regular rate/rhythm, no murmur Chest - scattered rhonchi, no wheeze Abdomen - soft, non tender, + bowel sounds Extremities - lose of digits on hands b/l Skin - no rashes Neuro - normal strength, moves extremities,  follows commands Lymphatics - no lymphadenopathy Psych - normal mood and behavior   Resolved Hospital Problem list     Assessment & Plan:   Acute hypoxic respiratory failure from COVID 19 pneumonia. - she does not have increased work of breathing  - she is DNI - continue supplemental oxygen to keep SpO2 85 to 95% - day 5/10 of decadron - completed remdesivir - CRP in normal range >> defer tocilizumab  - prone positioning as tolerated - bronchial hygiene - mobilize as able - continue vit C, zinc  Hx of COPD. - continue incruse - prn albuterol  Hx of CAD, HLD, HTN. - lasix, imdur, pravachol  Hx of anxiety, depression. - xanax, buspar, remeron   Best practice:  Diet: heart healthy DVT prophylaxis: lovenox GI prophylaxis: not indicated Mobility: OOB to chair Code Status: no intubation Disposition: progressive care  Labs   CBC: Recent Labs  Lab 12/27/19 1211 12/28/19 0749 12/29/19 0709 12/30/19 0553 12/31/19 0504  WBC 6.0 3.0* 4.4 4.4 4.8  NEUTROABS 4.1 1.6* 2.7 2.9 3.4  HGB 17.5* 15.4* 14.8 15.3* 14.9  HCT 52.6* 48.3* 45.7 47.0* 45.7  MCV 87.8 90.4 88.9 89.0 89.1  PLT 154 144* 199 216 [id number]    Basic Metabolic Panel: Recent Labs  Lab 12/27/19 1211 12/28/19 0749 12/29/19 0709 12/30/19 0553 12/31/19 0504  NA 137 139 138 139 137  K 4.0 4.4 4.8  4.5 4.5  CL 96* 105 103 105 105  CO2 26 23 24 25 23   GLUCOSE 169* 226* 204* 211* 287*  BUN 31* 30* 31* 25* 21  CREATININE 0.92 0.82 0.74 0.76 0.62  CALCIUM 9.6 9.0 9.3 8.9 8.6*  MG  --  2.1 2.0 2.0 2.1   GFR: Estimated Creatinine Clearance: 58.8 mL/min (by C-G formula based on SCr of 0.62 mg/dL). Recent Labs  Lab 12/27/19 1211 12/27/19 1415 12/28/19 0749 12/29/19 0709 12/30/19 0553 12/31/19 0504  PROCALCITON <0.10  --   --   --   --   --   WBC 6.0  --  3.0* 4.4 4.4 4.8  LATICACIDVEN 2.0* 3.7*  --   --   --   --     Liver Function Tests: Recent Labs  Lab 12/27/19 1211 12/28/19 0749 12/29/19  0709 12/30/19 0553 12/31/19 0504  AST 48* 34 30 28 26   ALT 35 28 25 22 23   ALKPHOS 57 48 49 51 52  BILITOT 0.7 0.6 0.6 0.6 0.7  PROT 7.0 5.9* 5.7* 5.6* 5.2*  ALBUMIN 3.8 3.1* 3.0* 2.9* 2.9*   No results for input(s): LIPASE, AMYLASE in the last 168 hours. No results for input(s): AMMONIA in the last 168 hours.  ABG    Component Value Date/Time   PHART 7.379 11/19/2007 1300   PCO2ART 44.1 11/19/2007 1300   PO2ART 75.7 (L) 11/19/2007 1300   HCO3 25.5 (H) 11/19/2007 1300   TCO2 23.0 11/19/2007 1300   O2SAT 95.0 11/19/2007 1300     Coagulation Profile: No results for input(s): INR, PROTIME in the last 168 hours.  Cardiac Enzymes: No results for input(s): CKTOTAL, CKMB, CKMBINDEX, TROPONINI in the last 168 hours.  HbA1C: Hgb A1c MFr Bld  Date/Time Value Ref Range Status  12/29/2019 07:09 AM 6.4 (H) 4.8 - 5.6 % Final    Comment:    (NOTE) Pre diabetes:          5.7%-6.4% Diabetes:              >6.4% Glycemic control for   <7.0% adults with diabetes   01/30/2010 04:18 PM  4.6 - 6.1 % Final   5.9 (NOTE) The ADA recommends the following therapeutic goal for glycemic control related to Hgb A1c measurement: Goal of therapy: <6.5 Hgb A1c  Reference: American Diabetes Association: Clinical Practice Recommendations 2010, Diabetes Care, 2010, 33: (Suppl  1).    CBG: No results for input(s): GLUCAP in the last 168 hours.  Review of Systems:   Reviewed and negative  Past Medical History  She,  has a past medical history of Anginal pain ([place]), Anxiety, Anxiety disorder, Arthritis, CAD S/P percutaneous coronary angioplasty (08/2017), Chronic back pain, COPD (chronic obstructive pulmonary disease) ([place]), Depression, Dyspnea, Dysrhythmia, Essential hypertension, GERD (gastroesophageal reflux disease), Hiatal hernia, History of left bundle branch block (LBBB) (08/2017), Hyperlipidemia, Macular degeneration, Myocardial infarction ([place]) (2007), Nonocclusive coronary atherosclerosis of  native coronary artery (2006 through 2012), OSA (obstructive sleep apnea) (9/25/ 2012), OSA on CPAP, Pneumonia, Scoliosis, and Type II diabetes mellitus ([place]).   Surgical History    Past Surgical History:  Procedure Laterality Date  . CARDIAC CATHETERIZATION  [id number]; 2009   Non-occlusice CAD - only 80% ostial SP1;  NON DOMINANNT  RCA (catheter insuced dissection with MI in 2007 --> resolved by 2009 cath)  . CATARACT EXTRACTION, BILATERAL Bilateral 2017   Toric lens "in the left eye only"  . COLONOSCOPY WITH PROPOFOL N/A 11/09/2018   Procedure: COLONOSCOPY  WITH PROPOFOL;  Surgeon: Rogene Houston, MD;  [facility];  Service: Endoscopy;  Laterality: N/A;  2:25  . CORONARY STENT INTERVENTION N/A 09/18/2017   Procedure: CORONARY STENT INTERVENTION;  Surgeon: Leonie Man, MD;  [facility] INVASIVE CV LAB: DES PCI Diag2: Synergy DES 2.25 mm x 12 mm (2.4 mm)  . DIAGNOSTIC LAPAROSCOPY Right   . DILATION AND CURETTAGE OF UTERUS    . EYE SURGERY    . INTRAVASCULAR PRESSURE WIRE/FFR STUDY N/A 09/18/2017   Procedure: INTRAVASCULAR PRESSURE WIRE/FFR STUDY;  Surgeon: Leonie Man, MD;  [facility];  Service: Cardiovascular: FFR Diag 2 -- 0.78 (significcant) --> PCI  . KNEE ARTHROSCOPY Right   . LEFT HEART CATH AND CORONARY ANGIOGRAPHY N/A 09/18/2017   Procedure: LEFT HEART CATH AND CORONARY ANGIOGRAPHY;  Surgeon: Leonie Man, MD;  Location: [facility] INVASIVE CV LAB: Culpril - 80% Diag2 (FFR 0.78)- PCI.  pLAD 40%, pCx 40%. Post PCI LBBB. LVEDP - ~20 mmHg. EF 55-60%  . LIPOMA EXCISION Right ~ 2015   anterior abdomen  . PARS PLANA VITRECTOMY W/ REPAIR OF MACULAR HOLE Right    Unsuccessful repair.  Hole filled  . POLYPECTOMY  11/09/2018   Procedure: POLYPECTOMY;  Surgeon: Rogene Houston, MD;  [facility];  Service: Endoscopy;;  colon  . VAGINAL HYSTERECTOMY       Social History   reports that she quit smoking about 43 years ago. Her smoking use  included cigarettes. She has a 25.50 pack-year smoking history. She has never used smokeless tobacco. She reports that she does not drink alcohol or use drugs.   Family History   Her family history includes Breast cancer in her maternal aunt and paternal aunt; CAD in her mother.   Allergies Allergies  Allergen Reactions  . Adhesive [Tape] Other (See Comments)    REACTION: redness/irritation at application site. **Certain bandages/adhesives cause this reaction**  . Avelox [Moxifloxacin] Anaphylaxis  . Bactrim [Sulfamethoxazole-Trimethoprim] Anaphylaxis, Shortness Of Breath, Rash and Other (See Comments)    REACTION: Choking, inability to swallow, redness  . Levaquin [Levofloxacin] Anaphylaxis, Shortness Of Breath, Rash and Other (See Comments)    Reaction:Choking Brand name Levaquin ok per pt  . Mucinex [Guaifenesin Er] Anaphylaxis, Shortness Of Breath and Rash  . Penicillins Other (See Comments)    "Passed out" Has patient had a PCN reaction causing immediate rash, facial/tongue/throat swelling, SOB or lightheadedness with hypotension: Unknown Has patient had a PCN reaction causing severe rash involving mucus membranes or skin necrosis: No Has patient had a PCN reaction that required hospitalization: No Has patient had a PCN reaction occurring within the last 10 years: No If all of the above answers are "NO", then may proceed with Cephalosporin use.   [place] [Pseudoephedrine Hcl] Shortness Of Breath, Rash and Other (See Comments)    REACTION: Choking, redness, inability to swallow  . Crestor [Rosuvastatin] Other (See Comments)    Leg pain  . Lipitor [Atorvastatin] Other (See Comments)    Leg pain  . Vytorin [Ezetimibe-Simvastatin] Other (See Comments)    Leg pain  . Norvasc [Amlodipine] Other (See Comments)    unknown     Home Medications  Prior to Admission medications   Medication Sig Start Date End Date Taking? Authorizing Provider  albuterol (PROAIR HFA) 108 (90 BASE)  MCG/ACT inhaler Inhale 1 puff into the lungs every 6 (six) hours as needed for wheezing or shortness of breath.   Yes [provider]  ALPRAZolam (XANAX) 0.5 MG tablet Take 0.25 mg by mouth 2 (two) times daily.    Yes [provider]  Biotin w/ Vitamins C & E (HAIR/SKIN/NAILS PO) Take 1 tablet by mouth at bedtime.   Yes [provider]  busPIRone (BUSPAR) 15 MG tablet Take 15 mg by mouth 2 (two) times daily.   Yes [provider]  Calcium Carb-Cholecalciferol (CALCIUM 600 + D PO) Take 1 tablet by mouth daily.    Yes [provider]  Coenzyme Q10 (COQ10) 100 MG CAPS Take 100 mg by mouth every morning.   Yes [provider]  CRANBERRY PO Take 1 capsule by mouth daily.    Yes [provider]  doxycycline (VIBRAMYCIN) 100 MG capsule Take 100 mg by mouth 2 (two) times daily. 12/26/19  Yes [provider]  escitalopram (LEXAPRO) 20 MG tablet Take 20 mg by mouth every morning.    Yes [provider]  furosemide (LASIX) 20 MG tablet TAKE ONE TABLET EVERY OTHER DAY. 12/23/19  Yes Leonie Man, MD  isosorbide mononitrate (IMDUR) 60 MG 24 hr tablet TAKE ONE TABLET BY MOUTH ONCE DAILY. 11/26/19  Yes Leonie Man, MD  labetalol (NORMODYNE) 100 MG tablet TAKE 1 TABLET BY MOUTH TWICE DAILY. 05/02/19  Yes Almyra Deforest, PA  mirtazapine (REMERON) 15 MG tablet Take 7.5 mg by mouth at bedtime.    Yes [provider]  Multiple Vitamin (MULTIVITAMIN) tablet Take 1 tablet by mouth at bedtime.    Yes [provider]  nitroGLYCERIN (NITROSTAT) 0.4 MG SL tablet PLACE 1 TAB UNDER TONGUE EVERY 5 MIN IF NEEDED FOR CHEST PAIN. MAY USE 3 TIMES.NO RELIEF CALL 911. 01/03/19  Yes Leonie Man, MD  Omega-3 Fatty Acids (FISH OIL PO) Take 1 capsule by mouth at bedtime.   Yes [provider]  pantoprazole (PROTONIX) 40 MG tablet TAKE ONE TABLET BY MOUTH ONCE DAILY. 05/23/19  Yes Leonie Man, MD  potassium chloride SA  (KLOR-CON) 20 MEQ tablet TAKE 1 TABLET EVERY OTHER DAY, TAKE ON DAYS THAT YOU TAKE FUROSEMIDE. 12/23/19  Yes Leonie Man, MD  pravastatin (PRAVACHOL) 40 MG tablet Take 1 tablet (40 mg total) by mouth daily. 12/16/19  Yes Leonie Man, MD  Tiotropium Bromide Monohydrate (SPIRIVA RESPIMAT) 2.5 MCG/ACT AERS Inhale 1 puff into the lungs 2 (two) times daily.    Yes [provider]  AMBULATORY NON FORMULARY MEDICATION Inject 300 mg into the skin as directed. Medication Name:Inclisiran sodium 300 mg vs placebo Patient not taking: Reported on 12/28/2019 02/22/17   Hillary Bow, MD    D/w Dr. Carles Collet.    Chesley Mires, MD [facility] Pulmonary/Critical Care 12/31/2019, 12:19 PM

## 2019-12-31 NOTE — Consult Note (Signed)
Consultation Note Date: 12/31/2019   Patient Name: Vickie [name]  DOB: [birth year]  MRN: [id number]  Age / Sex: 74 y.o., female  PCP: Sharilyn Sites, MD Referring Physician: Orson Eva, MD  Reason for Consultation: Establishing goals of care and Psychosocial/spiritual support  HPI/Patient Profile: 74 y.o. female  with past medical history of COPD, CAD status post coronary angioplasty September 2018, anxiety and depression, arthritis, chronic back pain, left bundle branch block, HTN/HLD, MI 2007, GERD admitted on 12/27/2019 with acute respiratory failure with hypoxia secondary to COVID-19 pneumonia.   Clinical Assessment and Goals of Care: I have reviewed medical records including EPIC notes, labs and imaging, received report from bedside nursing staff.   Per nursing staff Vickie [name] is able to make her needs known, she sits quietly and is able to follow direction.  She looks relatively normal per nursing staff, but O2 saturation remains in the 80s.    Unfortunately her husband is also hospitalized with Covid at this time, so I am unable to reach out to him.Marland Kitchen  Phone call to patient to discuss diagnosis prognosis, GOC, EOL wishes, disposition and options, no answer.  Code status today as this has been discussed with attending today.    PMT to continue to follow.   HCPOA    NEXT OF KIN -spouse, Vickie [name].    SUMMARY OF RECOMMENDATIONS   Partial code, no intubation  Continue to treat the treatable.   Code Status/Advance Care Planning:  Limited code -DO NOT INTUBATE  Symptom Management:   Per hospitalist, no additional needs at this time.  Palliative Prophylaxis:   Oral Care and Turn Reposition  Additional Recommendations (Limitations, Scope, Preferences):  Continue to treat the treatable, no intubation  Psycho-social/Spiritual:   Desire for further Chaplaincy support:no  Additional  Recommendations: Caregiving  Support/Resources  Prognosis:   Unable to determine, based on outcomes.  Guarded  Discharge Planning: To be determined, based on outcomes.      Primary Diagnoses: Present on Admission: . Acute hypoxemic respiratory failure due to COVID-19 ([place]) . Obesity (BMI 30-39.9) . Essential hypertension   I have reviewed the medical record, interviewed the patient and family, and examined the patient. The following aspects are pertinent.  Past Medical History:  Diagnosis Date  . Anginal pain ([place])   . Anxiety   . Anxiety disorder    With apparent panic attacks  . Arthritis    "hands, knees, back" (09/18/2017)  . CAD S/P percutaneous coronary angioplasty 08/2017   Single-vessel CAD involving second diagonal branch treated with DES stent Synergy 2.25 mm x 12 mm (2.4 mm)  . Chronic back pain    "all over" (09/18/2017)  . COPD (chronic obstructive pulmonary disease) ([place])    PFTs were done in 2008 at [facility]  . Depression   . Dyspnea   . Dysrhythmia    LBBB  . Essential hypertension   . GERD (gastroesophageal reflux disease)   . Hiatal hernia   . History of left bundle branch block (LBBB) 08/2017  Rate Related LBBB noted in cath lab  . Hyperlipidemia   . Macular degeneration    right eye  . Myocardial infarction [facility]) 2007   "medically induced"  . Nonocclusive coronary atherosclerosis of native coronary artery 2006 through 2012   multi caths 2012, 2006, [id number] ([id number] complicated by catheter-induced dissection of small nondominant RCA, patent in 2009 with no residual abnormality); Myoview August 2013: LOW RISK, normal EF. ; Echocardiogram Vickie [name] - January 2014) moderate LVH, EF 55-65%. No significant valvular disease.  . OSA (obstructive sleep apnea) 9/25/ 2012   tested 2009; tetested sleep study 07/2011--titration 09/20/2011 now use Bi-PAP  . OSA on CPAP   . Pneumonia    "several times in 2017; 3 times already this year" (09/18/2017)  .  Scoliosis   . Type II diabetes mellitus ([place])    Social History   Socioeconomic History  . Marital status: Married    Spouse name: Not on file  . Number of children: Not on file  . Years of education: Not on file  . Highest education level: Not on file  Occupational History  . Not on file  Tobacco Use  . Smoking status: Former Smoker    Packs/day: 3.00    Years: 8.50    Pack years: 25.50    Types: Cigarettes    Quit date: 04/18/1976    Years since quitting: 43.7  . Smokeless tobacco: Never Used  Substance and Sexual Activity  . Alcohol use: No  . Drug use: No  . Sexual activity: Not Currently    Birth control/protection: None  Other Topics Concern  . Not on file  Social History Narrative   Married mother of 57, grandmother of 57. Her mother is 64 years old. Quit smoking 34 years ago. Does not drink alcohol.  Retired from [employer] in 2012.   Usually presents with oldest daughter.   Previously worked out at [employer] regularly walking 1/2-1 mile a day, but no longer able to do so because of the [employer] decision to no longer cover [employer] cost.   Social Determinants of Health   Financial Resource Strain:   . Difficulty of Paying Living Expenses: Not on file  Food Insecurity:   . Worried About Charity fundraiser in the Last Year: Not on file  . Ran Out of Food in the Last Year: Not on file  Transportation Needs:   . Lack of Transportation (Medical): Not on file  . Lack of Transportation (Non-Medical): Not on file  Physical Activity:   . Days of Exercise per Week: Not on file  . Minutes of Exercise per Session: Not on file  Stress:   . Feeling of Stress : Not on file  Social Connections:   . Frequency of Communication with Friends and Family: Not on file  . Frequency of Social Gatherings with Friends and Family: Not on file  . Attends Religious Services: Not on file  . Active Member of Clubs or Organizations: Not on file  . Attends  Archivist Meetings: Not on file  . Marital Status: Not on file   Family History  Problem Relation Age of Onset  . CAD Mother   . Breast cancer Maternal Aunt   . Breast cancer Paternal Aunt    Scheduled Meds: . ALPRAZolam  0.25 mg Oral BID  . vitamin C  500 mg Oral BID  . busPIRone  15 mg Oral BID  . dexamethasone (DECADRON) injection  6 mg Intravenous Q24H  . enoxaparin (LOVENOX) injection  40 mg Subcutaneous Q24H  . escitalopram  20 mg Oral q morning - 10a  . furosemide  20 mg Oral QODAY  . Ipratropium-Albuterol  1 puff Inhalation Q6H  . isosorbide mononitrate  60 mg Oral Daily  . mirtazapine  7.5 mg Oral QHS  . pantoprazole  40 mg Oral Daily  . polyethylene glycol  17 g Oral BID  . pravastatin  40 mg Oral Daily  . senna  2 tablet Oral Daily  . sodium chloride flush  3 mL Intravenous Q12H  . umeclidinium bromide  1 puff Inhalation Daily  . zinc sulfate  220 mg Oral Daily   Continuous Infusions: . sodium chloride     PRN Meds:.sodium chloride, acetaminophen, albuterol, chlorpheniramine-HYDROcodone, nitroGLYCERIN, ondansetron **OR** ondansetron (ZOFRAN) IV, phenol, polyvinyl alcohol, sodium chloride flush Medications Prior to Admission:  Prior to Admission medications   Medication Sig Start Date End Date Taking? Authorizing Provider  albuterol (PROAIR HFA) 108 (90 BASE) MCG/ACT inhaler Inhale 1 puff into the lungs every 6 (six) hours as needed for wheezing or shortness of breath.   Yes [provider]  ALPRAZolam Duanne Moron) 0.5 MG tablet Take 0.25 mg by mouth 2 (two) times daily.    Yes [provider]  Biotin w/ Vitamins C & E (HAIR/SKIN/NAILS PO) Take 1 tablet by mouth at bedtime.   Yes [provider]  busPIRone (BUSPAR) 15 MG tablet Take 15 mg by mouth 2 (two) times daily.   Yes [provider]  Calcium Carb-Cholecalciferol (CALCIUM 600 + D PO) Take 1 tablet by mouth daily.    Yes [provider]  Coenzyme Q10 (COQ10) 100  MG CAPS Take 100 mg by mouth every morning.   Yes [provider]  CRANBERRY PO Take 1 capsule by mouth daily.    Yes [provider]  doxycycline (VIBRAMYCIN) 100 MG capsule Take 100 mg by mouth 2 (two) times daily. 12/26/19  Yes [provider]  escitalopram (LEXAPRO) 20 MG tablet Take 20 mg by mouth every morning.    Yes [provider]  furosemide (LASIX) 20 MG tablet TAKE ONE TABLET EVERY OTHER DAY. 12/23/19  Yes Leonie Man, MD  isosorbide mononitrate (IMDUR) 60 MG 24 hr tablet TAKE ONE TABLET BY MOUTH ONCE DAILY. 11/26/19  Yes Leonie Man, MD  labetalol (NORMODYNE) 100 MG tablet TAKE 1 TABLET BY MOUTH TWICE DAILY. 05/02/19  Yes Almyra Deforest, PA  mirtazapine (REMERON) 15 MG tablet Take 7.5 mg by mouth at bedtime.    Yes [provider]  Multiple Vitamin (MULTIVITAMIN) tablet Take 1 tablet by mouth at bedtime.    Yes [provider]  nitroGLYCERIN (NITROSTAT) 0.4 MG SL tablet PLACE 1 TAB UNDER TONGUE EVERY 5 MIN IF NEEDED FOR CHEST PAIN. MAY USE 3 TIMES.NO RELIEF CALL 911. 01/03/19  Yes Leonie Man, MD  Omega-3 Fatty Acids (FISH OIL PO) Take 1 capsule by mouth at bedtime.   Yes [provider]  pantoprazole (PROTONIX) 40 MG tablet TAKE ONE TABLET BY MOUTH ONCE DAILY. 05/23/19  Yes Leonie Man, MD  potassium chloride SA (KLOR-CON) 20 MEQ tablet TAKE 1 TABLET EVERY OTHER DAY, TAKE ON DAYS THAT YOU TAKE FUROSEMIDE. 12/23/19  Yes Leonie Man, MD  pravastatin (PRAVACHOL) 40 MG tablet Take 1 tablet (40 mg total) by mouth daily. 12/16/19  Yes Leonie Man, MD  Tiotropium Bromide Monohydrate (SPIRIVA RESPIMAT) 2.5 MCG/ACT AERS Inhale 1  puff into the lungs 2 (two) times daily.    Yes [provider]  AMBULATORY NON FORMULARY MEDICATION Inject 300 mg into the skin as directed. Medication Name:Inclisiran sodium 300 mg vs placebo Patient not taking: Reported on 12/28/2019 02/22/17   Hillary Bow, MD   Allergies    Allergen Reactions  . Adhesive [Tape] Other (See Comments)    REACTION: redness/irritation at application site. **Certain bandages/adhesives cause this reaction**  . Avelox [Moxifloxacin] Anaphylaxis  . Bactrim [Sulfamethoxazole-Trimethoprim] Anaphylaxis, Shortness Of Breath, Rash and Other (See Comments)    REACTION: Choking, inability to swallow, redness  . Levaquin [Levofloxacin] Anaphylaxis, Shortness Of Breath, Rash and Other (See Comments)    Reaction:Choking Brand name Levaquin ok per pt  . Mucinex [Guaifenesin Er] Anaphylaxis, Shortness Of Breath and Rash  . Penicillins Other (See Comments)    "Passed out" Has patient had a PCN reaction causing immediate rash, facial/tongue/throat swelling, SOB or lightheadedness with hypotension: Unknown Has patient had a PCN reaction causing severe rash involving mucus membranes or skin necrosis: No Has patient had a PCN reaction that required hospitalization: No Has patient had a PCN reaction occurring within the last 10 years: No If all of the above answers are "NO", then may proceed with Cephalosporin use.   [place] [Pseudoephedrine Hcl] Shortness Of Breath, Rash and Other (See Comments)    REACTION: Choking, redness, inability to swallow  . Crestor [Rosuvastatin] Other (See Comments)    Leg pain  . Lipitor [Atorvastatin] Other (See Comments)    Leg pain  . Vytorin [Ezetimibe-Simvastatin] Other (See Comments)    Leg pain  . Norvasc [Amlodipine] Other (See Comments)    unknown   Review of Systems  Unable to perform ROS: Acuity of condition    Physical Exam Vitals and nursing note reviewed.     Vital Signs: BP 140/62 (BP Location: Left Arm)   Pulse 82   Temp 98.1 F (36.7 C) (Oral)   Resp 16   Ht 4\' 11"  (1.499 m)   Wt 81.8 kg   SpO2 (!) 87%   BMI 36.42 kg/m  Pain Scale: 0-10   Pain Score: 0-No pain   SpO2: SpO2: (!) 87 % O2 Device:SpO2: (!) 87 % O2 Flow Rate: .O2 Flow Rate (L/min): 6 L/min  IO: Intake/output  summary:   Intake/Output Summary (Last 24 hours) at 12/31/2019 1618 Last data filed at 12/31/2019 1438 Gross per 24 hour  Intake 480 ml  Output --  Net 480 ml    LBM: Last BM Date: 12/30/19 Baseline Weight: Weight: 81.8 kg Most recent weight: Weight: 81.8 kg     Palliative Assessment/Data:   Flowsheet Rows     Most Recent Value  Intake Tab  Referral Department  Hospitalist  Unit at Time of Referral  Cardiac/Telemetry Unit  Palliative Care Primary Diagnosis  Pulmonary  Date Notified  12/31/19  Palliative Care Type  New Palliative care  Reason for referral  Clarify Goals of Care  Date of Admission  12/27/19  Date first seen by Palliative Care  12/31/19  # of days Palliative referral response time  0 Day(s)  # of days IP prior to Palliative referral  4  Clinical Assessment  Palliative Performance Scale Score  30%  Pain Max last 24 hours  Not able to report  Pain Min Last 24 hours  Not able to report  Dyspnea Max Last 24 Hours  Not able to report  Dyspnea Min Last 24 hours  Not able to  report  Psychosocial & Spiritual Assessment  Palliative Care Outcomes      Time In: 1610 Time Out: 1640 Time Total: 30 minutes   Greater than 50%  of this time was spent counseling and coordinating care related to the above assessment and plan.  Signed by: Drue Novel, NP   Please contact Palliative Medicine Team phone at [phone] for questions and concerns.  For individual provider: See Shea Evans

## 2019-12-31 NOTE — Care Management Important Message (Signed)
Important Message  Patient Details  Name: Vickie [name] MRN: [id number] Date of Birth: [birth year]   Medicare Important Message Given:  Yes(Patricia, RN agreed to deliver to patient due to contact precautions)     Tommy Medal 12/31/2019, 2:50 PM

## 2020-01-01 LAB — CBC WITH DIFFERENTIAL/PLATELET
Abs Immature Granulocytes: 0.14 10*3/uL — ABNORMAL HIGH (ref 0.00–0.07)
Basophils Absolute: 0 10*3/uL (ref 0.0–0.1)
Basophils Relative: 0 %
Eosinophils Absolute: 0 10*3/uL (ref 0.0–0.5)
Eosinophils Relative: 0 %
HCT: 45.4 % (ref 36.0–46.0)
Hemoglobin: 15.1 g/dL — ABNORMAL HIGH (ref 12.0–15.0)
Immature Granulocytes: 2 %
Lymphocytes Relative: 16 %
Lymphs Abs: 1.2 10*3/uL (ref 0.7–4.0)
MCH: 29.3 pg (ref 26.0–34.0)
MCHC: 33.3 g/dL (ref 30.0–36.0)
MCV: 88 fL (ref 80.0–100.0)
Monocytes Absolute: 0.6 10*3/uL (ref 0.1–1.0)
Monocytes Relative: 8 %
Neutro Abs: 5.7 10*3/uL (ref 1.7–7.7)
Neutrophils Relative %: 74 %
Platelets: 292 10*3/uL (ref 150–400)
RBC: 5.16 MIL/uL — ABNORMAL HIGH (ref 3.87–5.11)
RDW: 12.5 % (ref 11.5–15.5)
WBC: 7.7 10*3/uL (ref 4.0–10.5)
nRBC: 0 % (ref 0.0–0.2)

## 2020-01-01 LAB — CULTURE, BLOOD (ROUTINE X 2)
Culture: NO GROWTH
Culture: NO GROWTH
Special Requests: ADEQUATE
Special Requests: ADEQUATE

## 2020-01-01 LAB — COMPREHENSIVE METABOLIC PANEL
ALT: 23 U/L (ref 0–44)
AST: 22 U/L (ref 15–41)
Albumin: 2.9 g/dL — ABNORMAL LOW (ref 3.5–5.0)
Alkaline Phosphatase: 64 U/L (ref 38–126)
Anion gap: 9 (ref 5–15)
BUN: 21 mg/dL (ref 8–23)
CO2: 28 mmol/L (ref 22–32)
Calcium: 9.3 mg/dL (ref 8.9–10.3)
Chloride: 102 mmol/L (ref 98–111)
Creatinine, Ser: 0.69 mg/dL (ref 0.44–1.00)
GFR calc Af Amer: >60 mL/min (ref >60)
GFR calc non Af Amer: >60 mL/min (ref >60)
Glucose, Bld: 298 mg/dL — ABNORMAL HIGH (ref 70–99)
Potassium: 5 mmol/L (ref 3.5–5.1)
Sodium: 139 mmol/L (ref 135–145)
Total Bilirubin: 0.7 mg/dL (ref 0.3–1.2)
Total Protein: 5.4 g/dL — ABNORMAL LOW (ref 6.5–8.1)

## 2020-01-01 LAB — D-DIMER, QUANTITATIVE: D-Dimer, Quant: 0.38 ug/mL-FEU (ref 0.00–0.50)

## 2020-01-01 LAB — C-REACTIVE PROTEIN: CRP: 0.7 mg/dL (ref <1.0)

## 2020-01-01 LAB — MAGNESIUM: Magnesium: 2 mg/dL (ref 1.7–2.4)

## 2020-01-01 LAB — FERRITIN: Ferritin: 413 ng/mL — ABNORMAL HIGH (ref 11–307)

## 2020-01-01 NOTE — Progress Notes (Signed)
Patient tearful and anxious this morning, stated to this nurse "I hope I get to go home today." This nurse asked if that is what her and the MD discussed and patient replied "no, just hoping." This nurse educated patient that oxygen will likely be weaned down before discharge is possible. Patient sad and discouraged, also expressed concerned for her husband (also hospitalized for COVID-19). This nurse asked patient about if patient had heard from husband today and patient replied she had not. This nurse stated she would try to get patient in contact with husband or with husband's nurse for update to ease her worries.

## 2020-01-01 NOTE — Progress Notes (Signed)
PROGRESS NOTE  Vickie [name] [id number] DOB: 1946/08/23 DOA: 12/27/2019 PCP: Sharilyn Sites, MD  Brief History: 74 year old female with a history of coronary artery disease status post PCI 2018, COPD, hypertension, hyperlipidemia, GERD presenting with 2-day history of worsening shortness of breath and coughing. The patient has had some associated generalized weakness and myalgias. She stated that her symptoms began on 12/21/2019. She has been exposed to numerous family members who have been diagnosed with COVID-19 over the holiday. Notably, she stated that she was first exposed to her great granddaughter on 12/21/2020who was tested positive for COVID-19. Upon presentation, the patient was hypoxic requiring 4 L nasal cannula. BMP and LFTs were essentially unremarkable. WBC 6.0, hemoglobin 17.5, platelets 254,000. Inflammatory markers were elevated with ferritin 637, CRP 1.2, lactic acid 2.0, D-dimer 0.92. Chest x-ray showed bilateral patchy opacities. The patient was started on IV steroids and remdesivir.  She finished remdesivir on 12/31/19, but remained significantly hypoxic.  Even though patient was less dyspneic and inflammatory markers were improving, she required 6L HFNC with saturation in mid 80s.  Pulmonary was consulted to assist.  Assessment/Plan: Acute respiratory failure with hypoxia -Secondary to COVID-19 pneumonia -Presently stable on6LHFNC-->84-85% -CRP 1.2>>>1.2>>>0.7>>>0.6>>>0.6 -Ferritin 637>>>517>>514>>568>>>467 -D-dimer 0.92>>>0.62>>0.47>>0.36>>0.77 -Fibrinogen 445 -PCT<0.10 -Lactic acid 2.0>>>3.7 -Continue IV steroids and remdesivir -CTA chest--no PE; extensive bilateral GGO and interstitial thickening -encouraged pt to lay prone for 2 hours at a time if can be tolerated -encouraged incentive spirometry -pulmonary consult appreciated   COPD -ContinueCombivent and Incruse Ellipta.  Essential hypertension -Holding labetalol secondary  to soft blood pressures and bradycardia -BP remains well controlled  Coronary artery disease -No chest pain presently -Personally reviewed EKG--sinus rhythm, nonspecific T wave changes -Continue statin -Continue Imdur  Hyperlipidemia -Continue statin and fish oil  Anxiety/depression -Continue Xanax, BuSpar, and Remeron  Thrombocytopenia -due to infectious process -improved with treatment -serial CBC  Goals of Care -Advance care planning, including the explanation and discussion of advance directives was carried out with the patient and family.  Code status including explanations of "Full Code" and "DNR" and alternatives were discussed in detail.  Discussion of end-of-life issues including but not limited palliative care, hospice care and the concept of hospice, other end-of-life care options, power of attorney for health care decisions, living wills, and physician orders for life-sustaining treatment were also discussed with the patient and family.  Total face to face time 16 minutes. -pt confirmed wishes to be DNI   Disposition Plan: Not stable for discharge, still requiring significant amount of oxygen supplementation.  Pending physical therapy evaluation.  Family Communication:NoFamily at bedside  Consultants:pulmonary  Code Status: FULL   DVT Prophylaxis: [place] Lovenox   Procedures: As Listed in Progress Note Above  Antibiotics: None  Subjective: No fever, no chills, no chest pain, no nausea vomiting.  Still requiring high flow nasal cannula supplementation about 6 L to maintain O2 sat more than 95%.  Express shortness of breath on exertion.    Objective: Vitals:   12/31/19 2021 01/01/20 0629 01/01/20 0632 01/01/20 1400  BP: 132/63 (!) 148/75  135/71  Pulse: 78 (!) 56  72  Resp: 18 20  20   Temp: 98.3 F (36.8 C) 98.4 F (36.9 C)  98.3 F (36.8 C)  TempSrc: Oral Oral  Oral  SpO2: 90% (!) 81% 92% (!) 88%  Weight:      Height:         Intake/Output Summary (Last 24 hours) at 01/01/2020 1738  Last data filed at 01/01/2020 1300 Gross per 24 hour  Intake 480 ml  Output --  Net 480 ml   Weight change:   Exam: General exam: Alert, awake, oriented x 3; patient denies chest pain, no nausea, no vomiting and expressed no shortness of breath currently.  Still requiring 6 L high flow nasal cannula supplementation to maintain O2 sats above 85%.  Patient is afebrile. Respiratory system: Decreased breath sounds at the bases, no wheezing, positive rhonchi bilaterally.  No using accessory muscles.   Cardiovascular system:RRR. No murmurs, rubs, gallops. Gastrointestinal system: Abdomen is nondistended, soft and nontender. No organomegaly or masses felt. Normal bowel sounds heard. Central nervous system: Alert and oriented. No focal neurological deficits. Extremities: No C/C/E, +pedal pulses Skin: No rashes, lesions or ulcers Psychiatry: Judgement and insight appear normal. Mood & affect appropriate.    Data Reviewed: I have personally reviewed following labs and imaging studies  Basic Metabolic Panel: Recent Labs  Lab 12/28/19 0749 12/29/19 0709 12/30/19 0553 12/31/19 0504 01/01/20 0600  NA 139 138 139 137 139  K 4.4 4.8 4.5 4.5 5.0  CL 105 103 105 105 102  CO2 23 24 25 23 28   GLUCOSE 226* 204* 211* 287* 298*  BUN 30* 31* 25* 21 21  CREATININE 0.82 0.74 0.76 0.62 0.69  CALCIUM 9.0 9.3 8.9 8.6* 9.3  MG 2.1 2.0 2.0 2.1 2.0   Liver Function Tests: Recent Labs  Lab 12/28/19 0749 12/29/19 0709 12/30/19 0553 12/31/19 0504 01/01/20 0600  AST 34 30 28 26 22   ALT 28 25 22 23 23   ALKPHOS 48 49 51 52 64  BILITOT 0.6 0.6 0.6 0.7 0.7  PROT 5.9* 5.7* 5.6* 5.2* 5.4*  ALBUMIN 3.1* 3.0* 2.9* 2.9* 2.9*   CBC: Recent Labs  Lab 12/28/19 0749 12/29/19 0709 12/30/19 0553 12/31/19 0504 01/01/20 0600  WBC 3.0* 4.4 4.4 4.8 7.7  NEUTROABS 1.6* 2.7 2.9 3.4 5.7  HGB 15.4* 14.8 15.3* 14.9 15.1*  HCT 48.3* 45.7 47.0* 45.7  45.4  MCV 90.4 88.9 89.0 89.1 88.0  PLT 144* 199 216 247 292   Urine analysis:    Component Value Date/Time   COLORURINE YELLOW 10/04/2017 1544   APPEARANCEUR HAZY (A) 10/04/2017 1544   LABSPEC 1.004 (L) 10/04/2017 1544   PHURINE 7.0 10/04/2017 1544   GLUCOSEU NEGATIVE 10/04/2017 1544   [place] 10/04/2017 1544   [place] 10/04/2017 1544   KETONESUR NEGATIVE 10/04/2017 1544   PROTEINUR NEGATIVE 10/04/2017 1544   UROBILINOGEN 0.2 08/20/2015 1335   NITRITE POSITIVE (A) 10/04/2017 1544   LEUKOCYTESUR LARGE (A) 10/04/2017 1544    Recent Results (from the past 240 hour(s))  Blood Culture (routine x 2)     Status: None   Collection Time: 12/27/19 12:27 PM   Specimen: BLOOD  Result Value Ref Range Status   Specimen Description BLOOD RIGHT ANTECUBITAL  Final   Special Requests   Final    BOTTLES DRAWN AEROBIC AND ANAEROBIC Blood Culture adequate volume   Culture   Final    NO GROWTH 5 DAYS Performed at [facility], [address] [place], [place] [address]    Report Status 01/01/2020 FINAL  Final  Blood Culture (routine x 2)     Status: None   Collection Time: 12/27/19 12:28 PM   Specimen: BLOOD  Result Value Ref Range Status   Specimen Description BLOOD LEFT ANTECUBITAL  Final   Special Requests   Final    BOTTLES DRAWN AEROBIC AND ANAEROBIC Blood  Culture adequate volume   Culture   Final    NO GROWTH 5 DAYS Performed at [facility], [address], [place], [place] [address]    Report Status 01/01/2020 FINAL  Final     Scheduled Meds: . ALPRAZolam  0.25 mg Oral BID  . vitamin C  500 mg Oral BID  . busPIRone  15 mg Oral BID  . dexamethasone (DECADRON) injection  6 mg Intravenous Q24H  . enoxaparin (LOVENOX) injection  40 mg Subcutaneous Q24H  . escitalopram  20 mg Oral q morning - 10a  . furosemide  20 mg Oral QODAY  . Ipratropium-Albuterol  1 puff Inhalation Q6H  . isosorbide mononitrate  60 mg Oral Daily  . mirtazapine  7.5 mg Oral QHS  .  pantoprazole  40 mg Oral Daily  . polyethylene glycol  17 g Oral BID  . pravastatin  40 mg Oral Daily  . senna  2 tablet Oral Daily  . sodium chloride flush  3 mL Intravenous Q12H  . umeclidinium bromide  1 puff Inhalation Daily  . zinc sulfate  220 mg Oral Daily   Continuous Infusions: . sodium chloride      Procedures/Studies: CT ANGIO CHEST PE W OR WO CONTRAST  Result Date: 12/29/2019 CLINICAL DATA:  Short of breath. COVID pneumonia. Elevated D-dimer and hypoxia. EXAM: CT ANGIOGRAPHY CHEST WITH CONTRAST TECHNIQUE: Multidetector CT imaging of the chest was performed using the standard protocol during bolus administration of intravenous contrast. Multiplanar CT image reconstructions and MIPs were obtained to evaluate the vascular anatomy. CONTRAST:  164mL OMNIPAQUE IOHEXOL 350 MG/ML SOLN COMPARISON:  08/23/2017 FINDINGS: Cardiovascular: The main pulmonary artery and its branches are patent. There is no saddle embolus or central obstructing pulmonary artery embolus. No suspicious filling defects within the lobar or segmental pulmonary arteries. Mediastinum/Nodes: 1.9 cm low-density nodule in left lobe of thyroid gland noted. Right lobe of thyroid gland unremarkable. The trachea appears patent and is midline. Small hiatal hernia. Esophagus otherwise unremarkable. No thoracic adenopathy identified. Lungs/Pleura: Extensive, multifocal bilateral areas of ground-glass attenuation and interstitial thickening noted throughout both lungs compatible with multifocal atypical viral pneumonia. No pleural effusion or pneumothorax. No atelectasis. Upper Abdomen: Liver cysts are again identified. No acute abnormality identified. Musculoskeletal: Spondylosis within the thoracic spine. Moderate degenerative changes noted within both glenohumeral joints. Review of the MIP images confirms the above findings. IMPRESSION: 1. No evidence for acute pulmonary embolus. 2. Multifocal bilateral areas of ground-glass attenuation  and interstitial thickening compatible with multifocal atypical viral pneumonia. 3. Aortic atherosclerosis. 4. Left lobe of thyroid gland nodule. Recommend nonemergent thyroid US(ref: J Am Coll Radiol. 2015 Feb;12(2): 143-50). Aortic Atherosclerosis (ICD10-I70.0). Electronically Signed   By: Kerby Moors M.D.   On: 12/29/2019 16:12   DG CHEST PORT 1 VIEW  Result Date: 12/27/2019 CLINICAL DATA:  Increased oxygen demand. COVID positive. EXAM: PORTABLE CHEST 1 VIEW COMPARISON:  Radiograph earlier this day at 12:11 hour FINDINGS: Similar patchy bilateral lung opacities, basilar predominant. No significant change from earlier today. Unchanged heart size and mediastinal contours. No pneumothorax or large pleural effusion. IMPRESSION: Patchy bilateral lung opacities consistent with COVID-19 pneumonia, unchanged from earlier this day. Electronically Signed   By: Keith Rake M.D.   On: 12/27/2019 23:45   DG Chest Port 1 View  Result Date: 12/27/2019 CLINICAL DATA:  COVID exposure. Shortness of breath, fever and headache. EXAM: PORTABLE CHEST 1 VIEW COMPARISON:  12/27/2016 FINDINGS: Heart size normal. No pleural effusion or edema. Bilateral lower lung  zone interstitial opacities are identified, new from previous exam. The visualized osseous structures appear intact. IMPRESSION: 1. Bilateral lower lung zone interstitial opacities. Imaging findings compatible with an inflammatory or infectious process. Atypical viral pneumonia not excluded. Electronically Signed   By: Kerby Moors M.D.   On: 12/27/2019 12:26    Barton Dubois, MD  [facility] Pager [phone]   01/01/2020, 5:38 PM   LOS: 5 days

## 2020-01-01 NOTE — Clinical Social Work Note (Signed)
Patient is from home. COVID+. Completed Remdesiver. Currently on 6L HFNC. Patient is not on oxygen at baseline. Will potentially need oxygen at baseline. Discussed potential need for oxygen with patient as well as DME suppliers.  Referral made to Blake Divine at [facility].     Vannessa Godown, Clydene Pugh, LCSW

## 2020-01-01 NOTE — Progress Notes (Signed)
NAME:  Vickie [name], MRN:  [id number], DOB:  February 01, 1946, LOS: 5 ADMISSION DATE:  12/27/2019, CONSULTATION DATE:  12/30/2018 REFERRING MD:  Dr. Carles Collet, [facility], CHIEF COMPLAINT:  Short of breath   Brief History   74 yo female former smoker presented to ER with dyspnea after exposure to [place].  Found to have hypoxia with b/l pulmonary infiltrates and COVID 19 positive.  Noted to have progressive hypoxia and PCCM asked to assess.  Past Medical History  CAD, COPD, HTN, HLD, OA, GERD  Significant Hospital Events   1/01 Admit  Consults:  Palliative care  Procedures:    Significant Diagnostic Tests:  CT angio chest 1/03 >> b/l GGO and interstitial thickening b/l  Micro Data:  SARS CoV2 1/01 >> positive  COVID treatment  Decadron 1/01 >> Remdesivir 1/01 > 1/05  Interim history/subjective:  Breathing feels better.  Not having cough.  Appetite good.  No fever.  Objective   Blood pressure (!) 148/75, pulse (!) 56, temperature 98.4 F (36.9 C), temperature source Oral, resp. rate 20, height 4\' 11"  (1.499 m), weight 81.8 kg, SpO2 92 %.        Intake/Output Summary (Last 24 hours) at 01/01/2020 1342 Last data filed at 01/01/2020 0900 Gross per 24 hour  Intake 720 ml  Output --  Net 720 ml   Filed Weights   12/27/19 1714  Weight: 81.8 kg    Examination:  General - alert Eyes - pupils reactive ENT - no sinus tenderness, no stridor Cardiac - regular rate/rhythm, no murmur Chest - scattered rhonchi Abdomen - soft, non tender, + bowel sounds Extremities - absence of several digits on each hand Skin - no rashes Neuro - normal strength, moves extremities, follows commands Psych - normal mood and behavior   Resolved Hospital Problem list     Assessment & Plan:   Acute hypoxic respiratory failure from COVID 19 pneumonia. - DNI - goal SpO2 85 to 95% - day 6/10 of decadron - completed remdesivir - CRP in normal range >> defer tocilizumab  - prone positioning as  tolerated - bronchial hygiene - mobilize as able - continue vit C, zinc  Hx of COPD. - continue incruse - prn albuterol  Hx of CAD, HLD, HTN. - lasix, imdur, pravachol  Hx of anxiety, depression. - xanax, buspar, remeron   Best practice:  Diet: heart healthy DVT prophylaxis: lovenox GI prophylaxis: not indicated Mobility: OOB to chair Code Status: no intubation Disposition: progressive care  Defer further management to hospitalist team.  PCCM will sign off.  Please call if additional help needed while she is in hospital.   Labs:   CMP Latest Ref Rng & Units 01/01/2020 12/31/2019 12/30/2019  Glucose 70 - 99 mg/dL 298(H) 287(H) 211(H)  BUN 8 - 23 mg/dL 21 21 25(H)  Creatinine 0.44 - 1.00 mg/dL 0.69 0.62 0.76  Sodium 135 - 145 mmol/L 139 137 139  Potassium 3.5 - 5.1 mmol/L 5.0 4.5 4.5  Chloride 98 - 111 mmol/L 102 105 105  CO2 22 - 32 mmol/L 28 23 25   Calcium 8.9 - 10.3 mg/dL 9.3 8.6(L) 8.9  Total Protein 6.5 - 8.1 g/dL 5.4(L) 5.2(L) 5.6(L)  Total Bilirubin 0.3 - 1.2 mg/dL 0.7 0.7 0.6  Alkaline Phos 38 - 126 U/L 64 52 51  AST 15 - 41 U/L 22 26 28   ALT 0 - 44 U/L 23 23 22     CBC Latest Ref Rng & Units 01/01/2020 12/31/2019 12/30/2019  WBC 4.0 - 10.5  K/uL 7.7 4.8 4.4  Hemoglobin 12.0 - 15.0 g/dL 15.1(H) 14.9 15.3(H)  Hematocrit 36.0 - 46.0 % 45.4 45.7 47.0(H)  Platelets 150 - 400 K/uL 292 247 216    ABG    Component Value Date/Time   PHART 7.379 11/19/2007 1300   PCO2ART 44.1 11/19/2007 1300   PO2ART 75.7 (L) 11/19/2007 1300   HCO3 25.5 (H) 11/19/2007 1300   TCO2 23.0 11/19/2007 1300   O2SAT 95.0 11/19/2007 [place], MD [place] 01/01/2020, 1:45 PM

## 2020-01-02 DIAGNOSIS — Z515 Encounter for palliative care: Secondary | ICD-10-CM

## 2020-01-02 NOTE — Progress Notes (Signed)
Spoke with Mrs Goll by phone for spiritual and emotional support. She shared family and faith history. She shared repeatedly that she wanted to be at home and we talked about her anxiety over being away from home, that some of her medicines made her feel anxious also. Active listening as she shared what anxiety was like for her, how she has coped before and what she needs. We prayed and planned to continue to support her. -

## 2020-01-02 NOTE — Progress Notes (Signed)
PROGRESS NOTE  Vickie [name] [id number] DOB: [birth year] DOA: 12/27/2019 PCP: Sharilyn Sites, MD  Brief History: 74 year old female with a history of coronary artery disease status post PCI 2018, COPD, hypertension, hyperlipidemia, GERD presenting with 2-day history of worsening shortness of breath and coughing. The patient has had some associated generalized weakness and myalgias. She stated that her symptoms began on 12/21/2019. She has been exposed to numerous family members who have been diagnosed with COVID-19 over the holiday. Notably, she stated that she was first exposed to her great granddaughter on 12/21/2020who was tested positive for COVID-19. Upon presentation, the patient was hypoxic requiring 4 L nasal cannula. BMP and LFTs were essentially unremarkable. WBC 6.0, hemoglobin 17.5, platelets 254,000. Inflammatory markers were elevated with ferritin 637, CRP 1.2, lactic acid 2.0, D-dimer 0.92. Chest x-ray showed bilateral patchy opacities. The patient was started on IV steroids and remdesivir.  She finished remdesivir on 12/31/19, but remained significantly hypoxic.  Even though patient was less dyspneic and inflammatory markers were improving, she required 6L HFNC with saturation in mid 80s.  Pulmonary was consulted to assist.  Assessment/Plan: Acute respiratory failure with hypoxia -Secondary to COVID-19 pneumonia -Presently stable on6LHFNC-->84-85% -CRP 1.2>>>1.2>>>0.7>>>0.6>>>0.6 -Ferritin 637>>>517>>514>>568>>>467 -D-dimer 0.92>>>0.62>>0.47>>0.36>>0.77 -Fibrinogen 445 -PCT<0.10 -Lactic acid 2.0>>>3.7 -Continue IV steroids and remdesivir -CTA chest--no PE; extensive bilateral GGO and interstitial thickening -encouraged pt to lay prone for 2 hours at a time if can be tolerated -encouraged incentive spirometry -pulmonary consult appreciated   COPD -ContinueCombivent and Incruse Ellipta.  Essential hypertension -Holding labetalol secondary  to soft blood pressures and bradycardia -BP remains well controlled  Coronary artery disease -No chest pain presently -Personally reviewed EKG--sinus rhythm, nonspecific T wave changes -Continue statin -Continue Imdur  Hyperlipidemia -Continue statin and fish oil  Anxiety/depression -Continue Xanax, BuSpar, and Remeron  Thrombocytopenia -due to infectious process -improved with treatment -serial CBC  Goals of Care -Advance care planning, including the explanation and discussion of advance directives was carried out with the patient and family.  Code status including explanations of "Full Code" and "DNR" and alternatives were discussed in detail.  Discussion of end-of-life issues including but not limited palliative care, hospice care and the concept of hospice, other end-of-life care options, power of attorney for health care decisions, living wills, and physician orders for life-sustaining treatment were also discussed with the patient and family.  Total face to face time 16 minutes. -pt confirmed wishes to be DNI   Disposition Plan: Not stable for discharge, still requiring significant amount of oxygen supplementation.  Physical therapy evaluation/assessment recommending home health services (requested).  Oxygen equipment still pending to be delivered prior to discharge.  Family Communication:NoFamily at bedside  Consultants:pulmonary  Code Status: FULL   DVT Prophylaxis: [place] Lovenox   Procedures: As Listed in Progress Note Above  Antibiotics: None  Subjective: No fever, no chills, no chest pain, no nausea, no vomiting.  Still requiring high flow nasal cannula supplementation about 6 L to maintain O2 sat more than 85%.  Patient is afebrile, and otherwise in no acute distress.  Objective: Vitals:   01/02/20 0616 01/02/20 0618 01/02/20 0652 01/02/20 1500  BP: (!) 153/64   (!) 113/51  Pulse: 66 67  79  Resp: 20   20  Temp: 98 F (36.7 C)   (!)  97.4 F (36.3 C)  TempSrc: Oral   Oral  SpO2: (!) 85% 90% (!) 88% (!) 89%  Weight:  Height:       No intake or output data in the 24 hours ending 01/02/20 1816 Weight change:   Exam: General exam: Alert, awake, oriented x 3; no chest pain, no nausea, no vomiting, reports no shortness of breath currently.  Still requiring 6 L high flow nasal cannula supplementation to maintain O2 sats above 85%.  Patient is afebrile and otherwise in no acute distress. Respiratory system: Positive rhonchi right, decreased breath sounds at the bases; no frank crackles, no using accessory muscles.   Cardiovascular system:RRR. No murmurs, rubs, gallops. Gastrointestinal system: Abdomen is nondistended, soft and nontender. No organomegaly or masses felt. Normal bowel sounds heard. Central nervous system: Alert and oriented. No focal neurological deficits. Extremities: No C/C/E, +pedal pulses Skin: No rashes, lesions or ulcers Psychiatry: Judgement and insight appear normal. Mood & affect appropriate.    Data Reviewed: I have personally reviewed following labs and imaging studies  Basic Metabolic Panel: Recent Labs  Lab 12/28/19 0749 12/29/19 0709 12/30/19 0553 12/31/19 0504 01/01/20 0600  NA 139 138 139 137 139  K 4.4 4.8 4.5 4.5 5.0  CL 105 103 105 105 102  CO2 23 24 25 23 28   GLUCOSE 226* 204* 211* 287* 298*  BUN 30* 31* 25* 21 21  CREATININE 0.82 0.74 0.76 0.62 0.69  CALCIUM 9.0 9.3 8.9 8.6* 9.3  MG 2.1 2.0 2.0 2.1 2.0   Liver Function Tests: Recent Labs  Lab 12/28/19 0749 12/29/19 0709 12/30/19 0553 12/31/19 0504 01/01/20 0600  AST 34 30 28 26 22   ALT 28 25 22 23 23   ALKPHOS 48 49 51 52 64  BILITOT 0.6 0.6 0.6 0.7 0.7  PROT 5.9* 5.7* 5.6* 5.2* 5.4*  ALBUMIN 3.1* 3.0* 2.9* 2.9* 2.9*   CBC: Recent Labs  Lab 12/28/19 0749 12/29/19 0709 12/30/19 0553 12/31/19 0504 01/01/20 0600  WBC 3.0* 4.4 4.4 4.8 7.7  NEUTROABS 1.6* 2.7 2.9 3.4 5.7  HGB 15.4* 14.8 15.3* 14.9 15.1*    HCT 48.3* 45.7 47.0* 45.7 45.4  MCV 90.4 88.9 89.0 89.1 88.0  PLT 144* 199 216 247 292   Urine analysis:    Component Value Date/Time   COLORURINE YELLOW 10/04/2017 1544   APPEARANCEUR HAZY (A) 10/04/2017 1544   LABSPEC 1.004 (L) 10/04/2017 1544   PHURINE 7.0 10/04/2017 1544   GLUCOSEU NEGATIVE 10/04/2017 1544   HGBUR NEGATIVE 10/04/2017 1544   [place] 10/04/2017 1544   KETONESUR NEGATIVE 10/04/2017 1544   PROTEINUR NEGATIVE 10/04/2017 1544   UROBILINOGEN 0.2 08/20/2015 1335   NITRITE POSITIVE (A) 10/04/2017 1544   LEUKOCYTESUR LARGE (A) 10/04/2017 1544    Recent Results (from the past 240 hour(s))  Blood Culture (routine x 2)     Status: None   Collection Time: 12/27/19 12:27 PM   Specimen: BLOOD  Result Value Ref Range Status   Specimen Description BLOOD RIGHT ANTECUBITAL  Final   Special Requests   Final    BOTTLES DRAWN AEROBIC AND ANAEROBIC Blood Culture adequate volume   Culture   Final    NO GROWTH 5 DAYS Performed at [facility], [address], [place], [place] [address]    Report Status 01/01/2020 FINAL  Final  Blood Culture (routine x 2)     Status: None   Collection Time: 12/27/19 12:28 PM   Specimen: BLOOD  Result Value Ref Range Status   Specimen Description BLOOD LEFT ANTECUBITAL  Final   Special Requests   Final    BOTTLES DRAWN AEROBIC AND ANAEROBIC  Blood Culture adequate volume   Culture   Final    NO GROWTH 5 DAYS Performed at [facility], [address] [place], [place] [address]    Report Status 01/01/2020 FINAL  Final     Scheduled Meds: . ALPRAZolam  0.25 mg Oral BID  . vitamin C  500 mg Oral BID  . busPIRone  15 mg Oral BID  . dexamethasone (DECADRON) injection  6 mg Intravenous Q24H  . enoxaparin (LOVENOX) injection  40 mg Subcutaneous Q24H  . escitalopram  20 mg Oral q morning - 10a  . furosemide  20 mg Oral QODAY  . Ipratropium-Albuterol  1 puff Inhalation Q6H  . isosorbide mononitrate  60 mg Oral Daily  .  mirtazapine  7.5 mg Oral QHS  . pantoprazole  40 mg Oral Daily  . polyethylene glycol  17 g Oral BID  . pravastatin  40 mg Oral Daily  . senna  2 tablet Oral Daily  . sodium chloride flush  3 mL Intravenous Q12H  . umeclidinium bromide  1 puff Inhalation Daily  . zinc sulfate  220 mg Oral Daily   Continuous Infusions: . sodium chloride      Procedures/Studies: CT ANGIO CHEST PE W OR WO CONTRAST  Result Date: 12/29/2019 CLINICAL DATA:  Short of breath. COVID pneumonia. Elevated D-dimer and hypoxia. EXAM: CT ANGIOGRAPHY CHEST WITH CONTRAST TECHNIQUE: Multidetector CT imaging of the chest was performed using the standard protocol during bolus administration of intravenous contrast. Multiplanar CT image reconstructions and MIPs were obtained to evaluate the vascular anatomy. CONTRAST:  172mL OMNIPAQUE IOHEXOL 350 MG/ML SOLN COMPARISON:  08/23/2017 FINDINGS: Cardiovascular: The main pulmonary artery and its branches are patent. There is no saddle embolus or central obstructing pulmonary artery embolus. No suspicious filling defects within the lobar or segmental pulmonary arteries. Mediastinum/Nodes: 1.9 cm low-density nodule in left lobe of thyroid gland noted. Right lobe of thyroid gland unremarkable. The trachea appears patent and is midline. Small hiatal hernia. Esophagus otherwise unremarkable. No thoracic adenopathy identified. Lungs/Pleura: Extensive, multifocal bilateral areas of ground-glass attenuation and interstitial thickening noted throughout both lungs compatible with multifocal atypical viral pneumonia. No pleural effusion or pneumothorax. No atelectasis. Upper Abdomen: Liver cysts are again identified. No acute abnormality identified. Musculoskeletal: Spondylosis within the thoracic spine. Moderate degenerative changes noted within both glenohumeral joints. Review of the MIP images confirms the above findings. IMPRESSION: 1. No evidence for acute pulmonary embolus. 2. Multifocal bilateral  areas of ground-glass attenuation and interstitial thickening compatible with multifocal atypical viral pneumonia. 3. Aortic atherosclerosis. 4. Left lobe of thyroid gland nodule. Recommend nonemergent thyroid US(ref: J Am Coll Radiol. 2015 Feb;12(2): 143-50). Aortic Atherosclerosis (ICD10-I70.0). Electronically Signed   By: Kerby Moors M.D.   On: 12/29/2019 16:12   DG CHEST PORT 1 VIEW  Result Date: 12/27/2019 CLINICAL DATA:  Increased oxygen demand. COVID positive. EXAM: PORTABLE CHEST 1 VIEW COMPARISON:  Radiograph earlier this day at 12:11 hour FINDINGS: Similar patchy bilateral lung opacities, basilar predominant. No significant change from earlier today. Unchanged heart size and mediastinal contours. No pneumothorax or large pleural effusion. IMPRESSION: Patchy bilateral lung opacities consistent with COVID-19 pneumonia, unchanged from earlier this day. Electronically Signed   By: Keith Rake M.D.   On: 12/27/2019 23:45   DG Chest Port 1 View  Result Date: 12/27/2019 CLINICAL DATA:  COVID exposure. Shortness of breath, fever and headache. EXAM: PORTABLE CHEST 1 VIEW COMPARISON:  12/27/2016 FINDINGS: Heart size normal. No pleural effusion or edema. Bilateral lower  lung zone interstitial opacities are identified, new from previous exam. The visualized osseous structures appear intact. IMPRESSION: 1. Bilateral lower lung zone interstitial opacities. Imaging findings compatible with an inflammatory or infectious process. Atypical viral pneumonia not excluded. Electronically Signed   By: Kerby Moors M.D.   On: 12/27/2019 12:26    Barton Dubois, MD  [facility] Pager [phone]   01/02/2020, 6:16 PM   LOS: 6 days

## 2020-01-02 NOTE — Progress Notes (Signed)
Palliative: Chart reviewed. Conference with nursing staff. Did well with PT.  Days 6/10 of decadron per pulmonary.  Call to Ms. Eulas Post.  She is alert and oriented, but she sounds weak.  We talked about her acute health problems, treatment plan, outpatient follow-ups.  We also talked about physical therapy evaluation, her goal is to go home.  I shared that she will likely need home oxygen. We talked about increased blood sugars, normal during acute illness, especially with the use of steroids. Reassurance and support given. Consult to spiritual care.   Plan:  Goals is return to home. Will likely need supplemental oxygen.     46 minutes Quinn Axe, NP Palliative Medicine Team Team Phone # [phone] Greater than 50% of this time was spent counseling and coordinating care related to the above assessment and plan.

## 2020-01-02 NOTE — Plan of Care (Signed)
  Problem: Acute Rehab PT Goals(only PT should resolve) Goal: Pt Will Go Supine/Side To Sit Outcome: Progressing Flowsheets (Taken 01/02/2020 1458) Pt will go Supine/Side to Sit: Independently Goal: Patient Will Transfer Sit To/From Stand Outcome: Progressing Flowsheets (Taken 01/02/2020 1458) Patient will transfer sit to/from stand: with modified independence Goal: Pt Will Transfer Bed To Chair/Chair To Bed Outcome: Progressing Flowsheets (Taken 01/02/2020 1458) Pt will Transfer Bed to Chair/Chair to Bed: with modified independence Goal: Pt Will Ambulate Outcome: Progressing Flowsheets (Taken 01/02/2020 1458) Pt will Ambulate:  100 feet  with modified independence   2:58 PM, 01/02/20 Lonell Grandchild, MPT Physical Therapist with [facility] [phone] office [phone] mobile phone

## 2020-01-02 NOTE — Evaluation (Signed)
Physical Therapy Evaluation Patient Details Name: Vickie [name] MRN: [id number] DOB: 05-21-1946 Today's Date: 01/02/2020   History of Present Illness  Vickie [name] is a 74 y.o. female with medical history significant for CAD with prior PCI in 2018, COPD, obesity, hypertension, dyslipidemia, osteoarthritis, and GERD who presented with some nonproductive cough and shortness of breath over the last 2 days.  She had been exposed to multiple family members who have now tested positive for COVID-19 over the Christmas holiday.  She has some conversational dyspnea with no worsening edema or orthopnea.  She denies any chest pain, fevers, chills, nausea, vomiting, or diarrhea.  She denies any wheezing or chest tightness.    Clinical Impression  Patient demonstrates labored movement for ambulation in room without loss of balance or use of an AD, has to walk slower due to fatigue and mild SOB with SpO2 dropping from 90% to 85% while on supplemental O2.  Patient tolerated siting up in chair after therapy - RN/NT notified.  Patient will benefit from continued physical therapy in hospital and recommended venue below to increase strength, balance, endurance for safe ADLs and gait.    Follow Up Recommendations Home health PT;Supervision for mobility/OOB;Supervision - Intermittent    Equipment Recommendations  None recommended by PT    Recommendations for Other Services       Precautions / Restrictions Precautions Precautions: Fall Restrictions Weight Bearing Restrictions: No      Mobility  Bed Mobility Overal bed mobility: Modified Independent             General bed mobility comments: increased time  Transfers Overall transfer level: Needs assistance Equipment used: None Transfers: Sit to/from Stand;Stand Pivot Transfers Sit to Stand: Supervision;Min guard Stand pivot transfers: Supervision;Min guard       General transfer comment: slightly labored, increased  time  Ambulation/Gait Ambulation/Gait assistance: Supervision;Min guard Gait Distance (Feet): 30 Feet Assistive device: None Gait Pattern/deviations: Decreased step length - right;Decreased step length - left;Decreased stride length Gait velocity: decreased   General Gait Details: slow labored cadence with decreased step/stride length without loss of balance, on 4-5 LPM O2 with SpO2 dropping from 90-85%, limited secondary to c/o fatigue  Stairs            Wheelchair Mobility    Modified Rankin (Stroke Patients Only)       Balance Overall balance assessment: Needs assistance Sitting-balance support: Feet supported;No upper extremity supported Sitting balance-Leahy Scale: Good Sitting balance - Comments: seated at EOB   Standing balance support: During functional activity;No upper extremity supported Standing balance-Leahy Scale: Fair Standing balance comment: without AD                             Pertinent Vitals/Pain Pain Assessment: No/denies pain    Home Living Family/patient expects to be discharged to:: Private residence Living Arrangements: Spouse/significant other Available Help at Discharge: Family;Available PRN/intermittently Type of Home: House Home Access: Level entry     Home Layout: One level Home Equipment: Clinical cytogeneticist - 2 wheels;Cane - single point      Prior Function Level of Independence: Independent         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        Extremity/Trunk Assessment   Upper Extremity Assessment Upper Extremity Assessment: Generalized weakness    Lower Extremity Assessment Lower Extremity Assessment: Generalized weakness    Cervical / Trunk Assessment Cervical / Trunk Assessment:  Normal  Communication   Communication: No difficulties  Cognition Arousal/Alertness: Awake/alert Behavior During Therapy: WFL for tasks assessed/performed Overall Cognitive Status: Within Functional  Limits for tasks assessed                                        General Comments      Exercises     Assessment/Plan    PT Assessment Patient needs continued PT services  PT Problem List Decreased strength;Decreased activity tolerance;Decreased balance;Decreased mobility       PT Treatment Interventions Balance training;Gait training;Stair training;Functional mobility training;Therapeutic activities;Therapeutic exercise;Patient/family education    PT Goals (Current goals can be found in the Care Plan section)  Acute Rehab PT Goals Patient Stated Goal: return home with family to assist PT Goal Formulation: With patient Time For Goal Achievement: 01/06/20 Potential to Achieve Goals: Good    Frequency Min 3X/week   Barriers to discharge        Co-evaluation               AM-PAC PT "6 Clicks" Mobility  Outcome Measure Help needed turning from your back to your side while in a flat bed without using bedrails?: None Help needed moving from lying on your back to sitting on the side of a flat bed without using bedrails?: None Help needed moving to and from a bed to a chair (including a wheelchair)?: A Little Help needed standing up from a chair using your arms (e.g., wheelchair or bedside chair)?: A Little Help needed to walk in hospital room?: A Little Help needed climbing 3-5 steps with a railing? : A Lot 6 Click Score: 19    End of Session Equipment Utilized During Treatment: Oxygen Activity Tolerance: Patient tolerated treatment well;Patient limited by fatigue Patient left: in chair;with call bell/phone within reach Nurse Communication: Mobility status PT Visit Diagnosis: Unsteadiness on feet (R26.81);Other abnormalities of gait and mobility (R26.89);Muscle weakness (generalized) (M62.81)    Time: [id number] PT Time Calculation (min) (ACUTE ONLY): 22 min   Charges:   PT Evaluation $PT Eval Moderate Complexity: 1 Mod PT  Treatments $Therapeutic Activity: 8-22 mins        2:55 PM, 01/02/20 Lonell Grandchild, MPT Physical Therapist with [facility] [phone] office [phone] mobile phone

## 2020-01-03 LAB — CREATININE, SERUM
Creatinine, Ser: 0.71 mg/dL (ref 0.44–1.00)
GFR calc Af Amer: >60 mL/min (ref >60)
GFR calc non Af Amer: >60 mL/min (ref >60)

## 2020-01-03 MED ORDER — UMECLIDINIUM BROMIDE 62.5 MCG/INH IN AEPB
INHALATION_SPRAY | RESPIRATORY_TRACT | Status: AC
  Filled 2020-01-03: qty 7

## 2020-01-03 MED ORDER — PREDNISONE 10 MG PO TABS: ORAL_TABLET | ORAL | 0 refills | Status: DC

## 2020-01-03 MED ORDER — UMECLIDINIUM BROMIDE 62.5 MCG/INH IN AEPB: 1.0000 | INHALATION_SPRAY | Freq: Every day | RESPIRATORY_TRACT | 1 refills | Status: DC

## 2020-01-03 MED ORDER — IPRATROPIUM-ALBUTEROL 20-100 MCG/ACT IN AERS: 1.0000 | INHALATION_SPRAY | Freq: Four times a day (QID) | RESPIRATORY_TRACT | 1 refills | Status: DC | PRN

## 2020-01-03 NOTE — Progress Notes (Signed)
SATURATION QUALIFICATIONS: (This note is used to comply with regulatory documentation for home oxygen)  Patient Saturations on Room Air at Rest = 85%   Patient Saturations on 6 Liters of oxygen at rest = 90%  Patient Saturation on 6 L of oxygen while ambulating = 81%    Patient becomes tachypnic while ambulating, but states that she feels fine while ambulating.  Patient saturation on 6 L of oxygen returns to 90% at rest.

## 2020-01-03 NOTE — Progress Notes (Signed)
At 1405hr RT came to assess patient , Patient was lying down with SAT 83% on 9L HFNC. RT removed excess tubing and increased to 12L for SATs 90%. Will notify MD and RN.

## 2020-01-03 NOTE — Discharge Summary (Signed)
Physician Discharge Summary  Vickie [name] [id number] DOB: 10-22-1946 DOA: 12/27/2019  PCP: Sharilyn Sites, MD  Admit date: 12/27/2019 Discharge date: 01/03/2020  Time spent: 35 minutes  Recommendations for Outpatient Follow-up:  1. Repeat CBC to follow platelets count 2. Repeat basic metabolic panel to evaluate lites renal function 3. Reassessed chest x-ray in 6 weeks to assure complete resolution of infiltrates 4. Continue assisting with oxygen weaning process as tolerated 5. Reassess blood pressure and resume beta-blocker therapy as appropriate.   Discharge Diagnoses:  Active Problems:   Essential hypertension   Obesity (BMI 30-39.9)   Acute hypoxemic respiratory failure due to COVID-19 [facility])   Pneumonia due to COVID-19 virus   Thrombocytopenia (HCC)   Hypoxia   Increased oxygen demand   Acute respiratory failure with hypoxia (HCC)   Goals of care, counseling/discussion   COVID-19   Palliative care by specialist   Discharge Condition: Stable and improved.  Patient discharged home with arrangement for home health services and oxygen supplementation.  Follow-up with PCP in 10 days has been recommended.  CODE STATUS: DNI  Diet recommendation: Heart healthy diet  Filed Weights   12/27/19 1714  Weight: 81.8 kg    History of present illness:  74 year old female with a history of coronary artery disease status post PCI 2018, COPD, hypertension, hyperlipidemia, GERD presenting with 2-day history of worsening shortness of breath and coughing. The patient has had some associated generalized weakness and myalgias. She stated that her symptoms began on 12/21/2019. She has been exposed to numerous family members who have been diagnosed with COVID-19 over the holiday. Notably, she stated that she was first exposed to her great granddaughter on 12/21/2020who was tested positive for COVID-19. Upon presentation, the patient was hypoxic requiring 4 L nasal cannula. BMP and LFTs  were essentially unremarkable. WBC 6.0, hemoglobin 17.5, platelets 254,000. Inflammatory markers were elevated with ferritin 637, CRP 1.2, lactic acid 2.0, D-dimer 0.92. Chest x-ray showed bilateral patchy opacities. The patient was started on IV steroids and remdesivir.  She finished remdesivir on 12/31/19, but remained significantly hypoxic.  Even though patient was less dyspneic and inflammatory markers were improving, she required 6L HFNC with saturation in mid 80s.  Pulmonary was consulted to assist.  Hospital Course:  Acute respiratory failure with hypoxia -Secondary to COVID-19 pneumonia -Presently stable on6LHFNC-->maintaining oxygen saturation above 85% -CRP 1.2>>>1.2>>>0.7>>>0.6>>>0.6 -Ferritin 637>>>517>>514>>568>>>467 -D-dimer 0.92>>>0.62>>0.47>>0.36>>0.77 -Patient has completed remdesivir infusion while in the hospital and will complete steroids tapering at discharge. -CTA chest--no PE; extensive bilateral GGO and interstitial thickening -encouraged pt to lay prone for 2 hours at a time if can be tolerated -encouraged to continue using incentive spirometry and to pace herself while performing physical activity. -pulmonary consult appreciated  -Patient with wishes to be DNI  COPD -Following recommendations by PCCM service patient will be discharged home on as needed Combivent and Incruse Ellipta. -Steroid tapering as per Covid protocol in place -Continue weaning off oxygen supplementation as tolerated.  Essential hypertension -Blood pressure has remained soft, but stable -Labetalol has been held/discontinued at discharge. -Reassess blood pressure medications and resume beta-blocker therapy as appropriate. -Heart healthy diet has been encouraged.  Coronary artery disease -No chest pain presently -Personally reviewed EKG during hospitalization, demonstrated--sinus rhythm, nonspecific T wave changes -Continue statin, Continue Imdur  -Close follow-up with  cardiologist and PCP after discharge for resumption of beta-blocker therapy once blood pressure and overall acute condition fully resolved.  Hyperlipidemia -Continue statin and fish oil -Heart healthy diet has been encouraged.  Anxiety/depression -Continue Xanax, BuSpar, and Remeron -Mood stable overall; no suicidal ideation or hallucination.  Thrombocytopenia -due to infectious process -improved with treatment and support -No overt bleeding appreciated. -Repeat CBC at follow-up visit to assess trend.  Goals of Care -Appreciate involvement by palliative care service and discussion of goals of care and advance directive. -Patient has expressed/decided to be a partial code (meaning: she wishes to be DNI, no intubation).   Procedures:  See below for x-ray reports.  Consultations:  PCCM  Palliative care  Discharge Exam: Vitals:   01/03/20 0623 01/03/20 0840  BP: 123/72   Pulse: 67   Resp: (!) 22   Temp: 97.6 F (36.4 C)   SpO2: (!) 88% (!) 85%    General: Afebrile, no chest pain, no nausea, no vomiting.  Reports no feeling short of breath or having acute distress.  With exertion patient is still desaturating requiring high flow 6 L nasal cannula supplementation.  As soon as she pace herself at rest oxygen saturation stabilizes on 6 L supplementation. Cardiovascular: S1 and S2, no rubs, no gallops, no JVD on exam. Respiratory: Scattered rhonchi at, decreased breath sounds at the bases; no frank crackles, no wheezing, no using accessory muscles at rest.  Appreciated tachypnea with exertion, but maintaining normal respiratory efforts. Abdomen: Soft, nontender, distended, positive bowel sounds Extremities: No edema, no cyanosis, no clubbing.  Discharge Instructions   Discharge Instructions    (HEART FAILURE PATIENTS) Call MD:  Anytime you have any of the following symptoms: 1) 3 pound weight gain in 24 hours or 5 pounds in 1 week 2) shortness of breath, with or without  a dry hacking cough 3) swelling in the hands, feet or stomach 4) if you have to sleep on extra pillows at night in order to breathe.   Complete by: As directed    Diet - low sodium heart healthy   Complete by: As directed    Discharge instructions   Complete by: As directed    Take medications as prescribed Follow up with PCP in 10 days Maintain adequate hydration Follow heart healthy diet and check weight on daily basis Pace yourself while performing any activity and use O2 supplementation (6L) as discussed 24/7.   Increase activity slowly   Complete by: As directed    MyChart COVID-19 home monitoring program   Complete by: Jan 03, 2020    Is the patient willing to use the [place] for home monitoring?: Yes   Temperature monitoring   Complete by: Jan 03, 2020    After how many days would you like to receive a notification of this patient's flowsheet entries?: 1     Allergies as of 01/03/2020      Reactions   Adhesive [tape] Other (See Comments)   REACTION: redness/irritation at application site. **Certain bandages/adhesives cause this reaction**   Avelox [moxifloxacin] Anaphylaxis   Bactrim [sulfamethoxazole-trimethoprim] Anaphylaxis, Shortness Of Breath, Rash, Other (See Comments)   REACTION: Choking, inability to swallow, redness   Levaquin [levofloxacin] Anaphylaxis, Shortness Of Breath, Rash, Other (See Comments)   Reaction:Choking Brand name Levaquin ok per pt   Mucinex [guaifenesin Er] Anaphylaxis, Shortness Of Breath, Rash   Penicillins Other (See Comments)   "Passed out" Has patient had a PCN reaction causing immediate rash, facial/tongue/throat swelling, SOB or lightheadedness with hypotension: Unknown Has patient had a PCN reaction causing severe rash involving mucus membranes or skin necrosis: No Has patient had a PCN reaction that required hospitalization: No Has patient had  a PCN reaction occurring within the last 10 years: No If all of the above answers  are "NO", then may proceed with Cephalosporin use.   Sudafed [pseudoephedrine Hcl] Shortness Of Breath, Rash, Other (See Comments)   REACTION: Choking, redness, inability to swallow   Crestor [rosuvastatin] Other (See Comments)   Leg pain   Lipitor [atorvastatin] Other (See Comments)   Leg pain   Vytorin [ezetimibe-simvastatin] Other (See Comments)   Leg pain   Norvasc [amlodipine] Other (See Comments)   unknown      Medication List    STOP taking these medications   doxycycline 100 MG capsule Commonly known as: VIBRAMYCIN   labetalol 100 MG tablet Commonly known as: NORMODYNE   ProAir HFA 108 (90 Base) MCG/ACT inhaler Generic drug: albuterol   Spiriva Respimat 2.5 MCG/ACT Aers Generic drug: Tiotropium Bromide Monohydrate Replaced by: umeclidinium bromide 62.5 MCG/INH Aepb     TAKE these medications   ALPRAZolam 0.5 MG tablet Commonly known as: XANAX Take 0.25 mg by mouth 2 (two) times daily.   AMBULATORY NON FORMULARY MEDICATION Inject 300 mg into the skin as directed. Medication Name:Inclisiran sodium 300 mg vs placebo   busPIRone 15 MG tablet Commonly known as: BUSPAR Take 15 mg by mouth 2 (two) times daily.   CALCIUM 600 + D PO Take 1 tablet by mouth daily.   CoQ10 100 MG Caps Take 100 mg by mouth every morning.   CRANBERRY PO Take 1 capsule by mouth daily.   escitalopram 20 MG tablet Commonly known as: LEXAPRO Take 20 mg by mouth every morning.   FISH OIL PO Take 1 capsule by mouth at bedtime.   furosemide 20 MG tablet Commonly known as: LASIX TAKE ONE TABLET EVERY OTHER DAY.   HAIR/SKIN/NAILS PO Take 1 tablet by mouth at bedtime.   Ipratropium-Albuterol 20-100 MCG/ACT Aers respimat Commonly known as: COMBIVENT Inhale 1 puff into the lungs every 6 (six) hours as needed for wheezing or shortness of breath.   isosorbide mononitrate 60 MG 24 hr tablet Commonly known as: IMDUR TAKE ONE TABLET BY MOUTH ONCE DAILY.   mirtazapine 15 MG  tablet Commonly known as: REMERON Take 7.5 mg by mouth at bedtime.   multivitamin tablet Take 1 tablet by mouth at bedtime.   nitroGLYCERIN 0.4 MG SL tablet Commonly known as: NITROSTAT PLACE 1 TAB UNDER TONGUE EVERY 5 MIN IF NEEDED FOR CHEST PAIN. MAY USE 3 TIMES.NO RELIEF CALL 911.   pantoprazole 40 MG tablet Commonly known as: PROTONIX TAKE ONE TABLET BY MOUTH ONCE DAILY.   potassium chloride SA 20 MEQ tablet Commonly known as: KLOR-CON TAKE 1 TABLET EVERY OTHER DAY, TAKE ON DAYS THAT YOU TAKE FUROSEMIDE.   pravastatin 40 MG tablet Commonly known as: PRAVACHOL Take 1 tablet (40 mg total) by mouth daily.   predniSONE 10 MG tablet Commonly known as: DELTASONE Take 6 tables by mouth daily on day one, and then decrease by one tablet daily until all tablets are gone. Start taking on: January 04, 2020   umeclidinium bromide 62.5 MCG/INH Aepb Commonly known as: INCRUSE ELLIPTA Inhale 1 puff into the lungs daily. Start taking on: January 04, 2020 Replaces: Spiriva Respimat 2.5 MCG/ACT Aers            Durable Medical Equipment  (From admission, onward)         Start     Ordered   01/02/20 1816  DME Oxygen  (Discharge Planning)  Once    Question Answer Comment  Length of Need 12 Months   Mode or (Route) Nasal cannula   Liters per Minute 6   Frequency Continuous (stationary and portable oxygen unit needed)   Oxygen conserving device Yes   Oxygen delivery system Gas      01/02/20 1815         Allergies  Allergen Reactions  . Adhesive [Tape] Other (See Comments)    REACTION: redness/irritation at application site. **Certain bandages/adhesives cause this reaction**  . Avelox [Moxifloxacin] Anaphylaxis  . Bactrim [Sulfamethoxazole-Trimethoprim] Anaphylaxis, Shortness Of Breath, Rash and Other (See Comments)    REACTION: Choking, inability to swallow, redness  . Levaquin [Levofloxacin] Anaphylaxis, Shortness Of Breath, Rash and Other (See Comments)     Reaction:Choking Brand name Levaquin ok per pt  . Mucinex [Guaifenesin Er] Anaphylaxis, Shortness Of Breath and Rash  . Penicillins Other (See Comments)    "Passed out" Has patient had a PCN reaction causing immediate rash, facial/tongue/throat swelling, SOB or lightheadedness with hypotension: Unknown Has patient had a PCN reaction causing severe rash involving mucus membranes or skin necrosis: No Has patient had a PCN reaction that required hospitalization: No Has patient had a PCN reaction occurring within the last 10 years: No If all of the above answers are "NO", then may proceed with Cephalosporin use.   [place] [Pseudoephedrine Hcl] Shortness Of Breath, Rash and Other (See Comments)    REACTION: Choking, redness, inability to swallow  . Crestor [Rosuvastatin] Other (See Comments)    Leg pain  . Lipitor [Atorvastatin] Other (See Comments)    Leg pain  . Vytorin [Ezetimibe-Simvastatin] Other (See Comments)    Leg pain  . Norvasc [Amlodipine] Other (See Comments)    unknown   Follow-up Information    Sharilyn Sites, MD. Schedule an appointment as soon as possible for a visit in 10 day(s).   Specialty: Family Medicine Contact information: [address] [place] [id number] [phone]        Leonie Man, MD .   Specialty: Cardiology Contact information: [address] [place] [address] [phone]           The results of significant diagnostics from this hospitalization (including imaging, microbiology, ancillary and laboratory) are listed below for reference.    Significant Diagnostic Studies: CT ANGIO CHEST PE W OR WO CONTRAST  Result Date: 12/29/2019 CLINICAL DATA:  Short of breath. COVID pneumonia. Elevated D-dimer and hypoxia. EXAM: CT ANGIOGRAPHY CHEST WITH CONTRAST TECHNIQUE: Multidetector CT imaging of the chest was performed using the standard protocol during bolus administration of intravenous contrast. Multiplanar CT  image reconstructions and MIPs were obtained to evaluate the vascular anatomy. CONTRAST:  171mL OMNIPAQUE IOHEXOL 350 MG/ML SOLN COMPARISON:  08/23/2017 FINDINGS: Cardiovascular: The main pulmonary artery and its branches are patent. There is no saddle embolus or central obstructing pulmonary artery embolus. No suspicious filling defects within the lobar or segmental pulmonary arteries. Mediastinum/Nodes: 1.9 cm low-density nodule in left lobe of thyroid gland noted. Right lobe of thyroid gland unremarkable. The trachea appears patent and is midline. Small hiatal hernia. Esophagus otherwise unremarkable. No thoracic adenopathy identified. Lungs/Pleura: Extensive, multifocal bilateral areas of ground-glass attenuation and interstitial thickening noted throughout both lungs compatible with multifocal atypical viral pneumonia. No pleural effusion or pneumothorax. No atelectasis. Upper Abdomen: Liver cysts are again identified. No acute abnormality identified. Musculoskeletal: Spondylosis within the thoracic spine. Moderate degenerative changes noted within both glenohumeral joints. Review of the MIP images confirms the above findings. IMPRESSION: 1. No evidence for  acute pulmonary embolus. 2. Multifocal bilateral areas of ground-glass attenuation and interstitial thickening compatible with multifocal atypical viral pneumonia. 3. Aortic atherosclerosis. 4. Left lobe of thyroid gland nodule. Recommend nonemergent thyroid US(ref: J Am Coll Radiol. 2015 Feb;12(2): 143-50). Aortic Atherosclerosis (ICD10-I70.0). Electronically Signed   By: Kerby Moors M.D.   On: 12/29/2019 16:12   DG CHEST PORT 1 VIEW  Result Date: 12/27/2019 CLINICAL DATA:  Increased oxygen demand. COVID positive. EXAM: PORTABLE CHEST 1 VIEW COMPARISON:  Radiograph earlier this day at 12:11 hour FINDINGS: Similar patchy bilateral lung opacities, basilar predominant. No significant change from earlier today. Unchanged heart size and mediastinal  contours. No pneumothorax or large pleural effusion. IMPRESSION: Patchy bilateral lung opacities consistent with COVID-19 pneumonia, unchanged from earlier this day. Electronically Signed   By: Keith Rake M.D.   On: 12/27/2019 23:45   DG Chest Port 1 View  Result Date: 12/27/2019 CLINICAL DATA:  COVID exposure. Shortness of breath, fever and headache. EXAM: PORTABLE CHEST 1 VIEW COMPARISON:  12/27/2016 FINDINGS: Heart size normal. No pleural effusion or edema. Bilateral lower lung zone interstitial opacities are identified, new from previous exam. The visualized osseous structures appear intact. IMPRESSION: 1. Bilateral lower lung zone interstitial opacities. Imaging findings compatible with an inflammatory or infectious process. Atypical viral pneumonia not excluded. Electronically Signed   By: Kerby Moors M.D.   On: 12/27/2019 12:26    Microbiology: Recent Results (from the past 240 hour(s))  Blood Culture (routine x 2)     Status: None   Collection Time: 12/27/19 12:27 PM   Specimen: BLOOD  Result Value Ref Range Status   Specimen Description BLOOD RIGHT ANTECUBITAL  Final   Special Requests   Final    BOTTLES DRAWN AEROBIC AND ANAEROBIC Blood Culture adequate volume   Culture   Final    NO GROWTH 5 DAYS Performed at [facility], [address], [place], [place] [address]    Report Status 01/01/2020 FINAL  Final  Blood Culture (routine x 2)     Status: None   Collection Time: 12/27/19 12:28 PM   Specimen: BLOOD  Result Value Ref Range Status   Specimen Description BLOOD LEFT ANTECUBITAL  Final   Special Requests   Final    BOTTLES DRAWN AEROBIC AND ANAEROBIC Blood Culture adequate volume   Culture   Final    NO GROWTH 5 DAYS Performed at [facility], [address] [place], [place] [address]    Report Status 01/01/2020 FINAL  Final     Labs: Basic Metabolic Panel: Recent Labs  Lab 12/28/19 0749 12/29/19 0709 12/30/19 0553 12/31/19 0504 01/01/20 0600  01/03/20 0853  NA 139 138 139 137 139  --   K 4.4 4.8 4.5 4.5 5.0  --   CL 105 103 105 105 102  --   CO2 23 24 25 23 28   --   GLUCOSE 226* 204* 211* 287* 298*  --   BUN 30* 31* 25* 21 21  --   CREATININE 0.82 0.74 0.76 0.62 0.69 0.71  CALCIUM 9.0 9.3 8.9 8.6* 9.3  --   MG 2.1 2.0 2.0 2.1 2.0  --    Liver Function Tests: Recent Labs  Lab 12/28/19 0749 12/29/19 0709 12/30/19 0553 12/31/19 0504 01/01/20 0600  AST 34 30 28 26 22   ALT 28 25 22 23 23   ALKPHOS 48 49 51 52 64  BILITOT 0.6 0.6 0.6 0.7 0.7  PROT 5.9* 5.7* 5.6* 5.2* 5.4*  ALBUMIN 3.1* 3.0*  2.9* 2.9* 2.9*   CBC: Recent Labs  Lab 12/28/19 0749 12/29/19 0709 12/30/19 0553 12/31/19 0504 01/01/20 0600  WBC 3.0* 4.4 4.4 4.8 7.7  NEUTROABS 1.6* 2.7 2.9 3.4 5.7  HGB 15.4* 14.8 15.3* 14.9 15.1*  HCT 48.3* 45.7 47.0* 45.7 45.4  MCV 90.4 88.9 89.0 89.1 88.0  PLT 144* 199 216 247 292   BNP (last 3 results) Recent Labs    12/29/19 0709  BNP 71.0   Signed:  Barton Dubois MD.  [facility] 01/03/2020, 12:02 PM

## 2020-01-03 NOTE — Progress Notes (Signed)
Nsg Discharge Note  Admit Date:  12/27/2019 Discharge date: 01/03/2020   [name] to be D/C'd home per MD order.  AVS completed.  Copy for chart, and copy for patient signed, and dated. Patient/caregiver able to verbalize understanding.  Discharge Medication: Allergies as of 01/03/2020      Reactions   Adhesive [tape] Other (See Comments)   REACTION: redness/irritation at application site. **Certain bandages/adhesives cause this reaction**   Avelox [moxifloxacin] Anaphylaxis   Bactrim [sulfamethoxazole-trimethoprim] Anaphylaxis, Shortness Of Breath, Rash, Other (See Comments)   REACTION: Choking, inability to swallow, redness   Levaquin [levofloxacin] Anaphylaxis, Shortness Of Breath, Rash, Other (See Comments)   Reaction:Choking Brand name Levaquin ok per pt   Mucinex [guaifenesin Er] Anaphylaxis, Shortness Of Breath, Rash   Penicillins Other (See Comments)   "Passed out" Has patient had a PCN reaction causing immediate rash, facial/tongue/throat swelling, SOB or lightheadedness with hypotension: Unknown Has patient had a PCN reaction causing severe rash involving mucus membranes or skin necrosis: No Has patient had a PCN reaction that required hospitalization: No Has patient had a PCN reaction occurring within the last 10 years: No If all of the above answers are "NO", then may proceed with Cephalosporin use.   Sudafed [pseudoephedrine Hcl] Shortness Of Breath, Rash, Other (See Comments)   REACTION: Choking, redness, inability to swallow   Crestor [rosuvastatin] Other (See Comments)   Leg pain   Lipitor [atorvastatin] Other (See Comments)   Leg pain   Vytorin [ezetimibe-simvastatin] Other (See Comments)   Leg pain   Norvasc [amlodipine] Other (See Comments)   unknown      Medication List    STOP taking these medications   doxycycline 100 MG capsule Commonly known as: VIBRAMYCIN   labetalol 100 MG tablet Commonly known as: NORMODYNE   ProAir HFA 108 (90 Base)  MCG/ACT inhaler Generic drug: albuterol   Spiriva Respimat 2.5 MCG/ACT Aers Generic drug: Tiotropium Bromide Monohydrate Replaced by: umeclidinium bromide 62.5 MCG/INH Aepb     TAKE these medications   ALPRAZolam 0.5 MG tablet Commonly known as: XANAX Take 0.25 mg by mouth 2 (two) times daily.   AMBULATORY NON FORMULARY MEDICATION Inject 300 mg into the skin as directed. Medication Name:Inclisiran sodium 300 mg vs placebo   busPIRone 15 MG tablet Commonly known as: BUSPAR Take 15 mg by mouth 2 (two) times daily.   CALCIUM 600 + D PO Take 1 tablet by mouth daily.   CoQ10 100 MG Caps Take 100 mg by mouth every morning.   CRANBERRY PO Take 1 capsule by mouth daily.   escitalopram 20 MG tablet Commonly known as: LEXAPRO Take 20 mg by mouth every morning.   FISH OIL PO Take 1 capsule by mouth at bedtime.   furosemide 20 MG tablet Commonly known as: LASIX TAKE ONE TABLET EVERY OTHER DAY.   HAIR/SKIN/NAILS PO Take 1 tablet by mouth at bedtime.   Ipratropium-Albuterol 20-100 MCG/ACT Aers respimat Commonly known as: COMBIVENT Inhale 1 puff into the lungs every 6 (six) hours as needed for wheezing or shortness of breath.   isosorbide mononitrate 60 MG 24 hr tablet Commonly known as: IMDUR TAKE ONE TABLET BY MOUTH ONCE DAILY.   mirtazapine 15 MG tablet Commonly known as: REMERON Take 7.5 mg by mouth at bedtime.   multivitamin tablet Take 1 tablet by mouth at bedtime.   nitroGLYCERIN 0.4 MG SL tablet Commonly known as: NITROSTAT PLACE 1 TAB UNDER TONGUE EVERY 5 MIN IF NEEDED FOR CHEST PAIN. MAY USE  3 TIMES.NO RELIEF CALL 911.   pantoprazole 40 MG tablet Commonly known as: PROTONIX TAKE ONE TABLET BY MOUTH ONCE DAILY.   potassium chloride SA 20 MEQ tablet Commonly known as: KLOR-CON TAKE 1 TABLET EVERY OTHER DAY, TAKE ON DAYS THAT YOU TAKE FUROSEMIDE.   pravastatin 40 MG tablet Commonly known as: PRAVACHOL Take 1 tablet (40 mg total) by mouth daily.    predniSONE 10 MG tablet Commonly known as: DELTASONE Take 6 tables by mouth daily on day one, and then decrease by one tablet daily until all tablets are gone. Start taking on: January 04, 2020   umeclidinium bromide 62.5 MCG/INH Aepb Commonly known as: INCRUSE ELLIPTA Inhale 1 puff into the lungs daily. Start taking on: January 04, 2020 Replaces: Spiriva Respimat 2.5 MCG/ACT Aers            Durable Medical Equipment  (From admission, onward)         Start     Ordered   01/02/20 1816  DME Oxygen  (Discharge Planning)  Once    Question Answer Comment  Length of Need 12 Months   Mode or (Route) Nasal cannula   Liters per Minute 6   Frequency Continuous (stationary and portable oxygen unit needed)   Oxygen conserving device Yes   Oxygen delivery system Gas      01/02/20 1815          Discharge Assessment: Vitals:   01/03/20 0840 01/03/20 1432  BP:  (!) 116/51  Pulse:  89  Resp:  18  Temp:  98.3 F (36.8 C)  SpO2: (!) 85% (!) 87%   Skin clean, dry and intact without evidence of skin break down, no evidence of skin tears noted. IV catheter discontinued intact. Site without signs and symptoms of complications - no redness or edema noted at insertion site, patient denies c/o pain - only slight tenderness at site.  Dressing with slight pressure applied.  D/c Instructions-Education: Discharge instructions given to patient/family with verbalized understanding. D/c education completed with patient/family including follow up instructions, medication list, d/c activities limitations if indicated, with other d/c instructions as indicated by MD - patient able to verbalize understanding, all questions fully answered. Patient instructed to return to ED, call 911, or call MD for any changes in condition.  Patient escorted via [place], and D/C home via private auto.  Zachery Conch, RN 01/03/2020 4:49 PM

## 2020-01-03 NOTE — Care Management (Signed)
    1.    [place]    [phone]     Quality rating       Patient survey rating     Compare    2.    [place]    [phone]     Quality rating       Patient survey rating     Compare    3.    [place]    [phone]     Quality rating       Patient survey rating     Compare    4.    [place]    [phone]     Quality rating       Patient survey rating     Compare          5.    [place]    [phone]     Quality rating       Patient survey rating     Compare    6.    [place]    [phone]     Quality rating       Patient survey rating     Compare    7.    [place]    [phone]     Quality rating       Patient survey rating     Compare    8.    [place]    [phone]     Quality rating       Patient survey rating     Compare    9.    [place]    [phone]     Quality rating       Patient survey rating   11  Compare    10.    [facility]    [phone]     Quality rating       Patient survey rating     Compare    11.    [place]    [phone]     Quality rating       Patient survey rating     Compare    12.    [facility] [place]    [phone]     Quality rating       Patient survey rating     Compare    13.    [facility]    [phone]     Quality rating       Patient survey rating   Not available11  Compare    14.    [facility] [place]    [phone]     Quality rating       Patient survey rating      Compare   Data last updated: October 23, 2019

## 2020-01-03 NOTE — Care Management Important Message (Signed)
Important Message  Patient Details  Name: Vickie [name] MRN: [id number] Date of Birth: 1946/01/22   Medicare Important Message Given:  Yes(Shannon, RN will deliver letter to patient due to contact precautions)     Tommy Medal 01/03/2020, 2:50 PM

## 2020-01-03 NOTE — TOC Transition Note (Signed)
Transition of Care [facility]) - CM/SW Discharge Note   Patient Details  Name: Vickie [name] MRN: [id number] Date of Birth: [birth year]  Transition of Care [facility]) CM/SW Contact:  Crislyn Willbanks, Chauncey [place], RN Phone Number: 01/03/2020, 10:49 AM   Clinical Narrative:    Patient discharging today. Qualifies for home oxygen. Recommended for Home health PT. Agreeable. No preference on providers.    Final next level of care: [place] Barriers to Discharge: Barriers Resolved   Patient Goals and CMS Choice Patient states their goals for this hospitalization and ongoing recovery are:: return hon[place] CMS Medicare.gov Compare Post Acute Care list provided to:: Patient Choice offered to / list presented to : Patient     Discharge Plan and Services                DME Arranged: Oxygen DME Agency: [employer] Date DME Agency Contacted: 01/03/20 Time DME Agency Contacted: [id number] Representative spoke with at DME Agency: [place]: PT [place]: [place]) Date [place]: 01/03/20 Time [place]: [place] Representative spoke with at [place]: [place] Determinants of Health ([place]) Interventions     Readmission Risk Interventions No flowsheet data found.

## 2020-01-04 DIAGNOSIS — M159 Polyosteoarthritis, unspecified: Secondary | ICD-10-CM | POA: Diagnosis not present

## 2020-01-04 DIAGNOSIS — U071 COVID-19: Secondary | ICD-10-CM | POA: Diagnosis not present

## 2020-01-04 DIAGNOSIS — J1282 Pneumonia due to coronavirus disease 2019: Secondary | ICD-10-CM | POA: Diagnosis not present

## 2020-01-04 DIAGNOSIS — E669 Obesity, unspecified: Secondary | ICD-10-CM | POA: Diagnosis not present

## 2020-01-04 DIAGNOSIS — E119 Type 2 diabetes mellitus without complications: Secondary | ICD-10-CM | POA: Diagnosis not present

## 2020-01-04 DIAGNOSIS — M419 Scoliosis, unspecified: Secondary | ICD-10-CM | POA: Diagnosis not present

## 2020-01-04 DIAGNOSIS — M549 Dorsalgia, unspecified: Secondary | ICD-10-CM | POA: Diagnosis not present

## 2020-01-04 DIAGNOSIS — J440 Chronic obstructive pulmonary disease with acute lower respiratory infection: Secondary | ICD-10-CM | POA: Diagnosis not present

## 2020-01-04 DIAGNOSIS — I10 Essential (primary) hypertension: Secondary | ICD-10-CM | POA: Diagnosis not present

## 2020-01-04 DIAGNOSIS — J9601 Acute respiratory failure with hypoxia: Secondary | ICD-10-CM | POA: Diagnosis not present

## 2020-01-04 DIAGNOSIS — D696 Thrombocytopenia, unspecified: Secondary | ICD-10-CM | POA: Diagnosis not present

## 2020-01-04 DIAGNOSIS — I252 Old myocardial infarction: Secondary | ICD-10-CM | POA: Diagnosis not present

## 2020-01-04 DIAGNOSIS — H3530 Unspecified macular degeneration: Secondary | ICD-10-CM | POA: Diagnosis not present

## 2020-01-04 DIAGNOSIS — I447 Left bundle-branch block, unspecified: Secondary | ICD-10-CM | POA: Diagnosis not present

## 2020-01-04 DIAGNOSIS — G8929 Other chronic pain: Secondary | ICD-10-CM | POA: Diagnosis not present

## 2020-01-04 DIAGNOSIS — I25119 Atherosclerotic heart disease of native coronary artery with unspecified angina pectoris: Secondary | ICD-10-CM | POA: Diagnosis not present

## 2020-01-07 ENCOUNTER — Telehealth: Payer: Self-pay | Admitting: Cardiology

## 2020-01-07 NOTE — Telephone Encounter (Signed)
New Message   Patient has questions about taking isosorbide mononitrate (IMDUR) 60 MG 24 hr tablet. Please call to discuss.

## 2020-01-07 NOTE — Telephone Encounter (Signed)
Reviewed discharge summary and pt is to be taking Isosorbide 60 mg every day Pt informed and verbalized understanding ./ cy

## 2020-01-09 DIAGNOSIS — Z681 Body mass index (BMI) 19 or less, adult: Secondary | ICD-10-CM | POA: Diagnosis not present

## 2020-01-09 DIAGNOSIS — U071 COVID-19: Secondary | ICD-10-CM | POA: Diagnosis not present

## 2020-01-18 ENCOUNTER — Other Ambulatory Visit: Payer: Self-pay | Admitting: Physician Assistant

## 2020-01-23 ENCOUNTER — Telehealth: Payer: Self-pay | Admitting: Cardiology

## 2020-01-23 ENCOUNTER — Other Ambulatory Visit: Payer: Self-pay | Admitting: Physician Assistant

## 2020-01-23 NOTE — Telephone Encounter (Signed)
Called pt she states that she is taking labetalol and she needs a refill. This is not on her medication list, ok to refill? Please advise

## 2020-01-23 NOTE — Telephone Encounter (Signed)
*  STAT* If patient is at the pharmacy, call can be transferred to refill team.   1. Which medications need to be refilled? (please list name of each medication and dose if known)  labetalol (NORMODYNE) 100 MG tablet   2. Which pharmacy/location (including street and city if local pharmacy) is medication to be sent to? [place]  3. Do they need a 30 day or 90 day supply? 90 day supply

## 2020-01-24 ENCOUNTER — Other Ambulatory Visit: Payer: Self-pay | Admitting: Cardiology

## 2020-01-25 NOTE — Telephone Encounter (Signed)
She was on 100 mg twice daily.  Okay to refill Glenetta Hew, MD

## 2020-01-27 HISTORY — PX: TRANSTHORACIC ECHOCARDIOGRAM: SHX275

## 2020-01-27 MED ORDER — LABETALOL HCL 100 MG PO TABS: 100.0000 mg | ORAL_TABLET | Freq: Two times a day (BID) | ORAL | 6 refills | Status: DC

## 2020-02-03 ENCOUNTER — Telehealth: Payer: Self-pay | Admitting: Cardiology

## 2020-02-03 DIAGNOSIS — D696 Thrombocytopenia, unspecified: Secondary | ICD-10-CM | POA: Diagnosis not present

## 2020-02-03 DIAGNOSIS — I25119 Atherosclerotic heart disease of native coronary artery with unspecified angina pectoris: Secondary | ICD-10-CM | POA: Diagnosis not present

## 2020-02-03 DIAGNOSIS — I252 Old myocardial infarction: Secondary | ICD-10-CM | POA: Diagnosis not present

## 2020-02-03 DIAGNOSIS — M549 Dorsalgia, unspecified: Secondary | ICD-10-CM | POA: Diagnosis not present

## 2020-02-03 DIAGNOSIS — G8929 Other chronic pain: Secondary | ICD-10-CM | POA: Diagnosis not present

## 2020-02-03 DIAGNOSIS — M159 Polyosteoarthritis, unspecified: Secondary | ICD-10-CM | POA: Diagnosis not present

## 2020-02-03 DIAGNOSIS — J440 Chronic obstructive pulmonary disease with acute lower respiratory infection: Secondary | ICD-10-CM | POA: Diagnosis not present

## 2020-02-03 DIAGNOSIS — J1282 Pneumonia due to coronavirus disease 2019: Secondary | ICD-10-CM | POA: Diagnosis not present

## 2020-02-03 DIAGNOSIS — H3530 Unspecified macular degeneration: Secondary | ICD-10-CM | POA: Diagnosis not present

## 2020-02-03 DIAGNOSIS — I447 Left bundle-branch block, unspecified: Secondary | ICD-10-CM | POA: Diagnosis not present

## 2020-02-03 DIAGNOSIS — U071 COVID-19: Secondary | ICD-10-CM | POA: Diagnosis not present

## 2020-02-03 DIAGNOSIS — E119 Type 2 diabetes mellitus without complications: Secondary | ICD-10-CM | POA: Diagnosis not present

## 2020-02-03 DIAGNOSIS — I10 Essential (primary) hypertension: Secondary | ICD-10-CM | POA: Diagnosis not present

## 2020-02-03 DIAGNOSIS — E669 Obesity, unspecified: Secondary | ICD-10-CM | POA: Diagnosis not present

## 2020-02-03 DIAGNOSIS — J9601 Acute respiratory failure with hypoxia: Secondary | ICD-10-CM | POA: Diagnosis not present

## 2020-02-03 DIAGNOSIS — M419 Scoliosis, unspecified: Secondary | ICD-10-CM | POA: Diagnosis not present

## 2020-02-03 NOTE — Progress Notes (Signed)
Cardiology Clinic Note   Patient Name: Vickie [name] Date of Encounter: 02/04/2020  Primary Care Provider:  Sharilyn Sites, MD Primary Cardiologist:  Glenetta Hew, MD  Patient Profile    Vickie [name] 74 year old female presents today for follow-up of her HTN, CAD, HLD, DOE.  Past Medical History    Past Medical History:  Diagnosis Date  . Anginal pain ([place])   . Anxiety   . Anxiety disorder    With apparent panic attacks  . Arthritis    "hands, knees, back" (09/18/2017)  . CAD S/P percutaneous coronary angioplasty 08/2017   Single-vessel CAD involving second diagonal branch treated with DES stent Synergy 2.25 mm x 12 mm (2.4 mm)  . Chronic back pain    "all over" (09/18/2017)  . COPD (chronic obstructive pulmonary disease) ([place])    PFT[facility] were done in 2008 at [facility]  . Depression   . Dyspnea   . Dysrhythmia    LBBB  . Essential hypertension   . GERD (gastroesophageal reflux disease)   . Hiatal hernia   . History of left bundle branch block (LBBB) 08/2017   Rate Related LBBB noted in cath lab  . Hyperlipidemia   . Macular degeneration    right eye  . Myocardial infarction [facility]) 2007   "medically induced"  . Nonocclusive coronary atherosclerosis of native coronary artery 2006 through 2012   multi caths 2012, 2006, [id number] ([id number] complicated by catheter-induced dissection of small nondominant RCA, patent in 2009 with no residual abnormality); Myoview August 2013: LOW RISK, normal EF. ; Echocardiogram [name] - January 2014) moderate LVH, EF 55-65%. No significant valvular disease.  . OSA (obstructive sleep apnea) 9/25/ 2012   tested 2009; tetested sleep study 07/2011--titration 09/20/2011 now use Bi-PAP  . OSA on CPAP   . Pneumonia    "several times in 2017; 3 times already this year" (09/18/2017)  . Scoliosis   . Type II diabetes mellitus ([place])    Past Surgical History:  Procedure Laterality Date  . CARDIAC CATHETERIZATION  [id number]; 2009   Non-occlusice CAD - only 80% ostial SP1;  NON DOMINANNT  RCA (catheter insuced dissection with MI in 2007 --> resolved by 2009 cath)  . CATARACT EXTRACTION, BILATERAL Bilateral 2017   Toric lens "in the left eye only"  . COLONOSCOPY WITH PROPOFOL N/A 11/09/2018   Procedure: COLONOSCOPY WITH PROPOFOL;  Surgeon: Rogene Houston, MD;  [facility];  Service: Endoscopy;  Laterality: N/A;  2:25  . CORONARY STENT INTERVENTION N/A 09/18/2017   Procedure: CORONARY STENT INTERVENTION;  Surgeon: Leonie Man, MD;  Location: [facility] INVASIVE CV LAB: DES PCI Diag2: Synergy DES 2.25 mm x 12 mm (2.4 mm)  . DIAGNOSTIC LAPAROSCOPY Right   . DILATION AND CURETTAGE OF UTERUS    . EYE SURGERY    . INTRAVASCULAR PRESSURE WIRE/FFR STUDY N/A 09/18/2017   Procedure: INTRAVASCULAR PRESSURE WIRE/FFR STUDY;  Surgeon: Leonie Man, MD;  Location: [place] CV LAB;  Service: Cardiovascular: FFR Diag 2 -- 0.78 (significcant) --> PCI  . KNEE ARTHROSCOPY Right   . LEFT HEART CATH AND CORONARY ANGIOGRAPHY N/A 09/18/2017   Procedure: LEFT HEART CATH AND CORONARY ANGIOGRAPHY;  Surgeon: Leonie Man, MD;  Location: [facility] INVASIVE CV LAB: Culpril - 80% Diag2 (FFR 0.78)- PCI.  pLAD 40%, pCx 40%. Post PCI LBBB. LVEDP - ~20 mmHg. EF 55-60%  . LIPOMA EXCISION Right ~ 2015   anterior abdomen  . PARS PLANA VITRECTOMY W/ REPAIR  OF MACULAR HOLE Right    Unsuccessful repair.  Hole filled  . POLYPECTOMY  11/09/2018   Procedure: POLYPECTOMY;  Surgeon: Rogene Houston, MD;  [facility];  Service: Endoscopy;;  colon  . VAGINAL HYSTERECTOMY      Allergies  Allergies  Allergen Reactions  . Adhesive [Tape] Other (See Comments)    REACTION: redness/irritation at application site. **Certain bandages/adhesives cause this reaction**  . Avelox [Moxifloxacin] Anaphylaxis  . Bactrim [Sulfamethoxazole-Trimethoprim] Anaphylaxis, Shortness Of Breath, Rash and Other (See Comments)    REACTION: Choking, inability to  swallow, redness  . Levaquin [Levofloxacin] Anaphylaxis, Shortness Of Breath, Rash and Other (See Comments)    Reaction:Choking Brand name Levaquin ok per pt  . Mucinex [Guaifenesin Er] Anaphylaxis, Shortness Of Breath and Rash  . Penicillins Other (See Comments)    "Passed out" Has patient had a PCN reaction causing immediate rash, facial/tongue/throat swelling, SOB or lightheadedness with hypotension: Unknown Has patient had a PCN reaction causing severe rash involving mucus membranes or skin necrosis: No Has patient had a PCN reaction that required hospitalization: No Has patient had a PCN reaction occurring within the last 10 years: No If all of the above answers are "NO", then may proceed with Cephalosporin use.   [place] [Pseudoephedrine Hcl] Shortness Of Breath, Rash and Other (See Comments)    REACTION: Choking, redness, inability to swallow  . Crestor [Rosuvastatin] Other (See Comments)    Leg pain  . Lipitor [Atorvastatin] Other (See Comments)    Leg pain  . Vytorin [Ezetimibe-Simvastatin] Other (See Comments)    Leg pain  . Norvasc [Amlodipine] Other (See Comments)    unknown    History of Present Illness    Ms. Lamond has a past medical history of coronary artery disease with PCI of her diagonal branch 08/2017.  She presented with chest pain and had an abnormal cardiac CTA with FFR.  She underwent cardiac catheterization 2006, 2007, 2009.  Her 2007 cardiac catheterization  resulted in catheter induced dissection and MI, of her nondominant RCA.  Her cardiac catheterization in 2009 showed resolved dissection.  She is noted to have chronic unusual chest pain and dyspneic episodes.  She was noted to do well until September 2018 when she presented with anginal symptoms and had a abnormal coronary CTA.  She is a participant in the chorion trial and has been switched from the placebo to treatment arm.  She also has HTN, history of acute hypoxemic respiratory failure due to COVID-19,  pneumonia due to COVID-19, GERD, hypothyroidism, anxiety, and chronic lower extremity edema.  She was last seen by Dr. Ellyn Hack on 08/2019.  During that time she was doing well.  She did have exertional dyspnea which was made worse by her facemask.  She was staying active doing yard work and daily activities.  She also indicated that she was frustrated due to weight gain because she had not been as active with the COVID-19 pandemic.  She had changed her diet and was focusing on increasing her physical activity.  She was admitted to the hospital from January 1 through January 03, 2020 with COVID-19 pneumonia, acute hypoxemia respiratory failure due to COVID-19, and essential hypertension.    She presents the clinic today and states she has noticed that her heart rate is much higher with physical activity since she has been discharged from the hospital.  She continues to be on 3 L of oxygen.  She has completed all her physical therapy and is now  doing her physical therapy exercises on her own at home.  She states she is able to complete 1-1/2 the distance of her trailer walking from end to end.  However, with this activity she has to stop for several minutes to allow her heart rate to come back down.  She continues to follow a strict diet and is 177 pounds today which is down from 180 pounds when she was admitted at the hospital in January.  I have encouraged her to maintain her physical activity, her EKG today shows sinus rhythm 73 bpm with LVH.  I will order an echocardiogram and will also have her follow-up with pulmonology.  She denies chest pain, increased shortness of breath, lower extremity edema, fatigue, melena, hematuria, hemoptysis, diaphoresis, weakness, presyncope, syncope, orthopnea, and PND.   Home Medications    Prior to Admission medications   Medication Sig Start Date End Date Taking? Authorizing Provider  ALPRAZolam Duanne Moron) 0.5 MG tablet Take 0.25 mg by mouth 2 (two) times daily.      [provider]  AMBULATORY NON FORMULARY MEDICATION Inject 300 mg into the skin as directed. Medication Name:Inclisiran sodium 300 mg vs placebo Patient not taking: Reported on 12/28/2019 02/22/17   Hillary Bow, MD  Biotin w/ Vitamins C & E (HAIR/SKIN/NAILS PO) Take 1 tablet by mouth at bedtime.    [provider]  busPIRone (BUSPAR) 15 MG tablet Take 15 mg by mouth 2 (two) times daily.    [provider]  Calcium Carb-Cholecalciferol (CALCIUM 600 + D PO) Take 1 tablet by mouth daily.     [provider]  Coenzyme Q10 (COQ10) 100 MG CAPS Take 100 mg by mouth every morning.    [provider]  CRANBERRY PO Take 1 capsule by mouth daily.     [provider]  escitalopram (LEXAPRO) 20 MG tablet Take 20 mg by mouth every morning.     [provider]  furosemide (LASIX) 20 MG tablet TAKE ONE TABLET EVERY OTHER DAY. 12/23/19   Leonie Man, MD  Ipratropium-Albuterol (COMBIVENT) 20-100 MCG/ACT AERS respimat Inhale 1 puff into the lungs every 6 (six) hours as needed for wheezing or shortness of breath. 01/03/20   Barton Dubois, MD  isosorbide mononitrate (IMDUR) 60 MG 24 hr tablet TAKE ONE TABLET BY MOUTH ONCE DAILY. 11/26/19   Leonie Man, MD  labetalol (NORMODYNE) 100 MG tablet Take 1 tablet (100 mg total) by mouth 2 (two) times daily. 01/27/20   Leonie Man, MD  mirtazapine (REMERON) 15 MG tablet Take 7.5 mg by mouth at bedtime.     [provider]  Multiple Vitamin (MULTIVITAMIN) tablet Take 1 tablet by mouth at bedtime.     [provider]  nitroGLYCERIN (NITROSTAT) 0.4 MG SL tablet PLACE 1 TAB UNDER TONGUE EVERY 5 MIN IF NEEDED FOR CHEST PAIN. MAY USE 3 TIMES.NO RELIEF CALL 911. 01/03/19   Leonie Man, MD  Omega-3 Fatty Acids (FISH OIL PO) Take 1 capsule by mouth at bedtime.    [provider]  pantoprazole (PROTONIX) 40 MG tablet TAKE ONE TABLET BY MOUTH ONCE DAILY. 05/23/19   Leonie Man,  MD  potassium chloride SA (KLOR-CON) 20 MEQ tablet TAKE 1 TABLET EVERY OTHER DAY, TAKE ON DAYS THAT YOU TAKE FUROSEMIDE. 12/23/19   Leonie Man, MD  pravastatin (PRAVACHOL) 40 MG tablet Take 1 tablet (40 mg total) by mouth daily. 12/16/19   Leonie Man, MD  predniSONE (DELTASONE) 10 MG  tablet Take 6 tables by mouth daily on day one, and then decrease by one tablet daily until all tablets are gone. 01/04/20   Barton Dubois, MD  umeclidinium bromide (INCRUSE ELLIPTA) 62.5 MCG/INH AEPB Inhale 1 puff into the lungs daily. 01/04/20   Barton Dubois, MD    Family History    Family History  Problem Relation Age of Onset  . CAD Mother   . Breast cancer Maternal Aunt   . Breast cancer Paternal Aunt    She indicated that her mother is alive. She indicated that her father is deceased. She indicated that her brother is deceased. She indicated that all of her four children are alive. She indicated that the status of her maternal aunt is unknown. She indicated that the status of her paternal aunt is unknown.  Social History    Social History   Socioeconomic History  . Marital status: Married    Spouse name: Not on file  . Number of children: Not on file  . Years of education: Not on file  . Highest education level: Not on file  Occupational History  . Not on file  Tobacco Use  . Smoking status: Former Smoker    Packs/day: 3.00    Years: 8.50    Pack years: 25.50    Types: Cigarettes    Quit date: 04/18/1976    Years since quitting: 43.8  . Smokeless tobacco: Never Used  Substance and Sexual Activity  . Alcohol use: No  . Drug use: No  . Sexual activity: Not Currently    Birth control/protection: None  Other Topics Concern  . Not on file  Social History Narrative   Married mother of 29, grandmother of 80. Her mother is 35 years old. Quit smoking 34 years ago. Does not drink alcohol.  Retired from [employer] in 2012.   Usually presents with oldest daughter.    Previously worked out at [employer] regularly walking 1/2-1 mile a day, but no longer able to do so because of the [employer] decision to no longer cover [employer] cost.   Social Determinants of Health   Financial Resource Strain:   . Difficulty of Paying Living Expenses: Not on file  Food Insecurity:   . Worried About Charity fundraiser in the Last Year: Not on file  . Ran Out of Food in the Last Year: Not on file  Transportation Needs:   . Lack of Transportation (Medical): Not on file  . Lack of Transportation (Non-Medical): Not on file  Physical Activity:   . Days of Exercise per Week: Not on file  . Minutes of Exercise per Session: Not on file  Stress:   . Feeling of Stress : Not on file  Social Connections:   . Frequency of Communication with Friends and Family: Not on file  . Frequency of Social Gatherings with Friends and Family: Not on file  . Attends Religious Services: Not on file  . Active Member of Clubs or Organizations: Not on file  . Attends Archivist Meetings: Not on file  . Marital Status: Not on file  Intimate Partner Violence:   . Fear of Current or Ex-Partner: Not on file  . Emotionally Abused: Not on file  . Physically Abused: Not on file  . Sexually Abused: Not on file     Review of Systems    General:  No chills, fever, night sweats or weight changes.  Cardiovascular:  No chest pain,  dyspnea on exertion, edema, orthopnea, palpitations, paroxysmal nocturnal dyspnea. Dermatological: No rash, lesions/masses Respiratory: No cough, dyspnea Urologic: No hematuria, dysuria Abdominal:   No nausea, vomiting, diarrhea, bright red blood per rectum, melena, or hematemesis Neurologic:  No visual changes, wkns, changes in mental status. All other systems reviewed and are otherwise negative except as noted above.  Physical Exam    VS:  BP 128/70   Pulse 73   Temp 97.7 F (36.5 C)   Ht 4\' 11"  (1.499 m)   Wt 177 lb (80.3 kg)   SpO2  93%   BMI 35.75 kg/m  , BMI Body mass index is 35.75 kg/m. GEN: Well nourished, well developed, in no acute distress. HEENT: normal. Neck: Supple, no JVD, carotid bruits, or masses. Cardiac: RRR, no murmurs, rubs, or gallops. No clubbing, cyanosis, edema.  Radials/DP/PT 2+ and equal bilaterally.  Respiratory:  Respirations regular and unlabored, clear to auscultation bilaterally. GI: Soft, nontender, nondistended, BS + x 4. MS: no deformity or atrophy. Skin: warm and dry, no rash. Neuro:  Strength and sensation are intact. Psych: Normal affect.  Accessory Clinical Findings    ECG personally reviewed by me today-normal sinus rhythm minimal voltage criteria for LVH 73 bpm- No acute changes   EKG 12/30/2019 Sinus rhythm LVH 75 bpm   Echocardiogram 08/03/2017  Study Conclusions   - Left ventricle: The cavity size was normal. Systolic function was  normal. The estimated ejection fraction was in the range of 60%  to 65%. Wall motion was normal; there were no regional wall  motion abnormalities. Doppler parameters are consistent with  abnormal left ventricular relaxation (grade 1 diastolic  dysfunction). Doppler parameters are consistent with elevated  ventricular end-diastolic filling pressure.  - Aortic valve: Trileaflet; normal thickness leaflets. There was  mild regurgitation.  - Aortic root: The aortic root was normal in size.  - Mitral valve: There was no regurgitation.  - Left atrium: The atrium was normal in size.  - Right ventricle: The cavity size was normal. Wall thickness was  normal. Systolic function was normal.  - Tricuspid valve: There was no regurgitation.  - Pulmonary arteries: Systolic pressure could not be accurately  estimated.  - Inferior vena cava: The vessel was normal in size.  - Pericardium, extracardiac: There was no pericardial effusion.   Left Heart Catheter Coronary Angiography-PCI November 18, 2017 Single vessel CAD as per CT FFR.  --> Successful FFR guided PCI of the Diag2 80% w/ Synergy DES 2.25 x 12 mm      Assessment & Plan   1.  Fatigue/tachycardia -she has noticed since requiring COVID-19 and having pneumonia but she is much more fatigued with physical activity.  She notices increased heart rate with ambulation. Continue labetalol 100 mg twice daily Continue furosemide every other day 20 mg Continue potassium chloride 20 mEq every other day Avoid triggers caffeine, chocolate, EtOH, etc. Increase physical activity as tolerated Order echocardiogram  Shortness of breath-has had increased work of breathing since COVID-19 contraction in January 2021.  Continues to use 3 L of oxygen 24 hours a day. Follow-up with pulmonology.  Essential hypertension-BP today 128/70.  Well-controlled at home. Heart healthy low-sodium diet-salty 6 given Increase physical activity as tolerated Continue labetalol 100 mg twice daily Continue isosorbide mononitrate 60 mg daily  Hyperlipidemia goal less than 70-12/27/2019: Triglycerides 94, LDL 121 02/10/2017 Continue co-Q10 100 mg daily Continue omega-3 fatty acids 1 capsule daily Continue pravastatin 40 mg daily Heart healthy low-sodium high-fiber diet Increase physical activity  as tolerated  CAD-mild chest wall pain today.  Cardiac catheterization 11/18/2017 with PCI of the second diagonal and DES x1. Continue a nitroglycerin 0.4 mg sublingual as needed Continue Imdur 60 mg daily Continue labetalol 100 mg twice daily Heart healthy low-sodium diet Increase physical activity as tolerated  Obesity (BMI 30-30 9.9)-weight today 177 pounds down from 180 pounds January 2021. Continue exercise and increase as tolerated goal 150 minutes moderate physical activity per week. Heart healthy low-sodium diet Continue weight loss  Bilateral lower extremity edema-chronic.  Generalized lower extremity edema, nonpitting today. Continue 20 mg furosemide every other day. Continue  potassium chloride 20 mEq every other day. Heart healthy low-sodium diet Lower extremity support stockings Elevate extremities when active  Disposition: Follow-up with Dr. Ellyn Hack in 3 months and pulmonary in near future.  Jossie Ng. [facility] [address] Office [phone] Fax [phone]

## 2020-02-03 NOTE — Telephone Encounter (Signed)
Pt states for the last 3 days she has felt like her heart is "running away" when she gets up moving around.  Rests for a few mins and it resolves but starts right back when she starts walking.  States she was doing fine when she first got out of the hospital but has had these issues the last few days.  Gets very SOB with these episodes.  No vitals available.  States at times she feels like her heart is going to explode.  Scheduled pt to come in and see Coletta Memos, NP tomorrow.  Advised if anything worsens head to ER.  Pt appreciative for call.

## 2020-02-03 NOTE — Telephone Encounter (Signed)
Pt c/o of Chest Pain: STAT if CP now or developed within 24 hours  1. Are you having CP right now? No   2. Are you experiencing any other symptoms (ex. SOB, nausea, vomiting, sweating)? Headache   3. How long have you been experiencing CP? 3 days   4. Is your CP continuous or coming and going? Coming and going   5. Have you taken Nitroglycerin? No   Patient is calling stating when she moves around it feels as if her heart is going to explode out of her chest. She states she has to sit down in order to get the feeling to stop. Please advise.  ?

## 2020-02-04 ENCOUNTER — Encounter: Payer: Self-pay | Admitting: General Practice

## 2020-02-04 ENCOUNTER — Ambulatory Visit: Payer: Medicare Other | Admitting: General Practice

## 2020-02-04 ENCOUNTER — Other Ambulatory Visit: Payer: Self-pay

## 2020-02-04 ENCOUNTER — Encounter (INDEPENDENT_AMBULATORY_CARE_PROVIDER_SITE_OTHER): Payer: Self-pay

## 2020-02-04 VITALS — BP 128/70 | HR 73 | Temp 97.7°F | Ht 59.0 in | Wt 177.0 lb

## 2020-02-04 DIAGNOSIS — I2510 Atherosclerotic heart disease of native coronary artery without angina pectoris: Secondary | ICD-10-CM

## 2020-02-04 DIAGNOSIS — E669 Obesity, unspecified: Secondary | ICD-10-CM | POA: Diagnosis not present

## 2020-02-04 DIAGNOSIS — R600 Localized edema: Secondary | ICD-10-CM

## 2020-02-04 DIAGNOSIS — I10 Essential (primary) hypertension: Secondary | ICD-10-CM

## 2020-02-04 DIAGNOSIS — E785 Hyperlipidemia, unspecified: Secondary | ICD-10-CM

## 2020-02-04 DIAGNOSIS — Z9861 Coronary angioplasty status: Secondary | ICD-10-CM

## 2020-02-04 DIAGNOSIS — U071 COVID-19: Secondary | ICD-10-CM | POA: Diagnosis not present

## 2020-02-04 DIAGNOSIS — R000 Tachycardia, unspecified: Secondary | ICD-10-CM

## 2020-02-04 DIAGNOSIS — R0602 Shortness of breath: Secondary | ICD-10-CM

## 2020-02-04 DIAGNOSIS — R5383 Other fatigue: Secondary | ICD-10-CM

## 2020-02-04 NOTE — Patient Instructions (Signed)
Testing/Procedures: Echocardiogram - Your physician has requested that you have an echocardiogram. Echocardiography is a painless test that uses sound waves to create images of your heart. It provides your doctor with information about the size and shape of your heart and how well your heart's chambers and valves are working. This procedure takes approximately one hour. There are no restrictions for this procedure. This will be performed at our [facility] location - [address], [address].  Special Instructions: Referral to pulmonary-Dr Sood.  Reduce your risk of getting COVID-19 With your heart disease it is especially important for people at increased risk of severe illness from COVID-19, and those who live with them, to protect themselves from getting COVID-19. The best way to protect yourself and to help reduce the spread of the virus that causes COVID-19 is to: Marland Kitchen Limit your interactions with other people as much as possible. . Take precautions to prevent getting COVID-19 when you do interact with others. If you start feeling sick and think you may have COVID-19, get in touch with your healthcare provider within 24 hours.  Follow-Up: 3 months  In Person Glenetta Hew, MD.    At [facility], you and your health needs are our priority.  As part of our continuing mission to provide you with exceptional heart care, we have created designated Provider Care Teams.  These Care Teams include your primary Cardiologist (physician) and Advanced Practice Providers (APPs -  Physician Assistants and Nurse Practitioners) who all work together to provide you with the care you need, when you need it.  Thank you for choosing [facility] at [facility]!!

## 2020-02-10 ENCOUNTER — Ambulatory Visit (HOSPITAL_COMMUNITY): Payer: Medicare Other | Attending: Cardiology

## 2020-02-10 ENCOUNTER — Encounter (INDEPENDENT_AMBULATORY_CARE_PROVIDER_SITE_OTHER): Payer: Self-pay

## 2020-02-10 ENCOUNTER — Other Ambulatory Visit: Payer: Self-pay

## 2020-02-10 DIAGNOSIS — I10 Essential (primary) hypertension: Secondary | ICD-10-CM

## 2020-02-10 DIAGNOSIS — R5383 Other fatigue: Secondary | ICD-10-CM | POA: Diagnosis not present

## 2020-02-10 DIAGNOSIS — R000 Tachycardia, unspecified: Secondary | ICD-10-CM | POA: Diagnosis not present

## 2020-02-10 MED ORDER — PERFLUTREN LIPID MICROSPHERE
1.0000 mL | INTRAVENOUS | Status: AC | PRN
  Administered 2020-02-10: 1 mL via INTRAVENOUS

## 2020-03-03 DIAGNOSIS — U071 COVID-19: Secondary | ICD-10-CM | POA: Diagnosis not present

## 2020-03-04 ENCOUNTER — Other Ambulatory Visit: Payer: Self-pay

## 2020-03-04 ENCOUNTER — Telehealth: Payer: Self-pay | Admitting: Pulmonary Disease

## 2020-03-04 ENCOUNTER — Ambulatory Visit (INDEPENDENT_AMBULATORY_CARE_PROVIDER_SITE_OTHER): Payer: Medicare Other

## 2020-03-04 ENCOUNTER — Ambulatory Visit: Payer: Medicare Other | Admitting: Pulmonary Disease

## 2020-03-04 ENCOUNTER — Encounter: Payer: Self-pay | Admitting: Pulmonary Disease

## 2020-03-04 VITALS — BP 140/82 | HR 74 | Temp 97.5°F | Ht 59.0 in | Wt 169.8 lb

## 2020-03-04 DIAGNOSIS — Z8616 Personal history of COVID-19: Secondary | ICD-10-CM

## 2020-03-04 DIAGNOSIS — J9611 Chronic respiratory failure with hypoxia: Secondary | ICD-10-CM

## 2020-03-04 DIAGNOSIS — R0600 Dyspnea, unspecified: Secondary | ICD-10-CM

## 2020-03-04 DIAGNOSIS — R0609 Other forms of dyspnea: Secondary | ICD-10-CM

## 2020-03-04 DIAGNOSIS — R079 Chest pain, unspecified: Secondary | ICD-10-CM | POA: Diagnosis not present

## 2020-03-04 NOTE — Progress Notes (Signed)
   Subjective:    Patient ID: Vickie [name], female    DOB: 1946-09-01, 74 y.o.   MRN: [id number]  HPI    Review of Systems  Constitutional: Negative for fever and unexpected weight change.  HENT: Positive for congestion. Negative for dental problem, ear pain, nosebleeds, postnasal drip, rhinorrhea, sinus pressure, sneezing, sore throat and trouble swallowing.   Eyes: Negative for redness and itching.  Respiratory: Positive for cough and shortness of breath. Negative for chest tightness and wheezing.   Cardiovascular: Negative for palpitations and leg swelling.  Gastrointestinal: Negative for nausea and vomiting.  Genitourinary: Negative for dysuria.  Musculoskeletal: Negative for joint swelling.  Skin: Negative for rash.  Allergic/Immunologic: Negative.  Negative for environmental allergies, food allergies and immunocompromised state.  Neurological: Positive for headaches.  Hematological: Does not bruise/bleed easily.  Psychiatric/Behavioral: Negative for dysphoric mood. The patient is nervous/anxious.        Objective:   Physical Exam        Assessment & Plan:

## 2020-03-04 NOTE — Telephone Encounter (Signed)
Response from [employer] re:  DME referral for POC:  Vickie [name] sent to [name], [name], [place], we are out of stock on POCs at this time with no anticipated availability date. I have forwarded this on to our DME to contact the patient to discuss.  We can always do a best fit for the patient, but would need an order for that.       Previous Messages   ----- Message -----  From: Vickie [name]  Sent: 03/04/2020  1:15 PM EST  To: [name], [name]  Subject: RE: O2/POC                    Got it.

## 2020-03-04 NOTE — Telephone Encounter (Signed)
Per Sonia Baller with [employer]:  Elon Alas sent to Albertha Ghee, Delaney Meigs, [place]  I was advised that we actually do not need an order to do a best fit as I had instructed. So we are getting that entered and scheduled for the patient.  Thank you.

## 2020-03-04 NOTE — Telephone Encounter (Signed)
I am forwarding this to Dr. Halford Chessman.  Please advise in regards to no POC units being available.

## 2020-03-04 NOTE — Patient Instructions (Addendum)
Chest xray today  Will schedule pulmonary function test  Will arrange for portable oxygen concentrator at 2 liters  Follow up in 6 weeks at either [facility] or [place] office

## 2020-03-04 NOTE — Progress Notes (Signed)
[facility]  Chief Complaint  Patient presents with  . Consult    post covid, increased sob with activity, uses cpap nightly, cpap 74 years old    Constitutional:  BP 140/82 (BP Location: Left Arm, Patient Position: Sitting, Cuff Size: Normal)   Pulse 74   Temp (!) 97.5 F (36.4 C) (Temporal)   Ht 4\' 11"  (1.499 m)   Wt 169 lb 12.8 oz (77 kg)   SpO2 93%   BMI 34.30 kg/m   Past Medical History:  CAD, COPD, HTN, HLD, OA, GERD, Depression  Brief Summary:  Vickie [name] is a 74 y.o. female with dyspnea.  She was diagnosed with COVID 19 pneumonia in January 2021.  Treated with decadron and remdesivir.    She still gets winded with activity.  Also having discomfort in her chest.  Has cough with sputum.  Phlegm is clear.  Not having skin rash, GI symptoms, fever, joint swelling, leg swelling.  Walking in office today.  SpO2 85% on room air with walking.  Recovered on 2 liters oxygen to 93% SpO2.   Physical Exam:   Appearance - well kempt   ENMT - clear nasal mucosa, midline nasal  septum, no oral exudates, no LAN, trachea midline  Respiratory - normal chest wall, normal respiratory effort, no accessory muscle use, no wheeze/rales  CV - s1s2 regular rate and rhythm, no murmurs, no peripheral edema, radial pulses symmetric  Ext - absence of several digits on each hands  Skin - no rashes, lesions, or ulcers  Psych - normal mood and affect   Assessment/Plan:   Dyspnea on exertion with atypical chest pain. - after recent COVID pneumonia - will arrange for PFT and CXR to further assess - continue incruse and prn albuterol for now  Chronic respiratory failure with hypoxia. - will need to use 2 liters oxygen with exertion and sleep  Obstructive sleep apnea. - she reports compliance with CPAP   Patient Instructions  Chest xray today  Will schedule pulmonary function test  Will arrange for portable oxygen concentrator at 2  liters  Follow up in 6 weeks at either [facility] or [place] office  Time spent 35 minutes  Chesley Mires, MD [place] Pager: [phone] 03/04/2020, 12:08 PM  Flow Sheet     Pulmonary tests:   Chest imaging:  CT angio chest 12/29/19 >> multifocal GGO  Sleep tests:    Cardiac tests:  Echo 02/10/20 >> EF 55 to 60%, grade 1 DD  Medications:   Allergies as of 03/04/2020      Reactions   Adhesive [tape] Other (See Comments)   REACTION: redness/irritation at application site. **Certain bandages/adhesives cause this reaction**   Avelox [moxifloxacin] Anaphylaxis   Bactrim [sulfamethoxazole-trimethoprim] Anaphylaxis, Shortness Of Breath, Rash, Other (See Comments)   REACTION: Choking, inability to swallow, redness   Levaquin [levofloxacin] Anaphylaxis, Shortness Of Breath, Rash, Other (See Comments)   Reaction:Choking Brand name Levaquin ok per pt   Mucinex [guaifenesin Er] Anaphylaxis, Shortness Of Breath, Rash   Penicillins Other (See Comments)   "Passed out" Has patient had a PCN reaction causing immediate rash, facial/tongue/throat swelling, SOB or lightheadedness with hypotension: Unknown Has patient had a PCN reaction causing severe rash involving mucus membranes or skin necrosis: No Has patient had a PCN reaction that required hospitalization: No Has patient had a PCN reaction occurring within the last 10 years: No If all of the above answers are "NO", then may proceed with Cephalosporin  use.   Sudafed [pseudoephedrine Hcl] Shortness Of Breath, Rash, Other (See Comments)   REACTION: Choking, redness, inability to swallow   Crestor [rosuvastatin] Other (See Comments)   Leg pain   Lipitor [atorvastatin] Other (See Comments)   Leg pain   Vytorin [ezetimibe-simvastatin] Other (See Comments)   Leg pain   Norvasc [amlodipine] Other (See Comments)   unknown      Medication List       Accurate as of March 04, 2020 12:08 PM. If you have any  questions, ask your nurse or doctor.        ALPRAZolam 0.5 MG tablet Commonly known as: XANAX Take 0.25 mg by mouth 2 (two) times daily.   AMBULATORY NON FORMULARY MEDICATION Inject 300 mg into the skin as directed. Medication Name:Inclisiran sodium 300 mg vs placebo   busPIRone 15 MG tablet Commonly known as: BUSPAR Take 15 mg by mouth 2 (two) times daily.   CALCIUM 600 + D PO Take 1 tablet by mouth daily.   CoQ10 100 MG Caps Take 100 mg by mouth every morning.   CRANBERRY PO Take 1 capsule by mouth daily.   escitalopram 20 MG tablet Commonly known as: LEXAPRO Take 20 mg by mouth every morning.   FISH OIL PO Take 1 capsule by mouth at bedtime.   furosemide 20 MG tablet Commonly known as: LASIX TAKE ONE TABLET EVERY OTHER DAY.   HAIR/SKIN/NAILS PO Take 1 tablet by mouth at bedtime.   Ipratropium-Albuterol 20-100 MCG/ACT Aers respimat Commonly known as: COMBIVENT Inhale 1 puff into the lungs every 6 (six) hours as needed for wheezing or shortness of breath.   isosorbide mononitrate 60 MG 24 hr tablet Commonly known as: IMDUR TAKE ONE TABLET BY MOUTH ONCE DAILY.   labetalol 100 MG tablet Commonly known as: NORMODYNE Take 1 tablet (100 mg total) by mouth 2 (two) times daily.   mirtazapine 15 MG tablet Commonly known as: REMERON Take 7.5 mg by mouth at bedtime.   multivitamin tablet Take 1 tablet by mouth at bedtime.   nitroGLYCERIN 0.4 MG SL tablet Commonly known as: NITROSTAT PLACE 1 TAB UNDER TONGUE EVERY 5 MIN IF NEEDED FOR CHEST PAIN. MAY USE 3 TIMES.NO RELIEF CALL 911.   pantoprazole 40 MG tablet Commonly known as: PROTONIX TAKE ONE TABLET BY MOUTH ONCE DAILY.   potassium chloride SA 20 MEQ tablet Commonly known as: KLOR-CON TAKE 1 TABLET EVERY OTHER DAY, TAKE ON DAYS THAT YOU TAKE FUROSEMIDE.   pravastatin 40 MG tablet Commonly known as: PRAVACHOL Take 1 tablet (40 mg total) by mouth daily.   umeclidinium bromide 62.5 MCG/INH Aepb Commonly  known as: INCRUSE ELLIPTA Inhale 1 puff into the lungs daily.       Past Surgical History:  She  has a past surgical history that includes Lipoma excision (Right, ~ 2015); Diagnostic laparoscopy (Right); Pars plana vitrectomy w/ repair of macular hole (Right); LEFT HEART CATH AND CORONARY ANGIOGRAPHY (N/A, 09/18/2017); INTRAVASCULAR PRESSURE WIRE/FFR STUDY (N/A, 09/18/2017); CORONARY STENT INTERVENTION (N/A, 09/18/2017); Eye surgery; Knee arthroscopy (Right); Cataract extraction, bilateral (Bilateral, 2017); Vaginal hysterectomy; Dilation and curettage of uterus; Cardiac catheterization [id number]; 2009); Colonoscopy with propofol (N/A, 11/09/2018); and polypectomy (11/09/2018).  Family History:  Her family history includes Breast cancer in her maternal aunt and paternal aunt; CAD in her mother.  Social History:  She  reports that she quit smoking about 43 years ago. Her smoking use included cigarettes. She has a 25.50 pack-year smoking history. She has never used smokeless  tobacco. She reports that she does not drink alcohol or use drugs.

## 2020-03-06 ENCOUNTER — Telehealth: Payer: Self-pay | Admitting: Pulmonary Disease

## 2020-03-06 NOTE — Telephone Encounter (Signed)
DG Chest 2 View  Result Date: 03/04/2020 CLINICAL DATA:  Chest pain and dyspnea, history of previous COVID-19 infection EXAM: CHEST - 2 VIEW COMPARISON:  12/27/2019, CT from 12/29/2019 FINDINGS: Cardiac shadow is stable. Diffuse patchy opacities are identified in both lungs slightly increased when compared with the prior plain film examination but relatively stable from the prior CT examination consistent with the prior history of COVID-19 positivity. No sizable effusion is seen. No acute bony abnormality is noted. IMPRESSION: Stable changes in both lungs consistent with the given clinical history of prior COVID-19 infection. No new focal abnormality is seen. Electronically Signed   By: Inez Catalina M.D.   On: 03/04/2020 20:30    Results d/w pt.  She has PFT schedule.  Advised her to continue with supplemental oxygen  Will need f/u CT chest in next few months.

## 2020-03-07 ENCOUNTER — Other Ambulatory Visit: Payer: Self-pay | Admitting: Cardiology

## 2020-03-26 DIAGNOSIS — E1151 Type 2 diabetes mellitus with diabetic peripheral angiopathy without gangrene: Secondary | ICD-10-CM | POA: Diagnosis not present

## 2020-03-26 DIAGNOSIS — E1140 Type 2 diabetes mellitus with diabetic neuropathy, unspecified: Secondary | ICD-10-CM | POA: Diagnosis not present

## 2020-04-03 DIAGNOSIS — U071 COVID-19: Secondary | ICD-10-CM | POA: Diagnosis not present

## 2020-04-24 DIAGNOSIS — I25119 Atherosclerotic heart disease of native coronary artery with unspecified angina pectoris: Secondary | ICD-10-CM | POA: Diagnosis not present

## 2020-04-24 DIAGNOSIS — N182 Chronic kidney disease, stage 2 (mild): Secondary | ICD-10-CM | POA: Diagnosis not present

## 2020-04-24 DIAGNOSIS — E1122 Type 2 diabetes mellitus with diabetic chronic kidney disease: Secondary | ICD-10-CM | POA: Diagnosis not present

## 2020-04-24 DIAGNOSIS — I129 Hypertensive chronic kidney disease with stage 1 through stage 4 chronic kidney disease, or unspecified chronic kidney disease: Secondary | ICD-10-CM | POA: Diagnosis not present

## 2020-04-27 ENCOUNTER — Ambulatory Visit: Payer: Medicare Other | Admitting: Cardiology

## 2020-04-27 ENCOUNTER — Other Ambulatory Visit: Payer: Self-pay

## 2020-04-27 DIAGNOSIS — I25119 Atherosclerotic heart disease of native coronary artery with unspecified angina pectoris: Secondary | ICD-10-CM | POA: Diagnosis not present

## 2020-04-27 DIAGNOSIS — E785 Hyperlipidemia, unspecified: Secondary | ICD-10-CM | POA: Diagnosis not present

## 2020-04-27 DIAGNOSIS — I2510 Atherosclerotic heart disease of native coronary artery without angina pectoris: Secondary | ICD-10-CM

## 2020-04-27 DIAGNOSIS — I10 Essential (primary) hypertension: Secondary | ICD-10-CM

## 2020-04-27 DIAGNOSIS — Z515 Encounter for palliative care: Secondary | ICD-10-CM

## 2020-04-27 DIAGNOSIS — I952 Hypotension due to drugs: Secondary | ICD-10-CM | POA: Diagnosis not present

## 2020-04-27 DIAGNOSIS — Z9861 Coronary angioplasty status: Secondary | ICD-10-CM

## 2020-04-27 DIAGNOSIS — E669 Obesity, unspecified: Secondary | ICD-10-CM

## 2020-04-27 NOTE — Progress Notes (Signed)
Primary Care Provider: Sharilyn Sites, MD Cardiologist: Glenetta Hew, MD Electrophysiologist: None  Clinic Note: Chief Complaint  Patient presents with  . Follow-up    89-month  . Coronary Artery Disease    No angina    HPI:    Vickie [name] is a 74 y.o. female with a PMH notable for single-vessel CAD -PCI Diag (08/2017) and prior history of catheter related dissection of nondominant RCA, hypertension, hyperlipidemia who presents today for 62-month follow-up.  I last saw Vickie [name] in September 2020.  Vickie [name] continues to be enrolled in the ORION 8 trial (inclisiran) for hyperlipidemia.  Vickie [name] was supposed to have had her eyelid surgery back in the spring but that was delayed because of COVID-19 lockdown.  Subsequently her daughter underwent hip surgery. ->  Vickie [name] is also doing okay but was just sedentary because of COVID-19.  Only notes exertional dyspnea because of the mask.  Stays on the go.  Vickie [name] was very very cautious about her isolation in relation to COVID-19.  Recent Hospitalizations:   January 1-8, 2021: Admitted for Covid pneumonia with acute hypoxic respiratory failure and transferred to [facility]  Treated with IV steroids and remdesivir.  After completion of remdesivir, Vickie [name] remained hypoxic but less dyspneic.  Went from 6 L high flow down to 3 L on discharge.  Hypoxia was thought to be related to underlying possible COPD  Discharge on home oxygen 3 L  Vickie [name] was last seen on February 04, 2020 by Odie Sera, NP.  This was hospital follow-up -> was still on 3 L of oxygen and had completed physical therapy.  Was doing exercise at home.  Able to walk 1-1/2 laps back-and-forth to the end of the road from her trailer.  Weight was down 3 pounds.  No angina.  No arrhythmias no heart failure symptoms.  Noted tachycardia -noted with exertion.   Reviewed  CV studies:    The following studies were reviewed today: (if available, images/films reviewed: From Epic  Chart or Care Everywhere) . 02/10/2020-Echo: EF 55 to 60%.  No R WMA.  GR 1 DD (normal for age).  Normal RV size and function.  Right atrial and right ventricular pressures..  Normal aortic mitral valve.-Essentially normal echo.   Interval History:   Vickie [name] is here today wearing hairpiece because Vickie [name] said Vickie [name] lost most of her hair due to Covid.  For the most part Vickie [name] is recovered from Covid but still is wearing oxygen, hoping that this can be turned in soon.  The coughing is notably improved and things like her taste and smell are getting better.  Her weight is still a little bit down, but her blood pressures have been a little more labile.  At home was 140/70 today.  But Vickie [name] has noted some ups and downs.  This is been since the Covid.  Vickie [name] sleeps on 2 pillows and last more because of GERD than anything else.  Vickie [name] has still has off-and-on palpitations but very rare, fleeting and no prolonged symptoms. Vickie [name] has no new symptoms of chest discomfort chest some chest wall tightening if Vickie [name] still coughs or has deep inspiration.  Vickie [name] has not had any of her pre-PCI type anginal pain.  Clearly Vickie [name] is still short of breath with exertion, this is also improving.  Minimal edema.  No arrhythmias.  CV Review of Symptoms (Summary) Cardiovascular ROS: positive for - dyspnea on exertion, shortness of breath and Less prominent tachycardia with exertion. negative  for - chest pain, edema, irregular heartbeat, orthopnea, paroxysmal nocturnal dyspnea or Syncope/near syncope, TIA/amaurosis fugax  The patient does not have symptoms concerning for COVID-19 infection (fever, chills, cough, or new shortness of breath).  The patient is practicing social distancing & Masking.   Vickie [name] is not sure whether or not Vickie [name] wants to get the COVID-19 vaccine now that Vickie [name] has had it, was told to wait 90 days.  REVIEWED OF SYSTEMS   Review of Systems  Constitutional: Positive for malaise/fatigue and weight loss.  HENT:  Negative for nosebleeds.   Respiratory: Positive for shortness of breath. Negative for cough (Much less).   Gastrointestinal: Negative for abdominal pain, blood in stool and melena.  Genitourinary: Negative for hematuria.  Musculoskeletal: Positive for joint pain. Negative for falls.  Neurological: Negative for dizziness and speech change.  Psychiatric/Behavioral: Negative for memory loss. The patient does not have insomnia.    I have reviewed and (if needed) personally updated the patient's problem list, medications, allergies, past medical and surgical history, social and family history.   PAST MEDICAL HISTORY   Past Medical History:  Diagnosis Date  . Anginal pain ([place])   . Anxiety   . Anxiety disorder    With apparent panic attacks  . Arthritis    "hands, knees, back" (09/18/2017)  . CAD S/P percutaneous coronary angioplasty 08/2017   Single-vessel CAD involving second diagonal branch treated with DES stent Synergy 2.25 mm x 12 mm (2.4 mm)  . Chronic back pain    "all over" (09/18/2017)  . COPD (chronic obstructive pulmonary disease) ([place])    PFTs were done in 2008 at [facility]  . Depression   . Dyspnea   . Dysrhythmia    LBBB  . Essential hypertension   . GERD (gastroesophageal reflux disease)   . Hiatal hernia   . History of left bundle branch block (LBBB) 08/2017   Rate Related LBBB noted in cath lab  . Hyperlipidemia   . Macular degeneration    right eye  . Myocardial infarction [facility]) 2007   "medically induced"  . Nonocclusive coronary atherosclerosis of native coronary artery 2006 through 2012   multi caths 2012, 2006, [id number] ([id number] complicated by catheter-induced dissection of small nondominant RCA, patent in 2009 with no residual abnormality); Myoview August 2013: LOW RISK, normal EF. ; Echocardiogram [name] - January 2014) moderate LVH, EF 55-65%. No significant valvular disease.  . OSA (obstructive sleep apnea) 9/25/ 2012   tested 2009; tetested sleep  study 07/2011--titration 09/20/2011 now use Bi-PAP  . OSA on CPAP   . Pneumonia    "several times in 2017; 3 times already this year" (09/18/2017)  . Scoliosis   . Type II diabetes mellitus ([place])     PAST SURGICAL HISTORY   Past Surgical History:  Procedure Laterality Date  . CARDIAC CATHETERIZATION  [id number]; 2009   Non-occlusice CAD - only 80% ostial SP1;  NON DOMINANNT  RCA (catheter insuced dissection with MI in 2007 --> resolved by 2009 cath)  . CATARACT EXTRACTION, BILATERAL Bilateral 2017   Toric lens "in the left eye only"  . COLONOSCOPY WITH PROPOFOL N/A 11/09/2018   Procedure: COLONOSCOPY WITH PROPOFOL;  Surgeon: Rogene Houston, MD;  [facility]: [facility];  Service: Endoscopy;  Laterality: N/A;  2:25  . CORONARY STENT INTERVENTION N/A 09/18/2017   Procedure: CORONARY STENT INTERVENTION;  Surgeon: Leonie Man, MD;  Location: [facility] INVASIVE CV LAB: DES PCI Diag2: Synergy DES 2.25 mm x 12 mm (  2.4 mm)  . DIAGNOSTIC LAPAROSCOPY Right   . DILATION AND CURETTAGE OF UTERUS    . EYE SURGERY    . INTRAVASCULAR PRESSURE WIRE/FFR STUDY N/A 09/18/2017   Procedure: INTRAVASCULAR PRESSURE WIRE/FFR STUDY;  Surgeon: Leonie Man, MD;  Location: [place] CV LAB;  Service: Cardiovascular: FFR Diag 2 -- 0.78 (significcant) --> PCI  . KNEE ARTHROSCOPY Right   . LEFT HEART CATH AND CORONARY ANGIOGRAPHY N/A 09/18/2017   Procedure: LEFT HEART CATH AND CORONARY ANGIOGRAPHY;  Surgeon: Leonie Man, MD;  Location: [facility] INVASIVE CV LAB: Culpril - 80% Diag2 (FFR 0.78)- PCI.  pLAD 40%, pCx 40%. Post PCI LBBB. LVEDP - ~20 mmHg. EF 55-60%  . LIPOMA EXCISION Right ~ 2015   anterior abdomen  . PARS PLANA VITRECTOMY W/ REPAIR OF MACULAR HOLE Right    Unsuccessful repair.  Hole filled  . POLYPECTOMY  11/09/2018   Procedure: POLYPECTOMY;  Surgeon: Rogene Houston, MD;  [facility];  Service: Endoscopy;;  colon  . TRANSTHORACIC ECHOCARDIOGRAM  01/2020   EF 55 to 60%.  No R WMA.  GR 1 DD  (normal for age).  Normal RV size and function.  Right atrial and right ventricular pressures..  Normal aortic mitral valve.-Essentially normal echo.  Marland Kitchen VAGINAL HYSTERECTOMY     Left Heart Catheter Coronary Angiography-PCI November 18, 2017 Single vessel CAD as per CT FFR. --> Successful FFR guided PCI of the Diag2 80% w/ Synergy DES 2.25 x 12 mm      MEDICATIONS/ALLERGIES   No outpatient medications have been marked as taking for the 04/27/20 encounter (Office Visit) with Leonie Man, MD.    Allergies  Allergen Reactions  . Adhesive [Tape] Other (See Comments)    REACTION: redness/irritation at application site. **Certain bandages/adhesives cause this reaction**  . Avelox [Moxifloxacin] Anaphylaxis  . Bactrim [Sulfamethoxazole-Trimethoprim] Anaphylaxis, Shortness Of Breath, Rash and Other (See Comments)    REACTION: Choking, inability to swallow, redness  . Levaquin [Levofloxacin] Anaphylaxis, Shortness Of Breath, Rash and Other (See Comments)    Reaction:Choking Brand name Levaquin ok per pt  . Mucinex [Guaifenesin Er] Anaphylaxis, Shortness Of Breath and Rash  . Penicillins Other (See Comments)    "Passed out" Has patient had a PCN reaction causing immediate rash, facial/tongue/throat swelling, SOB or lightheadedness with hypotension: Unknown Has patient had a PCN reaction causing severe rash involving mucus membranes or skin necrosis: No Has patient had a PCN reaction that required hospitalization: No Has patient had a PCN reaction occurring within the last 10 years: No If all of the above answers are "NO", then may proceed with Cephalosporin use.   [place] [Pseudoephedrine Hcl] Shortness Of Breath, Rash and Other (See Comments)    REACTION: Choking, redness, inability to swallow  . Crestor [Rosuvastatin] Other (See Comments)    Leg pain  . Lipitor [Atorvastatin] Other (See Comments)    Leg pain  . Vytorin [Ezetimibe-Simvastatin] Other (See Comments)    Leg  pain  . Norvasc [Amlodipine] Other (See Comments)    unknown    SOCIAL HISTORY/FAMILY HISTORY   Reviewed in Epic:  Pertinent findings: Her whole family came down with COVID-19 shortly after Christmas.  Vickie [name] was hospitalized for 7 days and her husband was hospitalized just a little bit shorter than that.  Actually her husband passed away from complications of Covid shortly after Thanksgiving.  While in the hospital, Vickie [name] was made DNI --> after discussion today, Vickie [name] has rescinded DNI request and is  full code.  CODE STATUS: FULL CODE  Social History   Social History Narrative   Married mother of 61, grandmother of 18. Her mother is 41 years old. Quit smoking 34 years ago. Does not drink alcohol.  Retired from [employer] in 2012.   Usually presents with oldest daughter.   Previously worked out at [employer] regularly walking 1/2-1 mile a day, but no longer able to do so because of the [employer] decision to no longer cover [employer] cost.      Was made DNI during her hospital stay for COVID-19 in January 2021, now that Vickie [name] has recovered from [facility] and doing well, Vickie [name] has rescinded this and is now full code.    OBJCTIVE -PE, EKG, labs   Wt Readings from Last 3 Encounters:  04/27/20 174 lb (78.9 kg)  03/04/20 169 lb 12.8 oz (77 kg)  02/04/20 177 lb (80.3 kg)    Physical Exam: BP (!) 152/80   Pulse 72   Ht 4\' 11"  (1.499 m)   Wt 174 lb (78.9 kg)   SpO2 93%   BMI 35.14 kg/m  Physical Exam  Constitutional: Vickie [name] is oriented to person, place, and time. Vickie [name] appears well-developed and well-nourished. No distress.  Healthy-appearing.  Well-groomed  HENT:  Head: Normocephalic and atraumatic.  Neck: No hepatojugular reflux and no JVD present. Carotid bruit is not present.  Cardiovascular: Normal rate, regular rhythm and intact distal pulses.  No extrasystoles are present. Exam reveals no gallop and no friction rub.  Murmur heard. High-pitched harsh  crescendo-decrescendo early systolic murmur is present with a grade of 1/6 at the upper right sternal border radiating to the neck. Pulmonary/Chest: Effort normal and breath sounds normal. No respiratory distress. Vickie [name] has no wheezes. Vickie [name] has no rales.  Musculoskeletal:        General: Deformity (Multiple finger including both thumbs several metal is not present.  Phocomelia) present. No edema. Normal range of motion.     Cervical back: Normal range of motion and neck supple.  Neurological: Vickie [name] is alert and oriented to person, place, and time.  Psychiatric: Vickie [name] has a normal mood and affect. Her behavior is normal. Judgment and thought content normal.  Vitals reviewed.   Adult ECG Report N/A   Recent Labs: 07/23/2019: TC 102, TG 94, HDL 53, LDL 23 (still on trial medicine) Lab Results  Component Value Date   CHOL 217 (H) 02/10/2017   HDL 53 02/10/2017   LDLCALC 121 (H) 02/10/2017   TRIG 94 12/27/2019   CHOLHDL 4.1 02/10/2017   Lab Results  Component Value Date   CREATININE 0.71 01/03/2020   BUN 21 01/01/2020   NA 139 01/01/2020   K 5.0 01/01/2020   CL 102 01/01/2020   CO2 28 01/01/2020   Lab Results  Component Value Date   TSH 1.721 08/20/2015    ASSESSMENT/PLAN    Problem List Items Addressed This Visit    Coronary artery disease involving native coronary artery of native heart with angina pectoris (HCC) (Chronic)    No further angina to single-vessel PCI.  On long-acting nitrate and beta-blocker.  Also on statin plus study drug.      Essential hypertension (Chronic)    Blood pressures are up a little bit today, but Vickie [name] is also had history of hypotension.  Vickie [name] will continue to monitor and we can potentially adjust.  Basically Vickie [name] can take an extra dose of labetalol if pressures are high.  Hyperlipidemia LDL goal <70 (Chronic)    Labs look amazing from last summer.  Due for follow-up this summer.  Being followed by the study coordinators. Vickie [name] is now the open label  portion of the ORION 8 trial.  I suspect that Vickie [name] is on inclisiran      Obesity (BMI 30-39.9) (Chronic)    Encouraged increase activity-diet and exercise.  Vickie [name] actually has been more active and has lost weight since having COVID-19.  Hopefully this will trigger a new trend.      CAD S/P percutaneous coronary angioplasty (Chronic)    Vickie [name] is 2 and half years out from her single-vessel PCI. -> Vickie [name] is taking aspirin every other day because of bruising.  Vickie [name] is on labetalol-despite multiple efforts to try to switch her.   On Imdur which seems to keep her from having to use nitroglycerin. Vickie [name] is on study drug plus pravastatin for lipids.      Hypotension    Has history history of low blood pressures, so therefore somewhat reluctant to push too hard with blood pressure control.      Palliative care by specialist    No longer any sense of extreme health.  At this point, for future reference, treatment is going to mean full code.  However the fact that Vickie [name] was amenable to being DNI is having an acute illness would suggest that Vickie [name] is somewhat reasonable about understanding her health.          COVID-19 Education: The signs and symptoms of COVID-19 were discussed with the patient and how to seek care for testing (follow up with PCP or arrange E-visit).   The importance of social distancing and COVID-19 vaccination was discussed today.  I spent a total of 26 minutes with the patient. >  50% of the time was spent in direct patient consultation.  Additional time spent with chart review  / charting (studies, outside notes, etc): 16 --> Hospitalization reports and follow-up clinic notes reviewed.  Echocardiogram reviewed. Total Time: 42 min   Current medicines are reviewed at length with the patient today.  (+/- concerns) none  Notice: This dictation was prepared with Dragon dictation along with smaller phrase technology. Any transcriptional errors that result from this process are  unintentional and may not be corrected upon review.  Patient Instructions / Medication Changes & Studies & Tests Ordered   Patient Instructions  Medication Instructions:  No changes *If you need a refill on your cardiac medications before your next appointment, please call your pharmacy*   Lab Work: Not needed If you have labs (blood work) drawn today and your tests are completely normal, you will receive your results only by: Marland Kitchen MyChart Message (if you have MyChart) OR . A paper copy in the mail If you have any lab test that is abnormal or we need to change your treatment, we will call you to review the results.   Testing/Procedures: Not needed   Follow-Up: At [facility], you and your health needs are our priority.  As part of our continuing mission to provide you with exceptional heart care, we have created designated Provider Care Teams.  These Care Teams include your primary Cardiologist (physician) and Advanced Practice Providers (APPs -  Physician Assistants and Nurse Practitioners) who all work together to provide you with the care you need, when you need it.  We recommend signing up for the patient portal called "MyChart".  Sign up information is provided on this After Visit Summary.  MyChart is used to connect with patients for Virtual Visits (Telemedicine).  Patients are able to view lab/test results, encounter notes, upcoming appointments, etc.  Non-urgent messages can be sent to your provider as well.   To learn more about what you can do with MyChart, go to [email or web address].    Your next appointment:   6 month(s)  The format for your next appointment:   In Person  Provider:   Glenetta Hew, MD   Other Instructions n/a    Studies Ordered:   No orders of the defined types were placed in this encounter.    Glenetta Hew, M.D., M.S. Interventional Cardiologist   Pager # [phone] Phone # [phone] [address] [place], [place] [address]   Thank you for choosing [facility] at [facility]!!

## 2020-04-27 NOTE — Patient Instructions (Signed)
Medication Instructions:  No changes *If you need a refill on your cardiac medications before your next appointment, please call your pharmacy*   Lab Work: Not needed If you have labs (blood work) drawn today and your tests are completely normal, you will receive your results only by: Marland Kitchen MyChart Message (if you have MyChart) OR . A paper copy in the mail If you have any lab test that is abnormal or we need to change your treatment, we will call you to review the results.   Testing/Procedures: Not needed   Follow-Up: At [facility], you and your health needs are our priority.  As part of our continuing mission to provide you with exceptional heart care, we have created designated Provider Care Teams.  These Care Teams include your primary Cardiologist (physician) and Advanced Practice Providers (APPs -  Physician Assistants and Nurse Practitioners) who all work together to provide you with the care you need, when you need it.  We recommend signing up for the patient portal called "MyChart".  Sign up information is provided on this After Visit Summary.  MyChart is used to connect with patients for Virtual Visits (Telemedicine).  Patients are able to view lab/test results, encounter notes, upcoming appointments, etc.  Non-urgent messages can be sent to your provider as well.   To learn more about what you can do with MyChart, go to [email or web address].    Your next appointment:   6 month(s)  The format for your next appointment:   In Person  Provider:   Glenetta Hew, MD   Other Instructions n/a

## 2020-04-29 DIAGNOSIS — Z6834 Body mass index (BMI) 34.0-34.9, adult: Secondary | ICD-10-CM | POA: Diagnosis not present

## 2020-04-29 DIAGNOSIS — E785 Hyperlipidemia, unspecified: Secondary | ICD-10-CM | POA: Diagnosis not present

## 2020-04-29 DIAGNOSIS — I10 Essential (primary) hypertension: Secondary | ICD-10-CM | POA: Diagnosis not present

## 2020-04-29 DIAGNOSIS — Z1389 Encounter for screening for other disorder: Secondary | ICD-10-CM | POA: Diagnosis not present

## 2020-04-29 DIAGNOSIS — E6609 Other obesity due to excess calories: Secondary | ICD-10-CM | POA: Diagnosis not present

## 2020-05-01 ENCOUNTER — Encounter: Payer: Self-pay | Admitting: Cardiology

## 2020-05-01 NOTE — Assessment & Plan Note (Signed)
She is 2 and half years out from her single-vessel PCI. -> She is taking aspirin every other day because of bruising.  She is on labetalol-despite multiple efforts to try to switch her.   On Imdur which seems to keep her from having to use nitroglycerin. She is on study drug plus pravastatin for lipids.

## 2020-05-01 NOTE — Assessment & Plan Note (Signed)
No longer any sense of extreme health.  At this point, for future reference, treatment is going to mean full code.  However the fact that she was amenable to being DNI is having an acute illness would suggest that she is somewhat reasonable about understanding her health.

## 2020-05-01 NOTE — Assessment & Plan Note (Signed)
No further angina to single-vessel PCI.  On long-acting nitrate and beta-blocker.  Also on statin plus study drug.

## 2020-05-01 NOTE — Assessment & Plan Note (Signed)
Labs look amazing from last summer.  Due for follow-up this summer.  Being followed by the study coordinators. She is now the open label portion of the ORION 8 trial.  I suspect that she is on inclisiran

## 2020-05-01 NOTE — Assessment & Plan Note (Signed)
Has history history of low blood pressures, so therefore somewhat reluctant to push too hard with blood pressure control.

## 2020-05-01 NOTE — Assessment & Plan Note (Signed)
Encouraged increase activity-diet and exercise.  She actually has been more active and has lost weight since having COVID-19.  Hopefully this will trigger a new trend.

## 2020-05-01 NOTE — Assessment & Plan Note (Signed)
Blood pressures are up a little bit today, but she is also had history of hypotension.  She will continue to monitor and we can potentially adjust.  Basically she can take an extra dose of labetalol if pressures are high.

## 2020-05-03 DIAGNOSIS — U071 COVID-19: Secondary | ICD-10-CM | POA: Diagnosis not present

## 2020-05-06 ENCOUNTER — Other Ambulatory Visit: Payer: Self-pay

## 2020-05-06 ENCOUNTER — Encounter: Payer: Medicare Other | Admitting: *Deleted

## 2020-05-06 VITALS — BP 164/78 | HR 70 | Temp 97.9°F | Wt 174.2 lb

## 2020-05-06 DIAGNOSIS — Z006 Encounter for examination for normal comparison and control in clinical research program: Secondary | ICD-10-CM

## 2020-05-06 NOTE — Research (Signed)
Subject to research clinic for visit day 630  in the [employer] research study.  No aes or saes to report.  Injection given and patient remained in clinic for 30 minutes post injection. Pt tolerated well and next visit scheduled

## 2020-05-18 ENCOUNTER — Other Ambulatory Visit: Payer: Self-pay | Admitting: Family Medicine

## 2020-05-18 DIAGNOSIS — Z1231 Encounter for screening mammogram for malignant neoplasm of breast: Secondary | ICD-10-CM

## 2020-05-19 ENCOUNTER — Other Ambulatory Visit (HOSPITAL_COMMUNITY)
Admission: RE | Admit: 2020-05-19 | Discharge: 2020-05-19 | Disposition: A | Discharge status: Home / Self Care | Payer: Medicare Other | Source: Ambulatory Visit | Attending: Pulmonary Disease | Admitting: Pulmonary Disease

## 2020-05-19 ENCOUNTER — Other Ambulatory Visit: Payer: Self-pay

## 2020-05-19 DIAGNOSIS — Z20822 Contact with and (suspected) exposure to covid-19: Secondary | ICD-10-CM | POA: Insufficient documentation

## 2020-05-19 DIAGNOSIS — Z01812 Encounter for preprocedural laboratory examination: Secondary | ICD-10-CM | POA: Insufficient documentation

## 2020-05-19 LAB — SARS CORONAVIRUS 2 (TAT 6-24 HRS): SARS Coronavirus 2: NEGATIVE

## 2020-05-22 ENCOUNTER — Other Ambulatory Visit: Payer: Self-pay

## 2020-05-22 ENCOUNTER — Ambulatory Visit: Payer: Medicare Other | Admitting: Pulmonary Disease

## 2020-05-22 ENCOUNTER — Encounter: Payer: Self-pay | Admitting: Pulmonary Disease

## 2020-05-22 ENCOUNTER — Ambulatory Visit (INDEPENDENT_AMBULATORY_CARE_PROVIDER_SITE_OTHER): Payer: Medicare Other | Admitting: Pulmonary Disease

## 2020-05-22 VITALS — BP 120/70 | HR 78 | Temp 98.1°F | Ht 60.0 in | Wt 175.2 lb

## 2020-05-22 DIAGNOSIS — U071 COVID-19: Secondary | ICD-10-CM

## 2020-05-22 DIAGNOSIS — R0600 Dyspnea, unspecified: Secondary | ICD-10-CM

## 2020-05-22 DIAGNOSIS — J849 Interstitial pulmonary disease, unspecified: Secondary | ICD-10-CM

## 2020-05-22 DIAGNOSIS — R0609 Other forms of dyspnea: Secondary | ICD-10-CM

## 2020-05-22 DIAGNOSIS — J1282 Pneumonia due to coronavirus disease 2019: Secondary | ICD-10-CM

## 2020-05-22 DIAGNOSIS — Z8616 Personal history of COVID-19: Secondary | ICD-10-CM

## 2020-05-22 DIAGNOSIS — R0602 Shortness of breath: Secondary | ICD-10-CM

## 2020-05-22 DIAGNOSIS — J9601 Acute respiratory failure with hypoxia: Secondary | ICD-10-CM | POA: Diagnosis not present

## 2020-05-22 LAB — PULMONARY FUNCTION TEST
DL/VA % pred: 110 %
DL/VA: 4.7 ml/min/mmHg/L
DLCO cor % pred: 76 %
DLCO cor: 12.85 ml/min/mmHg
DLCO unc % pred: 76 %
DLCO unc: 12.85 ml/min/mmHg
FEF 25-75 Post: 2 L/sec
FEF 25-75 Pre: 1.7 L/sec
FEF2575-%Change-Post: 17 %
FEF2575-%Pred-Post: 130 %
FEF2575-%Pred-Pre: 110 %
FEV1-%Change-Post: 6 %
FEV1-%Pred-Post: 79 %
FEV1-%Pred-Pre: 74 %
FEV1-Post: 1.44 L
FEV1-Pre: 1.35 L
FEV1FVC-%Change-Post: 3 %
FEV1FVC-%Pred-Pre: 113 %
FEV6-%Change-Post: 2 %
FEV6-%Pred-Post: 70 %
FEV6-%Pred-Pre: 69 %
FEV6-Post: 1.62 L
FEV6-Pre: 1.59 L
FEV6FVC-%Pred-Post: 105 %
FEV6FVC-%Pred-Pre: 105 %
FVC-%Change-Post: 2 %
FVC-%Pred-Post: 67 %
FVC-%Pred-Pre: 65 %
FVC-Post: 1.63 L
FVC-Pre: 1.59 L
Post FEV1/FVC ratio: 88 %
Post FEV6/FVC ratio: 100 %
Pre FEV1/FVC ratio: 85 %
Pre FEV6/FVC Ratio: 100 %
RV % pred: 68 %
RV: 1.41 L
TLC % pred: 75 %
TLC: 3.36 L

## 2020-05-22 MED ORDER — STIOLTO RESPIMAT 2.5-2.5 MCG/ACT IN AERS
2.0000 | INHALATION_SPRAY | Freq: Every day | RESPIRATORY_TRACT | 0 refills | Status: DC
Start: 2020-05-22 — End: 2020-08-12

## 2020-05-22 NOTE — Assessment & Plan Note (Signed)
History of COVID-19 pneumonia March/2021 chest x-ray shows potential scarring PFTs reviewed with patient today showing restriction as well as diffusion defect Patient is clinically improved as well as walk in office today showed she did not require exertional oxygen  Plan: We will obtain high-resolution CT imaging

## 2020-05-22 NOTE — Patient Instructions (Addendum)
You were seen today by Lauraine Rinne, NP  for:   1. History of COVID-19 2. Pneumonia due to COVID-19 virus  - Pulse oximetry, overnight; Future  We will order high-resolution CT chest to further evaluate  >>> Over the next 4 weeks  3. Acute respiratory failure with hypoxia (HCC)  - Pulse oximetry, overnight; Future  Walk today in office  You did not require oxygen while you are walking next  We will order a overnight pulse oximetry on room air to evaluate oxygen needs at night  Continue 2 L of O2 at night  Continue oxygen therapy as prescribed  >>>maintain oxygen saturations greater than 88 percent  >>>if unable to maintain oxygen saturations please contact the office  >>>do not smoke with oxygen  >>>can use nasal saline gel or nasal saline rinses to moisturize nose if oxygen causes dryness   4. DOE (dyspnea on exertion)  Trial of Stiolto Respimat inhaler >>>2 puffs daily >>>Take this no matter what >>>This is not a rescue inhaler  Stop Spiriva Respimat  Notify our office in 1 to 2 weeks and let us know how you are doing on Stiolto.  If you are tolerating this then we will send in a prescription.    We recommend today:  Orders Placed This Encounter  Procedures  . CT Chest High Resolution    -High-res CT with supine and prone positioning -inspiratory and expiratory cuts.  -Only to be read by Dr. Rosario Jacks and Dr. Weber Cooks.    Standing Status:   Future    Standing Expiration Date:   05/22/2021    Scheduling Instructions:     Complete over next 4 weeks or so, by 06/21/20    Order Specific Question:   Preferred imaging location?    Answer:   [facility]    Order Specific Question:   Radiology Contrast Protocol - do NOT remove file path    Answer:   [email or web address]  . Pulse oximetry, overnight    Standing Status:   Future    Standing Expiration Date:   05/22/2021    Scheduling Instructions:     On RA   Orders Placed This  Encounter  Procedures  . CT Chest High Resolution  . Pulse oximetry, overnight   No orders of the defined types were placed in this encounter.   Follow Up:    Return in about 3 months (around 08/22/2020), or if symptoms worsen or fail to improve, for Follow up with Dr. Halford Chessman, [facility].  IN [facility]  Please do your part to reduce the spread of COVID-19:      Reduce your risk of any infection  and COVID19 by using the similar precautions used for avoiding the common cold or flu:  Marland Kitchen Wash your hands often with soap and warm water for at least 20 seconds.  If soap and water are not readily available, use an alcohol-based hand sanitizer with at least 60% alcohol.  . If coughing or sneezing, cover your mouth and nose by coughing or sneezing into the elbow areas of your shirt or coat, into a tissue or into your sleeve (not your hands). Langley Gauss A MASK when in public  . Avoid shaking hands with others and consider head nods or verbal greetings only. . Avoid touching your eyes, nose, or mouth with unwashed hands.  . Avoid close contact with people who are sick. . Avoid places or events with large numbers of people in one location,  like concerts or sporting events. . If you have some symptoms but not all symptoms, continue to monitor at home and seek medical attention if your symptoms worsen. . If you are having a medical emergency, call 911.   [place] / e-Visit: [email or web address]         [facility]: [facility]: [id number]                   [facility]: [id number]     It is flu season:   >>> Best ways to protect herself from the flu: Receive the yearly flu vaccine, practice good hand hygiene washing with soap and also using hand sanitizer when available, eat a nutritious meals, get adequate rest, hydrate  appropriately   Please contact the office if your symptoms worsen or you have concerns that you are not improving.   Thank you for choosing [facility] for your healthcare, and for allowing [place] to partner with you on your healthcare journey. I am thankful to be able to provide care to you today.   Wyn Quaker FNP-C

## 2020-05-22 NOTE — Progress Notes (Signed)
@Patient  ID: Vickie [name], female    DOB: 16-Jul-1946, 74 y.o.   MRN: [id number]  Chief Complaint  Patient presents with  . Follow-up    PFT results. Wants to know if she can come off oxygen.    Referring provider: Sharilyn Sites, MD  HPI:  74 year old female former smoker followed in our office for dyspnea on exertion, obstructive sleep apnea and history of COVID-19 infection  PMH: Anxiety, hypertension, obesity, CAD, diabetes, hypothyroidism Smoker/ Smoking History: Former smoker.  Quit 1977.  25.5-pack-year history Maintenance:   Pt of: Dr. Halford Chessman  05/22/2020  - Visit   74 year old female former smoker followed in our office for dyspnea on exertion.  She was last evaluated by Dr. Halford Chessman in March/2021 as a consult.  Patient is status post COVID-19 infection.  She was set up for pulmonary function testing which she is completing today.  Those results are listed below:  05/22/2020-pulmonary function test-FVC 1.59 (65% predicted), postbronchodilator ratio 88, postbronchodilator FEV1 1.44 (79% predicted), no bronchodilator response, DLCO 12.85 (76% predicted) Flow volume loops normal  Patient is interested in stopping her oxygen.  She is unsure if she still needs it with physical exertion.  Walk today in office on room air patient did not have any oxygen desaturations or remained above 93%.  Patient is still using 2 L of O2 at night.  We will discuss how we will evaluate this.  Last chest x-ray in March/2021 showed potential COVID-19 scarring after COVID-19 pneumonia in January/2021.  Will discuss follow-up.  Patient remains adherence to Spiriva Respimat.  She is made using her rescue inhaler 1 time a week.  Questionaires / Pulmonary Flowsheets:   MMRC: mMRC Dyspnea Scale mMRC Score  05/22/2020 3    Tests:   Chest imaging:  CT angio chest 12/29/19 >> multifocal GGO  Sleep tests:    Cardiac tests:  Echo 02/10/20 >> EF 55 to 60%, grade 1 DD   12/27/2019-SARS-CoV-2-positive   03/04/2020-chest x-ray-stable changes in both lungs consistent with given clinical history of prior COVID-19 infection no new focal abnormality is seen  05/22/2020-pulmonary function test-FVC 1.59 (65% predicted), postbronchodilator ratio 88, postbronchodilator FEV1 1.44 (79% predicted), no bronchodilator response, DLCO 12.85 (76% predicted) Flow volume loops normal   FENO:  No results found for: NITRICOXIDE  PFT: PFT Results Latest Ref Rng & Units 05/22/2020  FVC-Pre L 1.59  FVC-Predicted Pre % 65  FVC-Post L 1.63  FVC-Predicted Post % 67  Pre FEV1/FVC % % 85  Post FEV1/FCV % % 88  FEV1-Pre L 1.35  FEV1-Predicted Pre % 74  FEV1-Post L 1.44  DLCO UNC% % 76  DLCO COR %Predicted % 110  TLC L 3.36  TLC % Predicted % 75  RV % Predicted % 68    WALK:  SIX MIN WALK 05/22/2020  Supplimental Oxygen during Test? (L/min) No  Tech Comments: Pt. walked one lap with no complaints of sob or chest discomfort.  She walked at a moderate pace.  Pt. did not drop below 93%.    Imaging: No results found.  Lab Results:  CBC    Component Value Date/Time   WBC 7.7 01/01/2020 0600   RBC 5.16 (H) 01/01/2020 0600   HGB 15.1 (H) 01/01/2020 0600   HCT 45.4 01/01/2020 0600   PLT 292 01/01/2020 0600   MCV 88.0 01/01/2020 0600   MCH 29.3 01/01/2020 0600   MCHC 33.3 01/01/2020 0600   RDW 12.5 01/01/2020 0600   LYMPHSABS 1.2 01/01/2020 0600  MONOABS 0.6 01/01/2020 0600   EOSABS 0.0 01/01/2020 0600   BASOSABS 0.0 01/01/2020 0600    BMET    Component Value Date/Time   NA 139 01/01/2020 0600   NA 144 10/19/2017 0917   K 5.0 01/01/2020 0600   CL 102 01/01/2020 0600   CO2 28 01/01/2020 0600   GLUCOSE 298 (H) 01/01/2020 0600   BUN 21 01/01/2020 0600   BUN 11 10/19/2017 0917   CREATININE 0.71 01/03/2020 0853   CREATININE 0.74 09/09/2016 1021   CALCIUM 9.3 01/01/2020 0600   GFRNONAA >60 01/03/2020 0853   GFRAA >60 01/03/2020 0853    BNP    Component Value Date/Time   BNP 71.0  12/29/2019 0709    ProBNP No results found for: PROBNP  Specialty Problems      Pulmonary Problems   DOE (dyspnea on exertion)   Acute hypoxemic respiratory failure due to COVID-19 ([place])   Pneumonia due to COVID-19 virus   Hypoxia   Acute respiratory failure with hypoxia (HCC)      Allergies  Allergen Reactions  . Adhesive [Tape] Other (See Comments)    REACTION: redness/irritation at application site. **Certain bandages/adhesives cause this reaction**  . Avelox [Moxifloxacin] Anaphylaxis  . Bactrim [Sulfamethoxazole-Trimethoprim] Anaphylaxis, Shortness Of Breath, Rash and Other (See Comments)    REACTION: Choking, inability to swallow, redness  . Levaquin [Levofloxacin] Anaphylaxis, Shortness Of Breath, Rash and Other (See Comments)    Reaction:Choking Brand name Levaquin ok per pt  . Mucinex [Guaifenesin Er] Anaphylaxis, Shortness Of Breath and Rash  . Penicillins Other (See Comments)    "Passed out" Has patient had a PCN reaction causing immediate rash, facial/tongue/throat swelling, SOB or lightheadedness with hypotension: Unknown Has patient had a PCN reaction causing severe rash involving mucus membranes or skin necrosis: No Has patient had a PCN reaction that required hospitalization: No Has patient had a PCN reaction occurring within the last 10 years: No If all of the above answers are "NO", then may proceed with Cephalosporin use.   [place] [Pseudoephedrine Hcl] Shortness Of Breath, Rash and Other (See Comments)    REACTION: Choking, redness, inability to swallow  . Crestor [Rosuvastatin] Other (See Comments)    Leg pain  . Lipitor [Atorvastatin] Other (See Comments)    Leg pain  . Vytorin [Ezetimibe-Simvastatin] Other (See Comments)    Leg pain  . Norvasc [Amlodipine] Other (See Comments)    unknown    Immunization History  Administered Date(s) Administered  . Influenza, High Dose Seasonal PF 10/23/2019  . Influenza,inj,Quad PF,6+ Mos 08/21/2015  .  Influenza-Unspecified 08/21/2015, 09/25/2018    Past Medical History:  Diagnosis Date  . Anginal pain ([place])   . Anxiety   . Anxiety disorder    With apparent panic attacks  . Arthritis    "hands, knees, back" (09/18/2017)  . CAD S/P percutaneous coronary angioplasty 08/2017   Single-vessel CAD involving second diagonal branch treated with DES stent Synergy 2.25 mm x 12 mm (2.4 mm)  . Chronic back pain    "all over" (09/18/2017)  . COPD (chronic obstructive pulmonary disease) ([place])    P[facility] were done in 2008 at [facility]  . Depression   . Dyspnea   . Dysrhythmia    LBBB  . Essential hypertension   . GERD (gastroesophageal reflux disease)   . Hiatal hernia   . History of left bundle branch block (LBBB) 08/2017   Rate Related LBBB noted in cath lab  . Hyperlipidemia   .  Macular degeneration    right eye  . Myocardial infarction [facility]) 2007   "medically induced"  . Nonocclusive coronary atherosclerosis of native coronary artery 2006 through 2012   multi caths 2012, 2006, [id number] ([id number] complicated by catheter-induced dissection of small nondominant RCA, patent in 2009 with no residual abnormality); Myoview August 2013: LOW RISK, normal EF. ; Echocardiogram [name] - January 2014) moderate LVH, EF 55-65%. No significant valvular disease.  . OSA (obstructive sleep apnea) 9/25/ 2012   tested 2009; tetested sleep study 07/2011--titration 09/20/2011 now use Bi-PAP  . OSA on CPAP   . Pneumonia    "several times in 2017; 3 times already this year" (09/18/2017)  . Scoliosis   . Type II diabetes mellitus (HCC)     Tobacco History: Social History   Tobacco Use  Smoking Status Former Smoker  . Packs/day: 3.00  . Years: 8.50  . Pack years: 25.50  . Types: Cigarettes  . Quit date: 04/18/1976  . Years since quitting: 44.1  Smokeless Tobacco Never Used   Counseling given: Yes   Continue to not smoke  Outpatient Encounter Medications as of 05/22/2020  Medication Sig  .  ALPRAZolam (XANAX) 0.5 MG tablet Take 0.25 mg by mouth 2 (two) times daily.   . AMBULATORY NON FORMULARY MEDICATION Inject 300 mg into the skin as directed. Medication Name:Inclisiran sodium 300 mg vs placebo  . Biotin w/ Vitamins C & E (HAIR/SKIN/NAILS PO) Take 1 tablet by mouth at bedtime.  . busPIRone (BUSPAR) 15 MG tablet Take 15 mg by mouth 2 (two) times daily.  . Calcium Carb-Cholecalciferol (CALCIUM 600 + D PO) Take 1 tablet by mouth daily.   . Coenzyme Q10 (COQ10) 100 MG CAPS Take 100 mg by mouth every morning.  Marland Kitchen CRANBERRY PO Take 1 capsule by mouth daily.   Marland Kitchen escitalopram (LEXAPRO) 20 MG tablet Take 20 mg by mouth every morning.   . furosemide (LASIX) 20 MG tablet TAKE ONE TABLET EVERY OTHER DAY.  Marland Kitchen Ipratropium-Albuterol (COMBIVENT) 20-100 MCG/ACT AERS respimat Inhale 1 puff into the lungs every 6 (six) hours as needed for wheezing or shortness of breath.  . isosorbide mononitrate (IMDUR) 60 MG 24 hr tablet TAKE ONE TABLET BY MOUTH ONCE DAILY.  Marland Kitchen labetalol (NORMODYNE) 100 MG tablet Take 1 tablet (100 mg total) by mouth 2 (two) times daily.  . mirtazapine (REMERON) 15 MG tablet Take 7.5 mg by mouth at bedtime.   . Multiple Vitamin (MULTIVITAMIN) tablet Take 1 tablet by mouth at bedtime.   . nitroGLYCERIN (NITROSTAT) 0.4 MG SL tablet PLACE 1 TAB UNDER TONGUE EVERY 5 MIN IF NEEDED FOR CHEST PAIN. MAY USE 3 TIMES.NO RELIEF CALL 911.  . Omega-3 Fatty Acids (FISH OIL PO) Take 1 capsule by mouth at bedtime.  . pantoprazole (PROTONIX) 40 MG tablet TAKE ONE TABLET BY MOUTH ONCE DAILY.  Marland Kitchen potassium chloride SA (KLOR-CON) 20 MEQ tablet TAKE 1 TABLET EVERY OTHER DAY, TAKE ON DAYS THAT YOU TAKE FUROSEMIDE.  . pravastatin (PRAVACHOL) 40 MG tablet Take 1 tablet (40 mg total) by mouth daily.  Marland Kitchen SPIRIVA RESPIMAT 2.5 MCG/ACT AERS SMARTSIG:2 Puff(s) Via Inhaler Daily  . Tiotropium Bromide-Olodaterol (STIOLTO RESPIMAT) 2.5-2.5 MCG/ACT AERS Inhale 2 puffs into the lungs daily.  Marland Kitchen umeclidinium bromide  (INCRUSE ELLIPTA) 62.5 MCG/INH AEPB Inhale 1 puff into the lungs daily. (Patient not taking: Reported on 05/22/2020)   No facility-administered encounter medications on file as of 05/22/2020.     Review of Systems  Review of Systems  Constitutional: Positive for fatigue. Negative for activity change and fever.  HENT: Negative for sinus pressure, sinus pain and sore throat.   Respiratory: Positive for shortness of breath. Negative for cough and wheezing.   Cardiovascular: Negative for chest pain and palpitations.  Gastrointestinal: Negative for diarrhea, nausea and vomiting.  Musculoskeletal: Negative for arthralgias.  Neurological: Negative for dizziness.  Psychiatric/Behavioral: Negative for sleep disturbance. The patient is not nervous/anxious.      Physical Exam  BP 120/70 (BP Location: Left Arm, Cuff Size: Normal)   Pulse 78   Temp 98.1 F (36.7 C) (Oral)   Ht 5' (1.524 m)   Wt 175 lb 3.2 oz (79.5 kg)   SpO2 93% Comment: 2L  BMI 34.22 kg/m   Wt Readings from Last 5 Encounters:  05/22/20 175 lb 3.2 oz (79.5 kg)  05/06/20 174 lb 3.2 oz (79 kg)  04/27/20 174 lb (78.9 kg)  03/04/20 169 lb 12.8 oz (77 kg)  02/04/20 177 lb (80.3 kg)    BMI Readings from Last 5 Encounters:  05/22/20 34.22 kg/m  05/06/20 35.18 kg/m  04/27/20 35.14 kg/m  03/04/20 34.30 kg/m  02/04/20 35.75 kg/m     Physical Exam Vitals and nursing note reviewed.  Constitutional:      General: She is not in acute distress.    Appearance: Normal appearance. She is obese.  HENT:     Head: Normocephalic and atraumatic.     Right Ear: Tympanic membrane, ear canal and external ear normal. There is no impacted cerumen.     Left Ear: Tympanic membrane, ear canal and external ear normal. There is no impacted cerumen.     Nose: Nose normal. No congestion or rhinorrhea.     Mouth/Throat:     Mouth: Mucous membranes are dry.     Pharynx: Oropharynx is clear.  Eyes:     Pupils: Pupils are equal, round,  and reactive to light.  Cardiovascular:     Rate and Rhythm: Normal rate and regular rhythm.     Pulses: Normal pulses.     Heart sounds: Normal heart sounds. No murmur.  Pulmonary:     Effort: Pulmonary effort is normal. No respiratory distress.     Breath sounds: Normal breath sounds. No decreased air movement. No decreased breath sounds, wheezing or rales.  Musculoskeletal:     Cervical back: Normal range of motion.  Skin:    General: Skin is warm and dry.     Capillary Refill: Capillary refill takes less than 2 seconds.  Neurological:     General: No focal deficit present.     Mental Status: She is alert and oriented to person, place, and time. Mental status is at baseline.     Gait: Gait normal.  Psychiatric:        Mood and Affect: Mood normal.        Behavior: Behavior normal.        Thought Content: Thought content normal.        Judgment: Judgment normal.       Assessment & Plan:   Pneumonia due to COVID-19 virus History of COVID-19 pneumonia March/2021 chest x-ray shows potential scarring PFTs reviewed with patient today showing restriction as well as diffusion defect Patient is clinically improved as well as walk in office today showed she did not require exertional oxygen  Plan: We will obtain high-resolution CT imaging  Acute respiratory failure with hypoxia (HCC) Currently using 2 L of O2 with physical exertion and 2 L  of O2 at night Walk today in office reveals the patient did not require oxygen to maintain oxygen saturations above 88%  Plan: We will order overnight oximetry test on room air to evaluate oxygen needs at night Okay to stop exertional oxygen use at this time We will order high-resolution CT imaging  COVID-19 History of COVID-19 infection in January/2021 Patient is slowly improving PFTs reviewed today show restriction as well as diffusion defect Former smoker  Plan: High-resolution CT imaging ordered    Return in about 3 months  (around 08/22/2020), or if symptoms worsen or fail to improve, for Follow up with Dr. Halford Chessman, [facility].   Lauraine Rinne, NP 05/22/2020   This appointment required 35 minutes of patient care (this includes precharting, chart review, review of results, face-to-face care, etc.).

## 2020-05-22 NOTE — Assessment & Plan Note (Signed)
History of COVID-19 infection in January/2021 Patient is slowly improving PFTs reviewed today show restriction as well as diffusion defect Former smoker  Plan: High-resolution CT imaging ordered

## 2020-05-22 NOTE — Progress Notes (Signed)
PFT done today. 

## 2020-05-22 NOTE — Assessment & Plan Note (Signed)
Currently using 2 L of O2 with physical exertion and 2 L of O2 at night Walk today in office reveals the patient did not require oxygen to maintain oxygen saturations above 88%  Plan: We will order overnight oximetry test on room air to evaluate oxygen needs at night Okay to stop exertional oxygen use at this time We will order high-resolution CT imaging

## 2020-05-25 DIAGNOSIS — I129 Hypertensive chronic kidney disease with stage 1 through stage 4 chronic kidney disease, or unspecified chronic kidney disease: Secondary | ICD-10-CM | POA: Diagnosis not present

## 2020-05-25 DIAGNOSIS — I25119 Atherosclerotic heart disease of native coronary artery with unspecified angina pectoris: Secondary | ICD-10-CM | POA: Diagnosis not present

## 2020-05-25 DIAGNOSIS — N182 Chronic kidney disease, stage 2 (mild): Secondary | ICD-10-CM | POA: Diagnosis not present

## 2020-05-25 DIAGNOSIS — E1122 Type 2 diabetes mellitus with diabetic chronic kidney disease: Secondary | ICD-10-CM | POA: Diagnosis not present

## 2020-05-30 ENCOUNTER — Other Ambulatory Visit: Payer: Self-pay | Admitting: Cardiology

## 2020-06-01 NOTE — Telephone Encounter (Signed)
Rx request sent to pharmacy.  

## 2020-06-03 DIAGNOSIS — U071 COVID-19: Secondary | ICD-10-CM | POA: Diagnosis not present

## 2020-06-09 DIAGNOSIS — H524 Presbyopia: Secondary | ICD-10-CM | POA: Diagnosis not present

## 2020-06-09 DIAGNOSIS — E119 Type 2 diabetes mellitus without complications: Secondary | ICD-10-CM | POA: Diagnosis not present

## 2020-06-09 DIAGNOSIS — Z961 Presence of intraocular lens: Secondary | ICD-10-CM | POA: Diagnosis not present

## 2020-06-09 DIAGNOSIS — H35341 Macular cyst, hole, or pseudohole, right eye: Secondary | ICD-10-CM | POA: Diagnosis not present

## 2020-06-11 DIAGNOSIS — E1151 Type 2 diabetes mellitus with diabetic peripheral angiopathy without gangrene: Secondary | ICD-10-CM | POA: Diagnosis not present

## 2020-06-11 DIAGNOSIS — E1140 Type 2 diabetes mellitus with diabetic neuropathy, unspecified: Secondary | ICD-10-CM | POA: Diagnosis not present

## 2020-06-15 ENCOUNTER — Other Ambulatory Visit: Payer: Self-pay

## 2020-06-15 ENCOUNTER — Ambulatory Visit (HOSPITAL_COMMUNITY)
Admission: RE | Admit: 2020-06-15 | Discharge: 2020-06-15 | Disposition: A | Discharge status: Home / Self Care | Payer: Medicare Other | Source: Ambulatory Visit | Attending: Pulmonary Disease | Admitting: Pulmonary Disease

## 2020-06-15 DIAGNOSIS — U071 COVID-19: Secondary | ICD-10-CM | POA: Insufficient documentation

## 2020-06-15 DIAGNOSIS — J1282 Pneumonia due to coronavirus disease 2019: Secondary | ICD-10-CM | POA: Diagnosis not present

## 2020-06-15 DIAGNOSIS — R0602 Shortness of breath: Secondary | ICD-10-CM | POA: Diagnosis not present

## 2020-06-15 DIAGNOSIS — I700 Atherosclerosis of aorta: Secondary | ICD-10-CM | POA: Diagnosis not present

## 2020-06-15 DIAGNOSIS — J849 Interstitial pulmonary disease, unspecified: Secondary | ICD-10-CM

## 2020-06-15 DIAGNOSIS — Z8616 Personal history of COVID-19: Secondary | ICD-10-CM | POA: Diagnosis not present

## 2020-06-24 DIAGNOSIS — J4530 Mild persistent asthma, uncomplicated: Secondary | ICD-10-CM | POA: Diagnosis not present

## 2020-06-24 DIAGNOSIS — E1122 Type 2 diabetes mellitus with diabetic chronic kidney disease: Secondary | ICD-10-CM | POA: Diagnosis not present

## 2020-06-24 DIAGNOSIS — I129 Hypertensive chronic kidney disease with stage 1 through stage 4 chronic kidney disease, or unspecified chronic kidney disease: Secondary | ICD-10-CM | POA: Diagnosis not present

## 2020-06-24 DIAGNOSIS — N182 Chronic kidney disease, stage 2 (mild): Secondary | ICD-10-CM | POA: Diagnosis not present

## 2020-06-25 ENCOUNTER — Ambulatory Visit
Admission: RE | Admit: 2020-06-25 | Discharge: 2020-06-25 | Disposition: A | Discharge status: Home / Self Care | Payer: Medicare Other | Source: Ambulatory Visit | Attending: Family Medicine | Admitting: Family Medicine

## 2020-06-25 ENCOUNTER — Other Ambulatory Visit: Payer: Self-pay

## 2020-06-25 DIAGNOSIS — Z1231 Encounter for screening mammogram for malignant neoplasm of breast: Secondary | ICD-10-CM | POA: Diagnosis not present

## 2020-07-03 DIAGNOSIS — U071 COVID-19: Secondary | ICD-10-CM | POA: Diagnosis not present

## 2020-07-24 DIAGNOSIS — I129 Hypertensive chronic kidney disease with stage 1 through stage 4 chronic kidney disease, or unspecified chronic kidney disease: Secondary | ICD-10-CM | POA: Diagnosis not present

## 2020-07-24 DIAGNOSIS — E6609 Other obesity due to excess calories: Secondary | ICD-10-CM | POA: Diagnosis not present

## 2020-07-24 DIAGNOSIS — R7309 Other abnormal glucose: Secondary | ICD-10-CM | POA: Diagnosis not present

## 2020-07-24 DIAGNOSIS — E1122 Type 2 diabetes mellitus with diabetic chronic kidney disease: Secondary | ICD-10-CM | POA: Diagnosis not present

## 2020-07-24 DIAGNOSIS — J4530 Mild persistent asthma, uncomplicated: Secondary | ICD-10-CM | POA: Diagnosis not present

## 2020-07-24 DIAGNOSIS — Z6835 Body mass index (BMI) 35.0-35.9, adult: Secondary | ICD-10-CM | POA: Diagnosis not present

## 2020-07-24 DIAGNOSIS — Z0001 Encounter for general adult medical examination with abnormal findings: Secondary | ICD-10-CM | POA: Diagnosis not present

## 2020-07-24 DIAGNOSIS — N182 Chronic kidney disease, stage 2 (mild): Secondary | ICD-10-CM | POA: Diagnosis not present

## 2020-08-03 DIAGNOSIS — U071 COVID-19: Secondary | ICD-10-CM | POA: Diagnosis not present

## 2020-08-12 ENCOUNTER — Other Ambulatory Visit: Payer: Self-pay

## 2020-08-12 ENCOUNTER — Encounter: Payer: Self-pay | Admitting: Pulmonary Disease

## 2020-08-12 ENCOUNTER — Ambulatory Visit (INDEPENDENT_AMBULATORY_CARE_PROVIDER_SITE_OTHER): Payer: Medicare Other | Admitting: Pulmonary Disease

## 2020-08-12 VITALS — BP 136/78 | HR 58 | Temp 97.0°F | Ht 60.0 in | Wt 176.4 lb

## 2020-08-12 DIAGNOSIS — G4733 Obstructive sleep apnea (adult) (pediatric): Secondary | ICD-10-CM | POA: Diagnosis not present

## 2020-08-12 DIAGNOSIS — J849 Interstitial pulmonary disease, unspecified: Secondary | ICD-10-CM | POA: Diagnosis not present

## 2020-08-12 DIAGNOSIS — Z8616 Personal history of COVID-19: Secondary | ICD-10-CM

## 2020-08-12 MED ORDER — SPIRIVA RESPIMAT 2.5 MCG/ACT IN AERS: 2.0000 | INHALATION_SPRAY | Freq: Every day | RESPIRATORY_TRACT | 5 refills | Status: DC

## 2020-08-12 NOTE — Patient Instructions (Signed)
Will arrange for home sleep study and schedule follow up after results are available

## 2020-08-12 NOTE — Progress Notes (Signed)
[facility]  Chief Complaint  Patient presents with  . Follow-up    productive cough with thick, off white colored phlegm    Constitutional:  BP 136/78 (BP Location: Left Arm, Cuff Size: Normal)   Pulse (!) 58   Temp (!) 97 F (36.1 C) (Other (Comment)) Comment (Src): Wrist  Ht 5' (1.524 m)   Wt 176 lb 6.4 oz (80 kg)   SpO2 94% Comment: Room air  BMI 34.45 kg/m   Past Medical History:  CAD, COPD, HTN, HLD, OA, GERD, Depression  Brief Summary:  Vickie [name] is a 74 y.o. female former smoker with dyspnea after COVID 19 pneumonia in January 2021, and obstructive sleep apnea.  Subjective:   Saw Wyn Quaker in May.  Exertional SpO2 was okay.  PFT from May showed mild restriction and diffusion defect.  HRCT chest from June showed changes of post-COVID ILD.  She has not been using CPAP.  She is still using oxygen at night.  She is hoping to get rid of oxygen set up.  She has cough with clear sputum.  This was better when she was on spiriva.  She got jitters and chest irritation when on stiolto.  No having fever, sweats, leg swelling.  She lost her hair after COVID, but this is growing back in.  She gets a wave feeling across her chest occasional.  Last few seconds and then goes away.  Physical Exam:   Appearance - well kempt   ENMT - no sinus tenderness, no oral exudate, no LAN, Mallampati 3 airway, no stridor, poor dentition  Respiratory - equal breath sounds bilaterally, no wheezing or rales  CV - s1s2 regular rate and rhythm, no murmurs  Ext - no clubbing, no edema  Skin - no rashes  Psych - normal mood and affect   Assessment/Plan:   ILD after COVID 19 infection in January 2021. - refill spiriva - stiolto caused chest discomfort - will need f/u PFT and CT chest to monitor for disease progression  Chronic respiratory failure with hypoxia. - continue 2 liters oxygen at night for now  Obstructive sleep apnea. -  will arrange for home sleep study to assess current status >> advised her to not use her supplemental oxygen on the night she does home sleep study  A total of  33 minutes spent addressing patient care issues on day of visit.   Follow up:    Patient Instructions  Will arrange for home sleep study and schedule follow up after results are available   Signature:  Chesley Mires, MD [place] Pager: [phone] 08/12/2020, 11:32 AM  Flow Sheet     Pulmonary tests:   PFT 05/22/20 >> FEV1 1.44 (79%), FEV1% 88, TLC 3.36 (75%), DLCO 76%  Chest imaging:   CT angio chest 12/29/19 >> multifocal GGO  HRCT chest 06/15/20 >> atherosclerosis, prominent patchy peribronchovascular and subpleural reticulation, minimal GGO, mild/mod traction BTX  Sleep tests:    Cardiac tests:   Echo 02/10/20 >> EF 55 to 60%, grade 1 DD  Medications:   Allergies as of 08/12/2020      Reactions   Adhesive [tape] Other (See Comments)   REACTION: redness/irritation at application site. **Certain bandages/adhesives cause this reaction**   Avelox [moxifloxacin] Anaphylaxis   Bactrim [sulfamethoxazole-trimethoprim] Anaphylaxis, Shortness Of Breath, Rash, Other (See Comments)   REACTION: Choking, inability to swallow, redness   Levaquin [levofloxacin] Anaphylaxis, Shortness Of Breath, Rash, Other (See Comments)   Reaction:Choking  Brand name Levaquin ok per pt   Mucinex [guaifenesin Er] Anaphylaxis, Shortness Of Breath, Rash   Penicillins Other (See Comments)   "Passed out" Has patient had a PCN reaction causing immediate rash, facial/tongue/throat swelling, SOB or lightheadedness with hypotension: Unknown Has patient had a PCN reaction causing severe rash involving mucus membranes or skin necrosis: No Has patient had a PCN reaction that required hospitalization: No Has patient had a PCN reaction occurring within the last 10 years: No If all of the above answers are "NO", then may proceed  with Cephalosporin use.   Sudafed [pseudoephedrine Hcl] Shortness Of Breath, Rash, Other (See Comments)   REACTION: Choking, redness, inability to swallow   Crestor [rosuvastatin] Other (See Comments)   Leg pain   Lipitor [atorvastatin] Other (See Comments)   Leg pain   Vytorin [ezetimibe-simvastatin] Other (See Comments)   Leg pain   Norvasc [amlodipine] Other (See Comments)   unknown   Peanut-containing Drug Products    Shrimp [shellfish Allergy]       Medication List       Accurate as of August 12, 2020 11:32 AM. If you have any questions, ask your nurse or doctor.        STOP taking these medications   Stiolto Respimat 2.5-2.5 MCG/ACT Aers Generic drug: Tiotropium Bromide-Olodaterol Stopped by: Chesley Mires, MD   umeclidinium bromide 62.5 MCG/INH Aepb Commonly known as: INCRUSE ELLIPTA Stopped by: Chesley Mires, MD     TAKE these medications   ALPRAZolam 0.5 MG tablet Commonly known as: XANAX Take 0.25 mg by mouth 2 (two) times daily.   AMBULATORY NON FORMULARY MEDICATION Inject 300 mg into the skin as directed. Medication Name:Inclisiran sodium 300 mg vs placebo   busPIRone 15 MG tablet Commonly known as: BUSPAR Take 15 mg by mouth 2 (two) times daily.   CALCIUM 600 + D PO Take 1 tablet by mouth daily.   CoQ10 100 MG Caps Take 100 mg by mouth every morning.   CRANBERRY PO Take 1 capsule by mouth daily.   escitalopram 20 MG tablet Commonly known as: LEXAPRO Take 20 mg by mouth every morning.   FISH OIL PO Take 1 capsule by mouth at bedtime.   furosemide 20 MG tablet Commonly known as: LASIX TAKE ONE TABLET EVERY OTHER DAY.   HAIR/SKIN/NAILS PO Take 1 tablet by mouth at bedtime.   Ipratropium-Albuterol 20-100 MCG/ACT Aers respimat Commonly known as: COMBIVENT Inhale 1 puff into the lungs every 6 (six) hours as needed for wheezing or shortness of breath.   isosorbide mononitrate 60 MG 24 hr tablet Commonly known as: IMDUR TAKE ONE TABLET BY  MOUTH ONCE DAILY.   labetalol 100 MG tablet Commonly known as: NORMODYNE Take 1 tablet (100 mg total) by mouth 2 (two) times daily.   mirtazapine 15 MG tablet Commonly known as: REMERON Take 7.5 mg by mouth at bedtime.   multivitamin tablet Take 1 tablet by mouth at bedtime.   nitroGLYCERIN 0.4 MG SL tablet Commonly known as: NITROSTAT PLACE 1 TAB UNDER TONGUE EVERY 5 MIN IF NEEDED FOR CHEST PAIN. MAY USE 3 TIMES.NO RELIEF CALL 911.   pantoprazole 40 MG tablet Commonly known as: PROTONIX TAKE ONE TABLET BY MOUTH ONCE DAILY.   potassium chloride SA 20 MEQ tablet Commonly known as: KLOR-CON TAKE 1 TABLET EVERY OTHER DAY, TAKE ON DAYS THAT YOU TAKE FUROSEMIDE.   pravastatin 40 MG tablet Commonly known as: PRAVACHOL Take 1 tablet (40 mg total) by mouth daily.   Spiriva  Respimat 2.5 MCG/ACT Aers Generic drug: Tiotropium Bromide Monohydrate Inhale 2 puffs into the lungs daily. What changed: See the new instructions. Changed by: Chesley Mires, MD       Past Surgical History:  She  has a past surgical history that includes Lipoma excision (Right, ~ 2015); Diagnostic laparoscopy (Right); Pars plana vitrectomy w/ repair of macular hole (Right); LEFT HEART CATH AND CORONARY ANGIOGRAPHY (N/A, 09/18/2017); INTRAVASCULAR PRESSURE WIRE/FFR STUDY (N/A, 09/18/2017); CORONARY STENT INTERVENTION (N/A, 09/18/2017); Eye surgery; Knee arthroscopy (Right); Cataract extraction, bilateral (Bilateral, 2017); Vaginal hysterectomy; Dilation and curettage of uterus; Cardiac catheterization (4917,9150; 2009); Colonoscopy with propofol (N/A, 11/09/2018); polypectomy (11/09/2018); and transthoracic echocardiogram (01/2020).  Family History:  Her family history includes Breast cancer in her maternal aunt and paternal aunt; CAD in her mother.  Social History:  She  reports that she quit smoking about 44 years ago. Her smoking use included cigarettes. She has a 25.50 pack-year smoking history. She has never  used smokeless tobacco. She reports that she does not drink alcohol and does not use drugs.

## 2020-08-20 DIAGNOSIS — E1140 Type 2 diabetes mellitus with diabetic neuropathy, unspecified: Secondary | ICD-10-CM | POA: Diagnosis not present

## 2020-08-20 DIAGNOSIS — E1151 Type 2 diabetes mellitus with diabetic peripheral angiopathy without gangrene: Secondary | ICD-10-CM | POA: Diagnosis not present

## 2020-08-26 ENCOUNTER — Other Ambulatory Visit: Payer: Self-pay

## 2020-08-26 ENCOUNTER — Ambulatory Visit: Payer: Medicare Other

## 2020-08-26 DIAGNOSIS — G4733 Obstructive sleep apnea (adult) (pediatric): Secondary | ICD-10-CM

## 2020-09-01 ENCOUNTER — Telehealth: Payer: Self-pay | Admitting: Pulmonary Disease

## 2020-09-01 ENCOUNTER — Other Ambulatory Visit: Payer: Self-pay

## 2020-09-01 ENCOUNTER — Ambulatory Visit: Payer: Medicare Other | Admitting: Cardiology

## 2020-09-01 ENCOUNTER — Encounter: Payer: Self-pay | Admitting: Cardiology

## 2020-09-01 VITALS — BP 130/74 | HR 65 | Ht 60.0 in | Wt 179.8 lb

## 2020-09-01 DIAGNOSIS — I2510 Atherosclerotic heart disease of native coronary artery without angina pectoris: Secondary | ICD-10-CM

## 2020-09-01 DIAGNOSIS — I25119 Atherosclerotic heart disease of native coronary artery with unspecified angina pectoris: Secondary | ICD-10-CM

## 2020-09-01 DIAGNOSIS — E785 Hyperlipidemia, unspecified: Secondary | ICD-10-CM

## 2020-09-01 DIAGNOSIS — R600 Localized edema: Secondary | ICD-10-CM

## 2020-09-01 DIAGNOSIS — R0789 Other chest pain: Secondary | ICD-10-CM | POA: Insufficient documentation

## 2020-09-01 DIAGNOSIS — I10 Essential (primary) hypertension: Secondary | ICD-10-CM | POA: Diagnosis not present

## 2020-09-01 DIAGNOSIS — R0600 Dyspnea, unspecified: Secondary | ICD-10-CM

## 2020-09-01 DIAGNOSIS — G4733 Obstructive sleep apnea (adult) (pediatric): Secondary | ICD-10-CM | POA: Diagnosis not present

## 2020-09-01 DIAGNOSIS — E669 Obesity, unspecified: Secondary | ICD-10-CM | POA: Diagnosis not present

## 2020-09-01 DIAGNOSIS — R0609 Other forms of dyspnea: Secondary | ICD-10-CM

## 2020-09-01 DIAGNOSIS — Z9861 Coronary angioplasty status: Secondary | ICD-10-CM

## 2020-09-01 MED ORDER — PANTOPRAZOLE SODIUM 40 MG PO TBEC: 40.0000 mg | DELAYED_RELEASE_TABLET | Freq: Every day | ORAL | 3 refills | Status: DC

## 2020-09-01 NOTE — Progress Notes (Signed)
Primary Care Provider: Sharilyn Sites, MD Cardiologist: Glenetta Hew, MD Electrophysiologist: None  Clinic Note: Chief Complaint  Patient presents with  . Follow-up    Slowly getting better  . Coronary Artery Disease    No anginal type symptoms.    HPI:    Vickie [name] is a 74 y.o. female with a PMH notable for single-vessel CAD -PCI Diag (08/2017 & prior h/o Non-dom RCA Catheter related Dissection), HTN, & HLD (enrolled in ORION 8 Trial), COPD and OSA, as well as h/o COVID 19 Infection (Jan 2021),who presents today for ~58-month follow-up.  As of last Sept - she noted becoming more sedentary due to concerns re COVID exposure & only notes DOE b/c mask.   She was Admitted to [facility] jan1-8 2021 with COVID PNA & acute hypoxic respiratory failure => treated with IV steroids and remdesivir; slowly wean down to 3 L O2 by discharge.  Thought to be somewhat related to underlying COPD -> Has followed up with pulmonary medicine  HRCT chest 06/15/20 >> atherosclerosis, prominent patchy peribronchovascular and subpleural reticulation, minimal GGO, mild/mod traction BTX (post Covid I have these findings.  Recent Hospitalizations:   None since January  Vickie [name] was last seen on Apr 27, 2020.  Was wearing a hair piece because of colostrum code.  Still wearing oxygen mostly at night.  Cough is improving.  Taste and smell better.  Blood pressure is labile.  Sleeping on 2 pillows as of GERD.  Rare fleeting palpitations.  No angina.  Pulm Med 08/12/2020 -> still noting productive cough of thick phlegm.  Not using CPAP but using nightly oxygen.  Did not tolerate Stiolto.  Noted occasional waves across some side to side in her chest the last few seconds ago .  Continuing to use oxygen at night, plan to raise on sleep study.  Follow-up CT ordered.  Reviewed  CV studies:    The following studies were reviewed today: (if available, images/films reviewed: From Epic  Chart or Care Everywhere) . N/A   Interval History:   Vickie [name] is here today still wearing her hair piece.  She said that for the most part she is doing okay, just that her energy level is just still down.  She does not quite "feel right in the head," stating that she feels a little bit foggy a little bit out of it.  She is going to bed about 7:30 to 8 in the evening but then wakes up at 4 AM only to stay in bed until 8 chose not to wake her husband.  She has noted that her dyspnea is better every now is really only using oxygen at night and sometimes with exertion.  But she wants to fully be off it.  She still sleeps somewhat upright because of GERD, but likes to sleep on her left side.  She describes a similar type symptom of a wavelike feeling across her chest that come and go intermittently without any rhyme or reason.  No association with any particular activity.  Cough is steadily getting progressively better.  She may feel some palpitations after using her inhaler but otherwise nothing significant.  Minimal edema.  CV Review of Symptoms (Summary) Cardiovascular ROS: positive for - dyspnea on exertion, shortness of breath and Some tachycardia with exertion but less prominent, wavelike chest discomfort negative for - chest pain, edema, irregular heartbeat, orthopnea, paroxysmal nocturnal dyspnea or Syncope/near syncope, but can have some lightheadedness and  dizziness.  No TIA/amaurosis fugax  The patient no longer has have symptoms concerning for COVID-19 infection (fever, chills, cough, or new shortness of breath).  The patient is practicing social distancing & Masking.    REVIEWED OF SYSTEMS   Review of Systems  Constitutional: Positive for malaise/fatigue. Negative for weight loss.  HENT: Negative for nosebleeds.   Respiratory: Positive for cough (Much less) and shortness of breath. Negative for hemoptysis and sputum production.   Gastrointestinal: Negative for abdominal  pain, blood in stool and melena.  Genitourinary: Negative for hematuria.  Musculoskeletal: Positive for joint pain. Negative for falls.  Neurological: Positive for dizziness and weakness (Generalized). Negative for speech change and headaches.  Psychiatric/Behavioral: Negative for memory loss. The patient does not have insomnia.    I have reviewed and (if needed) personally updated the patient's problem list, medications, allergies, past medical and surgical history, social and family history.   PAST MEDICAL HISTORY   Past Medical History:  Diagnosis Date  . Anginal pain ([place])   . Anxiety   . Anxiety disorder    With apparent panic attacks  . Arthritis    "hands, knees, back" (09/18/2017)  . CAD S/P percutaneous coronary angioplasty 08/2017   Single-vessel CAD involving second diagonal branch treated with DES stent Synergy 2.25 mm x 12 mm (2.4 mm)  . Chronic back pain    "all over" (09/18/2017)  . COPD (chronic obstructive pulmonary disease) ([place])    PFTs were done in 2008 at [facility]  . Depression   . Dyspnea   . Dysrhythmia    LBBB  . Essential hypertension   . GERD (gastroesophageal reflux disease)   . Hiatal hernia   . History of left bundle branch block (LBBB) 08/2017   Rate Related LBBB noted in cath lab  . Hyperlipidemia   . Macular degeneration    right eye  . Myocardial infarction [facility]) 2007   "medically induced"  . Nonocclusive coronary atherosclerosis of native coronary artery 2006 through 2012   multi caths 2012, 2006, [id number] (3546 complicated by catheter-induced dissection of small nondominant RCA, patent in 2009 with no residual abnormality); Myoview August 2013: LOW RISK, normal EF. ; Echocardiogram [name] - January 2014) moderate LVH, EF 55-65%. No significant valvular disease.  . OSA (obstructive sleep apnea) 9/25/ 2012   tested 2009; tetested sleep study 07/2011--titration 09/20/2011 now use Bi-PAP  . OSA on CPAP   . Pneumonia    "several times in  2017; 3 times already this year" (09/18/2017)  . Scoliosis   . Type II diabetes mellitus ([place])     PAST SURGICAL HISTORY   Past Surgical History:  Procedure Laterality Date  . CARDIAC CATHETERIZATION  [id number]; 2009   Non-occlusice CAD - only 80% ostial SP1;  NON DOMINANNT  RCA (catheter insuced dissection with MI in 2007 --> resolved by 2009 cath)  . CATARACT EXTRACTION, BILATERAL Bilateral 2017   Toric lens "in the left eye only"  . COLONOSCOPY WITH PROPOFOL N/A 11/09/2018   Procedure: COLONOSCOPY WITH PROPOFOL;  Surgeon: Rogene Houston, MD;  [facility];  Service: Endoscopy;  Laterality: N/A;  2:25  . CORONARY STENT INTERVENTION N/A 09/18/2017   Procedure: CORONARY STENT INTERVENTION;  Surgeon: Leonie Man, MD;  Location: [facility] INVASIVE CV LAB: DES PCI Diag2: Synergy DES 2.25 mm x 12 mm (2.4 mm)  . DIAGNOSTIC LAPAROSCOPY Right   . DILATION AND CURETTAGE OF UTERUS    . EYE SURGERY    .  INTRAVASCULAR PRESSURE WIRE/FFR STUDY N/A 09/18/2017   Procedure: INTRAVASCULAR PRESSURE WIRE/FFR STUDY;  Surgeon: Leonie Man, MD;  Location: [place] CV LAB;  Service: Cardiovascular: FFR Diag 2 -- 0.78 (significcant) --> PCI  . KNEE ARTHROSCOPY Right   . LEFT HEART CATH AND CORONARY ANGIOGRAPHY N/A 09/18/2017   Procedure: LEFT HEART CATH AND CORONARY ANGIOGRAPHY;  Surgeon: Leonie Man, MD;  Location: [facility] INVASIVE CV LAB: Culpril - 80% Diag2 (FFR 0.78)- PCI.  pLAD 40%, pCx 40%. Post PCI LBBB. LVEDP - ~20 mmHg. EF 55-60%  . LIPOMA EXCISION Right ~ 2015   anterior abdomen  . PARS PLANA VITRECTOMY W/ REPAIR OF MACULAR HOLE Right    Unsuccessful repair.  Hole filled  . POLYPECTOMY  11/09/2018   Procedure: POLYPECTOMY;  Surgeon: Rogene Houston, MD;  [facility];  Service: Endoscopy;;  colon  . TRANSTHORACIC ECHOCARDIOGRAM  01/2020   EF 55 to 60%.  No R WMA.  GR 1 DD (normal for age).  Normal RV size and function.  Right atrial and right ventricular pressures..   Normal aortic mitral valve.-Essentially normal echo.  Marland Kitchen VAGINAL HYSTERECTOMY     Left Heart Catheter Coronary Angiography-PCI November 18, 2017 Single vessel CAD as per CT FFR. --> Successful FFR guided PCI of the Diag2 80% w/ Synergy DES 2.25 x 12 mm      MEDICATIONS/ALLERGIES   Current Meds  Medication Sig  . ALPRAZolam (XANAX) 0.5 MG tablet Take 0.25 mg by mouth 2 (two) times daily.   . AMBULATORY NON FORMULARY MEDICATION Inject 300 mg into the skin as directed. Medication Name:Inclisiran sodium 300 mg vs placebo  . Biotin w/ Vitamins C & E (HAIR/SKIN/NAILS PO) Take 1 tablet by mouth at bedtime.  . busPIRone (BUSPAR) 15 MG tablet Take 15 mg by mouth 2 (two) times daily.  . Calcium Carb-Cholecalciferol (CALCIUM 600 + D PO) Take 1 tablet by mouth daily.   . Coenzyme Q10 (COQ10) 100 MG CAPS Take 100 mg by mouth every morning.  Marland Kitchen CRANBERRY PO Take 1 capsule by mouth daily.   Marland Kitchen escitalopram (LEXAPRO) 20 MG tablet Take 20 mg by mouth every morning.   . furosemide (LASIX) 20 MG tablet TAKE ONE TABLET EVERY OTHER DAY.  Marland Kitchen Ipratropium-Albuterol (COMBIVENT) 20-100 MCG/ACT AERS respimat Inhale 1 puff into the lungs every 6 (six) hours as needed for wheezing or shortness of breath.  . isosorbide mononitrate (IMDUR) 60 MG 24 hr tablet TAKE ONE TABLET BY MOUTH ONCE DAILY.  Marland Kitchen labetalol (NORMODYNE) 100 MG tablet Take 1 tablet (100 mg total) by mouth 2 (two) times daily.  . mirtazapine (REMERON) 15 MG tablet Take 7.5 mg by mouth at bedtime.   . Multiple Vitamin (MULTIVITAMIN) tablet Take 1 tablet by mouth at bedtime.   . nitroGLYCERIN (NITROSTAT) 0.4 MG SL tablet PLACE 1 TAB UNDER TONGUE EVERY 5 MIN IF NEEDED FOR CHEST PAIN. MAY USE 3 TIMES.NO RELIEF CALL 911.  . Omega-3 Fatty Acids (FISH OIL PO) Take 1 capsule by mouth at bedtime.  . pantoprazole (PROTONIX) 40 MG tablet Take 1 tablet (40 mg total) by mouth daily.  . potassium chloride SA (KLOR-CON) 20 MEQ tablet TAKE 1 TABLET EVERY OTHER  DAY, TAKE ON DAYS THAT YOU TAKE FUROSEMIDE.  . pravastatin (PRAVACHOL) 40 MG tablet Take 1 tablet (40 mg total) by mouth daily.  Marland Kitchen SPIRIVA RESPIMAT 2.5 MCG/ACT AERS Inhale 2 puffs into the lungs daily.  . [DISCONTINUED] pantoprazole (PROTONIX) 40 MG tablet TAKE ONE TABLET  BY MOUTH ONCE DAILY.    Allergies  Allergen Reactions  . Adhesive [Tape] Other (See Comments)    REACTION: redness/irritation at application site. **Certain bandages/adhesives cause this reaction**  . Avelox [Moxifloxacin] Anaphylaxis  . Bactrim [Sulfamethoxazole-Trimethoprim] Anaphylaxis, Shortness Of Breath, Rash and Other (See Comments)    REACTION: Choking, inability to swallow, redness  . Levaquin [Levofloxacin] Anaphylaxis, Shortness Of Breath, Rash and Other (See Comments)    Reaction:Choking Brand name Levaquin ok per pt  . Mucinex [Guaifenesin Er] Anaphylaxis, Shortness Of Breath and Rash  . Penicillins Other (See Comments)    "Passed out" Has patient had a PCN reaction causing immediate rash, facial/tongue/throat swelling, SOB or lightheadedness with hypotension: Unknown Has patient had a PCN reaction causing severe rash involving mucus membranes or skin necrosis: No Has patient had a PCN reaction that required hospitalization: No Has patient had a PCN reaction occurring within the last 10 years: No If all of the above answers are "NO", then may proceed with Cephalosporin use.   [place] [Pseudoephedrine Hcl] Shortness Of Breath, Rash and Other (See Comments)    REACTION: Choking, redness, inability to swallow  . Crestor [Rosuvastatin] Other (See Comments)    Leg pain  . Lipitor [Atorvastatin] Other (See Comments)    Leg pain  . Vytorin [Ezetimibe-Simvastatin] Other (See Comments)    Leg pain  . Norvasc [Amlodipine] Other (See Comments)    unknown  . Peanut-Containing Drug Products   . Shrimp [Shellfish Allergy]     SOCIAL HISTORY/FAMILY HISTORY   Reviewed in Epic:  Pertinent findings: Her whole  family came down with COVID-19 shortly after Christmas.  She was hospitalized for 7 days and her husband was hospitalized just a little bit shorter than that.  Actually her husband passed away from complications of Covid shortly after Thanksgiving.  While in the hospital, she was made DNI --> after discussion today, she has rescinded DNI request and is full code.  CODE STATUS: FULL CODE  Social History   Social History Narrative   Married mother of 7, grandmother of 55. Her mother is 17 years old. Quit smoking 34 years ago. Does not drink alcohol.  Retired from [employer] in 2012.   Usually presents with oldest daughter.   Previously worked out at [employer] regularly walking 1/2-1 mile a day, but no longer able to do so because of the [employer] decision to no longer cover [employer] cost.      Was made DNI during her hospital stay for COVID-19 in January 2021, now that she has recovered from [facility] and doing well, she has rescinded this and is now full code.    OBJCTIVE -PE, EKG, labs   Wt Readings from Last 3 Encounters:  09/01/20 179 lb 12.8 oz (81.6 kg)  08/12/20 176 lb 6.4 oz (80 kg)  05/22/20 175 lb 3.2 oz (79.5 kg)    Physical Exam: BP 130/74   Pulse 65   Ht 5' (1.524 m)   Wt 179 lb 12.8 oz (81.6 kg)   SpO2 96%   BMI 35.11 kg/m  Physical Exam Vitals reviewed.  Constitutional:      General: She is not in acute distress.    Appearance: She is well-developed. She is obese. She is not ill-appearing.     Comments: Healthy-appearing.  Well-groomed  HENT:     Head: Normocephalic and atraumatic.  Neck:     Vascular: No carotid bruit, hepatojugular reflux or JVD.  Cardiovascular:  Rate and Rhythm: Normal rate and regular rhythm.  No extrasystoles are present.    Pulses: Intact distal pulses.     Heart sounds: Murmur heard. High-pitched harsh crescendo-decrescendo early systolic murmur is present with a grade of 1/6 at the upper right sternal  border radiating to the neck.  No friction rub. No gallop.   Pulmonary:     Effort: Pulmonary effort is normal. No respiratory distress.     Breath sounds: Normal breath sounds. No wheezing or rales.  Chest:     Chest wall: No tenderness.  Musculoskeletal:        General: Deformity (Multiple finger including both thumbs several metal is not present.  Phocomelia) present. No swelling. Normal range of motion.     Cervical back: Normal range of motion and neck supple.  Neurological:     General: No focal deficit present.     Mental Status: She is alert and oriented to person, place, and time.  Psychiatric:        Mood and Affect: Mood normal.        Behavior: Behavior normal.        Thought Content: Thought content normal.        Judgment: Judgment normal.     Adult ECG Report N/A   Recent Labs:   07/23/2019: TC 102, TG 94, HDL 53, LDL 23 (still on trial medicine)  04/29/2020: TC 137, TG 141, HDL 56, LDL 77 -??  Question if medications changed Lab Results  Component Value Date   CHOL 217 (H) 02/10/2017   HDL 53 02/10/2017   LDLCALC 121 (H) 02/10/2017   TRIG 94 12/27/2019   CHOLHDL 4.1 02/10/2017   Lab Results  Component Value Date   CREATININE 0.71 01/03/2020   BUN 21 01/01/2020   NA 139 01/01/2020   K 5.0 01/01/2020   CL 102 01/01/2020   CO2 28 01/01/2020   Lab Results  Component Value Date   TSH 1.721 08/20/2015    ASSESSMENT/PLAN    Problem List Items Addressed This Visit    Coronary artery disease involving native coronary artery of native heart with angina pectoris ([place]) - Primary (Chronic)    Relatively stable cardiac standpoint as far as any angina.  She has lots of atypical sounding symptoms, but none of them are like her anginal pain. She remains on stable dose of Imdur along with labetalol (despite multiple efforts of trying to convert her to a different beta-blocker). She is on pravastatin -> plus the ORION 8 trial medication. She is supposed to be on  aspirin      Essential hypertension (Chronic)    Blood pressure stable.  On Imdur and labetalol. With her COPD diagnosis, I tried to switch her to Toprol, bisoprolol or even potentially Bystolic, but she remains on labetalol.  Not on ACE inhibitor or ARB because she is only on minimal dose of labetalol with her CAD.      Hyperlipidemia LDL goal <70 (Chronic)    Based on her initial labs last year I would suspect that she was on inclisiran, but now her LDL is 77 which could either mean waning of the medication or change in therapy.      Bilateral lower extremity edema (Chronic)    No longer an issue.  She wears support socks and takes her Lasix every other day.      Obesity (BMI 30-39.9) (Chronic)   CAD S/P percutaneous coronary angioplasty (Chronic)    Taking aspirin every other  day because of bruising.  No longer on Plavix.      DOE (dyspnea on exertion)   Chest wall pain    She has had this wavelike chest discomfort which is not in any way like her anginal symptom.  It happens with various levels of frequency and no association with any activity.  Hard to tell what this is, but does not seem to be cardiac in nature.          COVID-19 Education: The signs and symptoms of COVID-19 were discussed with the patient and how to seek care for testing (follow up with PCP or arrange E-visit).   The importance of social distancing and COVID-19 vaccination was discussed today.  I spent a total of 32 minutes with the patient. >  50% of the time was spent in direct patient consultation.  Additional time spent with chart review  / charting (studies, outside notes, etc): 10 --> Hospitalization reports and follow-up clinic notes reviewed.  Echocardiogram reviewed. Total Time: 42 min   Current medicines are reviewed at length with the patient today.  (+/- concerns) none  Notice: This dictation was prepared with Dragon dictation along with smaller phrase technology. Any transcriptional  errors that result from this process are unintentional and may not be corrected upon review.  Patient Instructions / Medication Changes & Studies & Tests Ordered   Patient Instructions  Medication Instructions:  No changes *If you need a refill on your cardiac medications before your next appointment, please call your pharmacy*   Lab Work: Not needed   Testing/Procedures: Not needed   Follow-Up: At [facility], you and your health needs are our priority.  As part of our continuing mission to provide you with exceptional heart care, we have created designated Provider Care Teams.  These Care Teams include your primary Cardiologist (physician) and Advanced Practice Providers (APPs -  Physician Assistants and Nurse Practitioners) who all work together to provide you with the care you need, when you need it.  We recommend signing up for the patient portal called "MyChart".  Sign up information is provided on this After Visit Summary.  MyChart is used to connect with patients for Virtual Visits (Telemedicine).  Patients are able to view lab/test results, encounter notes, upcoming appointments, etc.  Non-urgent messages can be sent to your provider as well.   To learn more about what you can do with MyChart, go to [email or web address].    Your next appointment:   5 month(s) Feb 2022  The format for your next appointment:   In Person  Provider:   Glenetta Hew, MD      Studies Ordered:   No orders of the defined types were placed in this encounter.    Glenetta Hew, M.D., M.S. Interventional Cardiologist   Pager # [phone] Phone # [phone] [address]. [place], [place] [address]   Thank you for choosing Heartcare at [facility]!!

## 2020-09-01 NOTE — Patient Instructions (Signed)
Medication Instructions:  No changes *If you need a refill on your cardiac medications before your next appointment, please call your pharmacy*   Lab Work: Not needed   Testing/Procedures: Not needed   Follow-Up: At [facility], you and your health needs are our priority.  As part of our continuing mission to provide you with exceptional heart care, we have created designated Provider Care Teams.  These Care Teams include your primary Cardiologist (physician) and Advanced Practice Providers (APPs -  Physician Assistants and Nurse Practitioners) who all work together to provide you with the care you need, when you need it.  We recommend signing up for the patient portal called "MyChart".  Sign up information is provided on this After Visit Summary.  MyChart is used to connect with patients for Virtual Visits (Telemedicine).  Patients are able to view lab/test results, encounter notes, upcoming appointments, etc.  Non-urgent messages can be sent to your provider as well.   To learn more about what you can do with MyChart, go to [email or web address].    Your next appointment:   5 month(s) Feb 2022  The format for your next appointment:   In Person  Provider:   Glenetta Hew, MD

## 2020-09-01 NOTE — Telephone Encounter (Signed)
HST 08/26/20 >> AHI 7, SpO2 low 81%.  Spent 354.2 min with SpO2 < 89%.   Please inform her that her sleep study shows mild obstructive sleep apnea.  Please arrange for ROV with me or NP to discuss treatment options.

## 2020-09-01 NOTE — Telephone Encounter (Signed)
Attempted to call pt but unable to reach. Left message for her to return call. 

## 2020-09-03 DIAGNOSIS — U071 COVID-19: Secondary | ICD-10-CM | POA: Diagnosis not present

## 2020-09-09 ENCOUNTER — Encounter: Payer: Self-pay | Admitting: Cardiology

## 2020-09-09 NOTE — Assessment & Plan Note (Signed)
Taking aspirin every other day because of bruising.  No longer on Plavix.

## 2020-09-09 NOTE — Assessment & Plan Note (Signed)
Relatively stable cardiac standpoint as far as any angina.  She has lots of atypical sounding symptoms, but none of them are like her anginal pain. She remains on stable dose of Imdur along with labetalol (despite multiple efforts of trying to convert her to a different beta-blocker). She is on pravastatin -> plus the ORION 8 trial medication. She is supposed to be on aspirin

## 2020-09-10 NOTE — Assessment & Plan Note (Signed)
She has had this wavelike chest discomfort which is not in any way like her anginal symptom.  It happens with various levels of frequency and no association with any activity.  Hard to tell what this is, but does not seem to be cardiac in nature.

## 2020-09-10 NOTE — Assessment & Plan Note (Signed)
Based on her initial labs last year I would suspect that she was on inclisiran, but now her LDL is 77 which could either mean waning of the medication or change in therapy.

## 2020-09-10 NOTE — Assessment & Plan Note (Signed)
No longer an issue.  She wears support socks and takes her Lasix every other day.

## 2020-09-10 NOTE — Assessment & Plan Note (Signed)
Blood pressure stable.  On Imdur and labetalol. With her COPD diagnosis, I tried to switch her to Toprol, bisoprolol or even potentially Bystolic, but she remains on labetalol.  Not on ACE inhibitor or ARB because she is only on minimal dose of labetalol with her CAD.

## 2020-09-24 DIAGNOSIS — J449 Chronic obstructive pulmonary disease, unspecified: Secondary | ICD-10-CM | POA: Diagnosis not present

## 2020-09-24 DIAGNOSIS — E7849 Other hyperlipidemia: Secondary | ICD-10-CM | POA: Diagnosis not present

## 2020-09-24 DIAGNOSIS — I10 Essential (primary) hypertension: Secondary | ICD-10-CM | POA: Diagnosis not present

## 2020-09-24 DIAGNOSIS — N182 Chronic kidney disease, stage 2 (mild): Secondary | ICD-10-CM | POA: Diagnosis not present

## 2020-09-24 NOTE — Telephone Encounter (Signed)
Please confirm that patient has received message to schedule ROV.  If unable to reach her by phone, then please send her a letter.

## 2020-09-24 NOTE — Telephone Encounter (Signed)
Spoke with patient regarding prior message. Advised patient she would need to make a f/u visit with Dr.Sood or a NP. Made a f/u with Dr.Sood for 09/28/20 at 9:30am to discuss treatment for OSA.Patient's voice was understanding.Notrhing else further needed.

## 2020-09-28 ENCOUNTER — Encounter: Payer: Self-pay | Admitting: Pulmonary Disease

## 2020-09-28 ENCOUNTER — Ambulatory Visit: Payer: Medicare Other | Admitting: Pulmonary Disease

## 2020-09-28 ENCOUNTER — Other Ambulatory Visit: Payer: Self-pay

## 2020-09-28 ENCOUNTER — Ambulatory Visit (INDEPENDENT_AMBULATORY_CARE_PROVIDER_SITE_OTHER): Payer: Medicare Other | Admitting: Pulmonary Disease

## 2020-09-28 VITALS — BP 124/88 | HR 83 | Temp 98.0°F | Ht 60.0 in | Wt 181.6 lb

## 2020-09-28 DIAGNOSIS — Z8616 Personal history of COVID-19: Secondary | ICD-10-CM

## 2020-09-28 DIAGNOSIS — G4733 Obstructive sleep apnea (adult) (pediatric): Secondary | ICD-10-CM | POA: Diagnosis not present

## 2020-09-28 DIAGNOSIS — J9611 Chronic respiratory failure with hypoxia: Secondary | ICD-10-CM

## 2020-09-28 DIAGNOSIS — J849 Interstitial pulmonary disease, unspecified: Secondary | ICD-10-CM

## 2020-09-28 DIAGNOSIS — J45909 Unspecified asthma, uncomplicated: Secondary | ICD-10-CM

## 2020-09-28 MED ORDER — PREDNISONE 10 MG PO TABS: ORAL_TABLET | ORAL | 0 refills | Status: AC

## 2020-09-28 MED ORDER — ALBUTEROL SULFATE HFA 108 (90 BASE) MCG/ACT IN AERS
2.0000 | INHALATION_SPRAY | Freq: Four times a day (QID) | RESPIRATORY_TRACT | 6 refills | Status: DC | PRN
Start: 2020-09-28 — End: 2022-09-29

## 2020-09-28 MED ORDER — AZITHROMYCIN 250 MG PO TABS
ORAL_TABLET | ORAL | 0 refills | Status: AC
Start: 2020-09-28 — End: 2020-10-03

## 2020-09-28 MED ORDER — ALBUTEROL SULFATE (2.5 MG/3ML) 0.083% IN NEBU
2.5000 mg | INHALATION_SOLUTION | Freq: Four times a day (QID) | RESPIRATORY_TRACT | 5 refills | Status: DC | PRN
Start: 2020-09-28 — End: 2021-04-20

## 2020-09-28 NOTE — Patient Instructions (Addendum)
Prednisone 10 mg pill >> 3 pills daily for 2 days, 2 pills daily for 2 days, 1 pill daily for 2 days Zithromax 250 mg >> 2 pills on day 1, then 1 pill daily for the next 4 days Mucinex twice per day as needed to help with cough and chest congestion Plan to get your high dose flu shot once your cough and chest congestion improve Will arrange for CPAP titration sleep study Follow up in 4 months in [place] off

## 2020-09-28 NOTE — Progress Notes (Signed)
[facility]  Chief Complaint  Patient presents with   Follow-up    OSA, DOE, more coughing, chest pain    Constitutional:  BP 124/88 (BP Location: Left Arm, Cuff Size: Normal)    Pulse 83    Temp 98 F (36.7 C) (Oral)    Ht 5' (1.524 m)    Wt 181 lb 9.6 oz (82.4 kg)    SpO2 93%    BMI 35.47 kg/m   Past Medical History:  CAD s/p stent, HTN, HLD, OA, GERD, Depression, Anxiety, Back pain, HH, LBBB, Macular degeneration, Scoliosis, DM type 2  Past Surgical History:  Her  has a past surgical history that includes Lipoma excision (Right, ~ 2015); Diagnostic laparoscopy (Right); Pars plana vitrectomy w/ repair of macular hole (Right); LEFT HEART CATH AND CORONARY ANGIOGRAPHY (N/A, 09/18/2017); INTRAVASCULAR PRESSURE WIRE/FFR STUDY (N/A, 09/18/2017); CORONARY STENT INTERVENTION (N/A, 09/18/2017); Eye surgery; Knee arthroscopy (Right); Cataract extraction, bilateral (Bilateral, 2017); Vaginal hysterectomy; Dilation and curettage of uterus; Cardiac catheterization (2952,8413; 2009); Colonoscopy with propofol (N/A, 11/09/2018); polypectomy (11/09/2018); and transthoracic echocardiogram (01/2020).  Brief Summary:  Vickie [name] is a 74 y.o. female former smoker with dyspnea after COVID 19 pneumonia in January 2021, and obstructive sleep apnea.      Subjective:   She had home sleep study in September.  Showed mild OSA and significant oxygen desaturation.  She is still using 2 liters oxygen at night.  She has more of a cough over the past 3 weeks.  Bringing up thick phlegm.  Also has wheezing in her chest.  Not having headache, sinus congestion, sore throat, abdominal pain, or diarrhea.  She has felt warm, but uncertain whether she had a temperature.  She has felt like she needed to use her oxygen more during the day.  She has been using her albuterol nebulizer more frequently.  She isn't sure where her combivent inhaler is.  Uses spiriva  daily.  Physical Exam:   Appearance - well kempt   ENMT - no sinus tenderness, no oral exudate, no LAN, Mallampati 3 airway, no stridor, narrow nasal angles, pale nasal mucosa  Respiratory - scattered rhonchi with faint wheezing that partially clears with coughing  CV - s1s2 regular rate and rhythm, no murmurs  Ext - no clubbing, no edema, missing several digits on both hands  Skin - no rashes  Psych - normal mood and affect   Pulmonary testing:   PFT 05/22/20 >> FEV1 1.44 (79%), FEV1% 88, TLC 3.36 (75%), DLCO 76%  Chest Imaging:   CT angio chest 12/29/19 >> multifocal GGO  HRCT chest 06/15/20 >> atherosclerosis, prominent patchy peribronchovascular and subpleural reticulation, minimal GGO, mild/mod traction BTX  Sleep Tests:   HST 08/26/20 >> AHI 7, SpO2 low 81%.  Spent 354.2 min with SpO2 < 89%.  Cardiac Tests:   Echo 02/10/20 >> EF 55 to 60%, grade 1 DD  Social History:  She  reports that she quit smoking about 44 years ago. Her smoking use included cigarettes. She has a 25.50 pack-year smoking history. She has never used smokeless tobacco. She reports that she does not drink alcohol and does not use drugs.  Family History:  Her family history includes Breast cancer in her maternal aunt and paternal aunt; CAD in her mother.     Assessment/Plan:   Acute asthmatic bronchitis. - will give course of prednisone and zithromax - she can use mucinex prn - if her symptoms don't improve, then need  additional testing (CXR, labs)  ILD after COVID 19 infection in January 2021. - stiolto caused chest discomfort - she reports benefit from spiriva; continue this - refilled albuterol inhaler and nebulizer - she will need f/u HRCT chest and PFT in June 2022  Chronic respiratory failure with hypoxia. - continue 2 liters oxygen at night for now pending CPAP titration study  Obstructive sleep apnea. - reviewed her home sleep study results - discussed how sleep apnea can impact  her health - treatment options discussed - will arrange for in lab CPAP titration study to determine if she needs supplemental oxygen at night with CPAP, or if she might need Bipap  Influenza vaccination. - advised her to get high dose influenza vaccine once she has recovered from her respiratory infection  Time Spent Involved in Patient Care on Day of Examination:  34 minutes  Follow up:  Patient Instructions  Prednisone 10 mg pill >> 3 pills daily for 2 days, 2 pills daily for 2 days, 1 pill daily for 2 days Zithromax 250 mg >> 2 pills on day 1, then 1 pill daily for the next 4 days Mucinex twice per day as needed to help with cough and chest congestion Plan to get your high dose flu shot once your cough and chest congestion improve Will arrange for CPAP titration sleep study Follow up in 4 months in [place] off   Medication List:   Allergies as of 09/28/2020      Reactions   Adhesive [tape] Other (See Comments)   REACTION: redness/irritation at application site. **Certain bandages/adhesives cause this reaction**   Avelox [moxifloxacin] Anaphylaxis   Bactrim [sulfamethoxazole-trimethoprim] Anaphylaxis, Shortness Of Breath, Rash, Other (See Comments)   REACTION: Choking, inability to swallow, redness   Levaquin [levofloxacin] Anaphylaxis, Shortness Of Breath, Rash, Other (See Comments)   Reaction:Choking Brand name Levaquin ok per pt   Mucinex [guaifenesin Er] Anaphylaxis, Shortness Of Breath, Rash   Penicillins Other (See Comments)   "Passed out" Has patient had a PCN reaction causing immediate rash, facial/tongue/throat swelling, SOB or lightheadedness with hypotension: Unknown Has patient had a PCN reaction causing severe rash involving mucus membranes or skin necrosis: No Has patient had a PCN reaction that required hospitalization: No Has patient had a PCN reaction occurring within the last 10 years: No If all of the above answers are "NO", then may proceed with  Cephalosporin use.   Sudafed [pseudoephedrine Hcl] Shortness Of Breath, Rash, Other (See Comments)   REACTION: Choking, redness, inability to swallow   Crestor [rosuvastatin] Other (See Comments)   Leg pain   Lipitor [atorvastatin] Other (See Comments)   Leg pain   Vytorin [ezetimibe-simvastatin] Other (See Comments)   Leg pain   Norvasc [amlodipine] Other (See Comments)   unknown   Peanut-containing Drug Products    Shrimp [shellfish Allergy]       Medication List       Accurate as of September 28, 2020 10:38 AM. If you have any questions, ask your nurse or doctor.        STOP taking these medications   Ipratropium-Albuterol 20-100 MCG/ACT Aers respimat Commonly known as: COMBIVENT Stopped by: Chesley Mires, MD     TAKE these medications   albuterol (2.5 MG/3ML) 0.083% nebulizer solution Commonly known as: PROVENTIL Take 3 mLs (2.5 mg total) by nebulization every 6 (six) hours as needed for wheezing or shortness of breath. Started by: Chesley Mires, MD   albuterol 108 (90 Base) MCG/ACT inhaler Commonly known as: VENTOLIN  HFA Inhale 2 puffs into the lungs every 6 (six) hours as needed for wheezing or shortness of breath. Started by: Chesley Mires, MD   ALPRAZolam 0.5 MG tablet Commonly known as: XANAX Take 0.25 mg by mouth 2 (two) times daily.   AMBULATORY NON FORMULARY MEDICATION Inject 300 mg into the skin as directed. Medication Name:Inclisiran sodium 300 mg vs placebo   azithromycin 250 MG tablet Commonly known as: ZITHROMAX Take 2 tablets (500 mg total) by mouth daily for 1 day, THEN 1 tablet (250 mg total) daily for 4 days. Start taking on: September 28, 2020 Started by: Chesley Mires, MD   busPIRone 15 MG tablet Commonly known as: BUSPAR Take 15 mg by mouth 2 (two) times daily.   CALCIUM 600 + D PO Take 1 tablet by mouth daily.   CoQ10 100 MG Caps Take 100 mg by mouth every morning.   CRANBERRY PO Take 1 capsule by mouth daily.   escitalopram 20 MG  tablet Commonly known as: LEXAPRO Take 20 mg by mouth every morning.   FISH OIL PO Take 1 capsule by mouth at bedtime.   furosemide 20 MG tablet Commonly known as: LASIX TAKE ONE TABLET EVERY OTHER DAY.   HAIR/SKIN/NAILS PO Take 1 tablet by mouth at bedtime.   isosorbide mononitrate 60 MG 24 hr tablet Commonly known as: IMDUR TAKE ONE TABLET BY MOUTH ONCE DAILY.   labetalol 100 MG tablet Commonly known as: NORMODYNE Take 1 tablet (100 mg total) by mouth 2 (two) times daily.   mirtazapine 15 MG tablet Commonly known as: REMERON Take 7.5 mg by mouth at bedtime.   multivitamin tablet Take 1 tablet by mouth at bedtime.   nitroGLYCERIN 0.4 MG SL tablet Commonly known as: NITROSTAT PLACE 1 TAB UNDER TONGUE EVERY 5 MIN IF NEEDED FOR CHEST PAIN. MAY USE 3 TIMES.NO RELIEF CALL 911.   pantoprazole 40 MG tablet Commonly known as: PROTONIX Take 1 tablet (40 mg total) by mouth daily.   potassium chloride SA 20 MEQ tablet Commonly known as: KLOR-CON TAKE 1 TABLET EVERY OTHER DAY, TAKE ON DAYS THAT YOU TAKE FUROSEMIDE.   pravastatin 40 MG tablet Commonly known as: PRAVACHOL Take 1 tablet (40 mg total) by mouth daily.   predniSONE 10 MG tablet Commonly known as: DELTASONE Take 3 tablets (30 mg total) by mouth daily with breakfast for 2 days, THEN 2 tablets (20 mg total) daily with breakfast for 2 days, THEN 1 tablet (10 mg total) daily with breakfast for 2 days. Start taking on: September 28, 2020 Started by: Chesley Mires, MD   Spiriva Respimat 2.5 MCG/ACT Aers Generic drug: Tiotropium Bromide Monohydrate Inhale 2 puffs into the lungs daily.       Signature:  Chesley Mires, MD [place] Pager - [phone] 09/28/2020, 10:38 AM

## 2020-10-03 DIAGNOSIS — U071 COVID-19: Secondary | ICD-10-CM | POA: Diagnosis not present

## 2020-10-06 ENCOUNTER — Ambulatory Visit: Payer: Medicare Other | Attending: Pulmonary Disease | Admitting: Pulmonary Disease

## 2020-10-06 ENCOUNTER — Other Ambulatory Visit: Payer: Self-pay

## 2020-10-06 DIAGNOSIS — G4733 Obstructive sleep apnea (adult) (pediatric): Secondary | ICD-10-CM | POA: Diagnosis not present

## 2020-10-07 ENCOUNTER — Telehealth: Payer: Self-pay | Admitting: Pulmonary Disease

## 2020-10-07 DIAGNOSIS — G4733 Obstructive sleep apnea (adult) (pediatric): Secondary | ICD-10-CM

## 2020-10-07 NOTE — Procedures (Signed)
    Patient Name: Vickie [name], Vickie [name] Date: 10/06/2020 Gender: Female D.O.B: 11/27/1946 Age (years): 26 Referring Provider: Chesley Mires MD, ABSM Height (inches): 60 Interpreting Physician: Chesley Mires MD, ABSM Weight (lbs): 181 RPSGT: Rosebud Poles BMI: 35 MRN: [id number] Neck Size: 15.50  CLINICAL INFORMATION The patient is referred for a CPAP titration to treat sleep apnea.  Date of HST 08/26/20: AHI 7, SpO2 low 81%, Spent 354.2 minutes with SpO2 < 89%.  SLEEP STUDY TECHNIQUE As per the AASM Manual for the Scoring of Sleep and Associated Events v2.3 (April 2016) with a hypopnea requiring 4% desaturations.  The channels recorded and monitored were frontal, central and occipital EEG, electrooculogram (EOG), submentalis EMG (chin), nasal and oral airflow, thoracic and abdominal wall motion, anterior tibialis EMG, snore microphone, electrocardiogram, and pulse oximetry. Continuous positive airway pressure (CPAP) was initiated at the beginning of the study and titrated to treat sleep-disordered breathing.  MEDICATIONS Medications self-administered by patient taken the night of the study : N/A  TECHNICIAN COMMENTS Comments added by technician: CPAP therapy started at 4 cm of H20 . Titration increased to 9 cm of H20 due to events increasing in REM stage. Suboptimal pressure obtained due to no REM-supine stage observed. Patient tolerated CPAP very well. Isolated PAC's noticed at times. Sleep talking observed Comments added by scorer: N/A  RESPIRATORY PARAMETERS Optimal PAP Pressure (cm): 9 AHI at Optimal Pressure (/hr): 5.6 Overall Minimal O2 (%): 83.0 Supine % at Optimal Pressure (%): 0 Minimal O2 at Optimal Pressure (%): 89.0   SLEEP ARCHITECTURE The study was initiated at 9:50:43 PM and ended at 4:38:47 AM.  Sleep onset time was 23.2 minutes and the sleep efficiency was 86.6%%. The total sleep time was 353.5 minutes.  The patient spent 5.1%% of the night in stage N1  sleep, 82.6%% in stage N2 sleep, 1.1%% in stage N3 and 11.2% in REM.Stage REM latency was 344.0 minutes  Wake after sleep onset was 31.3. Alpha intrusion was absent. Supine sleep was 0.00%.  CARDIAC DATA The 2 lead EKG demonstrated sinus rhythm. The mean heart rate was 57.8 beats per minute. Other EKG findings include: None.  LEG MOVEMENT DATA The total Periodic Limb Movements of Sleep (PLMS) were 0. The PLMS index was 0.0. A PLMS index of <15 is considered normal in adults.  IMPRESSIONS - The optimal PAP pressure during this study was 9 cm of water. - She did not require the use of supplemental oxygen during this study.  DIAGNOSIS - Obstructive Sleep Apnea (G47.33)  RECOMMENDATIONS - Trial of CPAP therapy on 9 cm H2O with a Small size [employer] Full Face Mask Simplus mask and heated humidification.  [Electronically signed] 10/07/2020 11:10 AM  Chesley Mires MD, ABSM Diplomate, American Board of Sleep Medicine   NPI: [id number]

## 2020-10-07 NOTE — Telephone Encounter (Signed)
CPAP titration 10/06/20 >> CPAP 9 cm H2O >> AHI 5.6, didn't need supplemental oxygen.   Please let her know she did well during her sleep study with CPAP and didn't need to use oxygen.  Please send order to arrange for CPAP at 9 cm H2O with heated humidity and mask of choice.  She can discontinue nocturnal oxygen use once she has CPAP set up.  She will need follow up in 3 to 4 months.

## 2020-10-08 ENCOUNTER — Ambulatory Visit (INDEPENDENT_AMBULATORY_CARE_PROVIDER_SITE_OTHER): Payer: Medicare Other

## 2020-10-08 ENCOUNTER — Other Ambulatory Visit: Payer: Self-pay

## 2020-10-08 DIAGNOSIS — Z23 Encounter for immunization: Secondary | ICD-10-CM | POA: Diagnosis not present

## 2020-10-08 NOTE — Telephone Encounter (Signed)
Called and spoke with patient about sleep study study with CPAP results per Dr Halford Chessman. Explained that I will put in order for CPAP per Dr Halford Chessman and that AFTER she get's the CPAP she will not need the Oxygen at night with the CPAP. Patient expressed full understanding with teach back to writer that patient is to continue using Oxygen at night until the CPAP is set up at her home and then can discontinue using oxygen at night when she is using the CPAP. Patient also aware and expressed understanding that she needs to come to office to have a follow up appointment in 3-4 months with Dr Halford Chessman. Recall placed and order for CPAP placed per Dr Halford Chessman. Nothing further needed at this time.

## 2020-10-24 DIAGNOSIS — N182 Chronic kidney disease, stage 2 (mild): Secondary | ICD-10-CM | POA: Diagnosis not present

## 2020-10-24 DIAGNOSIS — I10 Essential (primary) hypertension: Secondary | ICD-10-CM | POA: Diagnosis not present

## 2020-10-24 DIAGNOSIS — J449 Chronic obstructive pulmonary disease, unspecified: Secondary | ICD-10-CM | POA: Diagnosis not present

## 2020-10-24 DIAGNOSIS — E7849 Other hyperlipidemia: Secondary | ICD-10-CM | POA: Diagnosis not present

## 2020-10-26 ENCOUNTER — Telehealth: Payer: Self-pay | Admitting: Pulmonary Disease

## 2020-10-27 NOTE — Telephone Encounter (Signed)
Spoke with pt. She is requesting to have an order sent to [facility] to her nocturnal oxygen discontinued. Per telephone encounter 10/07/20, Dr. Halford Chessman said her nocturnal oxygen could be discontinued once she was set up with CPAP. Pt states that she has not been set up yet, [facility] advised her that it could take 4-6 weeks to get her machine. Advised her of Dr. Juanetta Gosling documentation. Pt verbalized understanding. Nothing further was needed.

## 2020-11-03 DIAGNOSIS — U071 COVID-19: Secondary | ICD-10-CM | POA: Diagnosis not present

## 2020-11-04 ENCOUNTER — Other Ambulatory Visit: Payer: Self-pay

## 2020-11-04 ENCOUNTER — Encounter: Payer: Medicare Other | Admitting: *Deleted

## 2020-11-04 VITALS — BP 173/70

## 2020-11-04 DIAGNOSIS — Z006 Encounter for examination for normal comparison and control in clinical research program: Secondary | ICD-10-CM

## 2020-11-04 NOTE — Research (Signed)
Subject came to research lab for [place] 8 Visit 6, Day 810. All concomitant medications have been reviewed and updated if applicable. No AE's or SAE's to report to sponsor at this time. Subject was given IP subcutaneous injection into right abdomen at 0925 and remained in research lab for 30 minutes after. Subject tolerated well. Next appointment scheduled for Tuesday Apr 28, 2021 @ 0915. Subject left research lab at [phone].

## 2020-11-05 DIAGNOSIS — E1151 Type 2 diabetes mellitus with diabetic peripheral angiopathy without gangrene: Secondary | ICD-10-CM | POA: Diagnosis not present

## 2020-11-05 DIAGNOSIS — E1140 Type 2 diabetes mellitus with diabetic neuropathy, unspecified: Secondary | ICD-10-CM | POA: Diagnosis not present

## 2020-11-11 ENCOUNTER — Other Ambulatory Visit: Payer: Self-pay

## 2020-11-11 ENCOUNTER — Other Ambulatory Visit (HOSPITAL_COMMUNITY): Payer: Self-pay | Admitting: Internal Medicine

## 2020-11-11 ENCOUNTER — Ambulatory Visit (HOSPITAL_COMMUNITY)
Admission: RE | Admit: 2020-11-11 | Discharge: 2020-11-11 | Disposition: A | Discharge status: Home / Self Care | Payer: Medicare Other | Source: Ambulatory Visit | Attending: Internal Medicine | Admitting: Internal Medicine

## 2020-11-11 DIAGNOSIS — E7849 Other hyperlipidemia: Secondary | ICD-10-CM | POA: Diagnosis not present

## 2020-11-11 DIAGNOSIS — Z6835 Body mass index (BMI) 35.0-35.9, adult: Secondary | ICD-10-CM | POA: Diagnosis not present

## 2020-11-11 DIAGNOSIS — I10 Essential (primary) hypertension: Secondary | ICD-10-CM | POA: Diagnosis not present

## 2020-11-11 DIAGNOSIS — R042 Hemoptysis: Secondary | ICD-10-CM | POA: Insufficient documentation

## 2020-11-11 DIAGNOSIS — I517 Cardiomegaly: Secondary | ICD-10-CM | POA: Diagnosis not present

## 2020-11-11 DIAGNOSIS — R1110 Vomiting, unspecified: Secondary | ICD-10-CM | POA: Diagnosis not present

## 2020-11-11 DIAGNOSIS — R059 Cough, unspecified: Secondary | ICD-10-CM | POA: Diagnosis not present

## 2020-11-11 DIAGNOSIS — R0602 Shortness of breath: Secondary | ICD-10-CM | POA: Diagnosis not present

## 2020-11-13 ENCOUNTER — Other Ambulatory Visit (HOSPITAL_COMMUNITY): Payer: Self-pay | Admitting: Internal Medicine

## 2020-11-13 DIAGNOSIS — R053 Chronic cough: Secondary | ICD-10-CM

## 2020-11-17 ENCOUNTER — Encounter (INDEPENDENT_AMBULATORY_CARE_PROVIDER_SITE_OTHER): Payer: Self-pay | Admitting: *Deleted

## 2020-11-24 DIAGNOSIS — I10 Essential (primary) hypertension: Secondary | ICD-10-CM | POA: Diagnosis not present

## 2020-11-24 DIAGNOSIS — N182 Chronic kidney disease, stage 2 (mild): Secondary | ICD-10-CM | POA: Diagnosis not present

## 2020-11-24 DIAGNOSIS — E7849 Other hyperlipidemia: Secondary | ICD-10-CM | POA: Diagnosis not present

## 2020-11-24 DIAGNOSIS — J449 Chronic obstructive pulmonary disease, unspecified: Secondary | ICD-10-CM | POA: Diagnosis not present

## 2020-11-25 ENCOUNTER — Ambulatory Visit: Payer: Medicare Other | Admitting: Pulmonary Disease

## 2020-11-25 ENCOUNTER — Other Ambulatory Visit: Payer: Self-pay

## 2020-11-25 ENCOUNTER — Encounter: Payer: Self-pay | Admitting: Pulmonary Disease

## 2020-11-25 VITALS — BP 144/72 | HR 74 | Temp 97.8°F | Ht 60.0 in | Wt 181.8 lb

## 2020-11-25 DIAGNOSIS — J849 Interstitial pulmonary disease, unspecified: Secondary | ICD-10-CM

## 2020-11-25 DIAGNOSIS — Z8616 Personal history of COVID-19: Secondary | ICD-10-CM | POA: Diagnosis not present

## 2020-11-25 DIAGNOSIS — J9611 Chronic respiratory failure with hypoxia: Secondary | ICD-10-CM | POA: Diagnosis not present

## 2020-11-25 DIAGNOSIS — G4733 Obstructive sleep apnea (adult) (pediatric): Secondary | ICD-10-CM

## 2020-11-25 NOTE — Progress Notes (Signed)
[facility]  Chief Complaint  Patient presents with   Follow-up    chest xray on 11/13/2020 showed pna    Constitutional:  BP (!) 144/72 (BP Location: Left Arm, Cuff Size: Normal)    Pulse 74    Temp 97.8 F (36.6 C) (Other (Comment)) Comment (Src): wrist   Ht 5' (1.524 m)    Wt 181 lb 12.8 oz (82.5 kg)    SpO2 94% Comment: Room air   BMI 35.51 kg/m   Past Medical History:  CAD s/p stent, HTN, HLD, OA, GERD, Depression, Anxiety, Back pain, HH, LBBB, Macular degeneration, Scoliosis, DM type 2  Past Surgical History:  Her  has a past surgical history that includes Lipoma excision (Right, ~ 2015); Diagnostic laparoscopy (Right); Pars plana vitrectomy w/ repair of macular hole (Right); LEFT HEART CATH AND CORONARY ANGIOGRAPHY (N/A, 09/18/2017); INTRAVASCULAR PRESSURE WIRE/FFR STUDY (N/A, 09/18/2017); CORONARY STENT INTERVENTION (N/A, 09/18/2017); Eye surgery; Knee arthroscopy (Right); Cataract extraction, bilateral (Bilateral, 2017); Vaginal hysterectomy; Dilation and curettage of uterus; Cardiac catheterization (5176,1607; 2009); Colonoscopy with propofol (N/A, 11/09/2018); polypectomy (11/09/2018); and transthoracic echocardiogram (01/2020).  Brief Summary:  Vickie [name] is a 74 y.o. female former smoker with dyspnea after COVID 19 pneumonia in January 2021, and obstructive sleep apnea.      Subjective:   She hasn't received CPAP yet.  She isn't sure when she will get this.  She was told by her DME that she needed to qualify for home oxygen therapy again.  She has been using oxygen during the day some.  She says her pulse ox at home can drop into the mid-80's.    She saw her PCP a few weeks ago.  She was having more cough and shortness of breath.  Chest xray from 11/12/20 showed increased interstitial markings with improvement compared to March 2021.  Was told she had pneumonia and would need a repeat chest xray in mid December.  She needs to  use her inhalers several times per day and this helps.  Physical Exam:   Appearance - well kempt   ENMT - no sinus tenderness, no oral exudate, no LAN, Mallampati 3 airway, no stridor  Respiratory - equal breath sounds bilaterally, no wheezing or rales  CV - s1s2 regular rate and rhythm, no murmurs  Ext - no clubbing, no edema, missing several digits on both hands  Skin - no rashes  Psych - normal mood and affect   Pulmonary testing:   PFT 05/22/20 >> FEV1 1.44 (79%), FEV1% 88, TLC 3.36 (75%), DLCO 76%  Chest Imaging:   CT angio chest 12/29/19 >> multifocal GGO  HRCT chest 06/15/20 >> atherosclerosis, prominent patchy peribronchovascular and subpleural reticulation, minimal GGO, mild/mod traction BTX  Sleep Tests:   HST 08/26/20 >> AHI 7, SpO2 low 81%.  Spent 354.2 min with SpO2 < 89%.  CPAP titration 10/06/20 >> CPAP 9 cm H2O >> AHI 5.6, didn't need supplemental oxygen.  Cardiac Tests:   Echo 02/10/20 >> EF 55 to 60%, grade 1 DD  Social History:  She  reports that she quit smoking about 44 years ago. Her smoking use included cigarettes. She has a 25.50 pack-year smoking history. She has never used smokeless tobacco. She reports that she does not drink alcohol and does not use drugs.  Family History:  Her family history includes Breast cancer in her maternal aunt and paternal aunt; CAD in her mother.     Assessment/Plan:   ILD after  COVID 19 infection in January 2021. - stiolto caused chest discomfort - continue stiolto with prn albuterol - she will need f/u HRCT chest and PFT in June 2022  Chronic respiratory failure with hypoxia. - she maintained her SpO2 > 94% on room air today after walking two laps at slow pace - will arrange for 6 minute walk test to further assess whether she qualifies for home oxygen  Obstructive sleep apnea. - she is still waiting to get CPAP set up  - she will need CPAP 9 cm H2O - will need to arrange for ONO with CPAP once she gets it  and then determine if she needs supplemental oxygen at night with CPAP  Recent respiratory infection. - she has follow up chest xray scheduled through her PCP for middle part of December 2021  Time Spent Involved in Patient Care on Day of Examination:  32 minutes  Follow up:  Patient Instructions  Call after you have your chest xray done in few weeks  Will arrange for 6 minute walk test  Follow up in [place] office in 4 months   Medication List:   Allergies as of 11/25/2020      Reactions   Adhesive [tape] Other (See Comments)   REACTION: redness/irritation at application site. **Certain bandages/adhesives cause this reaction**   Avelox [moxifloxacin] Anaphylaxis   Bactrim [sulfamethoxazole-trimethoprim] Anaphylaxis, Shortness Of Breath, Rash, Other (See Comments)   REACTION: Choking, inability to swallow, redness   Levaquin [levofloxacin] Anaphylaxis, Shortness Of Breath, Rash, Other (See Comments)   Reaction:Choking Brand name Levaquin ok per pt   Mucinex [guaifenesin Er] Anaphylaxis, Shortness Of Breath, Rash   Penicillins Other (See Comments)   "Passed out" Has patient had a PCN reaction causing immediate rash, facial/tongue/throat swelling, SOB or lightheadedness with hypotension: Unknown Has patient had a PCN reaction causing severe rash involving mucus membranes or skin necrosis: No Has patient had a PCN reaction that required hospitalization: No Has patient had a PCN reaction occurring within the last 10 years: No If all of the above answers are "NO", then may proceed with Cephalosporin use.   Sudafed [pseudoephedrine Hcl] Shortness Of Breath, Rash, Other (See Comments)   REACTION: Choking, redness, inability to swallow   Crestor [rosuvastatin] Other (See Comments)   Leg pain   Lipitor [atorvastatin] Other (See Comments)   Leg pain   Vytorin [ezetimibe-simvastatin] Other (See Comments)   Leg pain   Fish Oil Nausea And Vomiting   Norvasc [amlodipine] Other (See  Comments)   unknown   Peanut-containing Drug Products    Shrimp [shellfish Allergy]       Medication List       Accurate as of November 25, 2020  3:21 PM. If you have any questions, ask your nurse or doctor.        STOP taking these medications   FISH OIL PO Stopped by: Chesley Mires, MD     TAKE these medications   albuterol (2.5 MG/3ML) 0.083% nebulizer solution Commonly known as: PROVENTIL Take 3 mLs (2.5 mg total) by nebulization every 6 (six) hours as needed for wheezing or shortness of breath.   albuterol 108 (90 Base) MCG/ACT inhaler Commonly known as: VENTOLIN HFA Inhale 2 puffs into the lungs every 6 (six) hours as needed for wheezing or shortness of breath.   ALPRAZolam 0.5 MG tablet Commonly known as: XANAX Take 0.25 mg by mouth 2 (two) times daily.   AMBULATORY NON FORMULARY MEDICATION Inject 300 mg into the skin as directed. Medication  Name:Inclisiran sodium 300 mg vs placebo   busPIRone 15 MG tablet Commonly known as: BUSPAR Take 15 mg by mouth 2 (two) times daily.   CALCIUM 600 + D PO Take 1 tablet by mouth daily.   CoQ10 100 MG Caps Take 100 mg by mouth every morning.   CRANBERRY PO Take 1 capsule by mouth daily.   escitalopram 20 MG tablet Commonly known as: LEXAPRO Take 20 mg by mouth every morning.   esomeprazole 20 MG capsule Commonly known as: NEXIUM Take 20 mg by mouth daily at 12 noon.   furosemide 20 MG tablet Commonly known as: LASIX TAKE ONE TABLET EVERY OTHER DAY.   HAIR/SKIN/NAILS PO Take 1 tablet by mouth at bedtime.   isosorbide mononitrate 60 MG 24 hr tablet Commonly known as: IMDUR TAKE ONE TABLET BY MOUTH ONCE DAILY.   labetalol 100 MG tablet Commonly known as: NORMODYNE Take 1 tablet (100 mg total) by mouth 2 (two) times daily.   mirtazapine 15 MG tablet Commonly known as: REMERON Take 7.5 mg by mouth at bedtime.   multivitamin tablet Take 1 tablet by mouth at bedtime.   nitroGLYCERIN 0.4 MG SL  tablet Commonly known as: NITROSTAT PLACE 1 TAB UNDER TONGUE EVERY 5 MIN IF NEEDED FOR CHEST PAIN. MAY USE 3 TIMES.NO RELIEF CALL 911.   pantoprazole 40 MG tablet Commonly known as: PROTONIX Take 1 tablet (40 mg total) by mouth daily.   potassium chloride SA 20 MEQ tablet Commonly known as: KLOR-CON TAKE 1 TABLET EVERY OTHER DAY, TAKE ON DAYS THAT YOU TAKE FUROSEMIDE.   pravastatin 40 MG tablet Commonly known as: PRAVACHOL Take 1 tablet (40 mg total) by mouth daily.   Spiriva Respimat 2.5 MCG/ACT Aers Generic drug: Tiotropium Bromide Monohydrate Inhale 2 puffs into the lungs daily.       Signature:  Chesley Mires, MD [place] Pager - [phone] 11/25/2020, 3:21 PM

## 2020-11-25 NOTE — Patient Instructions (Addendum)
Call after you have your chest xray done in few weeks  Will arrange for 6 minute walk test  Follow up in [place] office in 4 months

## 2020-12-03 DIAGNOSIS — U071 COVID-19: Secondary | ICD-10-CM | POA: Diagnosis not present

## 2020-12-10 ENCOUNTER — Ambulatory Visit (INDEPENDENT_AMBULATORY_CARE_PROVIDER_SITE_OTHER): Payer: Medicare Other | Admitting: Pulmonary Disease

## 2020-12-10 ENCOUNTER — Other Ambulatory Visit: Payer: Self-pay

## 2020-12-10 DIAGNOSIS — Z8616 Personal history of COVID-19: Secondary | ICD-10-CM

## 2020-12-10 DIAGNOSIS — J849 Interstitial pulmonary disease, unspecified: Secondary | ICD-10-CM

## 2020-12-10 NOTE — Progress Notes (Signed)
Six Minute Walk - 12/10/20 1417      Six Minute Walk   Medications taken before test (dose and time) Xanax 0.25mg , Buspar 15mg , calcium/vit D supplement, CoQ10, Lexapro 20mg , Imdur 60mg , labetalol 100mg , MVI, Spiriva 2.5 (2 puffs) taken approx 0800    Supplemental oxygen during test? No    Lap distance in meters  34 meters    Laps Completed 7    Partial lap (in meters) 13 meters    Baseline BP (sitting) 136/72    Baseline Heartrate 71    Baseline Dyspnea (Borg Scale) 1    Baseline Fatigue (Borg Scale) 0    Baseline SPO2 95 %      End of Test Values    BP (sitting) 148/88    Heartrate 111    Dyspnea (Borg Scale) 5    Fatigue (Borg Scale) 3    SPO2 89 %      2 Minutes Post Walk Values   BP (sitting) 142/84    Heartrate 95    SPO2 96 %    Stopped or paused before six minutes? Yes    Reason: Pt stopped walking 3:40 into walk and paused leaning against a wall X 15 seconds.  Pt then started walk again.      Interpretation   Distance completed 251 meters    Tech Comments: Patient tolerated walk well. Walked a moderate pace, was visibly perspiring at end of test.

## 2020-12-14 ENCOUNTER — Ambulatory Visit (HOSPITAL_COMMUNITY)
Admission: RE | Admit: 2020-12-14 | Discharge: 2020-12-14 | Disposition: A | Discharge status: Home / Self Care | Payer: Medicare Other | Source: Ambulatory Visit | Attending: Internal Medicine | Admitting: Internal Medicine

## 2020-12-14 ENCOUNTER — Other Ambulatory Visit: Payer: Self-pay

## 2020-12-14 DIAGNOSIS — R053 Chronic cough: Secondary | ICD-10-CM | POA: Insufficient documentation

## 2020-12-14 DIAGNOSIS — J90 Pleural effusion, not elsewhere classified: Secondary | ICD-10-CM | POA: Diagnosis not present

## 2020-12-14 DIAGNOSIS — R059 Cough, unspecified: Secondary | ICD-10-CM | POA: Diagnosis not present

## 2020-12-14 DIAGNOSIS — R0602 Shortness of breath: Secondary | ICD-10-CM | POA: Diagnosis not present

## 2020-12-15 ENCOUNTER — Telehealth: Payer: Self-pay | Admitting: Pulmonary Disease

## 2020-12-15 NOTE — Telephone Encounter (Signed)
Pt is requesting results of CXR from 12/14/2020.  This was ordered by Dr. Gerarda Fraction but was advised at 12/1 OV to call our office after cxr.   VS please advise, thanks!

## 2020-12-15 NOTE — Telephone Encounter (Signed)
Called and spoke with pt letting her know the results of cxr per Dr. Halford Chessman and she verbalized understanding. Nothing further needed.

## 2020-12-15 NOTE — Telephone Encounter (Signed)
DG Chest 2 View  Result Date: 12/14/2020 CLINICAL DATA:  Chronic cough.  Congestion.  Shortness of breath. EXAM: CHEST - 2 VIEW COMPARISON:  Two-view chest x-ray 11/11/2020 FINDINGS: Heart is mildly enlarged. Chronic interstitial fibrosis is stable. No superimposed disease is evident. Edema effusion is present. No consolidation is present. Degenerative changes in thoracic spine and shoulders are stable. IMPRESSION: 1. Stable chronic interstitial fibrosis. 2. No acute cardiopulmonary disease. Electronically Signed   By: San Morelle M.D.   On: 12/14/2020 15:24     Please let her know her chest xray shows stable changes of lung scarring from interstitial lung disease after her COVID 19 infection.

## 2020-12-16 ENCOUNTER — Other Ambulatory Visit: Payer: Self-pay | Admitting: Cardiology

## 2020-12-23 ENCOUNTER — Other Ambulatory Visit: Payer: Self-pay | Admitting: Cardiology

## 2020-12-24 DIAGNOSIS — E6609 Other obesity due to excess calories: Secondary | ICD-10-CM | POA: Diagnosis not present

## 2020-12-24 DIAGNOSIS — E119 Type 2 diabetes mellitus without complications: Secondary | ICD-10-CM | POA: Diagnosis not present

## 2020-12-24 DIAGNOSIS — Z6835 Body mass index (BMI) 35.0-35.9, adult: Secondary | ICD-10-CM | POA: Diagnosis not present

## 2020-12-25 DIAGNOSIS — N182 Chronic kidney disease, stage 2 (mild): Secondary | ICD-10-CM | POA: Diagnosis not present

## 2020-12-25 DIAGNOSIS — I10 Essential (primary) hypertension: Secondary | ICD-10-CM | POA: Diagnosis not present

## 2020-12-25 DIAGNOSIS — E782 Mixed hyperlipidemia: Secondary | ICD-10-CM | POA: Diagnosis not present

## 2020-12-25 DIAGNOSIS — J449 Chronic obstructive pulmonary disease, unspecified: Secondary | ICD-10-CM | POA: Diagnosis not present

## 2020-12-28 ENCOUNTER — Other Ambulatory Visit: Payer: Self-pay | Admitting: Cardiology

## 2021-01-03 DIAGNOSIS — U071 COVID-19: Secondary | ICD-10-CM | POA: Diagnosis not present

## 2021-01-19 ENCOUNTER — Ambulatory Visit (INDEPENDENT_AMBULATORY_CARE_PROVIDER_SITE_OTHER): Payer: Medicare Other | Admitting: Internal Medicine

## 2021-01-20 ENCOUNTER — Other Ambulatory Visit (INDEPENDENT_AMBULATORY_CARE_PROVIDER_SITE_OTHER): Payer: Self-pay

## 2021-01-20 ENCOUNTER — Other Ambulatory Visit: Payer: Self-pay

## 2021-01-20 ENCOUNTER — Encounter (INDEPENDENT_AMBULATORY_CARE_PROVIDER_SITE_OTHER): Payer: Self-pay | Admitting: Internal Medicine

## 2021-01-20 ENCOUNTER — Telehealth (INDEPENDENT_AMBULATORY_CARE_PROVIDER_SITE_OTHER): Payer: Medicare Other | Admitting: Internal Medicine

## 2021-01-20 VITALS — Ht 61.0 in | Wt 180.0 lb

## 2021-01-20 DIAGNOSIS — R1319 Other dysphagia: Secondary | ICD-10-CM

## 2021-01-20 DIAGNOSIS — K219 Gastro-esophageal reflux disease without esophagitis: Secondary | ICD-10-CM

## 2021-01-20 NOTE — Progress Notes (Addendum)
Virtual Visit via Telephone Note  I connected with Vickie [name] on 01/20/21 at  2:14 PM EST by telephone and verified that I am speaking with the correct person using two identifiers.  Location: Patient: home Provider: office   I discussed the limitations, risks, security and privacy concerns of performing an evaluation and management service by telephone and the availability of in person appointments. I also discussed with the patient that there may be a patient responsible charge related to this service. The patient expressed understanding and agreed to proceed.  Presenting complaint  Swallowing difficulty.  History of Present Illness:  Patient is 75 year old Caucasian female who presents for several month history of solid food dysphagia.  She says she has had dysphagia off and on for years but it has gotten worse over the last few months.  She has had 2 episodes of food impaction relieved with regurgitation.  She feels heartburn is well controlled with therapy.  She did have barium study in November 2014 which did not reveal any abnormality.  She denies nausea vomiting but she does have chronic cough.  She states cough started 1 year ago when she was hospitalized with Covid pneumonia.  Every time she gets cough she gets scared.  She states her son and grandson stop by 5 days ago to visit her for an hour or 2.  Her son called her to tell her that they both tested positive for Covid.  She went to her pharmacy and had rapid Covid test and it came back negative.  She is not having any fever.  She remains on bronchodilator therapy. She also reports episodes of vomiting and hematemesis.  She has not had hematemesis in 2 months.  She says she had multiple episodes when she vomited small amount of fresh blood.  She realized that she was getting sick every time she took fish oil.  As soon as she stopped fish oil she has not had any more episodes of hematemesis.  She denies melena or rectal  bleeding.  She had EGD in 1999 revealing nonobstructive Schatzki's ring and small sliding hiatal hernia. Last colonoscopy was in November 2019 with removal of 3 small polyps and these are tubular adenomas.  Next colonoscopy would be in November 2024.   Current Outpatient Medications:  .  albuterol (PROVENTIL) (2.5 MG/3ML) 0.083% nebulizer solution, Take 3 mLs (2.5 mg total) by nebulization every 6 (six) hours as needed for wheezing or shortness of breath., Disp: 360 mL, Rfl: 5 .  albuterol (VENTOLIN HFA) 108 (90 Base) MCG/ACT inhaler, Inhale 2 puffs into the lungs every 6 (six) hours as needed for wheezing or shortness of breath., Disp: 8 g, Rfl: 6 .  ALPRAZolam (XANAX) 0.5 MG tablet, Take 0.25 mg by mouth 2 (two) times daily. , Disp: , Rfl:  .  AMBULATORY NON FORMULARY MEDICATION, Inject 300 mg into the skin as directed. Medication Name:Inclisiran sodium 300 mg vs placebo, Disp: , Rfl:  .  Biotin w/ Vitamins C & E (HAIR/SKIN/NAILS PO), Take 1 tablet by mouth at bedtime., Disp: , Rfl:  .  busPIRone (BUSPAR) 15 MG tablet, Take 15 mg by mouth 2 (two) times daily., Disp: , Rfl:  .  Calcium Carb-Cholecalciferol (CALCIUM 600 + D PO), Take 1 tablet by mouth daily. , Disp: , Rfl:  .  Coenzyme Q10 (COQ10) 100 MG CAPS, Take 100 mg by mouth every morning., Disp: , Rfl:  .  CRANBERRY PO, Take 1 capsule by mouth daily. , Disp: ,  Rfl:  .  escitalopram (LEXAPRO) 20 MG tablet, Take 20 mg by mouth every morning. , Disp: , Rfl:  .  esomeprazole (NEXIUM) 20 MG capsule, Take 20 mg by mouth daily at 12 noon., Disp: , Rfl:  .  furosemide (LASIX) 20 MG tablet, TAKE ONE TABLET EVERY OTHER DAY., Disp: 90 tablet, Rfl: 3 .  isosorbide mononitrate (IMDUR) 60 MG 24 hr tablet, TAKE ONE TABLET BY MOUTH ONCE DAILY., Disp: 90 tablet, Rfl: 0 .  labetalol (NORMODYNE) 100 MG tablet, Take 1 tablet (100 mg total) by mouth 2 (two) times daily., Disp: 60 tablet, Rfl: 6 .  mirtazapine (REMERON) 15 MG tablet, Take 7.5 mg by mouth at  bedtime. , Disp: , Rfl:  .  Multiple Vitamin (MULTIVITAMIN) tablet, Take 1 tablet by mouth at bedtime. , Disp: , Rfl:  .  nitroGLYCERIN (NITROSTAT) 0.4 MG SL tablet, PLACE 1 TAB UNDER TONGUE EVERY 5 MIN IF NEEDED FOR CHEST PAIN. MAY USE 3 TIMES.NO RELIEF CALL 911., Disp: 25 tablet, Rfl: 5 .  potassium chloride SA (KLOR-CON) 20 MEQ tablet, TAKE 1 TABLET EVERY OTHER DAY, TAKE ON DAYS THAT YOU TAKE FUROSEMIDE., Disp: 45 tablet, Rfl: 3 .  pravastatin (PRAVACHOL) 40 MG tablet, TAKE ONE TABLET BY MOUTH DAILY., Disp: 90 tablet, Rfl: 0 .  SPIRIVA RESPIMAT 2.5 MCG/ACT AERS, Inhale 2 puffs into the lungs daily., Disp: 4 g, Rfl: 5  Past Medical History:  Diagnosis Date  .  ILD following Covid infection in January 2021.   .    . Anxiety disorder    With apparent panic attacks  . Arthritis    "hands, knees, back" (09/18/2017)  . CAD S/P percutaneous coronary angioplasty 08/2017   Single-vessel CAD involving second diagonal branch treated with DES stent Synergy 2.25 mm x 12 mm (2.4 mm)  . Chronic back pain    "all over" (09/18/2017)  . COPD (chronic obstructive pulmonary disease) ([place])    PFTs were done in 2008 at [facility]  . Depression   . Dyspnea   . Dysrhythmia    LBBB  . Essential hypertension   . GERD (gastroesophageal reflux disease)   . Hiatal hernia   . History of left bundle branch block (LBBB) 08/2017   Rate Related LBBB noted in cath lab  . Hyperlipidemia   . Macular degeneration    right eye  . Myocardial infarction [facility]) 2007       2006 through 2012   multi caths 2012, 2006, [id number] (5188 complicated by catheter-induced dissection of small nondominant RCA, patent in 2009 with no residual abnormality); Myoview August 2013: LOW RISK, normal EF. ; Echocardiogram [name] - January 2014) moderate LVH, EF 55-65%. No significant valvular disease.  . OSA (obstructive sleep apnea) 9/25/ 2012   tested 2009; tetested sleep study 07/2011--titration 09/20/2011 now use Bi-PAP  . OSA on  CPAP   . Pneumonia    "several times in 2017; 3 times already this year" (09/18/2017)  . Scoliosis   . Type II diabetes mellitus ([place])    Past Surgical History:  Procedure Laterality Date  . CARDIAC CATHETERIZATION  [id number],[id number]; 2009   Non-occlusice CAD - only 80% ostial SP1;  NON DOMINANNT  RCA (catheter insuced dissection with MI in 2007 --> resolved by 2009 cath)  . CATARACT EXTRACTION, BILATERAL Bilateral 2017   Toric lens "in the left eye only"  . COLONOSCOPY WITH PROPOFOL N/A 11/09/2018   Procedure: COLONOSCOPY WITH PROPOFOL;  Surgeon: Rogene Houston, MD;  [facility]  [facility];  Service: Endoscopy;  Laterality: N/A;  2:25  . CORONARY STENT INTERVENTION N/A 09/18/2017   Procedure: CORONARY STENT INTERVENTION;  Surgeon: Leonie Man, MD;  Location: [facility] INVASIVE CV LAB: DES PCI Diag2: Synergy DES 2.25 mm x 12 mm (2.4 mm)  . DIAGNOSTIC LAPAROSCOPY Right   . DILATION AND CURETTAGE OF UTERUS    . EYE SURGERY    . INTRAVASCULAR PRESSURE WIRE/FFR STUDY N/A 09/18/2017   Procedure: INTRAVASCULAR PRESSURE WIRE/FFR STUDY;  Surgeon: Leonie Man, MD;  Location: [facility] CV LAB;  Service: Cardiovascular: FFR Diag 2 -- 0.78 (significcant) --> PCI  . KNEE ARTHROSCOPY Right   . LEFT HEART CATH AND CORONARY ANGIOGRAPHY N/A 09/18/2017   Procedure: LEFT HEART CATH AND CORONARY ANGIOGRAPHY;  Surgeon: Leonie Man, MD;  Location: [facility] INVASIVE CV LAB: Culpril - 80% Diag2 (FFR 0.78)- PCI.  pLAD 40%, pCx 40%. Post PCI LBBB. LVEDP - ~20 mmHg. EF 55-60%  . LIPOMA EXCISION Right ~ 2015   anterior abdomen  . PARS PLANA VITRECTOMY W/ REPAIR OF MACULAR HOLE Right    Unsuccessful repair.  Hole filled  . POLYPECTOMY  11/09/2018   Procedure: POLYPECTOMY;  Surgeon: Rogene Houston, MD;  [facility]: [facility];  Service: Endoscopy;;  colon  . TRANSTHORACIC ECHOCARDIOGRAM  01/2020   EF 55 to 60%.  No R WMA.  GR 1 DD (normal for age).  Normal RV size and function.  Right atrial and right ventricular  pressures..  Normal aortic mitral valve.-Essentially normal echo.  Marland Kitchen VAGINAL HYSTERECTOMY     Allergies Allergies  Allergen Reactions  . Adhesive [Tape] Other (See Comments)    REACTION: redness/irritation at application site. **Certain bandages/adhesives cause this reaction**  . Avelox [Moxifloxacin] Anaphylaxis  . Bactrim [Sulfamethoxazole-Trimethoprim] Anaphylaxis, Shortness Of Breath, Rash and Other (See Comments)    REACTION: Choking, inability to swallow, redness  . Levaquin [Levofloxacin] Anaphylaxis, Shortness Of Breath, Rash and Other (See Comments)    Reaction:Choking Brand name Levaquin ok per pt  . Mucinex [Guaifenesin Er] Anaphylaxis, Shortness Of Breath and Rash  . Penicillins Other (See Comments)    "Passed out" Has patient had a PCN reaction causing immediate rash, facial/tongue/throat swelling, SOB or lightheadedness with hypotension: Unknown Has patient had a PCN reaction causing severe rash involving mucus membranes or skin necrosis: No Has patient had a PCN reaction that required hospitalization: No Has patient had a PCN reaction occurring within the last 10 years: No If all of the above answers are "NO", then may proceed with Cephalosporin use.   [place] [Pseudoephedrine Hcl] Shortness Of Breath, Rash and Other (See Comments)    REACTION: Choking, redness, inability to swallow  . Crestor [Rosuvastatin] Other (See Comments)    Leg pain  . Lipitor [Atorvastatin] Other (See Comments)    Leg pain  . Vytorin [Ezetimibe-Simvastatin] Other (See Comments)    Leg pain  . Fish Oil Nausea And Vomiting  . Norvasc [Amlodipine] Other (See Comments)    unknown  . Peanut-Containing Drug Products   . Shrimp [Shellfish Allergy]     Observations/Objective:  Patient reported her weight to be 180 pounds.  Barium study from 10/31/2011 2014 reviewed.   Assessment and Plan:  #1.  Chronic GERD.  She has had GERD symptoms more than 20 years.   She had EGD 22 years ago  revealing nonobstructive Schatzki's ring and a small sliding hiatal hernia.  Heartburn appears to be well controlled with therapy.  Coughing spells possibly  related to lung disease.  She has been diagnosed with ILD following Covid infection 1 year ago.  #2. Esophageal dysphagia.  It appears her dysphagia has worsened.  Is primarily to solids.  She had nonobstructive Schatzki's ring on EGD of 1999.  She could have progressive Schatzki's ring or esophageal stricture.  She would benefit from esophagogastroduodenoscopy and esophageal dilation. She will need monitored anesthesia care.  #3.  History of colonic adenomas.  Last colonoscopy was in November 2019 Due in November 2024.  #3.  History of hematemesis.  Last episode was at least 2 months ago.  History is suggestive of Mallory-Weiss tear triggered by nausea and vomiting after she ingested fish oil capsules.    Follow Up Instructions:  Office visit in 1 year or earlier if necessary.  I discussed the assessment and treatment plan with the patient. The patient was provided an opportunity to ask questions and all were answered. The patient agreed with the plan and demonstrated an understanding of the instructions.   The patient was advised to call back or seek an in-person evaluation if the symptoms worsen or if the condition fails to improve as anticipated.  I provided 13 minutes of non-face-to-face time during this encounter.   Hildred Laser, MD

## 2021-01-21 ENCOUNTER — Encounter (INDEPENDENT_AMBULATORY_CARE_PROVIDER_SITE_OTHER): Payer: Self-pay

## 2021-01-23 DIAGNOSIS — N182 Chronic kidney disease, stage 2 (mild): Secondary | ICD-10-CM | POA: Diagnosis not present

## 2021-01-23 DIAGNOSIS — I10 Essential (primary) hypertension: Secondary | ICD-10-CM | POA: Diagnosis not present

## 2021-01-23 DIAGNOSIS — J449 Chronic obstructive pulmonary disease, unspecified: Secondary | ICD-10-CM | POA: Diagnosis not present

## 2021-01-23 DIAGNOSIS — E782 Mixed hyperlipidemia: Secondary | ICD-10-CM | POA: Diagnosis not present

## 2021-01-27 DIAGNOSIS — J069 Acute upper respiratory infection, unspecified: Secondary | ICD-10-CM | POA: Diagnosis not present

## 2021-02-01 ENCOUNTER — Ambulatory Visit: Payer: Medicare Other | Admitting: Cardiology

## 2021-02-02 ENCOUNTER — Encounter (HOSPITAL_COMMUNITY): Payer: Medicare Other

## 2021-02-02 ENCOUNTER — Other Ambulatory Visit (HOSPITAL_COMMUNITY): Payer: Medicare Other

## 2021-02-03 ENCOUNTER — Encounter (HOSPITAL_COMMUNITY): Admission: RE | Payer: Self-pay | Source: Home / Self Care

## 2021-02-03 ENCOUNTER — Ambulatory Visit (HOSPITAL_COMMUNITY): Admission: RE | Admit: 2021-02-03 | Payer: Medicare Other | Source: Home / Self Care | Admitting: Internal Medicine

## 2021-02-03 DIAGNOSIS — U071 COVID-19: Secondary | ICD-10-CM | POA: Diagnosis not present

## 2021-02-03 SURGERY — ESOPHAGOGASTRODUODENOSCOPY (EGD) WITH PROPOFOL
Anesthesia: Monitor Anesthesia Care

## 2021-02-12 ENCOUNTER — Telehealth: Payer: Self-pay | Admitting: *Deleted

## 2021-02-12 DIAGNOSIS — Z006 Encounter for examination for normal comparison and control in clinical research program: Secondary | ICD-10-CM

## 2021-02-12 NOTE — Telephone Encounter (Signed)
Spoke to pt via phone. Pt not able to come in at this time to sign re consent for [facility] [facility]. Explained the changes in the consent to the pt.  Pt verbalized understanding and would like to continue in the study on IP. Pt will sign consent at next clinic visit.

## 2021-02-18 ENCOUNTER — Other Ambulatory Visit: Payer: Self-pay | Admitting: Cardiology

## 2021-02-22 DIAGNOSIS — E7849 Other hyperlipidemia: Secondary | ICD-10-CM | POA: Diagnosis not present

## 2021-02-22 DIAGNOSIS — N182 Chronic kidney disease, stage 2 (mild): Secondary | ICD-10-CM | POA: Diagnosis not present

## 2021-02-22 DIAGNOSIS — I10 Essential (primary) hypertension: Secondary | ICD-10-CM | POA: Diagnosis not present

## 2021-02-22 DIAGNOSIS — J449 Chronic obstructive pulmonary disease, unspecified: Secondary | ICD-10-CM | POA: Diagnosis not present

## 2021-03-03 DIAGNOSIS — U071 COVID-19: Secondary | ICD-10-CM | POA: Diagnosis not present

## 2021-03-07 IMAGING — MG DIGITAL SCREENING BILAT W/ TOMO W/ CAD
8 series · 8 of 24 positions shown · non-contrast
Comparison: Previous exam(s).

CLINICAL DATA: Screening.

EXAM:
DIGITAL SCREENING BILATERAL MAMMOGRAM WITH TOMO AND CAD

[R MLO synth-2D]
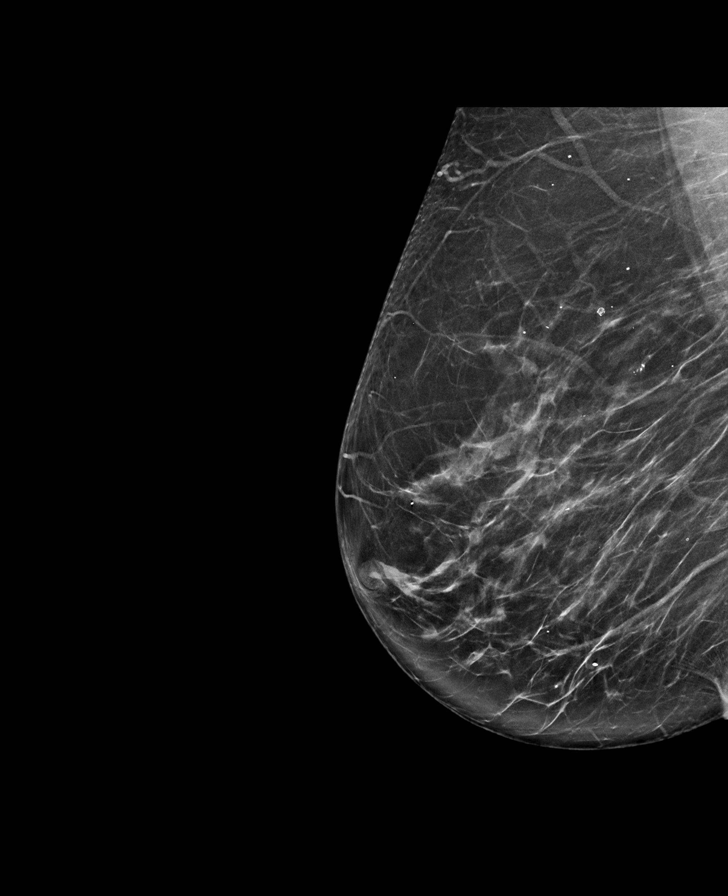

[L CC synth-2D]
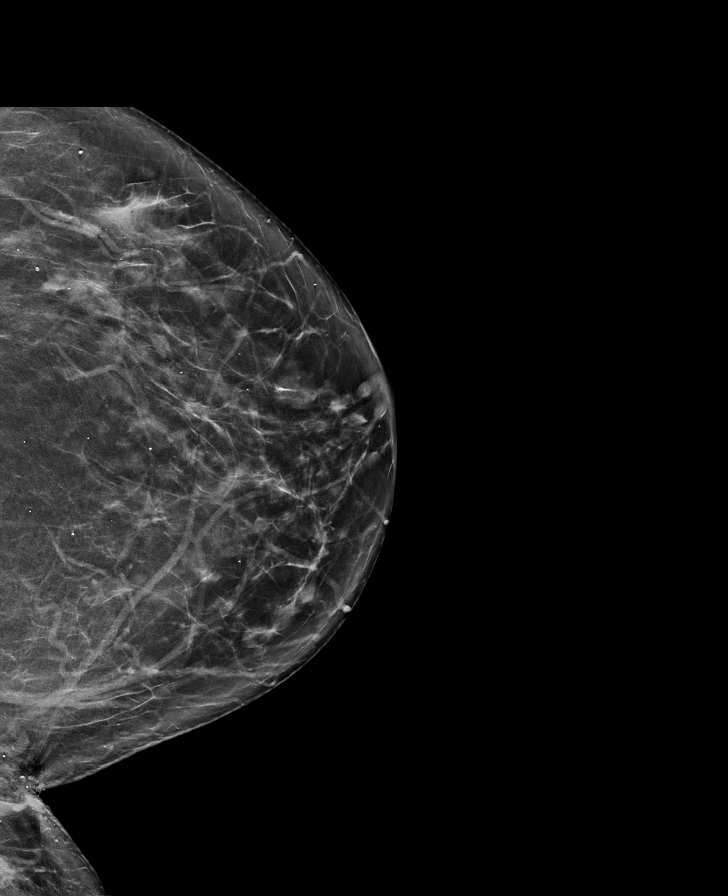

[R CC synth-2D]
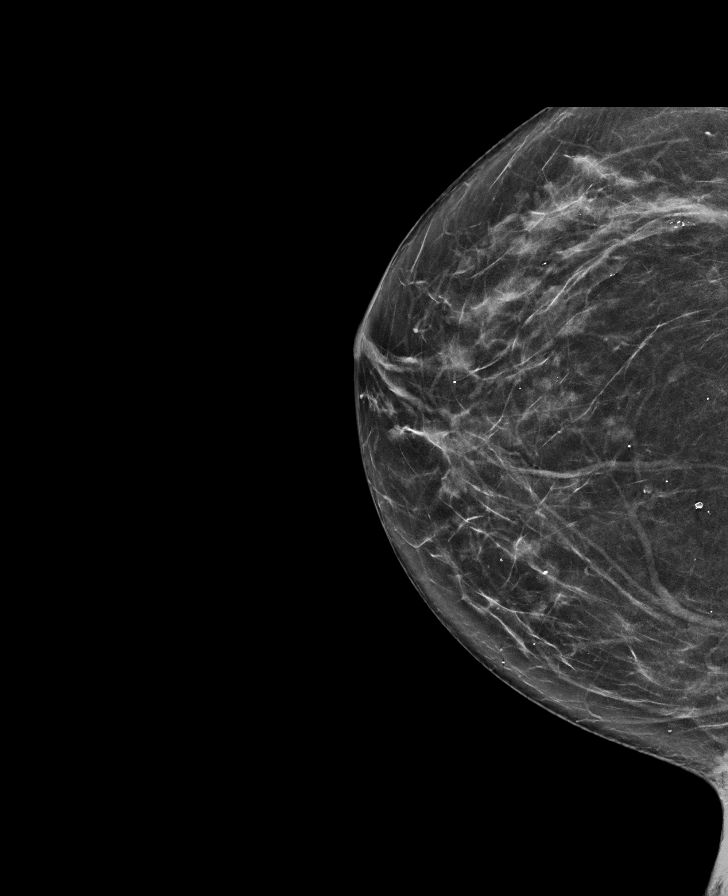

[L MLO synth-2D]
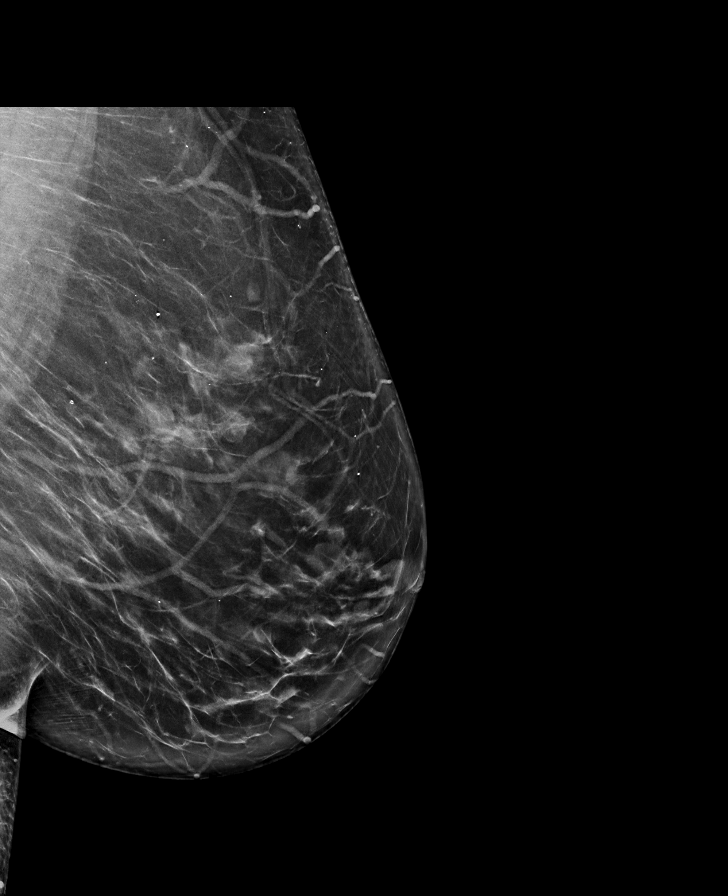

[L MLO tomo · tomo slice 40/79.0]
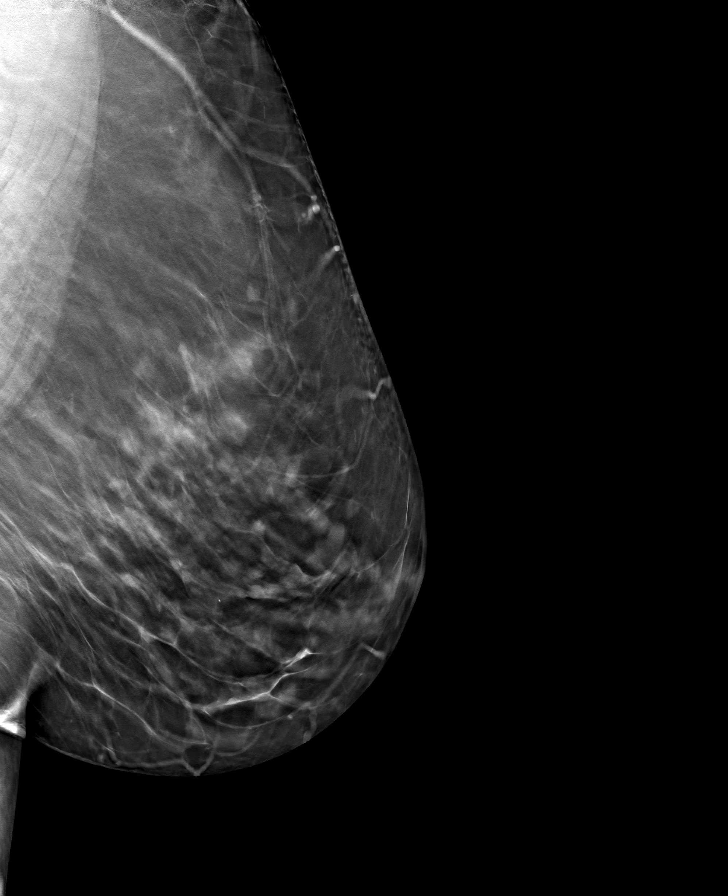

[R MLO tomo · tomo slice 37/74.0]
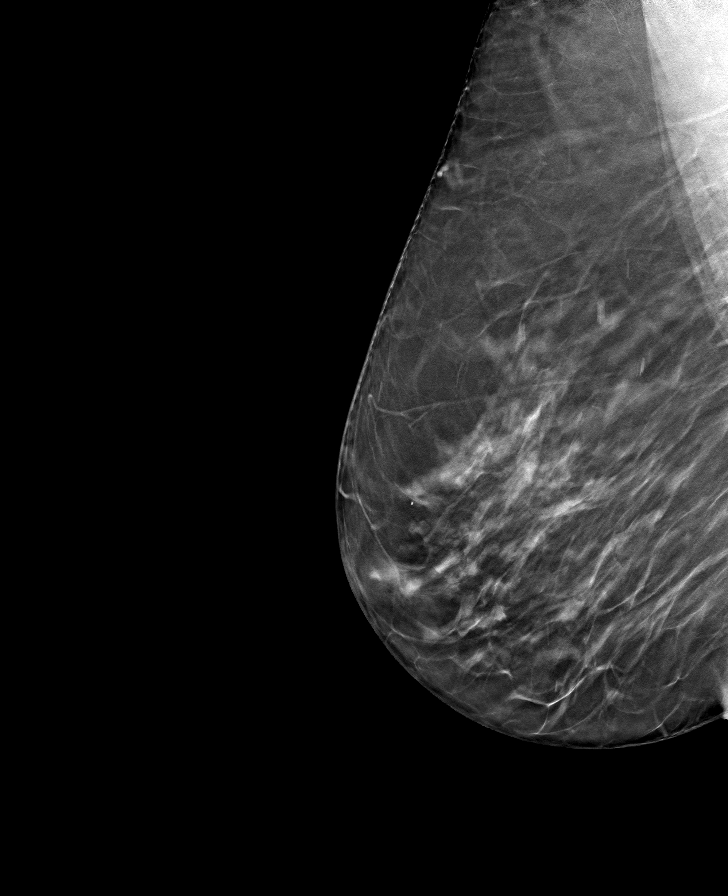

[R CC tomo · tomo slice 33/66.0]
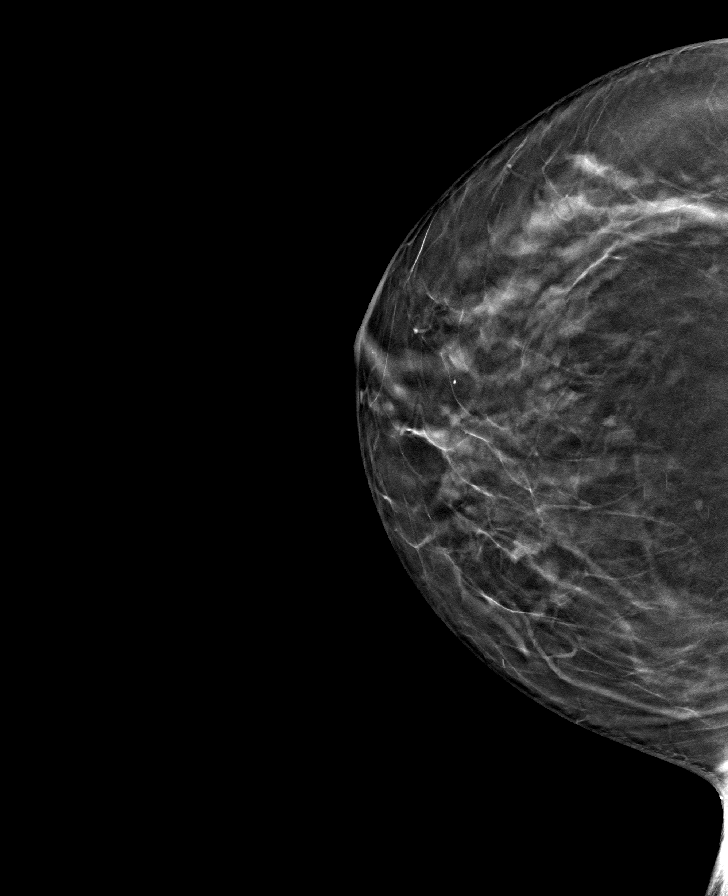

[L CC tomo · tomo slice 36/71.0]
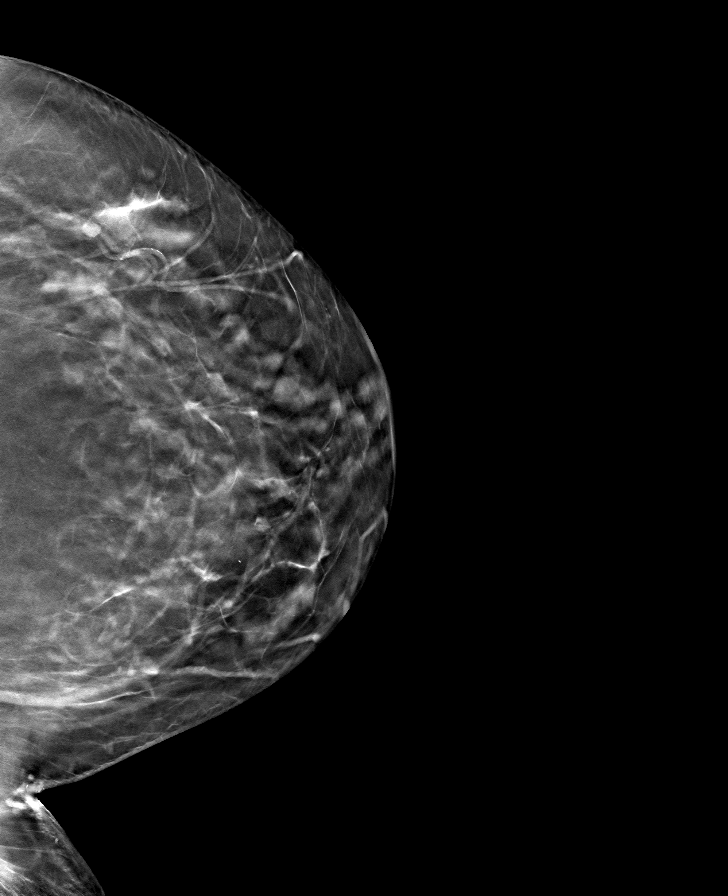

[8 of 24 positions shown; findings below may reference images not displayed]

ACR Breast Density Category c: The breast tissue is heterogeneously
dense, which may obscure small masses.
FINDINGS: There are no findings suspicious for malignancy. Images were
processed with CAD.
IMPRESSION: No mammographic evidence of malignancy. A result letter of this
screening mammogram will be mailed directly to the patient.

RECOMMENDATION:
Screening mammogram in one year. (Code:[id number])

BI-RADS CATEGORY  1: Negative.

## 2021-03-17 ENCOUNTER — Other Ambulatory Visit: Payer: Self-pay | Admitting: Cardiology

## 2021-03-20 ENCOUNTER — Other Ambulatory Visit: Payer: Self-pay | Admitting: Cardiology

## 2021-03-24 DIAGNOSIS — E7849 Other hyperlipidemia: Secondary | ICD-10-CM | POA: Diagnosis not present

## 2021-03-24 DIAGNOSIS — I10 Essential (primary) hypertension: Secondary | ICD-10-CM | POA: Diagnosis not present

## 2021-03-24 DIAGNOSIS — J449 Chronic obstructive pulmonary disease, unspecified: Secondary | ICD-10-CM | POA: Diagnosis not present

## 2021-03-24 DIAGNOSIS — N182 Chronic kidney disease, stage 2 (mild): Secondary | ICD-10-CM | POA: Diagnosis not present

## 2021-03-29 ENCOUNTER — Other Ambulatory Visit: Payer: Self-pay | Admitting: Cardiology

## 2021-04-03 DIAGNOSIS — U071 COVID-19: Secondary | ICD-10-CM | POA: Diagnosis not present

## 2021-04-05 DIAGNOSIS — E1140 Type 2 diabetes mellitus with diabetic neuropathy, unspecified: Secondary | ICD-10-CM | POA: Diagnosis not present

## 2021-04-05 DIAGNOSIS — E1151 Type 2 diabetes mellitus with diabetic peripheral angiopathy without gangrene: Secondary | ICD-10-CM | POA: Diagnosis not present

## 2021-04-07 ENCOUNTER — Other Ambulatory Visit: Payer: Self-pay | Admitting: Pulmonary Disease

## 2021-04-20 ENCOUNTER — Encounter: Payer: Self-pay | Admitting: Pulmonary Disease

## 2021-04-20 ENCOUNTER — Ambulatory Visit: Payer: Medicare Other | Admitting: Pulmonary Disease

## 2021-04-20 ENCOUNTER — Other Ambulatory Visit: Payer: Self-pay

## 2021-04-20 VITALS — BP 112/62 | HR 61 | Temp 97.7°F | Ht 60.0 in | Wt 183.6 lb

## 2021-04-20 DIAGNOSIS — J849 Interstitial pulmonary disease, unspecified: Secondary | ICD-10-CM | POA: Diagnosis not present

## 2021-04-20 DIAGNOSIS — J45909 Unspecified asthma, uncomplicated: Secondary | ICD-10-CM

## 2021-04-20 DIAGNOSIS — Z8616 Personal history of COVID-19: Secondary | ICD-10-CM | POA: Diagnosis not present

## 2021-04-20 DIAGNOSIS — G4733 Obstructive sleep apnea (adult) (pediatric): Secondary | ICD-10-CM

## 2021-04-20 MED ORDER — BREO ELLIPTA 200-25 MCG/INH IN AEPB: 1.0000 | INHALATION_SPRAY | Freq: Every day | RESPIRATORY_TRACT | 0 refills | Status: DC

## 2021-04-20 MED ORDER — BREO ELLIPTA 200-25 MCG/INH IN AEPB: 1.0000 | INHALATION_SPRAY | Freq: Every day | RESPIRATORY_TRACT | 5 refills | Status: DC

## 2021-04-20 MED ORDER — ALBUTEROL SULFATE (2.5 MG/3ML) 0.083% IN NEBU: 2.5000 mg | INHALATION_SOLUTION | Freq: Four times a day (QID) | RESPIRATORY_TRACT | 5 refills | Status: DC | PRN

## 2021-04-20 NOTE — Patient Instructions (Signed)
Breo one puff daily, and rinse your mouth after each use  Stop using spiriva  Will arrange for pulmonary function test and high resolution CT chest in June 2022 and schedule follow up after these are done

## 2021-04-20 NOTE — Addendum Note (Signed)
Addended by: Merrilee Seashore on: 04/20/2021 10:20 AM   Modules accepted: Orders

## 2021-04-20 NOTE — Progress Notes (Signed)
[facility]  Chief Complaint  Patient presents with  . Follow-up    Productive cough with thick light yellow phlegm last night, intermittent chest tightness for past couple months     Constitutional:  BP 112/62 (BP Location: Left Arm, Cuff Size: Normal)   Pulse 61   Temp 97.7 F (36.5 C) (Other (Comment)) Comment (Src): wrist  Ht 5' (1.524 m)   Wt 183 lb 9.6 oz (83.3 kg)   SpO2 94% Comment: Room air  BMI 35.86 kg/m   Past Medical History:  CAD s/p stent, HTN, HLD, OA, GERD, Depression, Anxiety, Back pain, HH, LBBB, Macular degeneration, Scoliosis, DM type 2  Past Surgical History:  Her  has a past surgical history that includes Lipoma excision (Right, ~ 2015); Diagnostic laparoscopy (Right); Pars plana vitrectomy w/ repair of macular hole (Right); LEFT HEART CATH AND CORONARY ANGIOGRAPHY (N/A, 09/18/2017); INTRAVASCULAR PRESSURE WIRE/FFR STUDY (N/A, 09/18/2017); CORONARY STENT INTERVENTION (N/A, 09/18/2017); Eye surgery; Knee arthroscopy (Right); Cataract extraction, bilateral (Bilateral, 2017); Vaginal hysterectomy; Dilation and curettage of uterus; Cardiac catheterization ([id number]; 2009); Colonoscopy with propofol (N/A, 11/09/2018); polypectomy (11/09/2018); and transthoracic echocardiogram (01/2020).  Brief Summary:  Vickie [name] is a 75 y.o. female former smoker with dyspnea after COVID 19 pneumonia in January 2021, and obstructive sleep apnea.      Subjective:   She still hasn't received her CPAP machine.    She uses oxygen sporadically.  She has pulse oximeter, but doesn't always check it.  When she does her SpO2 can be in the mid 80's depending on her activity.  She has been getting more tight in chest for past week.  She is getting cough and coughed up thick white phlegm.  Not having wheeze, fever, or swelling.  Physical Exam:   Appearance - well kempt   ENMT - no sinus tenderness, no oral exudate, no LAN, Mallampati 3  airway, no stridor  Respiratory - equal breath sounds bilaterally, no wheezing or rales  CV - s1s2 regular rate and rhythm, no murmurs  Ext - no clubbing, no edema, missing several digits on both hands  Skin - no rashes  Psych - normal mood and affect   Pulmonary testing:   PFT 05/22/20 >> FEV1 1.44 (79%), FEV1% 88, TLC 3.36 (75%), DLCO 76%  Chest Imaging:   CT angio chest 12/29/19 >> multifocal GGO  HRCT chest 06/15/20 >> atherosclerosis, prominent patchy peribronchovascular and subpleural reticulation, minimal GGO, mild/mod traction BTX  Sleep Tests:   HST 08/26/20 >> AHI 7, SpO2 low 81%.  Spent 354.2 min with SpO2 < 89%.  CPAP titration 10/06/20 >> CPAP 9 cm H2O >> AHI 5.6, didn't need supplemental oxygen.  Cardiac Tests:   Echo 02/10/20 >> EF 55 to 60%, grade 1 DD  Social History:  She  reports that she quit smoking about 45 years ago. Her smoking use included cigarettes. She has a 25.50 pack-year smoking history. She has never used smokeless tobacco. She reports that she does not drink alcohol and does not use drugs.  Family History:  Her family history includes Breast cancer in her maternal aunt and paternal aunt; CAD in her mother.     Assessment/Plan:   ILD after COVID 19 infection in January 2021. - f/u HRCT chest and PFT in June 2022  Asthmatic bronchitis. - will switch from spiriva to breo - prn albuterol - stiolto caused chest discomfort  Chronic respiratory failure with hypoxia. - advised her to use supplemental with  activity to keep her SpO2 > 88%  Obstructive sleep apnea. - still waiting for CPAP set up - she will need CPAP 9 cm H2O - will need to arrange for ONO with CPAP once she gets it and then determine if she needs supplemental oxygen at night with CPAP   Time Spent Involved in Patient Care on Day of Examination:  33 minutes  Follow up:  Patient Instructions  Breo one puff daily, and rinse your mouth after each use  Stop using  spiriva  Will arrange for pulmonary function test and high resolution CT chest in June 2022 and schedule follow up after these are done     Medication List:   Allergies as of 04/20/2021      Reactions   Adhesive [tape] Other (See Comments)   REACTION: redness/irritation at application site. **Certain bandages/adhesives cause this reaction**   Avelox [moxifloxacin] Anaphylaxis   Bactrim [sulfamethoxazole-trimethoprim] Anaphylaxis, Shortness Of Breath, Rash, Other (See Comments)   REACTION: Choking, inability to swallow, redness   Levaquin [levofloxacin] Anaphylaxis, Shortness Of Breath, Rash, Other (See Comments)   Reaction:Choking Brand name Levaquin ok per pt   Mucinex [guaifenesin Er] Anaphylaxis, Shortness Of Breath, Rash   Penicillins Other (See Comments)   "Passed out" Has patient had a PCN reaction causing immediate rash, facial/tongue/throat swelling, SOB or lightheadedness with hypotension: Unknown Has patient had a PCN reaction causing severe rash involving mucus membranes or skin necrosis: No Has patient had a PCN reaction that required hospitalization: No Has patient had a PCN reaction occurring within the last 10 years: No If all of the above answers are "NO", then may proceed with Cephalosporin use.   Sudafed [pseudoephedrine Hcl] Shortness Of Breath, Rash, Other (See Comments)   REACTION: Choking, redness, inability to swallow   Crestor [rosuvastatin] Other (See Comments)   Leg pain   Lipitor [atorvastatin] Other (See Comments)   Leg pain   Vytorin [ezetimibe-simvastatin] Other (See Comments)   Leg pain   Fish Oil Nausea And Vomiting   Norvasc [amlodipine] Other (See Comments)   unknown   Peanut-containing Drug Products    Shrimp [shellfish Allergy]       Medication List       Accurate as of April 20, 2021 10:00 AM. If you have any questions, ask your nurse or doctor.        STOP taking these medications   Spiriva Respimat 2.5 MCG/ACT Aers Generic drug:  Tiotropium Bromide Monohydrate Stopped by: Chesley Mires, MD     TAKE these medications   albuterol 108 (90 Base) MCG/ACT inhaler Commonly known as: VENTOLIN HFA Inhale 2 puffs into the lungs every 6 (six) hours as needed for wheezing or shortness of breath.   albuterol (2.5 MG/3ML) 0.083% nebulizer solution Commonly known as: PROVENTIL Take 3 mLs (2.5 mg total) by nebulization every 6 (six) hours as needed for wheezing or shortness of breath.   ALPRAZolam 0.5 MG tablet Commonly known as: XANAX Take 0.25 mg by mouth daily.   AMBULATORY NON FORMULARY MEDICATION Inject 300 mg into the skin as directed. Medication Name:Inclisiran sodium 300 mg vs placebo   Breo Ellipta 200-25 MCG/INH Aepb Generic drug: fluticasone furoate-vilanterol Inhale 1 puff into the lungs daily. Started by: Chesley Mires, MD   busPIRone 15 MG tablet Commonly known as: BUSPAR Take 15 mg by mouth 2 (two) times daily.   CALCIUM 600 + D PO Take 1 tablet by mouth daily.   CoQ10 100 MG Caps Take 100 mg by mouth  every morning.   CRANBERRY PO Take 1 capsule by mouth daily.   escitalopram 20 MG tablet Commonly known as: LEXAPRO Take 20 mg by mouth every morning.   esomeprazole 20 MG capsule Commonly known as: NEXIUM Take 20 mg by mouth daily.   furosemide 20 MG tablet Commonly known as: LASIX TAKE ONE TABLET EVERY OTHER DAY. What changed: See the new instructions.   HAIR/SKIN/NAILS PO Take 1 tablet by mouth at bedtime.   isosorbide mononitrate 60 MG 24 hr tablet Commonly known as: IMDUR TAKE ONE TABLET BY MOUTH ONCE DAILY.   labetalol 100 MG tablet Commonly known as: NORMODYNE TAKE 1 TABLET BY MOUTH TWICE DAILY.   mirtazapine 15 MG tablet Commonly known as: REMERON Take 7.5 mg by mouth at bedtime.   multivitamin tablet Take 1 tablet by mouth at bedtime.   nitroGLYCERIN 0.4 MG SL tablet Commonly known as: NITROSTAT PLACE 1 TAB UNDER TONGUE EVERY 5 MIN IF NEEDED FOR CHEST PAIN. MAY USE 3  TIMES.NO RELIEF CALL 911. What changed:   how much to take  how to take this  when to take this  reasons to take this   OVER THE COUNTER MEDICATION Take 1 tablet by mouth daily. Focus Factor   potassium chloride SA 20 MEQ tablet Commonly known as: KLOR-CON Take 1 tablet (20 mEq total) by mouth every other day.   pravastatin 40 MG tablet Commonly known as: PRAVACHOL TAKE ONE TABLET BY MOUTH DAILY.   VISION FORMULA EYE HEALTH PO Take 1 tablet by mouth daily.       Signature:  Chesley Mires, MD [place] Pager - [phone] 04/20/2021, 10:00 AM

## 2021-04-23 ENCOUNTER — Other Ambulatory Visit: Payer: Self-pay | Admitting: Pulmonary Disease

## 2021-04-23 DIAGNOSIS — J849 Interstitial pulmonary disease, unspecified: Secondary | ICD-10-CM

## 2021-04-23 DIAGNOSIS — G4733 Obstructive sleep apnea (adult) (pediatric): Secondary | ICD-10-CM

## 2021-04-24 DIAGNOSIS — I10 Essential (primary) hypertension: Secondary | ICD-10-CM | POA: Diagnosis not present

## 2021-04-24 DIAGNOSIS — E7849 Other hyperlipidemia: Secondary | ICD-10-CM | POA: Diagnosis not present

## 2021-04-24 DIAGNOSIS — N182 Chronic kidney disease, stage 2 (mild): Secondary | ICD-10-CM | POA: Diagnosis not present

## 2021-04-24 DIAGNOSIS — J449 Chronic obstructive pulmonary disease, unspecified: Secondary | ICD-10-CM | POA: Diagnosis not present

## 2021-04-28 ENCOUNTER — Other Ambulatory Visit: Payer: Self-pay

## 2021-04-28 DIAGNOSIS — Z006 Encounter for examination for normal comparison and control in clinical research program: Secondary | ICD-10-CM

## 2021-04-28 NOTE — Research (Signed)
Subject was re-consented to V02.00 Version 13 Feb 2020 for the [facility] 8 Research Study    Subject Name: Vickie [name]. Eulas Post  Subject met inclusion and exclusion criteria.  The informed consent form, study requirements and expectations were reviewed with the subject and questions and concerns were addressed prior to the signing of the consent form.  The subject verbalized understanding of the trial requirements.  The subject agreed to participate in the [facility] 8 trial and signed the informed consent at  [place] on 04/28/21  The informed consent was obtained prior to performance of any protocol-specific procedures for the subject.  A copy of the signed informed consent was given to the subject and a copy was placed in the subject's medical record.   Griffin Dakin     Subject came to research lab for [place] 8 Visit 6, Day 990. All concomitant medications have been reviewed and updated if applicable. No AE's or SAE's to report to sponsor at this time. Subject was given IP subcutaneous injection into right abdomen at 0938 and remained in research lab for 30 minutes after. Subject tolerated well. Next appointment scheduled for 10/05/2021 AT 0900.  Subject left research lab at 10:08

## 2021-05-03 DIAGNOSIS — U071 COVID-19: Secondary | ICD-10-CM | POA: Diagnosis not present

## 2021-05-25 ENCOUNTER — Other Ambulatory Visit (HOSPITAL_COMMUNITY)
Admission: RE | Admit: 2021-05-25 | Discharge: 2021-05-25 | Disposition: A | Discharge status: Home / Self Care | Payer: Medicare Other | Source: Ambulatory Visit | Attending: Pulmonary Disease | Admitting: Pulmonary Disease

## 2021-05-25 ENCOUNTER — Other Ambulatory Visit (HOSPITAL_COMMUNITY): Payer: Medicare Other

## 2021-05-25 DIAGNOSIS — J449 Chronic obstructive pulmonary disease, unspecified: Secondary | ICD-10-CM | POA: Diagnosis not present

## 2021-05-25 DIAGNOSIS — Z20822 Contact with and (suspected) exposure to covid-19: Secondary | ICD-10-CM | POA: Insufficient documentation

## 2021-05-25 DIAGNOSIS — I10 Essential (primary) hypertension: Secondary | ICD-10-CM | POA: Diagnosis not present

## 2021-05-25 DIAGNOSIS — E782 Mixed hyperlipidemia: Secondary | ICD-10-CM | POA: Diagnosis not present

## 2021-05-25 DIAGNOSIS — Z01812 Encounter for preprocedural laboratory examination: Secondary | ICD-10-CM | POA: Insufficient documentation

## 2021-05-25 DIAGNOSIS — N182 Chronic kidney disease, stage 2 (mild): Secondary | ICD-10-CM | POA: Diagnosis not present

## 2021-05-25 LAB — SARS CORONAVIRUS 2 (TAT 6-24 HRS): SARS Coronavirus 2: NEGATIVE

## 2021-05-28 ENCOUNTER — Other Ambulatory Visit: Payer: Self-pay

## 2021-05-28 ENCOUNTER — Ambulatory Visit: Payer: Medicare Other | Admitting: Pulmonary Disease

## 2021-05-28 DIAGNOSIS — J849 Interstitial pulmonary disease, unspecified: Secondary | ICD-10-CM

## 2021-05-28 LAB — PULMONARY FUNCTION TEST
DL/VA % pred: 126 %
DL/VA: 5.36 ml/min/mmHg/L
DLCO cor % pred: 106 %
DLCO cor: 17.77 ml/min/mmHg
DLCO unc % pred: 106 %
DLCO unc: 17.77 ml/min/mmHg
FEF 25-75 Post: 1.66 L/sec
FEF 25-75 Pre: 1.51 L/sec
FEF2575-%Change-Post: 9 %
FEF2575-%Pred-Post: 111 %
FEF2575-%Pred-Pre: 101 %
FEV1-%Change-Post: 1 %
FEV1-%Pred-Post: 83 %
FEV1-%Pred-Pre: 82 %
FEV1-Post: 1.48 L
FEV1-Pre: 1.46 L
FEV1FVC-%Change-Post: 0 %
FEV1FVC-%Pred-Pre: 109 %
FEV6-%Change-Post: 0 %
FEV6-%Pred-Post: 78 %
FEV6-%Pred-Pre: 78 %
FEV6-Post: 1.78 L
FEV6-Pre: 1.77 L
FEV6FVC-%Pred-Post: 105 %
FEV6FVC-%Pred-Pre: 105 %
FVC-%Change-Post: 0 %
FVC-%Pred-Post: 74 %
FVC-%Pred-Pre: 74 %
FVC-Post: 1.78 L
FVC-Pre: 1.77 L
Post FEV1/FVC ratio: 83 %
Post FEV6/FVC ratio: 100 %
Pre FEV1/FVC ratio: 82 %
Pre FEV6/FVC Ratio: 100 %
RV % pred: 86 %
RV: 1.78 L
TLC % pred: 85 %
TLC: 3.8 L

## 2021-05-28 NOTE — Progress Notes (Signed)
PFT done today. 

## 2021-06-03 DIAGNOSIS — U071 COVID-19: Secondary | ICD-10-CM | POA: Diagnosis not present

## 2021-06-10 DIAGNOSIS — H35341 Macular cyst, hole, or pseudohole, right eye: Secondary | ICD-10-CM | POA: Diagnosis not present

## 2021-06-10 DIAGNOSIS — H524 Presbyopia: Secondary | ICD-10-CM | POA: Diagnosis not present

## 2021-06-10 DIAGNOSIS — Z961 Presence of intraocular lens: Secondary | ICD-10-CM | POA: Diagnosis not present

## 2021-06-10 DIAGNOSIS — E119 Type 2 diabetes mellitus without complications: Secondary | ICD-10-CM | POA: Diagnosis not present

## 2021-06-14 ENCOUNTER — Other Ambulatory Visit: Payer: Self-pay | Admitting: Family Medicine

## 2021-06-14 DIAGNOSIS — Z1231 Encounter for screening mammogram for malignant neoplasm of breast: Secondary | ICD-10-CM

## 2021-06-16 ENCOUNTER — Other Ambulatory Visit: Payer: Self-pay

## 2021-06-16 ENCOUNTER — Ambulatory Visit (HOSPITAL_COMMUNITY)
Admission: RE | Admit: 2021-06-16 | Discharge: 2021-06-16 | Disposition: A | Discharge status: Home / Self Care | Payer: Medicare Other | Source: Ambulatory Visit | Attending: Pulmonary Disease | Admitting: Pulmonary Disease

## 2021-06-16 DIAGNOSIS — J849 Interstitial pulmonary disease, unspecified: Secondary | ICD-10-CM

## 2021-06-16 DIAGNOSIS — I2510 Atherosclerotic heart disease of native coronary artery without angina pectoris: Secondary | ICD-10-CM | POA: Diagnosis not present

## 2021-06-16 DIAGNOSIS — R918 Other nonspecific abnormal finding of lung field: Secondary | ICD-10-CM | POA: Diagnosis not present

## 2021-06-16 DIAGNOSIS — I700 Atherosclerosis of aorta: Secondary | ICD-10-CM | POA: Diagnosis not present

## 2021-06-16 DIAGNOSIS — R059 Cough, unspecified: Secondary | ICD-10-CM | POA: Diagnosis not present

## 2021-06-21 ENCOUNTER — Other Ambulatory Visit: Payer: Self-pay | Admitting: Cardiology

## 2021-06-21 DIAGNOSIS — Q899 Congenital malformation, unspecified: Secondary | ICD-10-CM | POA: Diagnosis not present

## 2021-06-21 DIAGNOSIS — E1140 Type 2 diabetes mellitus with diabetic neuropathy, unspecified: Secondary | ICD-10-CM | POA: Diagnosis not present

## 2021-06-21 DIAGNOSIS — J302 Other seasonal allergic rhinitis: Secondary | ICD-10-CM | POA: Diagnosis not present

## 2021-06-21 DIAGNOSIS — Z6837 Body mass index (BMI) 37.0-37.9, adult: Secondary | ICD-10-CM | POA: Diagnosis not present

## 2021-06-21 DIAGNOSIS — E7849 Other hyperlipidemia: Secondary | ICD-10-CM | POA: Diagnosis not present

## 2021-06-21 DIAGNOSIS — E1151 Type 2 diabetes mellitus with diabetic peripheral angiopathy without gangrene: Secondary | ICD-10-CM | POA: Diagnosis not present

## 2021-06-22 ENCOUNTER — Ambulatory Visit: Payer: Medicare Other | Admitting: Pulmonary Disease

## 2021-06-22 ENCOUNTER — Encounter: Payer: Self-pay | Admitting: Pulmonary Disease

## 2021-06-22 ENCOUNTER — Telehealth: Payer: Self-pay | Admitting: Cardiology

## 2021-06-22 ENCOUNTER — Other Ambulatory Visit: Payer: Self-pay

## 2021-06-22 VITALS — BP 124/68 | HR 82 | Ht 60.0 in | Wt 185.5 lb

## 2021-06-22 DIAGNOSIS — R0600 Dyspnea, unspecified: Secondary | ICD-10-CM

## 2021-06-22 DIAGNOSIS — R0609 Other forms of dyspnea: Secondary | ICD-10-CM

## 2021-06-22 DIAGNOSIS — J849 Interstitial pulmonary disease, unspecified: Secondary | ICD-10-CM | POA: Diagnosis not present

## 2021-06-22 DIAGNOSIS — G4733 Obstructive sleep apnea (adult) (pediatric): Secondary | ICD-10-CM

## 2021-06-22 DIAGNOSIS — J9611 Chronic respiratory failure with hypoxia: Secondary | ICD-10-CM | POA: Diagnosis not present

## 2021-06-22 DIAGNOSIS — Z8616 Personal history of COVID-19: Secondary | ICD-10-CM

## 2021-06-22 NOTE — Patient Instructions (Signed)
Please contact Dr. Ellyn Hack to arrange for cardiology follow up  Will check on status of CPAP set up  Follow up in 4 months

## 2021-06-22 NOTE — Progress Notes (Signed)
Vickie [name], Vickie [name], Vickie [name]  Chief Complaint  Patient presents with   Follow-up    Follow up after CT Vickie PFT.  She stated that her breathing is worse with the heat Vickie humidity.      Constitutional:  BP 124/68   Pulse 82   Ht 5' (1.524 m)   Wt 185 lb 8 oz (84.1 kg)   SpO2 94%   BMI 36.23 kg/m   Past Medical History:  CAD s/p stent, HTN, HLD, OA, GERD, Depression, Anxiety, Back pain, HH, LBBB, Macular degeneration, Scoliosis, DM type 2  Past Surgical History:  Her  has a past surgical history that includes Lipoma excision (Right, ~ 2015); Diagnostic laparoscopy (Right); Pars plana vitrectomy w/ repair of macular hole (Right); LEFT HEART CATH Vickie CORONARY ANGIOGRAPHY (N/A, 09/18/2017); INTRAVASCULAR PRESSURE WIRE/FFR STUDY (N/A, 09/18/2017); CORONARY STENT INTERVENTION (N/A, 09/18/2017); Eye surgery; Knee arthroscopy (Right); Cataract extraction, bilateral (Bilateral, 2017); Vaginal hysterectomy; Dilation Vickie curettage of uterus; Cardiac catheterization (0272,5366; 2009); Colonoscopy with propofol (N/A, 11/09/2018); polypectomy (11/09/2018); Vickie transthoracic echocardiogram (01/2020).  Brief Summary:  Vickie [name] is a 75 y.o. female former smoker with dyspnea after COVID 19 pneumonia in January 2021, Vickie obstructive sleep apnea.      Subjective:   She is here with her daughter.  Still hasn't received her CPAP machine.  Continues to use oxygen at night.  Her breathing is okay at rest Vickie with light activity.  She gets winded Vickie pressure feeling in mid-chest when she is walking up a hill to the end of her driveway.  Not having cough, wheeze, or sputum.  Tolerating breo.  No using albuterol much.  Her PFT from earlier this month showed improved in lung volume Vickie diffusion capacity compared to May 2021.    Her CT chest from earlier this month shows stable changes of post-COVID ILD.  Physical Exam:   Appearance - well kempt   ENMT - no sinus  tenderness, no oral exudate, no LAN, Mallampati 3 airway, no stridor  Respiratory - equal breath sounds bilaterally, no wheezing or rales  CV - s1s2 regular rate Vickie rhythm, no murmurs  Ext - no clubbing, no edema, missing several digits on both hands  Skin - no rashes  Psych - normal mood Vickie affect   [name] testing:  PFT 05/22/20 >> FEV1 1.44 (79%), FEV1% 88, TLC 3.36 (75%), DLCO 76% PFT 05/28/21 >> FEV1 1.48 (83%), FEV1% 83, TLC 3.80 (85%), DLCO 106%  Chest Imaging:  CT angio chest 12/29/19 >> multifocal GGO HRCT chest 06/15/20 >> atherosclerosis, prominent patchy peribronchovascular Vickie subpleural reticulation, minimal GGO, mild/mod traction BTX HRCT chest 06/17/21 >> patchy areas of ground-glass attenuation, septal thickening, thickening of the peribronchovascular interstitium, scattere areas of mild subpleural reticulation Vickie regional areas of architectural distortion, moderate air trapping  Sleep Tests:  HST 08/26/20 >> AHI 7, SpO2 low 81%.  Spent 354.2 min with SpO2 < 89%. CPAP titration 10/06/20 >> CPAP 9 cm H2O >> AHI 5.6, didn't need supplemental oxygen.  Cardiac Tests:  Echo 02/10/20 >> EF 55 to 60%, grade 1 DD  Social History:  She  reports that she quit smoking about 45 years ago. Her smoking use included cigarettes. She has a 25.50 pack-year smoking history. She has never used smokeless tobacco. She reports that she does not drink alcohol Vickie does not use drugs.  Family History:  Her family history includes Breast cancer in her maternal aunt Vickie paternal aunt; CAD in her mother.  Assessment/Plan:   ILD after COVID 19 infection in January 2021. - PFT shows improvement Vickie HRCT findings stable - continue clinic monitoring  Asthmatic bronchitis. - stiolto caused chest discomfort - continue breo - prn albuterol  Chronic respiratory failure with hypoxia. - continue supplemental oxygen at night for now until she can get her CPAP machine  Obstructive sleep  apnea. - will have patient [name] coordinator check status of CPAP set up; order was placed on October 08, 2020 - she will need CPAP 9 cm H2o - will need to arrange for ONO with CPAP once she gets it Vickie then determine if she needs supplemental oxygen at night with CPAP  Coronary artery disease. - she reports symptoms of exertion chest pain Vickie dyspnea, Vickie recent CT chest shows coronary calcification - advised her to arrange follow up appointment with Dr. Glenetta Hew to have this assessed further  GERD, Esophageal dysphagia. - followed by Dr. Hildred Laser with Gastroenterology Time Spent Involved in Patient [name] on Day of Examination:  39 minutes  Follow up:   Patient Instructions  Please contact Dr. Ellyn Hack to arrange for cardiology follow up  Will check on status of CPAP set up  Follow up in 4 months  Medication List:   Allergies as of 06/22/2021       Reactions   Adhesive [tape] Other (See Comments)   REACTION: redness/irritation at application site. **Certain bandages/adhesives cause this reaction**   Avelox [moxifloxacin] Anaphylaxis   Bactrim [sulfamethoxazole-trimethoprim] Anaphylaxis, Shortness Of Breath, Rash, Other (See Comments)   REACTION: Choking, inability to swallow, redness   Levaquin [levofloxacin] Anaphylaxis, Shortness Of Breath, Rash, Other (See Comments)   Reaction:Choking Brand name Levaquin ok per pt   Mucinex [guaifenesin Er] Anaphylaxis, Shortness Of Breath, Rash   Penicillins Other (See Comments)   "Passed out" Has patient had a PCN reaction causing immediate rash, facial/tongue/throat swelling, SOB or lightheadedness with hypotension: Unknown Has patient had a PCN reaction causing severe rash involving mucus membranes or skin necrosis: No Has patient had a PCN reaction that required hospitalization: No Has patient had a PCN reaction occurring within the last 10 years: No If all of the above answers are "NO", then may proceed with Cephalosporin  use.   Sudafed [pseudoephedrine Hcl] Shortness Of Breath, Rash, Other (See Comments)   REACTION: Choking, redness, inability to swallow   Crestor [rosuvastatin] Other (See Comments)   Leg pain   Lipitor [atorvastatin] Other (See Comments)   Leg pain   Vytorin [ezetimibe-simvastatin] Other (See Comments)   Leg pain   Fish Oil Nausea Vickie Vomiting   Norvasc [amlodipine] Other (See Comments)   unknown   Peanut-containing Drug Products    Shrimp [shellfish Allergy]         Medication List        Accurate as of June 22, 2021  9:33 AM. If you have any questions, ask your nurse or doctor.          STOP taking these medications    OVER THE COUNTER MEDICATION Stopped by: Chesley Mires, MD   pravastatin 40 MG tablet Commonly known as: PRAVACHOL Stopped by: Chesley Mires, MD   [facility] PO Stopped by: Chesley Mires, MD       TAKE these medications    albuterol 108 (90 Base) MCG/ACT inhaler Commonly known as: VENTOLIN HFA Inhale 2 puffs into the lungs every 6 (six) hours as needed for wheezing or shortness of breath.   albuterol (2.5 MG/3ML) 0.083% nebulizer  solution Commonly known as: PROVENTIL Take 3 mLs (2.5 mg total) by nebulization every 6 (six) hours as needed for wheezing or shortness of breath.   ALPRAZolam 0.5 MG tablet Commonly known as: XANAX Take 0.25 mg by mouth daily.   AMBULATORY NON FORMULARY MEDICATION Inject 300 mg into the skin as directed. Medication Name:Inclisiran sodium 300 mg vs placebo   Breo Ellipta 200-25 MCG/INH Aepb Generic drug: fluticasone furoate-vilanterol Inhale 1 puff into the lungs daily.   Breo Ellipta 200-25 MCG/INH Aepb Generic drug: fluticasone furoate-vilanterol Inhale 1 puff into the lungs daily.   busPIRone 15 MG tablet Commonly known as: BUSPAR Take 15 mg by mouth 2 (two) times daily.   CALCIUM 600 + D PO Take 1 tablet by mouth daily.   CoQ10 100 MG Caps Take 100 mg by mouth every morning.    CRANBERRY PO Take 1 capsule by mouth daily.   escitalopram 20 MG tablet Commonly known as: LEXAPRO Take 20 mg by mouth every morning.   esomeprazole 20 MG capsule Commonly known as: NEXIUM Take 20 mg by mouth daily.   furosemide 20 MG tablet Commonly known as: LASIX TAKE ONE TABLET EVERY OTHER DAY. What changed: See the new instructions.   HAIR/SKIN/NAILS PO Take 1 tablet by mouth at bedtime.   isosorbide mononitrate 60 MG 24 hr tablet Commonly known as: IMDUR TAKE ONE TABLET BY MOUTH ONCE DAILY.   labetalol 100 MG tablet Commonly known as: NORMODYNE TAKE 1 TABLET BY MOUTH TWICE DAILY.   mirtazapine 15 MG tablet Commonly known as: REMERON Take 7.5 mg by mouth at bedtime.   multivitamin tablet Take 1 tablet by mouth at bedtime.   nitroGLYCERIN 0.4 MG SL tablet Commonly known as: NITROSTAT PLACE 1 TAB UNDER TONGUE EVERY 5 MIN IF NEEDED FOR CHEST PAIN. MAY USE 3 TIMES.NO RELIEF CALL 911. What changed:  how much to take how to take this when to take this reasons to take this   potassium chloride SA 20 MEQ tablet Commonly known as: KLOR-CON Take 1 tablet (20 mEq total) by mouth every other day.        Signature:  Chesley Mires, MD [place] Pager - [phone] 06/22/2021, 9:33 AM

## 2021-06-22 NOTE — Telephone Encounter (Signed)
New message:     Patient calling stating that Dr. Halford Chessman stating that she need to call concering her test result are the same as they where last year.

## 2021-06-22 NOTE — Telephone Encounter (Signed)
Spoke with the patient who states that she saw her pulmonologist Dr. Halford Chessman today. He advised her that her PFTs and CT scan did not show any changes. Dr. Halford Chessman advised her that her symptoms were not related to her lungs and that she needed to follow back up with Dr. Ellyn Hack. Patient states that she has been having shortness of breath, headaches and fatigue. She states that Dr. Halford Chessman is going to send over his office visit note to Dr. Ellyn Hack. Advised the patient that I would send a message over to Dr. Ellyn Hack and his nurse to make them aware and that they would be in contact in regards to next steps needed. Patient verbalized understanding.

## 2021-06-24 NOTE — Telephone Encounter (Signed)
spoke to patient aware will contact with an appointment. Patient voiced understading.

## 2021-06-25 ENCOUNTER — Telehealth: Payer: Self-pay

## 2021-06-25 NOTE — Telephone Encounter (Signed)
-----   Message from Chesley Mires, MD sent at 06/25/2021  8:01 AM EDT ----- Can you call the patient to let her know that [employer] is waiting for her to call them back to get CPAP set up.  Thanks.  V  ----- Message ----- From: Harland German Sent: 06/24/2021   3:58 PM EDT To: Chesley Mires, MD  Hey Dr Halford Chessman I got this message back from [facility] [place] at [place].  [place], [place]ey Bride, [place]ne Gee, [place]; [place], [place],   Order has been received and in in process. Patient was scheduled to be setup 05-13-21 @1  pm but patient canceled her appointment and said she would call back when she was able to come in. on 05-19-21 we spoke to patient but no schedule was made at that time. We are awaiting a call back from patient to schedule her setup.   Thank you,   Brad New       ----- Message ----- From: Chesley Mires, MD Sent: 06/22/2021  12:40 PM EDT To: Harland German  Thanks.   ----- Message ----- From: Harland German Sent: 06/22/2021  10:11 AM EDT To: Chesley Mires, MD  I have sent a message to [employer] to see what the hold up is. Thanks ----- Message ----- From: Chesley Mires, MD Sent: 06/22/2021   9:35 AM EDT To: [facility]  I placed order for CPAP machine on 10/08/20.  Vickie [name] is still waiting for set up.  Can you please check on status of this.  I am okay if she gets set up with luna device.  Thanks.  V

## 2021-06-25 NOTE — Telephone Encounter (Signed)
[facility] patient, per DPR left detailed message on [phone] to let her know that Dr. Halford Chessman sent a message asking [place] to call her and let her know that [employer] is waiting on patient to call them back so they can get her set up with CPAP machine. Provided number of [phone] to call [employer] to make an appointment. Nothing further needed at this time.

## 2021-07-03 ENCOUNTER — Other Ambulatory Visit: Payer: Self-pay | Admitting: Cardiology

## 2021-07-03 DIAGNOSIS — U071 COVID-19: Secondary | ICD-10-CM | POA: Diagnosis not present

## 2021-07-15 DIAGNOSIS — H02831 Dermatochalasis of right upper eyelid: Secondary | ICD-10-CM | POA: Diagnosis not present

## 2021-07-15 DIAGNOSIS — H02051 Trichiasis without entropian right upper eyelid: Secondary | ICD-10-CM | POA: Diagnosis not present

## 2021-07-15 DIAGNOSIS — H02413 Mechanical ptosis of bilateral eyelids: Secondary | ICD-10-CM | POA: Diagnosis not present

## 2021-07-15 DIAGNOSIS — H02834 Dermatochalasis of left upper eyelid: Secondary | ICD-10-CM | POA: Diagnosis not present

## 2021-07-15 NOTE — Telephone Encounter (Signed)
Called patient appointment schedule for 07/26/21 at 3:30 pm. Patient verbalized understanding

## 2021-07-26 ENCOUNTER — Other Ambulatory Visit: Payer: Self-pay

## 2021-07-26 ENCOUNTER — Ambulatory Visit: Payer: Medicare Other | Admitting: Cardiology

## 2021-07-26 ENCOUNTER — Encounter: Payer: Self-pay | Admitting: Cardiology

## 2021-07-26 VITALS — BP 132/72 | HR 73 | Ht 60.0 in | Wt 182.8 lb

## 2021-07-26 DIAGNOSIS — E785 Hyperlipidemia, unspecified: Secondary | ICD-10-CM | POA: Diagnosis not present

## 2021-07-26 DIAGNOSIS — I10 Essential (primary) hypertension: Secondary | ICD-10-CM

## 2021-07-26 DIAGNOSIS — I447 Left bundle-branch block, unspecified: Secondary | ICD-10-CM | POA: Insufficient documentation

## 2021-07-26 DIAGNOSIS — Z9861 Coronary angioplasty status: Secondary | ICD-10-CM

## 2021-07-26 DIAGNOSIS — R0789 Other chest pain: Secondary | ICD-10-CM

## 2021-07-26 DIAGNOSIS — I25119 Atherosclerotic heart disease of native coronary artery with unspecified angina pectoris: Secondary | ICD-10-CM

## 2021-07-26 DIAGNOSIS — I2510 Atherosclerotic heart disease of native coronary artery without angina pectoris: Secondary | ICD-10-CM | POA: Diagnosis not present

## 2021-07-26 NOTE — Patient Instructions (Signed)
Medication Instructions:   Continue same medications  *If you need a refill on your cardiac medications before your next appointment, please call your pharmacy*   Lab Work:  None ordered   Testing/Procedures:  Schedule Lexiscan and Echo   Follow-Up: At [facility], you and your health needs are our priority.  As part of our continuing mission to provide you with exceptional heart care, we have created designated Provider Care Teams.  These Care Teams include your primary Cardiologist (physician) and Advanced Practice Providers (APPs -  Physician Assistants and Nurse Practitioners) who all work together to provide you with the care you need, when you need it.  We recommend signing up for the patient portal called "MyChart".  Sign up information is provided on this After Visit Summary.  MyChart is used to connect with patients for Virtual Visits (Telemedicine).  Patients are able to view lab/test results, encounter notes, upcoming appointments, etc.  Non-urgent messages can be sent to your provider as well.   To learn more about what you can do with MyChart, go to [email or web address].    Your next appointment:  After test   The format for your next appointment: Office   Provider:  Dr.Harding

## 2021-07-26 NOTE — Progress Notes (Signed)
Primary Care Provider: Sharilyn Sites, MD Cardiologist: Glenetta Hew, MD Electrophysiologist: None  Clinic Note: Chief Complaint  Patient presents with   Chest Pain    Several different types of chest pain   Coronary Artery Disease    Single-vessel CAD with PCI to the diagonal now having chest pain     ===================================  ASSESSMENT/PLAN   Problem List Items Addressed This Visit       Cardiology Problems   Coronary artery disease involving native coronary artery of native heart with angina pectoris ([place]) - Primary (Chronic)    She has multiple complaints some of which are chest pain somewhat sound like they may be typical for angina, but others are not at all consistent with angina.  Unfortunately very difficult to know what is what because she has these intermittent episodes all the time.  However, she does have at least 1 known PCI vessel and therefore clearly has evidence in substrate for CAD. Also, her EKG now shows left bundle branch block which is new compared to her previous EKGs.  Plan: Lexiscan Myoview for ischemic evaluation Echocardiogram to assess for any changes in EF or other structural abnormalities with left bundle branch block She is on labetalol and for many times I try to convert her to a different beta-blocker.  Now with her having significant lung issues and dyspnea I plan on follow-up will be switch her from labetalol to bisoprolol for beta-1 selectivity. She is now on Imdur 60 mg which we will continue for now. She is not on aspirin because of intolerance/allergy, and is no longer on Plavix. She is on pravastatin plus the Orion trial medication.      Relevant Orders   EKG 12-Lead (Completed)   Cardiac Stress Test: Informed Consent Details: Physician/Practitioner Attestation; Transcribe to consent form and obtain patient signature   ECHOCARDIOGRAM COMPLETE   MYOCARDIAL PERFUSION IMAGING   Essential hypertension (Chronic)    Blood  pressure actually is pretty good on her meds.   When she returns to discuss results of her studies, we plan to switch to bisoprolol 5 mg and discontinue labetalol. She is on Imdur as well, but depending on EF on echocardiogram, may want to consider ARB.       Relevant Orders   EKG 12-Lead (Completed)   ECHOCARDIOGRAM COMPLETE   MYOCARDIAL PERFUSION IMAGING   Hyperlipidemia LDL goal <70 (Chronic)    Still part of the [place] a trial.  Is on pravastatin.  She just had lipids drawn, but unfortunately I am not able to see her LDL.  Her total cholesterol looks pretty well controlled but triglycerides are still elevated.  We will hold off on making adjustments until she is out of the trial.       Relevant Orders   EKG 12-Lead (Completed)   ECHOCARDIOGRAM COMPLETE   MYOCARDIAL PERFUSION IMAGING   CAD S/P percutaneous coronary angioplasty (Chronic)    My understanding was that she was on aspirin every other day because of bruising, but now apparently is no longer taking it. Not on Plavix as well.       Relevant Orders   EKG 12-Lead (Completed)   Cardiac Stress Test: Informed Consent Details: Physician/Practitioner Attestation; Transcribe to consent form and obtain patient signature   ECHOCARDIOGRAM COMPLETE   MYOCARDIAL PERFUSION IMAGING     Other   Chest wall pain (Chronic)    She has both reproducible and nonreproducible chest pain.  To exclude ischemic chest pain/angina we will check Lexiscan Myoview.  Relevant Orders   EKG 12-Lead (Completed)   Cardiac Stress Test: Informed Consent Details: Physician/Practitioner Attestation; Transcribe to consent form and obtain patient signature   ECHOCARDIOGRAM COMPLETE   MYOCARDIAL PERFUSION IMAGING   Left bundle branch block (LBBB) determined by electrocardiography    This is a new finding.  She has had some interventricular conduction delay in the past, but no true the bundle-branch block.  She did not really have any LAD disease  in the past, however now with her having unusual symptoms we need to exclude the possibility of a new ischemic finding but also need to reassess her EF and wall motion of the left ventricle.  Plan: 2D echocardiogram and scan Myoview.       Relevant Orders   EKG 12-Lead (Completed)   Cardiac Stress Test: Informed Consent Details: Physician/Practitioner Attestation; Transcribe to consent form and obtain patient signature   ECHOCARDIOGRAM COMPLETE   MYOCARDIAL PERFUSION IMAGING    ===================================  HPI:    CORABEL LUBOW is a 75 y.o. female with a PMH below who presents today for evaluation of chest pain..  Cardiac History:  2004-2012 - Catheter-induced dissection of small Non-dominant RCA (had several Caths up to 2012 confirming stable RCA flow 08/2017 - Cath for Unstble Angina - PCI Diag HLD - [facility]ion 8 Trial HTN COPD OSA January 2021: Admitted with COVID-pneumonia-has been having issues with long-haul symptoms.  Exacerbated COPD -> now only using as needed O2 by [place]. Phocomelia multiple finger and thumb absence.  Also several stricture lines along the lower extremities.  ANNALEECE GATZKE was last seen on September 01, 2020.  She is doing okay.  Energy level still doing all right.  Not feeling "quite right in the head".  A bit groggy and foggy headed.  Going to bed early and waking up at 4 in the morning.  Dyspnea improving.  Was hoping to wean off oxygen.  Sleeps upright because of GERD.  Still has intermittent "wavelike sensations across her chest" with no rhyme or reason, and no association with any activity.  Recent Hospitalizations: None  Reviewed  CV studies:    The following studies were reviewed today: (if available, images/films reviewed: From Epic Chart or Care Everywhere) None:   Interval History:   AMIELIA RAMSON presents today "a bit out of sorts ".  She just has not been feeling right.  Something is not doing right.  She is been feeling like she  is just in a fog, and is having worsening shortness of breath.  She has these persistent episodes of these waves across her chest which have been going on for a long time.  She is also having sharp and pressure-like symptoms pressing on her chest that she says is just "hurts "as they are both rest and exertion, but she does not necessarily notice it when she is riding her stationary bike.   Just after saying that she does not get short of breath or have chest discomfort when riding a bike, she said that she gets short of breath just on routine stuff around the house.  She still has intermittent palpitations and episodes of lightheadedness and dizziness where she feels that she may pass out but not fully passing out.  She has been still having issues with her pulmonary/breathing ever since COVID.  She sometimes still uses nighttime oxygen as needed, but not routinely..  She says her mental status is not fully improved air either she still has times when she is  very confused.  She is been working word puzzles and other brain "teaser "exercises to keep her self mentally sharp.  CV Review of Symptoms (Summary) Cardiovascular ROS: positive for - chest pain, dyspnea on exertion, irregular heartbeat, palpitations, shortness of breath, and general sense of not feeling well. negative for - edema, loss of consciousness, orthopnea, rapid heart rate, or TIA/amaurosis fugax, claudication  REVIEWED OF SYSTEMS   Review of Systems  Constitutional:  Positive for malaise/fatigue.  HENT:  Negative for congestion and nosebleeds.   Respiratory:  Positive for shortness of breath.        Per HPI.  Overall her coughing wheezing type symptoms but notably improved.  Cardiovascular:  Positive for palpitations. Negative for leg swelling.  Gastrointestinal:  Positive for constipation and nausea. Negative for blood in stool, diarrhea, melena and vomiting.  Genitourinary:  Negative for hematuria.  Musculoskeletal:  Positive  for back pain and joint pain. Negative for falls.  Neurological:  Positive for dizziness and weakness (Generalized).  Psychiatric/Behavioral:  Positive for depression and memory loss. The patient is nervous/anxious. The patient does not have insomnia.        Very tearful when thinking about "getting older "   I have reviewed and (if needed) personally updated the patient's problem list, medications, allergies, past medical and surgical history, social and family history.   PAST MEDICAL HISTORY   Past Medical History:  Diagnosis Date   Anginal pain ([place])    Anxiety    Anxiety disorder    With apparent panic attacks   Arthritis    "hands, knees, back" (09/18/2017)   CAD S/P percutaneous coronary angioplasty 08/2017   Single-vessel CAD involving second diagonal branch treated with DES stent Synergy 2.25 mm x 12 mm (2.4 mm)   Chronic back pain    "all over" (09/18/2017)   COPD (chronic obstructive pulmonary disease) ([place])    PFT[facility] were done in 2008 at [facility]   Depression    Dyspnea    Dysrhythmia    LBBB   Essential hypertension    GERD (gastroesophageal reflux disease)    Hiatal hernia    History of left bundle branch block (LBBB) 08/2017   Rate Related LBBB noted in cath lab   Hyperlipidemia    Macular degeneration    right eye   Myocardial infarction [facility]) 2007   "medically induced"   Nonocclusive coronary atherosclerosis of native coronary artery 2006 through 2012   multi caths 2012, 2006, [id number] ([id number] complicated by catheter-induced dissection of small nondominant RCA, patent in 2009 with no residual abnormality); Myoview August 2013: LOW RISK, normal EF. ; Echocardiogram [name] - January 2014) moderate LVH, EF 55-65%. No significant valvular disease.   OSA (obstructive sleep apnea) 9/25/ 2012   tested 2009; tetested sleep study 07/2011--titration 09/20/2011 now use Bi-PAP   OSA on CPAP    Pneumonia    "several times in 2017; 3 times already this year" (09/18/2017)    Scoliosis    Type II diabetes mellitus ([place])     PAST SURGICAL HISTORY   Past Surgical History:  Procedure Laterality Date   CARDIAC CATHETERIZATION  2006,2007; 2009   Non-occlusice CAD - only 80% ostial SP1;  NON DOMINANNT  RCA (catheter insuced dissection with MI in 2007 --> resolved by 2009 cath)   CATARACT EXTRACTION, BILATERAL Bilateral 2017   Toric lens "in the left eye only"   COLONOSCOPY WITH PROPOFOL N/A 11/09/2018   Procedure: COLONOSCOPY WITH PROPOFOL;  Surgeon: Hildred Laser  U, MD;  Location: [facility];  Service: Endoscopy;  Laterality: N/A;  2:25   CORONARY STENT INTERVENTION N/A 09/18/2017   Procedure: CORONARY STENT INTERVENTION;  Surgeon: Leonie Man, MD;  Location: [facility] INVASIVE CV LAB: DES PCI Diag2: Synergy DES 2.25 mm x 12 mm (2.4 mm)   DIAGNOSTIC LAPAROSCOPY Right    DILATION AND CURETTAGE OF UTERUS     EYE SURGERY     INTRAVASCULAR PRESSURE WIRE/FFR STUDY N/A 09/18/2017   Procedure: INTRAVASCULAR PRESSURE WIRE/FFR STUDY;  Surgeon: Leonie Man, MD;  Location: [facility] CV LAB;  Service: Cardiovascular: FFR Diag 2 -- 0.78 (significcant) --> PCI   KNEE ARTHROSCOPY Right    LEFT HEART CATH AND CORONARY ANGIOGRAPHY N/A 09/18/2017   Procedure: LEFT HEART CATH AND CORONARY ANGIOGRAPHY;  Surgeon: Leonie Man, MD;  Location: [facility] INVASIVE CV LAB: [place] - 80% Diag2 (FFR 0.78)- PCI.  pLAD 40%, pCx 40%. Post PCI LBBB. LVEDP - ~20 mmHg. EF 55-60%   LIPOMA EXCISION Right ~ 2015   anterior abdomen   PARS PLANA VITRECTOMY W/ REPAIR OF MACULAR HOLE Right    Unsuccessful repair.  Hole filled   POLYPECTOMY  11/09/2018   Procedure: POLYPECTOMY;  Surgeon: Rogene Houston, MD;  Location: [facility];  Service: Endoscopy;;  colon   TRANSTHORACIC ECHOCARDIOGRAM  01/2020   EF 55 to 60%.  No R WMA.  GR 1 DD (normal for age).  Normal RV size and function.  Right atrial and right ventricular pressures..  Normal aortic mitral valve.-Essentially normal echo.   VAGINAL  HYSTERECTOMY     Left Heart Catheter Coronary Angiography-PCI November 18, 2017 Single vessel CAD as per CT FFR. --> Successful FFR guided PCI of the Diag2 80% w/ Synergy DES 2.25 x 12 mm               Immunization History  Administered Date(s) Administered   Fluad Quad(high Dose 65+) 10/08/2020   Influenza, High Dose Seasonal PF 10/23/2019   Influenza,inj,Quad PF,6+ Mos 08/21/2015   Influenza-Unspecified 08/21/2015, 09/25/2018    MEDICATIONS/ALLERGIES   Current Meds  Medication Sig   albuterol (PROVENTIL) (2.5 MG/3ML) 0.083% nebulizer solution Take 3 mLs (2.5 mg total) by nebulization every 6 (six) hours as needed for wheezing or shortness of breath.   albuterol (VENTOLIN HFA) 108 (90 Base) MCG/ACT inhaler Inhale 2 puffs into the lungs every 6 (six) hours as needed for wheezing or shortness of breath.   ALPRAZolam (XANAX) 0.5 MG tablet Take 0.25 mg by mouth daily.   AMBULATORY NON FORMULARY MEDICATION Inject 300 mg into the skin as directed. Medication Name:Inclisiran sodium 300 mg vs placebo   Biotin w/ Vitamins C & E (HAIR/SKIN/NAILS PO) Take 1 tablet by mouth at bedtime.   busPIRone (BUSPAR) 15 MG tablet Take 15 mg by mouth 2 (two) times daily.   Calcium Carb-Cholecalciferol (CALCIUM 600 + D PO) Take 1 tablet by mouth daily.    Coenzyme Q10 (COQ10) 100 MG CAPS Take 100 mg by mouth every morning.   CRANBERRY PO Take 1 capsule by mouth daily.    escitalopram (LEXAPRO) 20 MG tablet Take 20 mg by mouth every morning.    esomeprazole (NEXIUM) 20 MG capsule Take 20 mg by mouth daily.   fluticasone furoate-vilanterol (BREO ELLIPTA) 200-25 MCG/INH AEPB Inhale 1 puff into the lungs daily.   furosemide (LASIX) 20 MG tablet TAKE ONE TABLET EVERY OTHER DAY. (Patient taking differently: Take 20 mg by mouth every other day.)  isosorbide mononitrate (IMDUR) 60 MG 24 hr tablet TAKE ONE TABLET BY MOUTH ONCE DAILY.   labetalol (NORMODYNE) 100 MG tablet TAKE 1 TABLET BY MOUTH TWICE DAILY.    mirtazapine (REMERON) 15 MG tablet Take 7.5 mg by mouth at bedtime.    Multiple Vitamin (MULTIVITAMIN) tablet Take 1 tablet by mouth at bedtime.    nitroGLYCERIN (NITROSTAT) 0.4 MG SL tablet PLACE 1 TAB UNDER TONGUE EVERY 5 MIN IF NEEDED FOR CHEST PAIN. MAY USE 3 TIMES.NO RELIEF CALL 911. (Patient taking differently: Place 0.4 mg under the tongue every 5 (five) minutes as needed for chest pain. PLACE 1 TAB UNDER TONGUE EVERY 5 MIN IF NEEDED FOR CHEST PAIN. MAY USE 3 TIMES.NO RELIEF CALL 911.)   potassium chloride SA (KLOR-CON) 20 MEQ tablet TAKE 1 TABLET EVERY OTHER DAY, TAKE ON DAYS THAT YOU TAKE FUROSEMIDE.    Allergies  Allergen Reactions   Adhesive [Tape] Other (See Comments)    REACTION: redness/irritation at application site. **Certain bandages/adhesives cause this reaction**   Avelox [Moxifloxacin] Anaphylaxis   Bactrim [Sulfamethoxazole-Trimethoprim] Anaphylaxis, Shortness Of Breath, Rash and Other (See Comments)    REACTION: Choking, inability to swallow, redness   Levaquin [Levofloxacin] Anaphylaxis, Shortness Of Breath, Rash and Other (See Comments)    Reaction:Choking Brand name Levaquin ok per pt   Mucinex [Guaifenesin Er] Anaphylaxis, Shortness Of Breath and Rash   Penicillins Other (See Comments)    "Passed out" Has patient had a PCN reaction causing immediate rash, facial/tongue/throat swelling, SOB or lightheadedness with hypotension: Unknown Has patient had a PCN reaction causing severe rash involving mucus membranes or skin necrosis: No Has patient had a PCN reaction that required hospitalization: No Has patient had a PCN reaction occurring within the last 10 years: No If all of the above answers are "NO", then may proceed with Cephalosporin use.    Sudafed [Pseudoephedrine Hcl] Shortness Of Breath, Rash and Other (See Comments)    REACTION: Choking, redness, inability to swallow   Crestor [Rosuvastatin] Other (See Comments)    Leg pain   Lipitor [Atorvastatin] Other  (See Comments)    Leg pain   Vytorin [Ezetimibe-Simvastatin] Other (See Comments)    Leg pain   Fish Oil Nausea And Vomiting   Norvasc [Amlodipine] Other (See Comments)    unknown   Peanut-Containing Drug Products    Shrimp [Shellfish Allergy]     SOCIAL HISTORY/FAMILY HISTORY   Reviewed in Epic:  Pertinent findings:  Social History   Tobacco Use   Smoking status: Former    Packs/day: 3.00    Years: 8.50    Pack years: 25.50    Types: Cigarettes    Quit date: 04/18/1976    Years since quitting: 45.3   Smokeless tobacco: Never  Vaping Use   Vaping Use: Never used  Substance Use Topics   Alcohol use: No   Drug use: No   Social History   Social History Narrative   Married mother of 20, grandmother of 49. Her mother is 81 years old. Quit smoking 34 years ago. Does not drink alcohol.  Retired from [employer] in 2012.   Usually presents with oldest daughter.   Previously worked out at [employer] regularly walking 1/2-1 mile a day, but no longer able to do so because of the [employer] decision to no longer cover [employer] cost.      Was made DNI during her hospital stay for COVID-19 in January 2021, now that she has recovered  from Covid and doing well, she has rescinded this and is now full code.    OBJCTIVE -PE, EKG, labs   Wt Readings from Last 3 Encounters:  07/26/21 182 lb 12.8 oz (82.9 kg)  06/22/21 185 lb 8 oz (84.1 kg)  04/20/21 183 lb 9.6 oz (83.3 kg)    Physical Exam: BP 132/72 (BP Location: Left Arm)   Pulse 73   Ht 5' (1.524 m)   Wt 182 lb 12.8 oz (82.9 kg)   SpO2 93%   BMI 35.70 kg/m  Physical Exam Vitals reviewed.  Constitutional:      General: She is not in acute distress.    Appearance: Normal appearance. She is obese. She is not ill-appearing.  HENT:     Head: Normocephalic and atraumatic.  Neck:     Vascular: No carotid bruit or JVD.  Cardiovascular:     Rate and Rhythm: Normal rate and regular rhythm.      Pulses: Normal pulses.     Heart sounds: Murmur (1/6 SEM at RUSB) heard.    No friction rub. No gallop.  Pulmonary:     Effort: Pulmonary effort is normal. No respiratory distress.     Breath sounds: Normal breath sounds. No wheezing, rhonchi or rales.  Chest:     Chest wall: Tenderness present.  Musculoskeletal:        General: Deformity (Multiple deformities from phocomelia noted) present.     Cervical back: Normal range of motion and neck supple.  Skin:    General: Skin is warm and dry.     Coloration: Skin is not pale.  Neurological:     General: No focal deficit present.     Mental Status: She is alert and oriented to person, place, and time.  Psychiatric:        Behavior: Behavior normal.     Comments: Tearful, sad, easily upset    1/6 harsh SEM at RUSB. Phocomelia findings.   Adult ECG Report  Rate: 73 ;  Rhythm: normal sinus rhythm and LBBB-new.  Otherwise normal axis, intervals and durations. ;   Narrative Interpretation: Left bundle branch block is new  Recent Labs:    06/21/2021: TC 155.  TG 228, HDL 49.  LDL not reported; A1c 6.0.  Hgb 15.8.  K+ 5.0. Lab Results  Component Value Date   CREATININE 0.71 01/03/2020   BUN 21 01/01/2020   NA 139 01/01/2020   K 5.0 01/01/2020   CL 102 01/01/2020   CO2 28 01/01/2020   CBC Latest Ref Rng & Units 01/01/2020 12/31/2019 12/30/2019  WBC 4.0 - 10.5 K/uL 7.7 4.8 4.4  Hemoglobin 12.0 - 15.0 g/dL 15.1(H) 14.9 15.3(H)  Hematocrit 36.0 - 46.0 % 45.4 45.7 47.0(H)  Platelets 150 - 400 K/uL 292 247 216    Lab Results  Component Value Date   TSH 1.721 08/20/2015    ==================================================  COVID-19 Education: The signs and symptoms of COVID-19 were discussed with the patient and how to seek care for testing (follow up with PCP or arrange E-visit).    I spent a total of 23mnutes with the patient spent in direct patient consultation.  Additional time spent with chart review  / charting (studies,  outside notes, etc): 18 min Total Time: 42 min  Current medicines are reviewed at length with the patient today.  (+/- concerns) none  This visit occurred during the SARS-CoV-2 public health emergency.  Safety protocols were in place, including screening questions prior to the visit, additional  usage of staff PPE, and extensive cleaning of exam room while observing appropriate contact time as indicated for disinfecting solutions.  Notice: This dictation was prepared with Dragon dictation along with smaller phrase technology. Any transcriptional errors that result from this process are unintentional and may not be corrected upon review.  Patient Instructions / Medication Changes & Studies & Tests Ordered   Shared Decision Making/Informed Consent{ The risks [chest pain, shortness of breath, cardiac arrhythmias, dizziness, blood pressure fluctuations, myocardial infarction, stroke/transient ischemic attack, nausea, vomiting, allergic reaction, radiation exposure, metallic taste sensation and life-threatening complications (estimated to be 1 in 10,000)], benefits (risk stratification, diagnosing coronary artery disease, treatment guidance) and alternatives of a nuclear stress test were discussed in detail with Ms. Whisenant and she agrees to proceed.    Patient Instructions  Medication Instructions:   Continue same medications  *If you need a refill on your cardiac medications before your next appointment, please call your pharmacy*   Lab Work:  None ordered   Testing/Procedures:  Schedule Lexiscan and Echo   Follow-Up: At [facility], you and your health needs are our priority.  As part of our continuing mission to provide you with exceptional heart care, we have created designated Provider Care Teams.  These Care Teams include your primary Cardiologist (physician) and Advanced Practice Providers (APPs -  Physician Assistants and Nurse Practitioners) who all work together to provide  you with the care you need, when you need it.  We recommend signing up for the patient portal called "MyChart".  Sign up information is provided on this After Visit Summary.  MyChart is used to connect with patients for Virtual Visits (Telemedicine).  Patients are able to view lab/test results, encounter notes, upcoming appointments, etc.  Non-urgent messages can be sent to your provider as well.   To learn more about what you can do with MyChart, go to [email or web address].    Your next appointment:  After test   The format for your next appointment: Office   Provider:  Dr.Cherrelle Plante     Studies Ordered:   Orders Placed This Encounter  Procedures   Cardiac Stress Test: Informed Consent Details: Physician/Practitioner Attestation; Transcribe to consent form and obtain patient signature   MYOCARDIAL PERFUSION IMAGING   EKG 12-Lead   ECHOCARDIOGRAM COMPLETE     Glenetta Hew, M.D., M.S. Interventional Cardiologist   Pager # [phone] Phone # [phone] [address] [place], [place] [address]   Thank you for choosing Heartcare at [facility]!!

## 2021-07-27 ENCOUNTER — Encounter: Payer: Self-pay | Admitting: Cardiology

## 2021-07-27 NOTE — Assessment & Plan Note (Signed)
She has both reproducible and nonreproducible chest pain.  To exclude ischemic chest pain/angina we will check Lexiscan Myoview.

## 2021-07-27 NOTE — Assessment & Plan Note (Signed)
Blood pressure actually is pretty good on her meds.   When she returns to discuss results of her studies, we plan to switch to bisoprolol 5 mg and discontinue labetalol. She is on Imdur as well, but depending on EF on echocardiogram, may want to consider ARB.

## 2021-07-27 NOTE — Assessment & Plan Note (Signed)
Still part of the [place] a trial.  Is on pravastatin.  She just had lipids drawn, but unfortunately I am not able to see her LDL.  Her total cholesterol looks pretty well controlled but triglycerides are still elevated.  We will hold off on making adjustments until she is out of the trial.

## 2021-07-27 NOTE — Assessment & Plan Note (Signed)
My understanding was that she was on aspirin every other day because of bruising, but now apparently is no longer taking it. Not on Plavix as well.

## 2021-07-27 NOTE — Assessment & Plan Note (Signed)
She has multiple complaints some of which are chest pain somewhat sound like they may be typical for angina, but others are not at all consistent with angina.  Unfortunately very difficult to know what is what because she has these intermittent episodes all the time.  However, she does have at least 1 known PCI vessel and therefore clearly has evidence in substrate for CAD. Also, her EKG now shows left bundle branch block which is new compared to her previous EKGs.  Plan:  Lexiscan Myoview for ischemic evaluation  Echocardiogram to assess for any changes in EF or other structural abnormalities with left bundle branch block  She is on labetalol and for many times I try to convert her to a different beta-blocker.  Now with her having significant lung issues and dyspnea I plan on follow-up will be switch her from labetalol to bisoprolol for beta-1 selectivity.  She is now on Imdur 60 mg which we will continue for now.  She is not on aspirin because of intolerance/allergy, and is no longer on Plavix.  She is on pravastatin plus the Orion trial medication.

## 2021-07-27 NOTE — Assessment & Plan Note (Signed)
This is a new finding.  She has had some interventricular conduction delay in the past, but no true the bundle-branch block.  She did not really have any LAD disease in the past, however now with her having unusual symptoms we need to exclude the possibility of a new ischemic finding but also need to reassess her EF and wall motion of the left ventricle.  Plan: 2D echocardiogram and scan Myoview.

## 2021-07-28 DIAGNOSIS — G4733 Obstructive sleep apnea (adult) (pediatric): Secondary | ICD-10-CM | POA: Diagnosis not present

## 2021-07-29 ENCOUNTER — Telehealth (HOSPITAL_COMMUNITY): Payer: Self-pay | Admitting: *Deleted

## 2021-07-29 NOTE — Telephone Encounter (Signed)
Close encounter 

## 2021-08-03 DIAGNOSIS — U071 COVID-19: Secondary | ICD-10-CM | POA: Diagnosis not present

## 2021-08-03 DIAGNOSIS — H53483 Generalized contraction of visual field, bilateral: Secondary | ICD-10-CM | POA: Diagnosis not present

## 2021-08-04 ENCOUNTER — Other Ambulatory Visit: Payer: Self-pay

## 2021-08-04 ENCOUNTER — Ambulatory Visit (HOSPITAL_COMMUNITY)
Admission: RE | Admit: 2021-08-04 | Discharge: 2021-08-04 | Disposition: A | Discharge status: Home / Self Care | Payer: Medicare Other | Source: Ambulatory Visit | Attending: Cardiology | Admitting: Cardiology

## 2021-08-04 DIAGNOSIS — I25119 Atherosclerotic heart disease of native coronary artery with unspecified angina pectoris: Secondary | ICD-10-CM | POA: Insufficient documentation

## 2021-08-04 DIAGNOSIS — I447 Left bundle-branch block, unspecified: Secondary | ICD-10-CM

## 2021-08-04 DIAGNOSIS — I2510 Atherosclerotic heart disease of native coronary artery without angina pectoris: Secondary | ICD-10-CM | POA: Diagnosis not present

## 2021-08-04 DIAGNOSIS — R0789 Other chest pain: Secondary | ICD-10-CM | POA: Diagnosis not present

## 2021-08-04 DIAGNOSIS — I10 Essential (primary) hypertension: Secondary | ICD-10-CM | POA: Insufficient documentation

## 2021-08-04 DIAGNOSIS — E785 Hyperlipidemia, unspecified: Secondary | ICD-10-CM | POA: Diagnosis not present

## 2021-08-04 DIAGNOSIS — Z9861 Coronary angioplasty status: Secondary | ICD-10-CM | POA: Insufficient documentation

## 2021-08-04 HISTORY — PX: TRANSTHORACIC ECHOCARDIOGRAM: SHX275

## 2021-08-04 HISTORY — PX: NM MYOVIEW LTD: HXRAD82

## 2021-08-04 LAB — MYOCARDIAL PERFUSION IMAGING
LV dias vol: 97 mL (ref 46–106)
LV sys vol: 41 mL
Peak HR: 73 {beats}/min
Rest HR: 50 {beats}/min
SDS: 2
SRS: 4
SSS: 6
TID: 1.07

## 2021-08-04 MED ORDER — TECHNETIUM TC 99M TETROFOSMIN IV KIT
10.8000 | PACK | Freq: Once | INTRAVENOUS | Status: AC | PRN
  Administered 2021-08-04: 10.8 via INTRAVENOUS
  Filled 2021-08-04: qty 11

## 2021-08-04 MED ORDER — TECHNETIUM TC 99M TETROFOSMIN IV KIT
29.6000 | PACK | Freq: Once | INTRAVENOUS | Status: AC | PRN
  Administered 2021-08-04: 29.6 via INTRAVENOUS
  Filled 2021-08-04: qty 30

## 2021-08-04 MED ORDER — REGADENOSON 0.4 MG/5ML IV SOLN
0.4000 mg | Freq: Once | INTRAVENOUS | Status: AC
  Administered 2021-08-04: 0.4 mg via INTRAVENOUS

## 2021-08-05 ENCOUNTER — Ambulatory Visit
Admission: RE | Admit: 2021-08-05 | Discharge: 2021-08-05 | Disposition: A | Discharge status: Home / Self Care | Payer: Medicare Other | Source: Ambulatory Visit | Attending: Family Medicine | Admitting: Family Medicine

## 2021-08-05 DIAGNOSIS — Z1231 Encounter for screening mammogram for malignant neoplasm of breast: Secondary | ICD-10-CM | POA: Diagnosis not present

## 2021-08-12 ENCOUNTER — Ambulatory Visit (HOSPITAL_COMMUNITY): Payer: Medicare Other | Attending: Cardiology

## 2021-08-12 ENCOUNTER — Other Ambulatory Visit: Payer: Self-pay

## 2021-08-12 DIAGNOSIS — I10 Essential (primary) hypertension: Secondary | ICD-10-CM

## 2021-08-12 DIAGNOSIS — E785 Hyperlipidemia, unspecified: Secondary | ICD-10-CM

## 2021-08-12 DIAGNOSIS — R0789 Other chest pain: Secondary | ICD-10-CM

## 2021-08-12 DIAGNOSIS — I25119 Atherosclerotic heart disease of native coronary artery with unspecified angina pectoris: Secondary | ICD-10-CM | POA: Diagnosis not present

## 2021-08-12 DIAGNOSIS — I2510 Atherosclerotic heart disease of native coronary artery without angina pectoris: Secondary | ICD-10-CM | POA: Diagnosis not present

## 2021-08-12 DIAGNOSIS — I447 Left bundle-branch block, unspecified: Secondary | ICD-10-CM | POA: Diagnosis not present

## 2021-08-12 DIAGNOSIS — Z9861 Coronary angioplasty status: Secondary | ICD-10-CM

## 2021-08-12 LAB — ECHOCARDIOGRAM COMPLETE
Area-P 1/2: 3.03 cm2
S' Lateral: 2.9 cm

## 2021-08-12 MED ORDER — PERFLUTREN LIPID MICROSPHERE
1.0000 mL | INTRAVENOUS | Status: AC | PRN
  Administered 2021-08-12: 2 mL via INTRAVENOUS

## 2021-08-13 DIAGNOSIS — R7309 Other abnormal glucose: Secondary | ICD-10-CM | POA: Diagnosis not present

## 2021-08-24 ENCOUNTER — Other Ambulatory Visit: Payer: Self-pay | Admitting: Cardiology

## 2021-08-28 DIAGNOSIS — G4733 Obstructive sleep apnea (adult) (pediatric): Secondary | ICD-10-CM | POA: Diagnosis not present

## 2021-09-03 DIAGNOSIS — U071 COVID-19: Secondary | ICD-10-CM | POA: Diagnosis not present

## 2021-09-06 DIAGNOSIS — E1151 Type 2 diabetes mellitus with diabetic peripheral angiopathy without gangrene: Secondary | ICD-10-CM | POA: Diagnosis not present

## 2021-09-06 DIAGNOSIS — E1140 Type 2 diabetes mellitus with diabetic neuropathy, unspecified: Secondary | ICD-10-CM | POA: Diagnosis not present

## 2021-09-21 ENCOUNTER — Other Ambulatory Visit: Payer: Self-pay

## 2021-09-21 ENCOUNTER — Ambulatory Visit: Payer: Medicare Other | Admitting: Cardiology

## 2021-09-21 ENCOUNTER — Encounter: Payer: Self-pay | Admitting: Cardiology

## 2021-09-21 VITALS — BP 118/70 | HR 63 | Ht 60.0 in | Wt 183.6 lb

## 2021-09-21 DIAGNOSIS — I25119 Atherosclerotic heart disease of native coronary artery with unspecified angina pectoris: Secondary | ICD-10-CM | POA: Diagnosis not present

## 2021-09-21 DIAGNOSIS — I2510 Atherosclerotic heart disease of native coronary artery without angina pectoris: Secondary | ICD-10-CM | POA: Diagnosis not present

## 2021-09-21 DIAGNOSIS — I10 Essential (primary) hypertension: Secondary | ICD-10-CM

## 2021-09-21 DIAGNOSIS — E669 Obesity, unspecified: Secondary | ICD-10-CM

## 2021-09-21 DIAGNOSIS — R0609 Other forms of dyspnea: Secondary | ICD-10-CM

## 2021-09-21 DIAGNOSIS — R0600 Dyspnea, unspecified: Secondary | ICD-10-CM

## 2021-09-21 DIAGNOSIS — I447 Left bundle-branch block, unspecified: Secondary | ICD-10-CM

## 2021-09-21 DIAGNOSIS — E785 Hyperlipidemia, unspecified: Secondary | ICD-10-CM

## 2021-09-21 DIAGNOSIS — Z9861 Coronary angioplasty status: Secondary | ICD-10-CM

## 2021-09-21 MED ORDER — BISOPROLOL FUMARATE 5 MG PO TABS: 5.0000 mg | ORAL_TABLET | Freq: Every evening | ORAL | 3 refills | Status: DC

## 2021-09-21 MED ORDER — PRAVASTATIN SODIUM 80 MG PO TABS: 80.0000 mg | ORAL_TABLET | Freq: Every evening | ORAL | 3 refills | Status: DC

## 2021-09-21 NOTE — Patient Instructions (Addendum)
Medication Instructions:  Stop taking Labetalol the morning  ( you can finish the bottle if you like)  and    Then start taking Bisoprolol 5 mg that evening    Restart Pravastatin 80 mg at bedtime.  *If you need a refill on your cardiac medications before your next appointment, please call your pharmacy*   Lab Work: Not needed    Testing/Procedures  Not needed  Follow-Up: At [facility], you and your health needs are our priority.  As part of our continuing mission to provide you with exceptional heart care, we have created designated Provider Care Teams.  These Care Teams include your primary Cardiologist (physician) and Advanced Practice Providers (APPs -  Physician Assistants and Nurse Practitioners) who all work together to provide you with the care you need, when you need it.     Your next appointment:   6 to 7 month(s) march or April 2023  The format for your next appointment:   In Person  Provider:   Glenetta Hew, MD   Other Instructions

## 2021-09-21 NOTE — Progress Notes (Signed)
Primary Care Provider: Sharilyn Sites, MD Cardiologist: Glenetta Hew, MD Electrophysiologist: None Pulmonologist: Dr. Chesley Mires  Clinic Note: Chief Complaint  Patient presents with   Follow-up    Test results-Myoview and echocardiogram.  Reviewed.   Coronary Artery Disease    Single-vessel CAD involving diagonal branch.  Status post PCI.  Negative Myoview.   ===================================  ASSESSMENT/PLAN   Problem List Items Addressed This Visit       Cardiology Problems   Coronary artery disease involving native coronary artery of native heart with angina pectoris ([name]) - Primary (Chronic)    She has lots of different types of chest pain.  1 time we found the diagonal vessel that had a disease and was treated with a stent.  Otherwise no significant disease.  Nonischemic Myoview and relative normal echocardiogram.  Nothing to suggest anterior ischemia with /s/ block.  Plan: Convert from nadolol to bisoprolol Continue statin with hopes to start back on inclisiran since [place] trial has not completed.. Continue Imdur We need to figure out if she is needing to be on Plavix or aspirin.  Apparently has aspirin intolerance.  Would prefer Plavix.      Relevant Medications   pravastatin (PRAVACHOL) 80 MG tablet   bisoprolol (ZEBETA) 5 MG tablet   Essential hypertension (Chronic)    Blood pressure looks pretty well controlled on current meds.   I have been try to change her beta-blocker for a long time.  Plan: Stop labetalol, convert to bisoprolol 5 mg on the evening of her last dose of labetalol. Continue Imdur and Lasix. With normal EF and diastolic function, can hold off on ARB as long as pressures stay stable.      Relevant Medications   pravastatin (PRAVACHOL) 80 MG tablet   bisoprolol (ZEBETA) 5 MG tablet   Hyperlipidemia LDL goal <70 (Chronic)    Remains on pravastatin restart at 80 mg daily.. Trial is now complete.   Will need to determine if she qualifies  for inclisiran having completed the trial.      Relevant Medications   pravastatin (PRAVACHOL) 80 MG tablet   bisoprolol (ZEBETA) 5 MG tablet   CAD S/P percutaneous coronary angioplasty (Chronic)    Need to have her restart taking aspirin 81 mg.      Relevant Medications   pravastatin (PRAVACHOL) 80 MG tablet   bisoprolol (ZEBETA) 5 MG tablet     Other   DOE (dyspnea on exertion)    I suspect that this is related to deconditioning and her lungs.  Does not seem to be related to cardiac issue.      Left bundle branch block (LBBB) determined by electrocardiography    Relative normal electrocardiogram exception of showing bundle branch block wall motion, nonischemic Myoview.  At  I suspect this is progression of conduction disease.  She did have some borderline IVCD in the past..      Obesity (BMI 30-39.9) (Chronic)    Continue to encourage increased level of activity and exercise.  Dietary adjustments.  The patient understands the need to lose weight with diet and exercise. We have discussed specific strategies for this.        ===================================  HPI:    Vickie [name] is a 75 y.o. female with a PMH below who presents today for close follow-up to discuss results of her echocardiogram and Myoview.  Cardiac History:  2004-2012 - Catheter-induced dissection of small Non-dominant RCA  (had several Caths up to 2012 confirming stable RCA flow  08/2017 - Cath for Unstble Angina - PCI Diag HLD - Orion 8 Trial- now completed  HTN - controlled (remains on Labetalol despite attempts to change to other BB). COPD / OSA January 2021: Admitted with COVID-pneumonia, ? [place] Sx. (Exacerbated by COPD).  Phocomelia -multiple fingers, thumb absence  LE stricture lines   Vickie [name] was last seen on July 26, 2021 --> she said that she was "out of sorts.  Just not feeling right, in a fog.  Worsening dyspnea.  Persistent episodes of waves across her chest that come  and go.  Also sharp and pressure-like symptoms in her chest..  She just states that it "hurts " -> occur both rest or exertion.  But not noted when riding her stationary bike.  However, she indicated that she gets short of breath just walking around the house.  Intermittent palpitations and episodic lightheadedness/dizziness with near syncope, but no syncope.  -> She talked about long-haul COVID symptoms that she discussed with her pulmonologist.  Also noted that she is still somewhat confused.  Trying to work on word puzzles etc. to make her self sharp. EKG showed LBBB.  Indicating progression from IVCD. --> Myoview, Echo ordered.  Recent Hospitalizations: None  Reviewed  CV studies:    The following studies were reviewed today: (if available, images/films reviewed: From Epic Chart or Care Everywhere) [place] 08/04/2021: LOW RISK. EF 55-60%. Mild fixed apical defect - /w breast attenuation.   ECHO 08/12/2021: Normal LVEF 55-60%.  Septal movement consistent with LBBB now present.  Unable to assess diastolic function.  Normal RV size and function.  Unable assess PAP.  Normal aortic and mitral valves.  Normal RAP.   Interval History:   Vickie [name] returns for follow-up overall stating that she is gradually getting a little stronger.  She still has the dyspnea episodes and the wheezing.  Is due to see her pulmonologist soon.  She says that she still has some intermittent chest discomfort, but less frequently.  Seems to be more positional.  Palpitations seem to be pretty well controlled.  She is hoping to try to get more active now that she is seeing the echocardiogram and Myoview results.  CV Review of Symptoms (Summary) Cardiovascular ROS: positive for - chest pain, dyspnea on exertion, palpitations, and along with lightheadedness and dizziness, but generally all symptoms improved. negative for - edema, orthopnea, paroxysmal nocturnal dyspnea, rapid heart rate, shortness of breath, or  lightheadedness, dizziness, wooziness or syncope/near syncope, TIA/amaurosis fugax, claudication  REVIEWED OF SYSTEMS   Review of Systems  Constitutional:  Positive for malaise/fatigue (Energy level gradually improving, but still down.). Negative for weight loss.  HENT:  Positive for congestion. Negative for nosebleeds.   Respiratory:  Positive for cough, shortness of breath (Still present, but better.) and wheezing (Mild).   Cardiovascular:        Per HPI  Gastrointestinal:  Negative for blood in stool and melena.  Genitourinary:  Negative for dysuria and hematuria.  Musculoskeletal:  Positive for myalgias. Negative for falls and joint pain.  Neurological:  Positive for dizziness. Negative for focal weakness, seizures, loss of consciousness, weakness and headaches.  Psychiatric/Behavioral:  Negative for depression (Seems to be better spirits) and memory loss. The patient is nervous/anxious and has insomnia.    I have reviewed and (if needed) personally updated the patient's problem list, medications, allergies, past medical and surgical history, social and family history.   PAST MEDICAL HISTORY   Past Medical History:  Diagnosis  Date   Anginal pain ([place])    Anxiety    Anxiety disorder    With apparent panic attacks   Arthritis    "hands, knees, back" (09/18/2017)   CAD S/P percutaneous coronary angioplasty 08/2017   Single-vessel CAD involving second diagonal branch treated with DES stent Synergy 2.25 mm x 12 mm (2.4 mm)   Chronic back pain    "all over" (09/18/2017)   COPD (chronic obstructive pulmonary disease) ([place])    PFTs were done in [facility] at [facility]   Depression    Dyspnea    Dysrhythmia    LBBB   Essential hypertension    GERD (gastroesophageal reflux disease)    Hiatal hernia    History of left bundle branch block (LBBB) 08/2017   Rate Related LBBB noted in cath lab   Hyperlipidemia    Macular degeneration    right eye   Myocardial infarction [facility]) 2007    "medically induced"   Nonocclusive coronary atherosclerosis of native coronary artery 2006 through 2012   multi caths 2012, 2006, 8921 1941 (7408 complicated by catheter-induced dissection of small nondominant RCA, patent in 2009 with no residual abnormality); Myoview August 2013: LOW RISK, normal EF. ; Echocardiogram [name] - January 2014) moderate LVH, EF 55-65%. No significant valvular disease.   OSA (obstructive sleep apnea) 9/25/ 2012   tested 2009; tetested sleep study 07/2011--titration 09/20/2011 now use Bi-PAP   OSA on CPAP    Pneumonia    "several times in 2017; 3 times already this year" (09/18/2017)   Scoliosis    Type II diabetes mellitus ([place])     PAST SURGICAL HISTORY   Past Surgical History:  Procedure Laterality Date   CARDIAC CATHETERIZATION  2006,2007; 2009   Non-occlusice CAD - only 80% ostial SP1;  NON DOMINANNT  RCA (catheter insuced dissection with MI in 2007 --> resolved by 2009 cath)   CATARACT EXTRACTION, BILATERAL Bilateral 2017   Toric lens "in the left eye only"   COLONOSCOPY WITH PROPOFOL N/A 11/09/2018   Procedure: COLONOSCOPY WITH PROPOFOL;  Surgeon: Rogene [place], MD;  Location: [facility];  Service: Endoscopy;  Laterality: N/A;  2:25   CORONARY STENT INTERVENTION N/A 09/18/2017   Procedure: CORONARY STENT INTERVENTION;  Surgeon: Leonie Man, MD;  [facility]: [facility] INVASIVE CV LAB: DES PCI Diag2: Synergy DES 2.25 mm x 12 mm (2.4 mm)   DIAGNOSTIC LAPAROSCOPY Right    DILATION AND CURETTAGE OF UTERUS     EYE SURGERY     INTRAVASCULAR PRESSURE WIRE/FFR STUDY N/A 09/18/2017   Procedure: INTRAVASCULAR PRESSURE WIRE/FFR STUDY;  Surgeon: Leonie Man, MD;  Location: [facility] CV LAB;  Service: Cardiovascular: FFR Diag 2 -- 0.78 (significcant) --> PCI   KNEE ARTHROSCOPY Right    LEFT HEART CATH AND CORONARY ANGIOGRAPHY N/A 09/18/2017   Procedure: LEFT HEART CATH AND CORONARY ANGIOGRAPHY;  Surgeon: Leonie Man, MD;  Location: [facility] INVASIVE CV  LAB: [place] - 80% Diag2 (FFR 0.78)- PCI.  pLAD 40%, pCx 40%. Post PCI LBBB. LVEDP - ~20 mmHg. EF 55-60%   LIPOMA EXCISION Right ~ 2015   anterior abdomen   NM MYOVIEW LTD  08/04/2021   LOW RISK. EF 55-60%. Mild fixed apical defect - /w breast attenuation.   PARS PLANA VITRECTOMY W/ REPAIR OF MACULAR HOLE Right    Unsuccessful repair.  Hole filled   POLYPECTOMY  11/09/2018   Procedure: POLYPECTOMY;  Surgeon: Rogene [place], MD;  Location: [facility];  Service: Endoscopy;;  colon   TRANSTHORACIC ECHOCARDIOGRAM  08/04/2021   Normal LVEF 55-60%.  Septal movement consistent with LBBB now present.  Unable to assess diastolic function.  Normal RV size and function.  Unable assess PAP.  Normal aortic and mitral valves.  Normal RAP.   VAGINAL HYSTERECTOMY     Left Heart Catheter Coronary Angiography-PCI November 18, 2017 Single vessel CAD as per CT FFR. --> Successful FFR guided PCI of the Diag2 80% w/ Synergy DES 2.25 x 12 mm               Immunization History  Administered Date(s) Administered   Fluad Quad(high Dose 65+) 10/08/2020, 10/01/2021   Influenza, High Dose Seasonal PF 10/23/2019   Influenza,inj,Quad PF,6+ Mos 08/21/2015   Influenza-Unspecified 08/21/2015, 09/25/2018    MEDICATIONS/ALLERGIES   Current Meds  Medication Sig   albuterol (PROVENTIL) (2.5 MG/3ML) 0.083% nebulizer solution Take 3 mLs (2.5 mg total) by nebulization every 6 (six) hours as needed for wheezing or shortness of breath.   albuterol (VENTOLIN HFA) 108 (90 Base) MCG/ACT inhaler Inhale 2 puffs into the lungs every 6 (six) hours as needed for wheezing or shortness of breath.   ALPRAZolam (XANAX) 0.5 MG tablet Take 0.25 mg by mouth 2 (two) times daily.   AMBULATORY NON FORMULARY MEDICATION Inject 300 mg into the skin as directed. Medication Name:Inclisiran sodium 300 mg vs placebo   Biotin w/ Vitamins C & E (HAIR/SKIN/NAILS PO) Take 1 tablet by mouth at bedtime.   busPIRone (BUSPAR) 15 MG tablet Take 15 mg  by mouth 2 (two) times daily.   Calcium Carb-Cholecalciferol (CALCIUM 600 + D PO) Take 1 tablet by mouth daily.    Coenzyme Q10 (COQ10) 100 MG CAPS Take 100 mg by mouth every morning.   CRANBERRY PO Take 1 capsule by mouth daily.    escitalopram (LEXAPRO) 20 MG tablet Take 20 mg by mouth every morning.    esomeprazole (NEXIUM) 20 MG capsule Take 20 mg by mouth daily.   fluticasone furoate-vilanterol (BREO ELLIPTA) 200-25 MCG/INH AEPB Inhale 1 puff into the lungs daily.   furosemide (LASIX) 20 MG tablet TAKE ONE TABLET EVERY OTHER DAY. (Patient taking differently: Take 20 mg by mouth every other day.)   isosorbide mononitrate (IMDUR) 60 MG 24 hr tablet TAKE ONE TABLET BY MOUTH ONCE DAILY.   labetalol (NORMODYNE) 100 MG tablet TAKE 1 TABLET BY MOUTH TWICE DAILY.   mirtazapine (REMERON) 15 MG tablet Take 7.5 mg by mouth at bedtime.    Multiple Vitamin (MULTIVITAMIN) tablet Take 1 tablet by mouth at bedtime.    nitroGLYCERIN (NITROSTAT) 0.4 MG SL tablet PLACE 1 TAB UNDER TONGUE EVERY 5 MIN IF NEEDED FOR CHEST PAIN. MAY USE 3 TIMES.NO RELIEF CALL 911. (Patient taking differently: Place 0.4 mg under the tongue every 5 (five) minutes as needed for chest pain. PLACE 1 TAB UNDER TONGUE EVERY 5 MIN IF NEEDED FOR CHEST PAIN. MAY USE 3 TIMES.NO RELIEF CALL 911.)   potassium chloride SA (KLOR-CON) 20 MEQ tablet TAKE 1 TABLET EVERY OTHER DAY, TAKE ON DAYS THAT YOU TAKE FUROSEMIDE.   [DISCONTINUED] fluticasone furoate-vilanterol (BREO ELLIPTA) 200-25 MCG/INH AEPB Inhale 1 puff into the lungs daily.    Allergies  Allergen Reactions   Adhesive [Tape] Other (See Comments)    REACTION: redness/irritation at application site. **Certain bandages/adhesives cause this reaction**   Avelox [Moxifloxacin] Anaphylaxis   Bactrim [Sulfamethoxazole-Trimethoprim] Anaphylaxis, Shortness Of Breath, Rash and Other (See Comments)    REACTION: Choking, inability to  swallow, redness   Levaquin [Levofloxacin] Anaphylaxis,  Shortness Of Breath, Rash and Other (See Comments)    Reaction:Choking Brand name Levaquin ok per pt   Mucinex [Guaifenesin Er] Anaphylaxis, Shortness Of Breath and Rash   Penicillins Other (See Comments)    "Passed out" Has patient had a PCN reaction causing immediate rash, facial/tongue/throat swelling, SOB or lightheadedness with hypotension: Unknown Has patient had a PCN reaction causing severe rash involving mucus membranes or skin necrosis: No Has patient had a PCN reaction that required hospitalization: No Has patient had a PCN reaction occurring within the last 10 years: No If all of the above answers are "NO", then may proceed with Cephalosporin use.    Sudafed [Pseudoephedrine Hcl] Shortness Of Breath, Rash and Other (See Comments)    REACTION: Choking, redness, inability to swallow   Crestor [Rosuvastatin] Other (See Comments)    Leg pain   Lipitor [Atorvastatin] Other (See Comments)    Leg pain   Vytorin [Ezetimibe-Simvastatin] Other (See Comments)    Leg pain   Fish Oil Nausea And Vomiting   Norvasc [Amlodipine] Other (See Comments)    unknown   Peanut-Containing Drug Products    Shrimp [Shellfish Allergy]     SOCIAL HISTORY/FAMILY HISTORY   Reviewed in Epic:  Pertinent findings:  Social History   Tobacco Use   Smoking status: Former    Packs/day: 3.00    Years: 8.50    Pack years: 25.50    Types: Cigarettes    Quit date: 04/18/1976    Years since quitting: 45.5   Smokeless tobacco: Never  Vaping Use   Vaping Use: Never used  Substance Use Topics   Alcohol use: No   Drug use: No   Social History   Social History Narrative   Married mother of 2, grandmother of 55. Her mother is 70 years old. Quit smoking 34 years ago. Does not drink alcohol.  Retired from [employer] in 2012.   Usually presents with oldest daughter.   Previously worked out at [employer] regularly walking 1/2-1 mile a day, but no longer able to do so because of the [employer] decision to no longer cover [employer] cost.      Was made DNI during her hospital stay for COVID-19 in January 2021, now that she has recovered from [place] and doing well, she has rescinded this and is now full code.    OBJCTIVE -PE, EKG, labs   Wt Readings from Last 3 Encounters:  10/05/21 178 lb (80.7 kg)  10/01/21 178 lb 0.6 oz (80.8 kg)  09/21/21 183 lb 9.6 oz (83.3 kg)    Physical Exam: BP 118/70 (BP Location: Left Arm)   Pulse 63   Ht 5' (1.524 m)   Wt 183 lb 9.6 oz (83.3 kg)   SpO2 93%   BMI 35.86 kg/m  Physical Exam Vitals reviewed.  Constitutional:      General: She is not in acute distress.    Appearance: Normal appearance. She is obese. She is not ill-appearing.  HENT:     Head: Normocephalic and atraumatic.  Cardiovascular:     Pulses: Normal pulses.     Heart sounds: Murmur (1/6 SEM at RUSB.) heard.    No friction rub. No gallop.  Pulmonary:     Effort: Pulmonary effort is normal. No respiratory distress.     Breath sounds: Normal breath sounds. No stridor.  Chest:     Chest wall: Tenderness present.  Musculoskeletal:  General: No swelling. Normal range of motion.     Comments: Phocomelia deformities  Skin:    General: Skin is warm and dry.  Neurological:     General: No focal deficit present.     Mental Status: She is alert and oriented to person, place, and time.     Gait: Gait normal.  Psychiatric:        Mood and Affect: Mood normal.        Behavior: Behavior normal.        Thought Content: Thought content normal.        Judgment: Judgment normal.     Comments: Much better mood.     Adult ECG Report N/a  Recent Labs: Still having labs drawn by the [place] trial. 06/16/2021: TC 155, TG 228, HDL 49, LDL 69.  A1c 6.0, Hgb 15.8, Cr 0.81, K+ 5.0.  TSH 1.27. Lab Results  Component Value Date   CHOL 217 (H) 02/10/2017   HDL 53 02/10/2017   LDLCALC 121 (H) 02/10/2017   TRIG 94 12/27/2019   CHOLHDL 4.1 02/10/2017   Lab  Results  Component Value Date   CREATININE 0.71 01/03/2020   BUN 21 01/01/2020   NA 139 01/01/2020   K 5.0 01/01/2020   CL 102 01/01/2020   CO2 28 01/01/2020   CBC Latest Ref Rng & Units 01/01/2020 12/31/2019 12/30/2019  WBC 4.0 - 10.5 K/uL 7.7 4.8 4.4  Hemoglobin 12.0 - 15.0 g/dL 15.1(H) 14.9 15.3(H)  Hematocrit 36.0 - 46.0 % 45.4 45.7 47.0(H)  Platelets 150 - 400 K/uL 292 247 216    Lab Results  Component Value Date   HGBA1C 6.4 (H) 12/29/2019   Lab Results  Component Value Date   TSH 1.721 08/20/2015    ==================================================  COVID-19 Education: The signs and symptoms of COVID-19 were discussed with the patient and how to seek care for testing (follow up with PCP or arrange E-visit).    I spent a total of 28 minutes with the patient spent in direct patient consultation.  Additional time spent with chart review  / charting (studies, outside notes, etc): 20 min Total Time: 48 min  Current medicines are reviewed at length with the patient today.  (+/- concerns) none  This visit occurred during the SARS-CoV-2 public health emergency.  Safety protocols were in place, including screening questions prior to the visit, additional usage of staff PPE, and extensive cleaning of exam room while observing appropriate contact time as indicated for disinfecting solutions.  Notice: This dictation was prepared with Dragon dictation along with smart phrase technology. Any transcriptional errors that result from this process are unintentional and may not be corrected upon review.  Patient Instructions / Medication Changes & Studies & Tests Ordered   Patient Instructions  Medication Instructions:  Stop taking Labetalol the morning  ( you can finish the bottle if you like)  and    Then start taking Bisoprolol 5 mg that evening    Restart Pravastatin 80 mg at bedtime.  *If you need a refill on your cardiac medications before your next appointment, please call  your pharmacy*   Lab Work: Not needed    Testing/Procedures  Not needed  Follow-Up: At [facility], you and your health needs are our priority.  As part of our continuing mission to provide you with exceptional heart care, we have created designated Provider Care Teams.  These Care Teams include your primary Cardiologist (physician) and Advanced Practice Providers (APPs -  Physician Assistants  and Nurse Practitioners) who all work together to provide you with the care you need, when you need it.     Your next appointment:   6 to 7 month(s) march or April 2023  The format for your next appointment:   In Person  Provider:   Glenetta Hew, MD   Other Instructions    Studies Ordered:   No orders of the defined types were placed in this encounter.    Glenetta Hew, M.D., M.S. Interventional Cardiologist   Pager # [phone] Phone # [phone] [address] [place], [place] [address]   Thank you for choosing Heartcare at [facility]!!

## 2021-09-27 DIAGNOSIS — G4733 Obstructive sleep apnea (adult) (pediatric): Secondary | ICD-10-CM | POA: Diagnosis not present

## 2021-10-01 ENCOUNTER — Other Ambulatory Visit: Payer: Self-pay

## 2021-10-01 ENCOUNTER — Telehealth: Payer: Self-pay | Admitting: Cardiology

## 2021-10-01 ENCOUNTER — Encounter: Payer: Self-pay | Admitting: Pulmonary Disease

## 2021-10-01 ENCOUNTER — Ambulatory Visit: Payer: Medicare Other | Admitting: Pulmonary Disease

## 2021-10-01 VITALS — BP 136/78 | HR 62 | Temp 98.4°F | Ht 60.0 in | Wt 178.0 lb

## 2021-10-01 DIAGNOSIS — Z9989 Dependence on other enabling machines and devices: Secondary | ICD-10-CM | POA: Diagnosis not present

## 2021-10-01 DIAGNOSIS — Z23 Encounter for immunization: Secondary | ICD-10-CM

## 2021-10-01 DIAGNOSIS — G4733 Obstructive sleep apnea (adult) (pediatric): Secondary | ICD-10-CM | POA: Diagnosis not present

## 2021-10-01 MED ORDER — SPIRIVA RESPIMAT 2.5 MCG/ACT IN AERS: 2.0000 | INHALATION_SPRAY | Freq: Every day | RESPIRATORY_TRACT | 5 refills | Status: DC

## 2021-10-01 NOTE — Telephone Encounter (Signed)
On Patients last visit,She stated Back on Pravasttin 80 MG. Patient toook Rx from 9/27-10/05 then stopped because it gave her severe headaches and elevated BP.Patient is wanting to know what to do since she has stopped taking the meds. Please reach out to patients daughter Verdie Drown) at [phone].

## 2021-10-01 NOTE — Progress Notes (Signed)
[facility]  Chief Complaint  Patient presents with   Follow-up    Sob and cough are the same since last OV. Does use breathing treatments as directed per patient. Pt. Wants to talk about going back to spiriva inhaler. Using cpap machine and feels that its working great.      Constitutional:  BP 136/78 (BP Location: Left Arm, Patient Position: Sitting)   Pulse 62   Temp 98.4 F (36.9 C) (Temporal)   Ht 5' (1.524 m)   Wt 178 lb 0.6 oz (80.8 kg)   SpO2 98%   BMI 34.77 kg/m   Past Medical History:  CAD s/p stent, HTN, HLD, OA, GERD, Depression, Anxiety, Back pain, HH, LBBB, Macular degeneration, Scoliosis, DM type 2  Past Surgical History:  Her  has a past surgical history that includes Lipoma excision (Right, ~ 2015); Diagnostic laparoscopy (Right); Pars plana vitrectomy w/ repair of macular hole (Right); LEFT HEART CATH AND CORONARY ANGIOGRAPHY (N/A, 09/18/2017); INTRAVASCULAR PRESSURE WIRE/FFR STUDY (N/A, 09/18/2017); CORONARY STENT INTERVENTION (N/A, 09/18/2017); Eye surgery; Knee arthroscopy (Right); Cataract extraction, bilateral (Bilateral, 2017); Vaginal hysterectomy; Dilation and curettage of uterus; Cardiac catheterization (6384,6659; 2009); Colonoscopy with propofol (N/A, 11/09/2018); polypectomy (11/09/2018); and transthoracic echocardiogram (01/2020).  Brief Summary:  Vickie [name] is a 75 y.o. female former smoker with dyspnea after COVID 19 pneumonia in January 2021, and obstructive sleep apnea.      Subjective:   She is here with her daughter.  She finally got her CPAP machine.  This has been working well.  She isn't having any trouble with mask fit.  Still has oxygen set up at home, but no longer using it.  Not having cough, wheeze, or sputum.  She felt that spiriva worked better than breo, and would like to switch back to this.  She was seen by cardiology and had pravastatin restarted.  She developed a headache that  improved after she stopped taking pravastatin.  She hasn't informed her cardiologist yet about issue she had with pravastatin.  She hasn't started taking bisoprolol yet.   Physical Exam:   Appearance - well kempt   ENMT - no sinus tenderness, no oral exudate, no LAN, Mallampati 3 airway, no stridor  Respiratory - equal breath sounds bilaterally, no wheezing or rales  CV - s1s2 regular rate and rhythm, no murmurs  Ext - no clubbing, no edema, missing several digits on both hands  Skin - no rashes  Psych - normal mood and affect   Pulmonary testing:  PFT 05/22/20 >> FEV1 1.44 (79%), FEV1% 88, TLC 3.36 (75%), DLCO 76% PFT 05/28/21 >> FEV1 1.48 (83%), FEV1% 83, TLC 3.80 (85%), DLCO 106%  Chest Imaging:  CT angio chest 12/29/19 >> multifocal GGO HRCT chest 06/15/20 >> atherosclerosis, prominent patchy peribronchovascular and subpleural reticulation, minimal GGO, mild/mod traction BTX HRCT chest 06/17/21 >> patchy areas of ground-glass attenuation, septal thickening, thickening of the peribronchovascular interstitium, scattere areas of mild subpleural reticulation and regional areas of architectural distortion, moderate air trapping  Sleep Tests:  HST 08/26/20 >> AHI 7, SpO2 low 81%.  Spent 354.2 min with SpO2 < 89%. CPAP titration 10/06/20 >> CPAP 9 cm H2O >> AHI 5.6, didn't need supplemental oxygen. CPAP 08/31/21 to 09/29/21 >> used on 30 of 30 nights with average 8 hrs 58 min.  Average AHI 3.3 with CPAP 9 cm H2O  Cardiac Tests:  Echo 08/12/21 >> EF 55 to 60%  Social History:  She  reports  that she quit smoking about 45 years ago. Her smoking use included cigarettes. She has a 25.50 pack-year smoking history. She has never used smokeless tobacco. She reports that she does not drink alcohol and does not use drugs.  Family History:  Her family history includes Breast cancer in her maternal aunt and paternal aunt; CAD in her mother.     Assessment/Plan:   ILD after COVID 19 infection  in January 2021. - continue clinical monitoring - high dose flu shot today  Asthmatic bronchitis. - stiolto caused chest discomfort - she would like to try spiriva again in place of breo - spiriva respimat 2.5 mcg two puffs daily - prn albuterol  Chronic respiratory failure with hypoxia. - will arrange for overnight oximetry with CPAP and then determine if she can have home oxygen set up discontinued  Obstructive sleep apnea. - she is compliant with CPAP and reports benefit from therapy - she uses [facility] for her DME - continue CPAP 9 cm H2O  Coronary artery disease. - followed by Dr. Glenetta Hew with [facility] - advised her to inform Dr. Ellyn Hack of the issues she was having with pravastatin use  GERD, Esophageal dysphagia. - followed by Dr. Hildred Laser with Gastroenterology  Time Spent Involved in Patient Care on Day of Examination:  37 minutes  Follow up:   Patient Instructions  Spiriva two puffs daily  Stop using breo once you start using spiriva  Will arrange for overnight oxygen test with you using CPAP and then let you know if you can send your home oxygen set up back  High dose flu shot today  Follow up in 6 months  Medication List:   Allergies as of 10/01/2021       Reactions   Adhesive [tape] Other (See Comments)   REACTION: redness/irritation at application site. **Certain bandages/adhesives cause this reaction**   Avelox [moxifloxacin] Anaphylaxis   Bactrim [sulfamethoxazole-trimethoprim] Anaphylaxis, Shortness Of Breath, Rash, Other (See Comments)   REACTION: Choking, inability to swallow, redness   Levaquin [levofloxacin] Anaphylaxis, Shortness Of Breath, Rash, Other (See Comments)   Reaction:Choking Brand name Levaquin ok per pt   Mucinex [guaifenesin Er] Anaphylaxis, Shortness Of Breath, Rash   Penicillins Other (See Comments)   "Passed out" Has patient had a PCN reaction causing immediate rash, facial/tongue/throat swelling, SOB or  lightheadedness with hypotension: Unknown Has patient had a PCN reaction causing severe rash involving mucus membranes or skin necrosis: No Has patient had a PCN reaction that required hospitalization: No Has patient had a PCN reaction occurring within the last 10 years: No If all of the above answers are "NO", then may proceed with Cephalosporin use.   Sudafed [pseudoephedrine Hcl] Shortness Of Breath, Rash, Other (See Comments)   REACTION: Choking, redness, inability to swallow   Crestor [rosuvastatin] Other (See Comments)   Leg pain   Lipitor [atorvastatin] Other (See Comments)   Leg pain   Vytorin [ezetimibe-simvastatin] Other (See Comments)   Leg pain   Fish Oil Nausea And Vomiting   Norvasc [amlodipine] Other (See Comments)   unknown   Peanut-containing Drug Products    Shrimp [shellfish Allergy]         Medication List        Accurate as of October 01, 2021 12:15 PM. If you have any questions, ask your nurse or doctor.          STOP taking these medications    Breo Ellipta 200-25 MCG/INH Aepb Generic drug: fluticasone furoate-vilanterol Stopped  by: Chesley Mires, MD       TAKE these medications    albuterol 108 (90 Base) MCG/ACT inhaler Commonly known as: VENTOLIN HFA Inhale 2 puffs into the lungs every 6 (six) hours as needed for wheezing or shortness of breath.   albuterol (2.5 MG/3ML) 0.083% nebulizer solution Commonly known as: PROVENTIL Take 3 mLs (2.5 mg total) by nebulization every 6 (six) hours as needed for wheezing or shortness of breath.   ALPRAZolam 0.5 MG tablet Commonly known as: XANAX Take 0.25 mg by mouth 2 (two) times daily.   AMBULATORY NON FORMULARY MEDICATION Inject 300 mg into the skin as directed. Medication Name:Inclisiran sodium 300 mg vs placebo   bisoprolol 5 MG tablet Commonly known as: ZEBETA Take 1 tablet (5 mg total) by mouth every evening.   busPIRone 15 MG tablet Commonly known as: BUSPAR Take 15 mg by mouth 2 (two)  times daily.   CALCIUM 600 + D PO Take 1 tablet by mouth daily.   CoQ10 100 MG Caps Take 100 mg by mouth every morning.   CRANBERRY PO Take 1 capsule by mouth daily.   escitalopram 20 MG tablet Commonly known as: LEXAPRO Take 20 mg by mouth every morning.   esomeprazole 20 MG capsule Commonly known as: NEXIUM Take 20 mg by mouth daily.   furosemide 20 MG tablet Commonly known as: LASIX TAKE ONE TABLET EVERY OTHER DAY. What changed: See the new instructions.   HAIR/SKIN/NAILS PO Take 1 tablet by mouth at bedtime.   isosorbide mononitrate 60 MG 24 hr tablet Commonly known as: IMDUR TAKE ONE TABLET BY MOUTH ONCE DAILY.   mirtazapine 15 MG tablet Commonly known as: REMERON Take 7.5 mg by mouth at bedtime.   multivitamin tablet Take 1 tablet by mouth at bedtime.   nitroGLYCERIN 0.4 MG SL tablet Commonly known as: NITROSTAT PLACE 1 TAB UNDER TONGUE EVERY 5 MIN IF NEEDED FOR CHEST PAIN. MAY USE 3 TIMES.NO RELIEF CALL 911. What changed:  how much to take how to take this when to take this reasons to take this   potassium chloride SA 20 MEQ tablet Commonly known as: KLOR-CON TAKE 1 TABLET EVERY OTHER DAY, TAKE ON DAYS THAT YOU TAKE FUROSEMIDE.   pravastatin 80 MG tablet Commonly known as: PRAVACHOL Take 1 tablet (80 mg total) by mouth every evening.   Spiriva Respimat 2.5 MCG/ACT Aers Generic drug: Tiotropium Bromide Monohydrate Inhale 2 puffs into the lungs daily. Started by: Chesley Mires, MD        Signature:  Chesley Mires, MD [place] Pager - [phone] 10/01/2021, 12:15 PM

## 2021-10-01 NOTE — Patient Instructions (Signed)
Spiriva two puffs daily  Stop using breo once you start using spiriva  Will arrange for overnight oxygen test with you using CPAP and then let you know if you can send your home oxygen set up back  High dose flu shot today  Follow up in 6 months

## 2021-10-03 DIAGNOSIS — U071 COVID-19: Secondary | ICD-10-CM | POA: Diagnosis not present

## 2021-10-03 NOTE — Telephone Encounter (Signed)
Not likely to be related to pravastatin. ->   Try to restart by taking 1/2 tablet daily. Hopefully, she will be able to get the prescription for Inclisiran.  She was involved in the clinical trial --> will forward to Pharm D team.   Glenetta Hew, MD

## 2021-10-05 ENCOUNTER — Other Ambulatory Visit: Payer: Self-pay

## 2021-10-05 VITALS — BP 164/70 | HR 91 | Wt 178.0 lb

## 2021-10-05 DIAGNOSIS — Z006 Encounter for examination for normal comparison and control in clinical research program: Secondary | ICD-10-CM

## 2021-10-05 NOTE — Research (Signed)
Subject came into research clinic today for their Day 1080 in the [facility] [employer]. All concomitant medications were reviewed and updated if applicable. Vitals completed and labs drawn. Subject came in with no complaints at this time, no AE's and no SAE's to report. Subject was examined by Dr.Stuckey. No additional study visits were made because this is the end of the study.

## 2021-10-05 NOTE — Telephone Encounter (Signed)
Vickie [name], is this patient still enrolled in the [place] trial?

## 2021-10-05 NOTE — Telephone Encounter (Signed)
Thank you.  Did not want to consider new medications without checking with you first

## 2021-10-11 DIAGNOSIS — H02021 Mechanical entropion of right upper eyelid: Secondary | ICD-10-CM | POA: Diagnosis not present

## 2021-10-11 DIAGNOSIS — H02011 Cicatricial entropion of right upper eyelid: Secondary | ICD-10-CM | POA: Diagnosis not present

## 2021-10-11 DIAGNOSIS — H02024 Mechanical entropion of left upper eyelid: Secondary | ICD-10-CM | POA: Diagnosis not present

## 2021-10-11 DIAGNOSIS — H02413 Mechanical ptosis of bilateral eyelids: Secondary | ICD-10-CM | POA: Diagnosis not present

## 2021-10-11 DIAGNOSIS — R234 Changes in skin texture: Secondary | ICD-10-CM | POA: Diagnosis not present

## 2021-10-11 DIAGNOSIS — H02831 Dermatochalasis of right upper eyelid: Secondary | ICD-10-CM | POA: Diagnosis not present

## 2021-10-11 DIAGNOSIS — H02014 Cicatricial entropion of left upper eyelid: Secondary | ICD-10-CM | POA: Diagnosis not present

## 2021-10-11 DIAGNOSIS — H02834 Dermatochalasis of left upper eyelid: Secondary | ICD-10-CM | POA: Diagnosis not present

## 2021-10-19 ENCOUNTER — Telehealth: Payer: Self-pay | Admitting: Pulmonary Disease

## 2021-10-19 NOTE — Telephone Encounter (Signed)
VS please advise if you want the ONO done via [facility] or via our office.  Will she need to have this test with oxygen or without?  thanks

## 2021-10-20 ENCOUNTER — Encounter: Payer: Self-pay | Admitting: Cardiology

## 2021-10-20 NOTE — Assessment & Plan Note (Signed)
Need to have her restart taking aspirin 81 mg.

## 2021-10-20 NOTE — Telephone Encounter (Signed)
There is order placed on 10/01/21 for overnight oximetry with CPAP.  Please check with [facility] about why [facility] hasn't received order yet.

## 2021-10-20 NOTE — Assessment & Plan Note (Signed)
Continue to encourage increased level of activity and exercise.  Dietary adjustments.  The patient understands the need to lose weight with diet and exercise. We have discussed specific strategies for this.

## 2021-10-20 NOTE — Assessment & Plan Note (Signed)
I suspect that this is related to deconditioning and her lungs.  Does not seem to be related to cardiac issue.

## 2021-10-20 NOTE — Assessment & Plan Note (Signed)
Remains on pravastatin restart at 80 mg daily.. Trial is now complete.   Will need to determine if she qualifies for inclisiran having completed the trial.

## 2021-10-20 NOTE — Assessment & Plan Note (Signed)
Blood pressure looks pretty well controlled on current meds.   I have been try to change her beta-blocker for a long time.  Plan: Stop labetalol, convert to bisoprolol 5 mg on the evening of her last dose of labetalol. Continue Imdur and Lasix. With normal EF and diastolic function, can hold off on ARB as long as pressures stay stable.

## 2021-10-20 NOTE — Assessment & Plan Note (Signed)
She has lots of different types of chest pain.  1 time we found the diagonal vessel that had a disease and was treated with a stent.  Otherwise no significant disease.  Nonischemic Myoview and relative normal echocardiogram.  Nothing to suggest anterior ischemia with /s/ block.  Plan: Convert from nadolol to bisoprolol  Continue statin with hopes to start back on inclisiran since [place] trial has not completed..  Continue Imdur  We need to figure out if she is needing to be on Plavix or aspirin.  Apparently has aspirin intolerance.  Would prefer Plavix.

## 2021-10-20 NOTE — Assessment & Plan Note (Signed)
Relative normal electrocardiogram exception of showing bundle branch block wall motion, nonischemic Myoview.  At  I suspect this is progression of conduction disease.  She did have some borderline IVCD in the past..

## 2021-10-22 DIAGNOSIS — F419 Anxiety disorder, unspecified: Secondary | ICD-10-CM | POA: Diagnosis not present

## 2021-10-22 NOTE — Telephone Encounter (Signed)
Order was placed on 10/7 for ono to be done on cpap.  Order was sent to [facility] on 10/10 and Danielle responded on 10/10 that order was received.  I have called Danielle & left her vm asking for status of order.

## 2021-10-23 ENCOUNTER — Other Ambulatory Visit: Payer: Self-pay | Admitting: Cardiology

## 2021-10-23 NOTE — Progress Notes (Signed)
Yep - was pretty sure that she was in the Inclisirin arm, was hoping to get her set up for continued Rx.  Glenetta Hew c

## 2021-10-25 ENCOUNTER — Telehealth: Payer: Self-pay | Admitting: Pharmacist

## 2021-10-25 NOTE — Telephone Encounter (Signed)
Pt intolerant to rosuvastatin, atorvastatin, Vytorin, and fish oil. Currently taking pravastatin 80mg  daily. Previously enrolled in [place] 10 trial with most recent dose of inclisiran given 6 months ago.  Her insurance covers Repatha well ($37/1 month supply or $74/3 month supply), Praluent and Nexletol/Nexlizet not on formulary. Unsure how well they will cover Leqvio, would need to have her fill out benefit investigation form.  [place] - please call pt and schedule her an appt with lipid clinic (follows at [place] office) in mid December.  Delayed appt will allow for better Leqvio washout as updated baseline lipid will be needed to pursue branded lipid meds. If she has a strong preference for staying on Leqvio vs trying Repatha, you can mail her the benefit investigation form to sign ahead of time so we can see if it's covered by her insurance well.

## 2021-10-25 NOTE — Telephone Encounter (Signed)
-----   Message from Hillary Bow, MD sent at 10/22/2021  3:32 PM EDT ----- Dr. Silas Flood, and [place] Canty  Vickie [name] recently completed her participation in the [place] 10 trial.  She received insclisiran for an extended period free after participation in the [place] 8 randomized trial.  ORION 10 ended, and she  received her last dose about 6 months ago. It takes several months to washout of the system.  Dr. Ellyn Hack mentioned in his note that he would refer to Pharm clinic for lipid options, and I just wanted to ensure that occurs.  I had asked her to make contact with Dr. Hilma Favors and Ellyn Hack at her final research visit.  Gershon Mussel

## 2021-10-25 NOTE — Telephone Encounter (Signed)
Called and spoke w/pt and they stated that they could come in 11/23/21 at 10am

## 2021-10-25 NOTE — Telephone Encounter (Signed)
I called and spoke to [place] [facility].  She sent e-mail to scheduling team on Friday and ask them to contact the pt.  She is going to call pt now and let her know they do have order and someone will be contacting her.

## 2021-11-01 DIAGNOSIS — I10 Essential (primary) hypertension: Secondary | ICD-10-CM | POA: Diagnosis not present

## 2021-11-01 DIAGNOSIS — E1140 Type 2 diabetes mellitus with diabetic neuropathy, unspecified: Secondary | ICD-10-CM | POA: Diagnosis not present

## 2021-11-01 DIAGNOSIS — F419 Anxiety disorder, unspecified: Secondary | ICD-10-CM | POA: Diagnosis not present

## 2021-11-01 DIAGNOSIS — J329 Chronic sinusitis, unspecified: Secondary | ICD-10-CM | POA: Diagnosis not present

## 2021-11-03 DIAGNOSIS — U071 COVID-19: Secondary | ICD-10-CM | POA: Diagnosis not present

## 2021-11-16 NOTE — Research (Signed)
Late entry Study medication completed per protocol.

## 2021-11-16 NOTE — Addendum Note (Signed)
Addended by: Sandie Ano D on: 11/16/2021 04:09 PM   Modules accepted: Orders

## 2021-11-23 ENCOUNTER — Ambulatory Visit (INDEPENDENT_AMBULATORY_CARE_PROVIDER_SITE_OTHER): Payer: Medicare Other | Admitting: Pharmacist

## 2021-11-23 ENCOUNTER — Encounter: Payer: Self-pay | Admitting: Pharmacist

## 2021-11-23 ENCOUNTER — Other Ambulatory Visit: Payer: Self-pay

## 2021-11-23 DIAGNOSIS — E785 Hyperlipidemia, unspecified: Secondary | ICD-10-CM

## 2021-11-23 DIAGNOSIS — Z9861 Coronary angioplasty status: Secondary | ICD-10-CM

## 2021-11-23 DIAGNOSIS — I2510 Atherosclerotic heart disease of native coronary artery without angina pectoris: Secondary | ICD-10-CM

## 2021-11-23 NOTE — Telephone Encounter (Signed)
This encounter was created in error - please disregard.

## 2021-11-23 NOTE — Patient Instructions (Addendum)
It was nice meeting you today!  We would like your LDL (bad cholesterol) to be less than 55.  We will check it today.  I will submit the information for you for the Leqvio and we will contact you when they tell us a copay  If it is too expensive for you, we can try the every 2 week injection  Please give [place] a call with any questions  Karren Cobble, PharmD, [place], [place], [place], [place], [place], [address] Phone: [phone], Fax: [phone]

## 2021-11-23 NOTE — Progress Notes (Signed)
Patient ID: Vickie [name]                 DOB: 1946/11/12                    MRN: [id number]     HPI: Vickie [name] is a 75 y.o. female patient referred to lipid clinic by Dr Vickie [name].  Patient recently completed [place]ION study and received inclisiran.  PMH is significant for DM, HTN, MI, CAD, and has multiple statin intolerances.  Patient presents today with daughter who is involved in most health care decisions.  Is intolerant to rosuvastatin, atorvastatin, ezetimibe, and simvastatin.  Can tolerate max dose pravastatin and is currently taking 80mg  daily. However has not had lab work updated recently due to being in the clinical trial.    Current Medications: pravastatin 80mg  Intolerances: Crestor, Lipitor, Zocor, Zetia Risk Factors: CAD, DM, HTN, HLD LDL goal: <55  Labs: Unknown  Past Medical History:  Diagnosis Date   Anginal pain (Vickie [name])    Anxiety    Anxiety disorder    With apparent panic attacks   Arthritis    "hands, knees, back" (09/18/2017)   CAD S/P percutaneous coronary angioplasty 08/2017   Single-vessel CAD involving second diagonal branch treated with DES stent Synergy 2.25 mm x 12 mm (2.4 mm)   Chronic back pain    "all over" (09/18/2017)   COPD (chronic obstructive pulmonary disease) ([place])    PFTs were done in 2008 at [name]   Depression    Dyspnea    Dysrhythmia    LBBB   Essential hypertension    GERD (gastroesophageal reflux disease)    Hiatal hernia    History of left bundle branch block (LBBB) 08/2017   Rate Related LBBB noted in cath lab   Hyperlipidemia    Macular degeneration    right eye   Myocardial infarction Vickie [name]) 2007   "medically induced"   Nonocclusive coronary atherosclerosis of native coronary artery 2006 through 2012   multi caths 2012, 2006, [id number] (0102 complicated by catheter-induced dissection of small nondominant RCA, patent in 2009 with no residual abnormality); Myoview August 2013: LOW RISK, normal EF. ; Echocardiogram Vickie [name]  Vickie [name] - January 2014) moderate LVH, EF 55-65%. No significant valvular disease.   OSA (obstructive sleep apnea) 9/25/ 2012   tested 2009; tetested sleep study 07/2011--titration 09/20/2011 now use Bi-PAP   OSA on CPAP    Pneumonia    "several times in 2017; 3 times already this year" (09/18/2017)   Scoliosis    Type II diabetes mellitus ([place])     Current Outpatient Medications on File Prior to Visit  Medication Sig Dispense Refill   albuterol (PROVENTIL) (2.5 MG/3ML) 0.083% nebulizer solution Take 3 mLs (2.5 mg total) by nebulization every 6 (six) hours as needed for wheezing or shortness of breath. 360 mL 5   albuterol (VENTOLIN HFA) 108 (90 Base) MCG/ACT inhaler Inhale 2 puffs into the lungs every 6 (six) hours as needed for wheezing or shortness of breath. 8 g 6   ALPRAZolam (XANAX) 0.5 MG tablet Take 0.25 mg by mouth 2 (two) times daily.     Biotin w/ Vitamins C & E (HAIR/SKIN/NAILS PO) Take 1 tablet by mouth at bedtime.     bisoprolol (ZEBETA) 5 MG tablet Take 1 tablet (5 mg total) by mouth every evening. 90 tablet 3   busPIRone (BUSPAR) 15 MG tablet Take 15 mg by mouth 2 (two) times daily.  Calcium Carb-Cholecalciferol (CALCIUM 600 + D PO) Take 1 tablet by mouth daily.      Coenzyme Q10 (COQ10) 100 MG CAPS Take 100 mg by mouth every morning.     CRANBERRY PO Take 1 capsule by mouth daily.      escitalopram (LEXAPRO) 20 MG tablet Take 20 mg by mouth every morning.      esomeprazole (NEXIUM) 20 MG capsule Take 20 mg by mouth daily.     furosemide (LASIX) 20 MG tablet TAKE ONE TABLET EVERY OTHER DAY. (Patient taking differently: Take 20 mg by mouth every other day.) 90 tablet 3   isosorbide mononitrate (IMDUR) 60 MG 24 hr tablet TAKE ONE TABLET BY MOUTH ONCE DAILY. 30 tablet 11   mirtazapine (REMERON) 15 MG tablet Take 7.5 mg by mouth at bedtime.      Multiple Vitamin (MULTIVITAMIN) tablet Take 1 tablet by mouth at bedtime.      nitroGLYCERIN (NITROSTAT) 0.4 MG SL tablet PLACE 1 TAB  UNDER TONGUE EVERY 5 MIN IF NEEDED FOR CHEST PAIN. MAY USE 3 TIMES.NO RELIEF CALL 911. (Patient taking differently: Place 0.4 mg under the tongue every 5 (five) minutes as needed for chest pain. PLACE 1 TAB UNDER TONGUE EVERY 5 MIN IF NEEDED FOR CHEST PAIN. MAY USE 3 TIMES.NO RELIEF CALL 911.) 25 tablet 5   potassium chloride SA (KLOR-CON) 20 MEQ tablet TAKE 1 TABLET EVERY OTHER DAY, TAKE ON DAYS THAT YOU TAKE FUROSEMIDE. 45 tablet 6   pravastatin (PRAVACHOL) 80 MG tablet Take 1 tablet (80 mg total) by mouth every evening. 90 tablet 3   Tiotropium Bromide Monohydrate (SPIRIVA RESPIMAT) 2.5 MCG/ACT AERS Inhale 2 puffs into the lungs daily. 4 g 5   No current facility-administered medications on file prior to visit.    Allergies  Allergen Reactions   Adhesive [Tape] Other (See Comments)    REACTION: redness/irritation at application site. **Certain bandages/adhesives cause this reaction**   Avelox [Moxifloxacin] Anaphylaxis   Bactrim [Sulfamethoxazole-Trimethoprim] Anaphylaxis, Shortness Of Breath, Rash and Other (See Comments)    REACTION: Choking, inability to swallow, redness   Levaquin [Levofloxacin] Anaphylaxis, Shortness Of Breath, Rash and Other (See Comments)    Reaction:Choking Brand name Levaquin ok per pt   Mucinex [Guaifenesin Er] Anaphylaxis, Shortness Of Breath and Rash   Penicillins Other (See Comments)    "Passed out" Has patient had a PCN reaction causing immediate rash, facial/tongue/throat swelling, SOB or lightheadedness with hypotension: Unknown Has patient had a PCN reaction causing severe rash involving mucus membranes or skin necrosis: No Has patient had a PCN reaction that required hospitalization: No Has patient had a PCN reaction occurring within the last 10 years: No If all of the above answers are "NO", then may proceed with Cephalosporin use.    Sudafed [Pseudoephedrine Hcl] Shortness Of Breath, Rash and Other (See Comments)    REACTION: Choking, redness,  inability to swallow   Crestor [Rosuvastatin] Other (See Comments)    Leg pain   Lipitor [Atorvastatin] Other (See Comments)    Leg pain   Vytorin [Ezetimibe-Simvastatin] Other (See Comments)    Leg pain   Fish Oil Nausea And Vomiting   Norvasc [Amlodipine] Other (See Comments)    unknown   Peanut-Containing Drug Products    Shrimp [Shellfish Allergy]     Assessment/Plan:  1. Hyperlipidemia - Unknown what patient's current LDL is due to being in trial, however needs aggressive goal lowering to at least <55 due to DM, CAD, and history of MI.  Discussed next options with patient which include continuing Leqvio or changing to PCSK9i.  Advised that we can submit the application for Leqvio but without a supplement plan, her copay may be cost prohibitive.  Advised that Repatha/Praluent may be more cost effective and there are grant programs available.  Patient hesitant to perform self injections however daughter is a former Marine scientist and is comfortable.  Using demo pen, educated daughter on storage, mechanism of action, site selection, administration, and possible adverse effects.  If [place] Downer is cost prohibitive, will complete PA for Repatha/Praluent.  Continue pravastatin 80mg  daily Check lipid panel today Submit form for Lauro Franklin, PharmD, BCACP, [place], [place], [place], [place], [address] Phone: [phone], Fax: [phone]

## 2021-11-24 DIAGNOSIS — J9811 Atelectasis: Secondary | ICD-10-CM | POA: Diagnosis not present

## 2021-11-24 DIAGNOSIS — G4489 Other headache syndrome: Secondary | ICD-10-CM | POA: Diagnosis not present

## 2021-11-24 DIAGNOSIS — I700 Atherosclerosis of aorta: Secondary | ICD-10-CM | POA: Diagnosis not present

## 2021-11-24 DIAGNOSIS — M4322 Fusion of spine, cervical region: Secondary | ICD-10-CM | POA: Diagnosis not present

## 2021-11-24 DIAGNOSIS — S0990XA Unspecified injury of head, initial encounter: Secondary | ICD-10-CM | POA: Diagnosis not present

## 2021-11-24 DIAGNOSIS — R0789 Other chest pain: Secondary | ICD-10-CM | POA: Diagnosis not present

## 2021-11-24 DIAGNOSIS — Z041 Encounter for examination and observation following transport accident: Secondary | ICD-10-CM | POA: Diagnosis not present

## 2021-11-24 DIAGNOSIS — R0902 Hypoxemia: Secondary | ICD-10-CM | POA: Diagnosis not present

## 2021-11-24 DIAGNOSIS — S20213A Contusion of bilateral front wall of thorax, initial encounter: Secondary | ICD-10-CM | POA: Diagnosis not present

## 2021-11-24 DIAGNOSIS — Z981 Arthrodesis status: Secondary | ICD-10-CM | POA: Diagnosis not present

## 2021-11-24 DIAGNOSIS — J849 Interstitial pulmonary disease, unspecified: Secondary | ICD-10-CM | POA: Diagnosis not present

## 2021-11-24 DIAGNOSIS — R079 Chest pain, unspecified: Secondary | ICD-10-CM | POA: Diagnosis not present

## 2021-11-24 DIAGNOSIS — J439 Emphysema, unspecified: Secondary | ICD-10-CM | POA: Diagnosis not present

## 2021-11-24 LAB — LIPID PANEL
Chol/HDL Ratio: 4.3 ratio (ref 0.0–4.4)
Cholesterol, Total: 212 mg/dL — ABNORMAL HIGH (ref 100–199)
HDL: 49 mg/dL (ref >39)
LDL Chol Calc (NIH): 122 mg/dL — ABNORMAL HIGH (ref 0–99)
Triglycerides: 233 mg/dL — ABNORMAL HIGH (ref 0–149)
VLDL Cholesterol Cal: 41 mg/dL — ABNORMAL HIGH (ref 5–40)

## 2021-11-25 DIAGNOSIS — R0683 Snoring: Secondary | ICD-10-CM | POA: Diagnosis not present

## 2021-11-25 DIAGNOSIS — G4730 Sleep apnea, unspecified: Secondary | ICD-10-CM | POA: Diagnosis not present

## 2021-11-27 DIAGNOSIS — G4733 Obstructive sleep apnea (adult) (pediatric): Secondary | ICD-10-CM | POA: Diagnosis not present

## 2021-11-29 DIAGNOSIS — E1151 Type 2 diabetes mellitus with diabetic peripheral angiopathy without gangrene: Secondary | ICD-10-CM | POA: Diagnosis not present

## 2021-11-29 DIAGNOSIS — G4733 Obstructive sleep apnea (adult) (pediatric): Secondary | ICD-10-CM | POA: Diagnosis not present

## 2021-11-29 DIAGNOSIS — E1140 Type 2 diabetes mellitus with diabetic neuropathy, unspecified: Secondary | ICD-10-CM | POA: Diagnosis not present

## 2021-12-01 DIAGNOSIS — R0683 Snoring: Secondary | ICD-10-CM | POA: Diagnosis not present

## 2021-12-01 DIAGNOSIS — G4730 Sleep apnea, unspecified: Secondary | ICD-10-CM | POA: Diagnosis not present

## 2021-12-02 ENCOUNTER — Telehealth: Payer: Self-pay | Admitting: Pulmonary Disease

## 2021-12-02 NOTE — Telephone Encounter (Signed)
ONO with CPAP 11/25/21 >> test time 8 hrs 32 min.  Baseline SpO2 90%, low SpO2 83%.  Spent 2 hrs 19 min with SpO2 < 88%.   Please let her know her oxygen level is still low at night.  She needs to continue using CPAP with supplemental oxygen while asleep.

## 2021-12-03 DIAGNOSIS — U071 COVID-19: Secondary | ICD-10-CM | POA: Diagnosis not present

## 2021-12-03 NOTE — Telephone Encounter (Signed)
Spoke with patient  regarding ONO results. They verbalized understanding. No further questions.  Nothing further needed at this time.  

## 2021-12-07 ENCOUNTER — Telehealth: Payer: Self-pay | Admitting: Pharmacist

## 2021-12-07 DIAGNOSIS — E785 Hyperlipidemia, unspecified: Secondary | ICD-10-CM

## 2021-12-07 NOTE — Telephone Encounter (Signed)
Please contact patient and let her know Marion Downer will be cost prohibitive.  Will be able to qualify for Repatha/Praluent if daughter is willing to administer it.

## 2021-12-07 NOTE — Telephone Encounter (Signed)
Called and spoke w/pt and they were agreeable to the repatha 140mg  q14d. Will complete pa for repatha. Vickie [name] (Key: [id number]) Repatha SureClick 140MG /ML auto-injectors Lipid panel ordered

## 2021-12-08 MED ORDER — REPATHA SURECLICK 140 MG/ML ~~LOC~~ SOAJ: 140.0000 mg | SUBCUTANEOUS | 11 refills | Status: DC

## 2021-12-08 NOTE — Addendum Note (Signed)
Addended by: Allean Found on: 12/08/2021 03:06 PM   Modules accepted: Orders

## 2021-12-08 NOTE — Telephone Encounter (Signed)
Lmom pt of repatha approval, rx sent, pt instructed to complete fasting labs post fourth dose and call if they have issues

## 2021-12-23 DIAGNOSIS — Z6836 Body mass index (BMI) 36.0-36.9, adult: Secondary | ICD-10-CM | POA: Diagnosis not present

## 2021-12-23 DIAGNOSIS — S134XXA Sprain of ligaments of cervical spine, initial encounter: Secondary | ICD-10-CM | POA: Diagnosis not present

## 2021-12-23 DIAGNOSIS — E669 Obesity, unspecified: Secondary | ICD-10-CM | POA: Diagnosis not present

## 2021-12-24 DIAGNOSIS — E782 Mixed hyperlipidemia: Secondary | ICD-10-CM | POA: Diagnosis not present

## 2021-12-24 DIAGNOSIS — N182 Chronic kidney disease, stage 2 (mild): Secondary | ICD-10-CM | POA: Diagnosis not present

## 2021-12-24 DIAGNOSIS — I10 Essential (primary) hypertension: Secondary | ICD-10-CM | POA: Diagnosis not present

## 2021-12-24 DIAGNOSIS — J449 Chronic obstructive pulmonary disease, unspecified: Secondary | ICD-10-CM | POA: Diagnosis not present

## 2021-12-29 ENCOUNTER — Telehealth: Payer: Self-pay | Admitting: Pulmonary Disease

## 2021-12-30 DIAGNOSIS — G4733 Obstructive sleep apnea (adult) (pediatric): Secondary | ICD-10-CM | POA: Diagnosis not present

## 2021-12-30 NOTE — Telephone Encounter (Signed)
Called and spoke to pt. Pt states she received a bill from [facility] [facility] and is unsure why. Pt states she hasnt received a bill for her O2 before because its always been needed. Pt has been on O2 since 2021. Community message has been sent to [employer] to look into this. Will update chart with response.

## 2022-01-03 DIAGNOSIS — U071 COVID-19: Secondary | ICD-10-CM | POA: Diagnosis not present

## 2022-01-06 DIAGNOSIS — Z6837 Body mass index (BMI) 37.0-37.9, adult: Secondary | ICD-10-CM | POA: Diagnosis not present

## 2022-01-06 DIAGNOSIS — M542 Cervicalgia: Secondary | ICD-10-CM | POA: Diagnosis not present

## 2022-01-06 DIAGNOSIS — E669 Obesity, unspecified: Secondary | ICD-10-CM | POA: Diagnosis not present

## 2022-01-06 DIAGNOSIS — I10 Essential (primary) hypertension: Secondary | ICD-10-CM | POA: Diagnosis not present

## 2022-01-06 NOTE — Telephone Encounter (Signed)
New, Annye Asa, Zettie Pho, RN; Hubert Azure, Larene Pickett,   I have sent your message to our billing team and asked them to contact patient with details.   Thank you,   Brad New          Lmtcb for pt.

## 2022-01-06 NOTE — Telephone Encounter (Signed)
Spoke with the pt and notified of response per [employer]. She states someone from [employer] called her but she was unavailable She states she will reach back out to them for further info. Nothing further needed

## 2022-01-07 ENCOUNTER — Telehealth: Payer: Self-pay | Admitting: Pulmonary Disease

## 2022-01-07 DIAGNOSIS — J849 Interstitial pulmonary disease, unspecified: Secondary | ICD-10-CM

## 2022-01-07 NOTE — Telephone Encounter (Signed)
Spoke with the pt  She states that she stopped using o2 from [employer] and is using her late spouse's POC instead  She states she is just using o2 "whenever I think I need it"  Please advise, thanks!

## 2022-01-10 NOTE — Telephone Encounter (Signed)
Okay to send order to cancel home oxygen set up through [employer].

## 2022-01-10 NOTE — Telephone Encounter (Signed)
Called and spoke with pt letting her know that [facility] said that it was okay to cancel the O2 set up and she verbalized understanding. Nothing further needed.

## 2022-01-17 ENCOUNTER — Other Ambulatory Visit: Payer: Self-pay | Admitting: Cardiology

## 2022-01-24 DIAGNOSIS — E1140 Type 2 diabetes mellitus with diabetic neuropathy, unspecified: Secondary | ICD-10-CM | POA: Diagnosis not present

## 2022-01-24 DIAGNOSIS — N182 Chronic kidney disease, stage 2 (mild): Secondary | ICD-10-CM | POA: Diagnosis not present

## 2022-01-24 DIAGNOSIS — M5481 Occipital neuralgia: Secondary | ICD-10-CM | POA: Diagnosis not present

## 2022-01-24 DIAGNOSIS — M542 Cervicalgia: Secondary | ICD-10-CM | POA: Diagnosis not present

## 2022-01-30 DIAGNOSIS — G4733 Obstructive sleep apnea (adult) (pediatric): Secondary | ICD-10-CM | POA: Diagnosis not present

## 2022-02-02 DIAGNOSIS — Z6837 Body mass index (BMI) 37.0-37.9, adult: Secondary | ICD-10-CM | POA: Diagnosis not present

## 2022-02-02 DIAGNOSIS — R030 Elevated blood-pressure reading, without diagnosis of hypertension: Secondary | ICD-10-CM | POA: Diagnosis not present

## 2022-02-02 DIAGNOSIS — M542 Cervicalgia: Secondary | ICD-10-CM | POA: Diagnosis not present

## 2022-02-07 DIAGNOSIS — E1151 Type 2 diabetes mellitus with diabetic peripheral angiopathy without gangrene: Secondary | ICD-10-CM | POA: Diagnosis not present

## 2022-02-07 DIAGNOSIS — E1140 Type 2 diabetes mellitus with diabetic neuropathy, unspecified: Secondary | ICD-10-CM | POA: Diagnosis not present

## 2022-03-01 ENCOUNTER — Ambulatory Visit (HOSPITAL_COMMUNITY)
Admission: RE | Admit: 2022-03-01 | Discharge: 2022-03-01 | Disposition: A | Discharge status: Home / Self Care | Payer: Medicare Other | Source: Ambulatory Visit | Attending: Internal Medicine | Admitting: Internal Medicine

## 2022-03-01 ENCOUNTER — Other Ambulatory Visit: Payer: Self-pay

## 2022-03-01 ENCOUNTER — Other Ambulatory Visit (HOSPITAL_COMMUNITY): Payer: Self-pay | Admitting: Internal Medicine

## 2022-03-01 DIAGNOSIS — S299XXA Unspecified injury of thorax, initial encounter: Secondary | ICD-10-CM | POA: Diagnosis not present

## 2022-03-01 DIAGNOSIS — M545 Low back pain, unspecified: Secondary | ICD-10-CM

## 2022-03-01 DIAGNOSIS — M549 Dorsalgia, unspecified: Secondary | ICD-10-CM | POA: Diagnosis not present

## 2022-03-01 DIAGNOSIS — N182 Chronic kidney disease, stage 2 (mild): Secondary | ICD-10-CM | POA: Diagnosis not present

## 2022-03-01 DIAGNOSIS — R0781 Pleurodynia: Secondary | ICD-10-CM | POA: Diagnosis not present

## 2022-03-01 DIAGNOSIS — J329 Chronic sinusitis, unspecified: Secondary | ICD-10-CM | POA: Diagnosis not present

## 2022-03-01 DIAGNOSIS — M2578 Osteophyte, vertebrae: Secondary | ICD-10-CM | POA: Diagnosis not present

## 2022-03-29 ENCOUNTER — Encounter (INDEPENDENT_AMBULATORY_CARE_PROVIDER_SITE_OTHER): Payer: Self-pay

## 2022-03-29 ENCOUNTER — Encounter (INDEPENDENT_AMBULATORY_CARE_PROVIDER_SITE_OTHER): Payer: Self-pay | Admitting: Internal Medicine

## 2022-03-29 ENCOUNTER — Other Ambulatory Visit (INDEPENDENT_AMBULATORY_CARE_PROVIDER_SITE_OTHER): Payer: Self-pay

## 2022-03-29 ENCOUNTER — Ambulatory Visit (INDEPENDENT_AMBULATORY_CARE_PROVIDER_SITE_OTHER): Payer: Medicare Other | Admitting: Internal Medicine

## 2022-03-29 VITALS — BP 137/71 | HR 52 | Temp 98.7°F | Ht 60.0 in | Wt 180.7 lb

## 2022-03-29 DIAGNOSIS — K219 Gastro-esophageal reflux disease without esophagitis: Secondary | ICD-10-CM

## 2022-03-29 DIAGNOSIS — R1319 Other dysphagia: Secondary | ICD-10-CM

## 2022-03-29 DIAGNOSIS — Z86010 Personal history of colonic polyps: Secondary | ICD-10-CM | POA: Diagnosis not present

## 2022-03-29 NOTE — Patient Instructions (Addendum)
Esophagogastroduodenoscopy with esophageal dilation to be scheduled. ?Hemoccult to be done when stool is black   ?
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
## 2022-03-30 ENCOUNTER — Encounter (INDEPENDENT_AMBULATORY_CARE_PROVIDER_SITE_OTHER): Payer: Self-pay
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
## 2022-04-11 ENCOUNTER — Other Ambulatory Visit: Payer: Self-pay

## 2022-04-11 DIAGNOSIS — E785 Hyperlipidemia, unspecified: Secondary | ICD-10-CM

## 2022-04-12 ENCOUNTER — Encounter: Payer: Self-pay | Admitting: Physician Assistant

## 2022-04-12 ENCOUNTER — Ambulatory Visit: Payer: Medicare Other | Admitting: Physician Assistant

## 2022-04-12 VITALS — BP 136/78 | HR 60 | Ht 60.0 in | Wt 182.0 lb

## 2022-04-12 DIAGNOSIS — R0609 Other forms of dyspnea: Secondary | ICD-10-CM | POA: Diagnosis not present

## 2022-04-12 DIAGNOSIS — E785 Hyperlipidemia, unspecified: Secondary | ICD-10-CM | POA: Diagnosis not present

## 2022-04-12 DIAGNOSIS — I2510 Atherosclerotic heart disease of native coronary artery without angina pectoris: Secondary | ICD-10-CM | POA: Diagnosis not present

## 2022-04-12 DIAGNOSIS — E669 Obesity, unspecified: Secondary | ICD-10-CM

## 2022-04-12 DIAGNOSIS — Z01810 Encounter for preprocedural cardiovascular examination: Secondary | ICD-10-CM

## 2022-04-12 DIAGNOSIS — I10 Essential (primary) hypertension: Secondary | ICD-10-CM

## 2022-04-12 DIAGNOSIS — R7303 Prediabetes: Secondary | ICD-10-CM

## 2022-04-12 DIAGNOSIS — Z9861 Coronary angioplasty status: Secondary | ICD-10-CM

## 2022-04-12 NOTE — Patient Instructions (Addendum)
Medication Instructions:  ?Your physician recommends that you continue on your current medications as directed. Please refer to the Current Medication list given to you today. ? ?*If you need a refill on your cardiac medications before your next appointment, please call your pharmacy* ? ?Lab Work: ?Your physician recommends that you return for lab work TODAY:  ?Lipid Panel  ?If you have labs (blood work) drawn today and your tests are completely normal, you will receive your results only by: ?MyChart Message (if you have MyChart) OR ?A paper copy in the mail ?If you have any lab test that is abnormal or we need to change your treatment, we will call you to review the results. ? ?Testing/Procedures: ?NONE ordered at this time of appointment  ? ?Follow-Up: ?At [facility], you and your health needs are our priority.  As part of our continuing mission to provide you with exceptional heart care, we have created designated Provider Care Teams.  These Care Teams include your primary Cardiologist (physician) and Advanced Practice Providers (APPs -  Physician Assistants and Nurse Practitioners) who all work together to provide you with the care you need, when you need it. ? ?Your next appointment:   ?5 month(s) ? ?The format for your next appointment:   ?In Person ? ?Provider:   ?Glenetta Hew, MD   ? ?Other Instructions ? ? ?Important Information About Sugar ? ? ? ? ? ? ?

## 2022-04-14 ENCOUNTER — Other Ambulatory Visit: Payer: Self-pay | Admitting: Physician Assistant

## 2022-04-14 DIAGNOSIS — E785 Hyperlipidemia, unspecified: Secondary | ICD-10-CM

## 2022-04-14 LAB — LIPID PANEL
Chol/HDL Ratio: 2.5 ratio (ref 0.0–4.4)
Cholesterol, Total: 115 mg/dL (ref 100–199)
HDL: 46 mg/dL (ref >39)
LDL Chol Calc (NIH): 28 mg/dL (ref 0–99)
Triglycerides: 280 mg/dL — ABNORMAL HIGH (ref 0–149)
VLDL Cholesterol Cal: 41 mg/dL — ABNORMAL HIGH (ref 5–40)

## 2022-04-14 MED ORDER — FENOFIBRATE 145 MG PO TABS: 145.0000 mg | ORAL_TABLET | Freq: Every day | ORAL | 11 refills | Status: DC

## 2022-04-14 NOTE — Progress Notes (Signed)
I have verbally called in 145 mg fenofibrate. ?

## 2022-04-15 ENCOUNTER — Telehealth: Payer: Self-pay | Admitting: Cardiology

## 2022-04-15 NOTE — Telephone Encounter (Signed)
Spoke with daughter and made her aware of lipid results and recommendations.  She was appreciative for call.  ?

## 2022-04-15 NOTE — Telephone Encounter (Signed)
Pt's daughter states that pt was called and given test results but didn't understand. Daughter would like a callback for results to be explained to her. Please advise ?

## 2022-04-15 NOTE — Patient Instructions (Signed)
? ? ? ? ? ? ? ? Vickie [name] ? 04/15/2022  ?  ? '@PREFPERIOPPHARMACY'$ @ ? ? Your procedure is scheduled on  04/20/2022. ? ? Report to [facility] at  1330 (1:30)  P.M. ? ? Call this number if you have problems the morning of surgery: ? [phone] ? ? Remember: ? Follow the diet instructions given to you by the office. ? ?   Use your nebulizer and your inhaler before you come and bring your rescue inhaler with you. ?  ? Take these medicines the morning of surgery with A SIP OF WATER  ? ?              xanax(if needed), buspar, lexapro, nexium, imdur. ? ? ?  ? Do not wear jewelry, make-up or nail polish. ? Do not wear lotions, powders, or perfumes, or deodorant. ? Do not shave 48 hours prior to surgery.  Men may shave face and neck. ? Do not bring valuables to the hospital. ? [facility] is not responsible for any belongings or valuables. ? ?Contacts, dentures or bridgework may not be worn into surgery.  Leave your suitcase in the car.  After surgery it may be brought to your room. ? ?For patients admitted to the hospital, discharge time will be determined by your treatment team. ? ?Patients discharged the day of surgery will not be allowed to drive home and must have someone with them for 24 hours.  ? ? ?Special instructions:   DO NOT smoke tobacco or vape for 24 hours before your procedure. ? ?Please read over the following fact sheets that you were given. ?Anesthesia Post-op Instructions and Care and Recovery After Surgery ?  ? ? ? Upper Endoscopy, Adult, Care After ?This sheet gives you information about how to care for yourself after your procedure. Your health care provider may also give you more specific instructions. If you have problems or questions, contact your health care provider. ?What can I expect after the procedure? ?After the procedure, it is common to have: ?A sore throat. ?Mild stomach pain or discomfort. ?Bloating. ?Nausea. ?Follow these instructions at home: ? ?Follow instructions from your  health care provider about what to eat or drink after your procedure. ?Return to your normal activities as told by your health care provider. Ask your health care provider what activities are safe for you. ?Take over-the-counter and prescription medicines only as told by your health care provider. ?If you were given a sedative during the procedure, it can affect you for several hours. Do not drive or operate machinery until your health care provider says that it is safe. ?Keep all follow-up visits as told by your health care provider. This is important. ?Contact a health care provider if you have: ?A sore throat that lasts longer than one day. ?Trouble swallowing. ?Get help right away if: ?You vomit blood or your vomit looks like coffee grounds. ?You have: ?A fever. ?Bloody, black, or tarry stools. ?A severe sore throat or you cannot swallow. ?Difficulty breathing. ?Severe pain in your chest or abdomen. ?Summary ?After the procedure, it is common to have a sore throat, mild stomach discomfort, bloating, and nausea. ?If you were given a sedative during the procedure, it can affect you for several hours. Do not drive or operate machinery until your health care provider says that it is safe. ?Follow instructions from your health care provider about what to eat or drink after your procedure. ?Return to your normal activities as told by  your health care provider. ?This information is not intended to replace advice given to you by your health care provider. Make sure you discuss any questions you have with your health care provider. ?Document Revised: 10/18/2019 Document Reviewed: 05/14/2018 ?Elsevier Patient Education ? [employer]. ?Esophageal Dilatation ?Esophageal dilatation, also called esophageal dilation, is a procedure to widen or open a blocked or narrowed part of the esophagus. The esophagus is the part of the body that moves food and liquid from the mouth to the stomach. You may need this procedure  if: ?You have a buildup of scar tissue in your esophagus that makes it difficult, painful, or impossible to swallow. This can be caused by gastroesophageal reflux disease (GERD). ?You have cancer of the esophagus. ?There is a problem with how food moves through your esophagus. ?In some cases, you may need this procedure repeated at a later time to dilate the esophagus gradually. ?Tell a health care provider about: ?Any allergies you have. ?All medicines you are taking, including vitamins, herbs, eye drops, creams, and over-the-counter medicines. ?Any problems you or family members have had with anesthetic medicines. ?Any blood disorders you have. ?Any surgeries you have had. ?Any medical conditions you have. ?Any antibiotic medicines you are required to take before dental procedures. ?Whether you are pregnant or may be pregnant. ?What are the risks? ?Generally, this is a safe procedure. However, problems may occur, including: ?Bleeding due to a tear in the lining of the esophagus. ?A hole, or perforation, in the esophagus. ?What happens before the procedure? ?Ask your health care provider about: ?Changing or stopping your regular medicines. This is especially important if you are taking diabetes medicines or blood thinners. ?Taking medicines such as aspirin and ibuprofen. These medicines can thin your blood. Do not take these medicines unless your health care provider tells you to take them. ?Taking over-the-counter medicines, vitamins, herbs, and supplements. ?Follow instructions from your health care provider about eating or drinking restrictions. ?Plan to have a responsible adult take you home from the hospital or clinic. ?Plan to have a responsible adult care for you for the time you are told after you leave the hospital or clinic. This is important. ?What happens during the procedure? ?You may be given a medicine to help you relax (sedative). ?A numbing medicine may be sprayed into the back of your throat, or  you may gargle the medicine. ?Your health care provider may perform the dilatation using various surgical instruments, such as: ?Simple dilators. This instrument is carefully placed in the esophagus to stretch it. ?Guided wire bougies. This involves using an endoscope to insert a wire into the esophagus. A dilator is passed over this wire to enlarge the esophagus. Then the wire is removed. ?Balloon dilators. An endoscope with a small balloon is inserted into the esophagus. The balloon is inflated to stretch the esophagus and open it up. ?The procedure may vary among health care providers and hospitals. ?What can I expect after the procedure? ?Your blood pressure, heart rate, breathing rate, and blood oxygen level will be monitored until you leave the hospital or clinic. ?Your throat may feel slightly sore and numb. This will get better over time. ?You will not be allowed to eat or drink until your throat is no longer numb. ?When you are able to drink, urinate, and sit on the edge of the bed without nausea or dizziness, you may be able to return home. ?Follow these instructions at home: ?Take over-the-counter and prescription medicines only as  told by your health care provider. ?If you were given a sedative during the procedure, it can affect you for several hours. Do not drive or operate machinery until your health care provider says that it is safe. ?Plan to have a responsible adult care for you for the time you are told. This is important. ?Follow instructions from your health care provider about any eating or drinking restrictions. ?Do not use any products that contain nicotine or tobacco, such as cigarettes, e-cigarettes, and chewing tobacco. If you need help quitting, ask your health care provider. ?Keep all follow-up visits. This is important. ?Contact a health care provider if: ?You have a fever. ?You have pain that is not relieved by medicine. ?Get help right away if: ?You have chest pain. ?You have trouble  breathing. ?You have trouble swallowing. ?You vomit blood. ?You have black, tarry, or bloody stools. ?These symptoms may represent a serious problem that is an emergency. Do not wait to see if the symptoms will g

## 2022-04-18 ENCOUNTER — Encounter (HOSPITAL_COMMUNITY)
Admission: RE | Admit: 2022-04-18 | Discharge: 2022-04-18 | Disposition: A | Discharge status: Home / Self Care | Payer: Medicare Other | Source: Ambulatory Visit | Attending: Internal Medicine | Admitting: Internal Medicine

## 2022-04-18 ENCOUNTER — Ambulatory Visit: Payer: Medicare Other | Admitting: Pulmonary Disease

## 2022-04-18 ENCOUNTER — Encounter: Payer: Self-pay | Admitting: Pulmonary Disease

## 2022-04-18 ENCOUNTER — Encounter (HOSPITAL_COMMUNITY): Payer: Self-pay

## 2022-04-18 VITALS — BP 132/76 | HR 60 | Temp 98.4°F | Ht 60.0 in | Wt 179.4 lb

## 2022-04-18 DIAGNOSIS — R0609 Other forms of dyspnea: Secondary | ICD-10-CM

## 2022-04-18 DIAGNOSIS — R1319 Other dysphagia: Secondary | ICD-10-CM

## 2022-04-18 DIAGNOSIS — K219 Gastro-esophageal reflux disease without esophagitis: Secondary | ICD-10-CM

## 2022-04-18 DIAGNOSIS — G4733 Obstructive sleep apnea (adult) (pediatric): Secondary | ICD-10-CM | POA: Diagnosis not present

## 2022-04-18 DIAGNOSIS — J9611 Chronic respiratory failure with hypoxia: Secondary | ICD-10-CM

## 2022-04-18 DIAGNOSIS — J849 Interstitial pulmonary disease, unspecified: Secondary | ICD-10-CM | POA: Diagnosis not present

## 2022-04-18 DIAGNOSIS — Z01812 Encounter for preprocedural laboratory examination: Secondary | ICD-10-CM | POA: Insufficient documentation

## 2022-04-18 LAB — BASIC METABOLIC PANEL
Anion gap: 6 (ref 5–15)
BUN: 14 mg/dL (ref 8–23)
CO2: 28 mmol/L (ref 22–32)
Calcium: 9.7 mg/dL (ref 8.9–10.3)
Chloride: 108 mmol/L (ref 98–111)
Creatinine, Ser: 0.64 mg/dL (ref 0.44–1.00)
GFR, Estimated: >60 mL/min (ref >60)
Glucose, Bld: 150 mg/dL — ABNORMAL HIGH (ref 70–99)
Potassium: 4.1 mmol/L (ref 3.5–5.1)
Sodium: 142 mmol/L (ref 135–145)

## 2022-04-18 NOTE — Progress Notes (Signed)
? ?[facility] ? ?Chief Complaint  ?Patient presents with  ? Follow-up  ?  Cpap mask is bothering patient   ? ? ? ?Constitutional:  ?BP 132/76 (BP Location: Left Arm, Patient Position: Sitting)   Pulse 60   Temp 98.4 ?F (36.9 ?C) (Temporal)   Ht 5' (1.524 m)   Wt 179 lb 6.4 oz (81.4 kg)   SpO2 96% Comment: ra  BMI 35.04 kg/m?  ? ?Past Medical History:  ?CAD s/p stent, HTN, HLD, OA, GERD, Depression, Anxiety, Back pain, HH, LBBB, Macular degeneration, Scoliosis, DM type 2 ? ?Past Surgical History:  ?Her  has a past surgical history that includes Lipoma excision (Right, ~ 2015); Diagnostic laparoscopy (Right); Pars plana vitrectomy w/ repair of macular hole (Right); LEFT HEART CATH AND CORONARY ANGIOGRAPHY (N/A, 09/18/2017); INTRAVASCULAR PRESSURE WIRE/FFR STUDY (N/A, 09/18/2017); CORONARY STENT INTERVENTION (N/A, 09/18/2017); Eye surgery; Knee arthroscopy (Right); Cataract extraction, bilateral (Bilateral, 2017); Vaginal hysterectomy; Dilation and curettage of uterus; Cardiac catheterization (3151,7616; 2009); Colonoscopy with propofol (N/A, 11/09/2018); polypectomy (11/09/2018); transthoracic echocardiogram (08/04/2021); and NM MYOVIEW LTD (08/04/2021). ? ?Brief Summary:  ?ARSEMA TUSING is a 76 y.o. female former smoker with dyspnea after COVID 19 pneumonia in January 2021, and obstructive sleep apnea. ?  ? ? ? ?Subjective:  ? ?She is here with her daughter. ? ?She is having trouble with mask fit.  She got fitted for a mask with a magnet and this worked well.  For some reason this isn't the mask she received.  She is having pressure with contact points from her mask. ? ?She gets winded easily.  Recovers after resting for a few minutes.  Isn't very active at home. ? ?Had pneumonia last month.  Was told her chest xray was okay.  She was treated with prednisone and doxycycline twice.   ? ?She has more trouble with pollen.  This causes chest congestion.   ? ?Ambulatory  oximetry today >> SpO2 low 91% after walking 3 laps. ? ?Physical Exam:  ? ?Appearance - well kempt  ? ?ENMT - no sinus tenderness, no oral exudate, no LAN, Mallampati 3 airway, no stridor ? ?Respiratory - equal breath sounds bilaterally, no wheezing or rales ? ?CV - s1s2 regular rate and rhythm, no murmurs ? ?Ext - no clubbing, no edema, missing several digits on both hands ? ?Skin - no rashes ? ?Psych - normal mood and affect  ?Pulmonary testing:  ?PFT 05/22/20 >> FEV1 1.44 (79%), FEV1% 88, TLC 3.36 (75%), DLCO 76% ?PFT 05/28/21 >> FEV1 1.48 (83%), FEV1% 83, TLC 3.80 (85%), DLCO 106% ? ?Chest Imaging:  ?CT angio chest 12/29/19 >> multifocal GGO ?HRCT chest 06/15/20 >> atherosclerosis, prominent patchy peribronchovascular and subpleural reticulation, minimal GGO, mild/mod traction BTX ?HRCT chest 06/17/21 >> patchy areas of ground-glass attenuation, septal thickening, thickening of the peribronchovascular interstitium, scattere areas of mild subpleural reticulation and regional areas of architectural distortion, moderate air trapping ? ?Sleep Tests:  ?HST 08/26/20 >> AHI 7, SpO2 low 81%.  Spent 354.2 min with SpO2 < 89%. ?CPAP titration 10/06/20 >> CPAP 9 cm H2O >> AHI 5.6, didn't need supplemental oxygen. ?ONO with CPAP 11/25/21 >> test time 8 hrs 32 min.  Baseline SpO2 90%, low SpO2 83%.  Spent 2 hrs 19 min with SpO2 < 88%. ?CPAP 04/03/22 to 04/14/22 >> used on 10 of 11 nights with average 10 hrs 16 min.  Average AHI 2.4 with CPAP 9 cm H2O ? ?Cardiac Tests:  ?Echo 08/12/21 >>  EF 55 to 60% ? ?Social History:  ?She  reports that she quit smoking about 46 years ago. Her smoking use included cigarettes. She has a 25.50 pack-year smoking history. She has been exposed to tobacco smoke. She has never used smokeless tobacco. She reports that she does not drink alcohol and does not use drugs. ? ?Family History:  ?Her family history includes Breast cancer in her maternal aunt and paternal aunt; CAD in her mother. ?   ? ? ?Assessment/Plan:  ? ?ILD after COVID 19 infection in January 2021. ?- continue clinical monitoring ?- plan for follow up CT chest in 2024 ? ?Asthmatic bronchitis. ?- stiolto caused chest discomfort ?- continue spiriva respimat  ?- prn albuterol ?- discussed different roles for her inhalers ?- if symptoms progress, then she might need to add back an ICS or add montelukast ? ?Chronic respiratory failure with hypoxia. ?- continue 2 liters oxygen at night with CPAP ? ?Obstructive sleep apnea. ?- she is compliant with CPAP and reports benefit from therapy ?- she uses [employer] for her DME ?- continue CPAP 9 cm H2O ?- will arrange for CPAP mask refitting ? ?Dyspnea on exertion. ?- suspect most of this is related to obesity and deconditioning ?- she is to start a gradual exercise regimen ?- don't think needs additional pulmonary testing at this time ? ?Coronary artery disease. ?- followed by Dr. Glenetta Hew with [facility] ?- advised her to inform Dr. Ellyn Hack of the issues she was having with pravastatin use ? ?GERD, Esophageal dysphagia. ?- followed by Dr. Hildred Laser with Gastroenterology ? ?Time Spent Involved in Patient Care on Day of Examination:  ?37 minutes ? ?Follow up:  ? ?Patient Instructions  ?Will arrange for refitting of your CPAP mask ? ?Call if your breathing gets worse and you want to try a different inhaler ? ?Start a gradual exercise regimen ? ?Follow up in 6 months ? ?Medication List:  ? ?Allergies as of 04/18/2022   ? ?   Reactions  ? Adhesive [tape] Other (See Comments)  ? REACTION: redness/irritation at application site. **Certain bandages/adhesives cause this reaction**  ? Avelox [moxifloxacin] Anaphylaxis  ? Bactrim [sulfamethoxazole-trimethoprim] Anaphylaxis, Shortness Of Breath, Rash, Other (See Comments)  ? REACTION: Choking, inability to swallow, redness  ? Levaquin [levofloxacin] Anaphylaxis, Shortness Of Breath, Rash, Other (See Comments)  ? Reaction:Choking ?Brand name Levaquin ok  per pt  ? Mucinex [guaifenesin Er] Anaphylaxis, Shortness Of Breath, Rash  ? Peanut-containing Drug Products Anaphylaxis  ? Penicillins Other (See Comments)  ? "Passed out" ?Has patient had a PCN reaction causing immediate rash, facial/tongue/throat swelling, SOB or lightheadedness with hypotension: Unknown ?Has patient had a PCN reaction causing severe rash involving mucus membranes or skin necrosis: No ?Has patient had a PCN reaction that required hospitalization: No ?Has patient had a PCN reaction occurring within the last 10 years: No ?If all of the above answers are "NO", then may proceed with Cephalosporin use.  ? Sudafed [pseudoephedrine Hcl] Shortness Of Breath, Rash, Other (See Comments)  ? REACTION: Choking, redness, inability to swallow  ? Crestor [rosuvastatin] Other (See Comments)  ? Leg pain  ? Lipitor [atorvastatin] Other (See Comments)  ? Leg pain  ? Vytorin [ezetimibe-simvastatin] Other (See Comments)  ? Leg pain  ? Fish Allergy Nausea And Vomiting  ? Fish Oil Nausea And Vomiting  ? Norvasc [amlodipine] Other (See Comments)  ? unknown  ? Shrimp [shellfish Allergy] Nausea And Vomiting  ? ?  ? ?  ?Medication  List  ?  ? ?  ? Accurate as of April 18, 2022  1:52 PM. If you have any questions, ask your nurse or doctor.  ?  ?  ? ?  ? ?albuterol 108 (90 Base) MCG/ACT inhaler ?Commonly known as: VENTOLIN HFA ?Inhale 2 puffs into the lungs every 6 (six) hours as needed for wheezing or shortness of breath. ?  ?albuterol (2.5 MG/3ML) 0.083% nebulizer solution ?Commonly known as: PROVENTIL ?Take 3 mLs (2.5 mg total) by nebulization every 6 (six) hours as needed for wheezing or shortness of breath. ?  ?ALPRAZolam 0.5 MG tablet ?Commonly known as: Duanne Moron ?Take 0.25 mg by mouth 2 (two) times daily. ?  ?bisoprolol 5 MG tablet ?Commonly known as: ZEBETA ?Take 1 tablet (5 mg total) by mouth every evening. ?  ?busPIRone 15 MG tablet ?Commonly known as: BUSPAR ?Take 15 mg by mouth 2 (two) times daily. ?  ?CALCIUM 600 + D  PO ?Take 1 tablet by mouth daily. ?  ?CoQ10 100 MG Caps ?Take 100 mg by mouth every morning. ?  ?CRANBERRY PO ?Take 1 capsule by mouth daily. ?  ?escitalopram 20 MG tablet ?Commonly known as: LEXAPRO ?Take 20 mg by mouth ev

## 2022-04-18 NOTE — Patient Instructions (Signed)
Will arrange for refitting of your CPAP mask ? ?Call if your breathing gets worse and you want to try a different inhaler ? ?Start a gradual exercise regimen ? ?Follow up in 6 months ?

## 2022-04-20 ENCOUNTER — Ambulatory Visit (HOSPITAL_COMMUNITY): Payer: Medicare Other | Admitting: Anesthesiology

## 2022-04-20 ENCOUNTER — Encounter (HOSPITAL_COMMUNITY)
Admission: RE | Disposition: A | Discharge status: Home / Self Care | Payer: Self-pay | Source: Home / Self Care | Attending: Internal Medicine

## 2022-04-20 ENCOUNTER — Ambulatory Visit (HOSPITAL_COMMUNITY)
Admission: RE | Admit: 2022-04-20 | Discharge: 2022-04-20 | Disposition: A | Discharge status: Home / Self Care | Payer: Medicare Other | Attending: Internal Medicine | Admitting: Internal Medicine

## 2022-04-20 ENCOUNTER — Encounter (HOSPITAL_COMMUNITY): Payer: Self-pay | Admitting: Internal Medicine

## 2022-04-20 ENCOUNTER — Ambulatory Visit (HOSPITAL_BASED_OUTPATIENT_CLINIC_OR_DEPARTMENT_OTHER): Payer: Medicare Other | Admitting: Anesthesiology

## 2022-04-20 DIAGNOSIS — I2510 Atherosclerotic heart disease of native coronary artery without angina pectoris: Secondary | ICD-10-CM | POA: Diagnosis not present

## 2022-04-20 DIAGNOSIS — R1319 Other dysphagia: Secondary | ICD-10-CM

## 2022-04-20 DIAGNOSIS — E119 Type 2 diabetes mellitus without complications: Secondary | ICD-10-CM | POA: Diagnosis not present

## 2022-04-20 DIAGNOSIS — K2289 Other specified disease of esophagus: Secondary | ICD-10-CM

## 2022-04-20 DIAGNOSIS — K449 Diaphragmatic hernia without obstruction or gangrene: Secondary | ICD-10-CM | POA: Insufficient documentation

## 2022-04-20 DIAGNOSIS — G4730 Sleep apnea, unspecified: Secondary | ICD-10-CM | POA: Diagnosis not present

## 2022-04-20 DIAGNOSIS — K3189 Other diseases of stomach and duodenum: Secondary | ICD-10-CM | POA: Insufficient documentation

## 2022-04-20 DIAGNOSIS — I10 Essential (primary) hypertension: Secondary | ICD-10-CM | POA: Diagnosis not present

## 2022-04-20 DIAGNOSIS — Z87891 Personal history of nicotine dependence: Secondary | ICD-10-CM | POA: Insufficient documentation

## 2022-04-20 DIAGNOSIS — J449 Chronic obstructive pulmonary disease, unspecified: Secondary | ICD-10-CM | POA: Diagnosis not present

## 2022-04-20 DIAGNOSIS — K219 Gastro-esophageal reflux disease without esophagitis: Secondary | ICD-10-CM | POA: Insufficient documentation

## 2022-04-20 DIAGNOSIS — K317 Polyp of stomach and duodenum: Secondary | ICD-10-CM | POA: Insufficient documentation

## 2022-04-20 DIAGNOSIS — R1314 Dysphagia, pharyngoesophageal phase: Secondary | ICD-10-CM | POA: Insufficient documentation

## 2022-04-20 DIAGNOSIS — I252 Old myocardial infarction: Secondary | ICD-10-CM | POA: Diagnosis not present

## 2022-04-20 DIAGNOSIS — Z955 Presence of coronary angioplasty implant and graft: Secondary | ICD-10-CM | POA: Insufficient documentation

## 2022-04-20 DIAGNOSIS — Z7984 Long term (current) use of oral hypoglycemic drugs: Secondary | ICD-10-CM | POA: Insufficient documentation

## 2022-04-20 DIAGNOSIS — K2950 Unspecified chronic gastritis without bleeding: Secondary | ICD-10-CM | POA: Diagnosis not present

## 2022-04-20 DIAGNOSIS — K319 Disease of stomach and duodenum, unspecified: Secondary | ICD-10-CM | POA: Diagnosis not present

## 2022-04-20 HISTORY — PX: ESOPHAGEAL DILATION: SHX303

## 2022-04-20 HISTORY — PX: BIOPSY: SHX5522

## 2022-04-20 HISTORY — PX: ESOPHAGOGASTRODUODENOSCOPY (EGD) WITH PROPOFOL: SHX5813

## 2022-04-20 HISTORY — PX: POLYPECTOMY: SHX5525

## 2022-04-20 LAB — GLUCOSE, CAPILLARY
Glucose-Capillary: 82 mg/dL (ref 70–99)
Glucose-Capillary: 78 mg/dL (ref 70–99)

## 2022-04-20 SURGERY — ESOPHAGOGASTRODUODENOSCOPY (EGD) WITH PROPOFOL
Anesthesia: General

## 2022-04-20 MED ORDER — PROPOFOL 10 MG/ML IV BOLUS
INTRAVENOUS | Status: DC | PRN
  Administered 2022-04-20 (×4): 40 mg via INTRAVENOUS

## 2022-04-20 MED ORDER — LACTATED RINGERS IV SOLN
INTRAVENOUS | Status: DC
  Administered 2022-04-20: 1000 mL via INTRAVENOUS

## 2022-04-20 NOTE — Anesthesia Preprocedure Evaluation (Signed)
Anesthesia Evaluation  ?Patient identified by MRN, date of birth, ID band ?Patient awake ? ? ? ?Reviewed: ?Allergy & Precautions, NPO status , Patient's Chart, lab work & pertinent test results, reviewed documented beta blocker date and time  ? ?Airway ?Mallampati: II ? ?TM Distance: >3 FB ?Neck ROM: Full ? ? ? Dental ? ?(+) Dental Advisory Given, Missing ?  ?Pulmonary ?shortness of breath and with exertion, sleep apnea and Continuous Positive Airway Pressure Ventilation , pneumonia, COPD, former smoker,  ?  ?Pulmonary exam normal ?breath sounds clear to auscultation ? ? ? ? ? ? Cardiovascular ?hypertension, Pt. on home beta blockers and Pt. on medications ?+ angina + CAD, + Past MI, + Cardiac Stents and + DOE  ?+ dysrhythmias Atrial Fibrillation  ?Rhythm:Regular Rate:Normal ? ? ?  ?Neuro/Psych ?PSYCHIATRIC DISORDERS Anxiety Depression  Neuromuscular disease   ? GI/Hepatic ?Neg liver ROS, hiatal hernia, GERD  ,  ?Endo/Other  ?diabetes, Well Controlled, Type 2, Oral Hypoglycemic AgentsHypothyroidism  ? Renal/GU ?negative Renal ROS  ?negative genitourinary ?  ?Musculoskeletal ?negative musculoskeletal ROS ?(+)  ? Abdominal ?  ?Peds ?negative pediatric ROS ?(+)  Hematology ?negative hematology ROS ?(+)   ?Anesthesia Other Findings ? ? Reproductive/Obstetrics ?negative OB ROS ? ?  ? ? ? ? ? ? ? ? ? ? ? ? ? ?  ?  ? ? ? ? ? ? ? ? ?Anesthesia Physical ?Anesthesia Plan ? ?ASA: 3 ? ?Anesthesia Plan: General  ? ?Post-op Pain Management: Minimal or no pain anticipated  ? ?Induction: Intravenous ? ?PONV Risk Score and Plan: TIVA ? ?Airway Management Planned: Nasal Cannula and Natural Airway ? ?Additional Equipment:  ? ?Intra-op Plan:  ? ?Post-operative Plan:  ? ?Informed Consent: I have reviewed the patients History and Physical, chart, labs and discussed the procedure including the risks, benefits and alternatives for the proposed anesthesia with the patient or authorized representative who  has indicated his/her understanding and acceptance.  ? ? ? ?Dental advisory given ? ?Plan Discussed with: CRNA and Surgeon ? ?Anesthesia Plan Comments:   ? ? ? ? ? ? ?Anesthesia Quick Evaluation ? ?

## 2022-04-20 NOTE — Transfer of Care (Signed)
Immediate Anesthesia Transfer of Care Note ? ?Patient: Vickie [name] ? ?Procedure(s) Performed: ESOPHAGOGASTRODUODENOSCOPY (EGD) WITH PROPOFOL ?ESOPHAGEAL DILATION ?BIOPSY ?POLYPECTOMY ? ?Patient Location: PACU ? ?Anesthesia Type:General ? ?Level of Consciousness: awake, alert , oriented and patient cooperative ? ?Airway & Oxygen Therapy: Patient Spontanous Breathing ? ?Post-op Assessment: Report given to RN, Post -op Vital signs reviewed and stable and Patient moving all extremities X 4 ? ?Post vital signs: Reviewed and stable ? ?Last Vitals:  ?Vitals Value Taken Time  ?BP    ?Temp    ?Pulse    ?Resp    ?SpO2    ? ? ?Last Pain:  ?Vitals:  ? 04/20/22 1532  ?TempSrc:   ?PainSc: 0-No pain  ?   ? ?Patients Stated Pain Goal: 3 (04/20/22 1351) ? ?Complications: No notable events documented. ?

## 2022-04-20 NOTE — Anesthesia Postprocedure Evaluation (Signed)
Anesthesia Post Note ? ?Patient: Vickie [name] ? ?Procedure(s) Performed: ESOPHAGOGASTRODUODENOSCOPY (EGD) WITH PROPOFOL ?ESOPHAGEAL DILATION ?BIOPSY ?POLYPECTOMY ? ?Patient location during evaluation: PACU ?Anesthesia Type: General ?Level of consciousness: awake and alert and oriented ?Pain management: pain level controlled ?Vital Signs Assessment: post-procedure vital signs reviewed and stable ?Respiratory status: spontaneous breathing, nonlabored ventilation and respiratory function stable ?Cardiovascular status: blood pressure returned to baseline and stable ?Postop Assessment: no apparent nausea or vomiting ?Anesthetic complications: no ? ? ?No notable events documented. ? ? ?Last Vitals:  ?Vitals:  ? 04/20/22 1351 04/20/22 1555  ?BP: (!) 158/77 116/78  ?Pulse: (!) 53 (!) 48  ?Resp: 15 16  ?Temp: 36.7 ?C 36.7 ?C  ?SpO2: 98% 93%  ?  ?Last Pain:  ?Vitals:  ? 04/20/22 1555  ?TempSrc: Oral  ?PainSc: 0-No pain  ? ? ?  ?  ?  ?  ?  ?  ? ?Ayaan Shutes C Eunique Balik ? ? ? ? ?

## 2022-04-20 NOTE — H&P (Signed)
Vickie [name] is an 76 y.o. female.   ?Chief Complaint: Patient is here for esophagogastroduodenoscopy with esophageal dilation. ?HPI: Patient is 76 year old Caucasian female was history of GERD and Schatzki's ring which was last dilated in 1999 who presents with few year history of dysphagia to solids.  It has been intermittent but lately occurring frequently.  She is having couple of episodes of food impaction relieved with regurgitation.  She has had 2 episodes since she was seen in the office on 03/29/2022.  Heartburn is well controlled.  She denies nausea vomiting hematemesis or epigastric pain.  She did give history of tarry stools.  She was given Hemoccult card which she has not returned yet.  Her appetite is good and her weight is stable. ? ?Past Medical History:  ?Diagnosis Date  ? Anginal pain ([place])   ? Anxiety   ? Anxiety disorder   ? With apparent panic attacks  ? Arthritis   ? "hands, knees, back" (09/18/2017)  ? CAD S/P percutaneous coronary angioplasty 08/2017  ? Single-vessel CAD involving second diagonal branch treated with DES stent Synergy 2.25 mm x 12 mm (2.4 mm)  ? Chronic back pain   ? "all over" (09/18/2017)  ? COPD (chronic obstructive pulmonary disease) ([place])   ? PFTs were done in 2008 at [facility]  ? Depression   ? Dyspnea   ? Dysrhythmia   ? LBBB  ? Essential hypertension   ? GERD (gastroesophageal reflux disease)   ? Hiatal hernia   ? History of left bundle branch block (LBBB) 08/2017  ? Rate Related LBBB noted in cath lab  ? Hyperlipidemia   ? Macular degeneration   ? right eye  ? Myocardial infarction [facility]) 2007  ? "medically induced"  ? Nonocclusive coronary atherosclerosis of native coronary artery 2006 through 2012  ? multi caths 2012, 2006, [id number] (2683 complicated by catheter-induced dissection of small nondominant RCA, patent in 2009 with no residual abnormality); Myoview August 2013: LOW RISK, normal EF. ; Echocardiogram [name] - January 2014) moderate LVH, EF 55-65%. No  significant valvular disease.  ? OSA (obstructive sleep apnea) 9/25/ 2012  ? tested 2009; tetested sleep study 07/2011--titration 09/20/2011 now use Bi-PAP  ? OSA on CPAP   ? Pneumonia   ? "several times in 2017; 3 times already this year" (09/18/2017)  ? Scoliosis   ? Type II diabetes mellitus ([place])   ? ? ?Past Surgical History:  ?Procedure Laterality Date  ? CARDIAC CATHETERIZATION  4196,2229; 2009  ? Non-occlusice CAD - only 80% ostial SP1;  NON DOMINANNT  RCA (catheter insuced dissection with MI in 2007 --> resolved by 2009 cath)  ? CATARACT EXTRACTION, BILATERAL Bilateral 2017  ? Toric lens "in the left eye only"  ? COLONOSCOPY WITH PROPOFOL N/A 11/09/2018  ? Procedure: COLONOSCOPY WITH PROPOFOL;  Surgeon: Rogene Houston, MD;  [facility];  Service: Endoscopy;  Laterality: N/A;  2:25  ? CORONARY ANGIOPLASTY    ? x 1 stent  ? CORONARY STENT INTERVENTION N/A 09/18/2017  ? Procedure: CORONARY STENT INTERVENTION;  Surgeon: Leonie Man, MD;  Location: [facility] INVASIVE CV LAB: DES PCI Diag2: Synergy DES 2.25 mm x 12 mm (2.4 mm)  ? DIAGNOSTIC LAPAROSCOPY Right   ? DILATION AND CURETTAGE OF UTERUS    ? EYE SURGERY    ? INTRAVASCULAR PRESSURE WIRE/FFR STUDY N/A 09/18/2017  ? Procedure: INTRAVASCULAR PRESSURE WIRE/FFR STUDY;  Surgeon: Leonie Man, MD;  Location: [facility] CV LAB;  Service: Cardiovascular: FFR Diag 2 -- 0.78 (significcant) --> PCI  ? KNEE ARTHROSCOPY Right   ? LEFT HEART CATH AND CORONARY ANGIOGRAPHY N/A 09/18/2017  ? Procedure: LEFT HEART CATH AND CORONARY ANGIOGRAPHY;  Surgeon: Leonie Man, MD;  Location: [facility] INVASIVE CV LAB: Cul[place] - 80% Diag2 (FFR 0.78)- PCI.  pLAD 40%, pCx 40%. Post PCI LBBB. LVEDP - ~20 mmHg. EF 55-60%  ? LIPOMA EXCISION Right ~ 2015  ? anterior abdomen  ? NM MYOVIEW LTD  08/04/2021  ? LOW RISK. EF 55-60%. Mild fixed apical defect - /w breast attenuation.  ? PARS PLANA VITRECTOMY W/ REPAIR OF MACULAR HOLE Right   ? Unsuccessful repair.  Hole filled  ?  POLYPECTOMY  11/09/2018  ? Procedure: POLYPECTOMY;  Surgeon: Rogene Houston, MD;  Location: [facility];  Service: Endoscopy;;  colon  ? TRANSTHORACIC ECHOCARDIOGRAM  08/04/2021  ? Normal LVEF 55-60%.  Septal movement consistent with LBBB now present.  Unable to assess diastolic function.  Normal RV size and function.  Unable assess PAP.  Normal aortic and mitral valves.  Normal RAP.  ? VAGINAL HYSTERECTOMY    ? ? ?Family History  ?Problem Relation Age of Onset  ? CAD Mother   ? Breast cancer Maternal Aunt   ? Breast cancer Paternal Aunt   ? ?Social History:  reports that she quit smoking about 46 years ago. Her smoking use included cigarettes. She has a 25.50 pack-year smoking history. She has been exposed to tobacco smoke. She has never used smokeless tobacco. She reports that she does not drink alcohol and does not use drugs. ? ?Allergies:  ?Allergies  ?Allergen Reactions  ? Adhesive [Tape] Other (See Comments)  ?  REACTION: redness/irritation at application site. **Certain bandages/adhesives cause this reaction**  ? Avelox [Moxifloxacin] Anaphylaxis  ? Bactrim [Sulfamethoxazole-Trimethoprim] Anaphylaxis, Shortness Of Breath, Rash and Other (See Comments)  ?  REACTION: Choking, inability to swallow, redness  ? Levaquin [Levofloxacin] Anaphylaxis, Shortness Of Breath, Rash and Other (See Comments)  ?  Reaction:Choking ?Brand name Levaquin ok per pt  ? Mucinex [Guaifenesin Er] Anaphylaxis, Shortness Of Breath and Rash  ? Peanut-Containing Drug Products Anaphylaxis  ? Penicillins Other (See Comments)  ?  "Passed out" ?Has patient had a PCN reaction causing immediate rash, facial/tongue/throat swelling, SOB or lightheadedness with hypotension: Unknown ?Has patient had a PCN reaction causing severe rash involving mucus membranes or skin necrosis: No ?Has patient had a PCN reaction that required hospitalization: No ?Has patient had a PCN reaction occurring within the last 10 years: No ?If all of the above answers  are "NO", then may proceed with Cephalosporin use. ?  ? Sudafed [Pseudoephedrine Hcl] Shortness Of Breath, Rash and Other (See Comments)  ?  REACTION: Choking, redness, inability to swallow  ? Crestor [Rosuvastatin] Other (See Comments)  ?  Leg pain  ? Lipitor [Atorvastatin] Other (See Comments)  ?  Leg pain  ? Vytorin [Ezetimibe-Simvastatin] Other (See Comments)  ?  Leg pain  ? Fish Allergy Nausea And Vomiting  ? Fish Oil Nausea And Vomiting  ? Norvasc [Amlodipine] Other (See Comments)  ?  unknown  ? Shrimp [Shellfish Allergy] Nausea And Vomiting  ? ? ?Medications Prior to Admission  ?Medication Sig Dispense Refill  ? albuterol (PROVENTIL) (2.5 MG/3ML) 0.083% nebulizer solution Take 3 mLs (2.5 mg total) by nebulization every 6 (six) hours as needed for wheezing or shortness of breath. 360 mL 5  ? albuterol (VENTOLIN HFA) 108 (90 Base) MCG/ACT inhaler Inhale  2 puffs into the lungs every 6 (six) hours as needed for wheezing or shortness of breath. 8 g 6  ? ALPRAZolam (XANAX) 0.5 MG tablet Take 0.25 mg by mouth 2 (two) times daily.    ? Alum Hydroxide-Mag Trisilicate (GAVISCON) 67-34.1 MG CHEW Chew 2 tablets by mouth at bedtime.    ? Biotin w/ Vitamins C & E (HAIR/SKIN/NAILS PO) Take 1 tablet by mouth at bedtime.    ? bisoprolol (ZEBETA) 5 MG tablet Take 1 tablet (5 mg total) by mouth every evening. 90 tablet 3  ? busPIRone (BUSPAR) 15 MG tablet Take 15 mg by mouth 2 (two) times daily.    ? Calcium Carb-Cholecalciferol (CALCIUM 600 + D PO) Take 1 tablet by mouth daily.     ? Coenzyme Q10 (COQ10) 100 MG CAPS Take 100 mg by mouth every morning.    ? CRANBERRY PO Take 1 capsule by mouth daily.     ? escitalopram (LEXAPRO) 20 MG tablet Take 20 mg by mouth every morning.     ? esomeprazole (NEXIUM) 20 MG capsule Take 20 mg by mouth daily.    ? Evolocumab (REPATHA SURECLICK) 937 MG/ML SOAJ Inject 140 mg into the skin every 14 (fourteen) days. 2 mL 11  ? furosemide (LASIX) 20 MG tablet TAKE ONE TABLET EVERY OTHER DAY. 45  tablet 0  ? isosorbide mononitrate (IMDUR) 60 MG 24 hr tablet TAKE ONE TABLET BY MOUTH ONCE DAILY. 30 tablet 11  ? mirtazapine (REMERON) 15 MG tablet Take 7.5 mg by mouth at bedtime.     ? Multiple Vitamin (MULTI

## 2022-04-20 NOTE — Discharge Instructions (Addendum)
No aspirin or NSAIDs for 24 hours Resume usual medications and diet as before. No driving for 24 hours. Physician will call with biopsy results. 

## 2022-04-20 NOTE — Op Note (Signed)
[facility] ?Patient Name: Vickie [name] ?Procedure Date: 04/20/2022 3:16 PM ?MRN: [id number] ?Date of Birth: 1946/01/10 ?Attending MD: Hildred Laser , MD ?CSN: [id number] ?Age: 76 ?Admit Type: Outpatient ?Procedure:                Upper GI endoscopy ?Indications:              Esophageal dysphagia ?Providers:                Hildred Laser, MD, Janeece Riggers, RN, Tammy Vaught,  ?                          RN, Raphael Gibney, Technician ?Referring MD:             Sharilyn Sites, MD ?Medicines:                Propofol per Anesthesia ?Complications:            No immediate complications. ?Estimated Blood Loss:     Estimated blood loss was minimal. ?Procedure:                Pre-Anesthesia Assessment: ?                          - Prior to the procedure, a History and Physical  ?                          was performed, and patient medications and  ?                          allergies were reviewed. The patient's tolerance of  ?                          previous anesthesia was also reviewed. The risks  ?                          and benefits of the procedure and the sedation  ?                          options and risks were discussed with the patient.  ?                          All questions were answered, and informed consent  ?                          was obtained. Prior Anticoagulants: The patient has  ?                          taken no previous anticoagulant or antiplatelet  ?                          agents. ASA Grade Assessment: III - A patient with  ?                          severe systemic disease. After reviewing the risks  ?  and benefits, the patient was deemed in  ?                          satisfactory condition to undergo the procedure. ?                          After obtaining informed consent, the endoscope was  ?                          passed under direct vision. Throughout the  ?                          procedure, the patient's blood pressure, pulse, and  ?                           oxygen saturations were monitored continuously. The  ?                          GIF-H190 ([id number]) scope was introduced through the  ?                          mouth, and advanced to the second part of duodenum.  ?                          The upper GI endoscopy was accomplished without  ?                          difficulty. The patient tolerated the procedure  ?                          well. ?Scope In: 3:37:03 PM ?Scope Out: 3:48:14 PM ?Total Procedure Duration: 0 hours 11 minutes 11 seconds  ?Findings: ?     The hypopharynx was normal. ?     The examined esophagus was normal. ?     The Z-line was irregular and was found 38 cm from the incisors. ?     A 2 cm hiatal hernia was present. ?     No endoscopic abnormality was evident in the esophagus to explain the  ?     patient's complaint of dysphagia. It was decided, however, to proceed  ?     with dilation of the entire esophagus. The scope was withdrawn. Dilation  ?     was performed with a Maloney dilator with no resistance at 81 Fr. The  ?     dilation site was examined following endoscope reinsertion and showed no  ?     change and no bleeding, mucosal tear or perforation. ?     Two small sessile polyps with no stigmata of recent bleeding were found  ?     in the gastric body. Biopsies were taken with a cold forceps for  ?     histology. The pathology specimen was placed into Bottle Number 2. ?     Striped moderately erythematous mucosa without bleeding was found in the  ?     gastric body. Biopsies were taken with a cold forceps for histology. The  ?     pathology specimen was placed into Bottle  Number 1. ?     The exam of the stomach was otherwise normal. ?     The duodenal bulb and second portion of the duodenum were normal. ?Impression:               - Normal hypopharynx. ?                          - Normal esophagus. ?                          - Z-line irregular, 38 cm from the incisors. ?                          - 2 cm hiatal hernia. ?                           - No endoscopic esophageal abnormality to explain  ?                          patient's dysphagia. Esophagus dilated. Dilated. ?                          - Two gastric polyps. Biopsied. ?                          - Erythematous mucosa in the gastric body. Biopsied. ?                          - Normal duodenal bulb and second portion of the  ?                          duodenum. ?Moderate Sedation: ?     Per Anesthesia Care ?Recommendation:           - Patient has a contact number available for  ?                          emergencies. The signs and symptoms of potential  ?                          delayed complications were discussed with the  ?                          patient. Return to normal activities tomorrow.  ?                          Written discharge instructions were provided to the  ?                          patient. ?                          - Resume previous diet today. ?                          - Continue present medications. ?                          -  No aspirin, ibuprofen, naproxen, or other  ?                          non-steroidal anti-inflammatory drugs for 1 day. ?                          - Await pathology results. ?                          - Telephone GI clinic in 1 week. ?Procedure Code(s):        --- Professional --- ?                          [phone], Esophagogastroduodenoscopy, flexible,  ?                          transoral; with biopsy, single or multiple ?                          43450, Dilation of esophagus, by unguided sound or  ?                          bougie, single or multiple passes ?Diagnosis Code(s):        --- Professional --- ?                          K22.8, Other specified diseases of esophagus ?                          K44.9, Diaphragmatic hernia without obstruction or  ?                          gangrene ?                          K31.7, Polyp of stomach and duodenum ?                          K31.89, Other diseases of stomach and duodenum ?                           R13.14, Dysphagia, pharyngoesophageal phase ?CPT copyright 2019 [employer]. All rights reserved. ?The codes documented in this report are preliminary and upon coder review may  ?be revised to meet current compliance requirements. ?Hildred Laser, MD ?Hildred Laser, MD ?04/20/2022 3:57:53 PM ?This report has been signed electronically. ?Number of Addenda: 0 ?

## 2022-04-22 LAB — SURGICAL PATHOLOGY

## 2022-04-25 ENCOUNTER — Encounter (HOSPITAL_COMMUNITY): Payer: Self-pay | Admitting: Internal Medicine

## 2022-04-26 DIAGNOSIS — N182 Chronic kidney disease, stage 2 (mild): Secondary | ICD-10-CM | POA: Diagnosis not present

## 2022-04-26 DIAGNOSIS — Z6835 Body mass index (BMI) 35.0-35.9, adult: Secondary | ICD-10-CM | POA: Diagnosis not present

## 2022-04-26 DIAGNOSIS — E782 Mixed hyperlipidemia: Secondary | ICD-10-CM | POA: Diagnosis not present

## 2022-04-26 DIAGNOSIS — R7309 Other abnormal glucose: Secondary | ICD-10-CM | POA: Diagnosis not present

## 2022-04-26 DIAGNOSIS — I10 Essential (primary) hypertension: Secondary | ICD-10-CM | POA: Diagnosis not present

## 2022-04-26 DIAGNOSIS — E6609 Other obesity due to excess calories: Secondary | ICD-10-CM | POA: Diagnosis not present

## 2022-04-26 DIAGNOSIS — I2510 Atherosclerotic heart disease of native coronary artery without angina pectoris: Secondary | ICD-10-CM | POA: Diagnosis not present

## 2022-04-28 DIAGNOSIS — E1140 Type 2 diabetes mellitus with diabetic neuropathy, unspecified: Secondary | ICD-10-CM | POA: Diagnosis not present

## 2022-04-28 DIAGNOSIS — E1151 Type 2 diabetes mellitus with diabetic peripheral angiopathy without gangrene: Secondary | ICD-10-CM | POA: Diagnosis not present

## 2022-04-29 ENCOUNTER — Telehealth: Payer: Self-pay | Admitting: Physician Assistant

## 2022-04-29 NOTE — Telephone Encounter (Signed)
Recent blood work sent to PCP via epic. ?Thanks! ? ?

## 2022-04-29 NOTE — Telephone Encounter (Signed)
Patient is requesting that our office send a copy of her recent lab results to her PCP Dr. Sharilyn Sites at [facility] in [place]. Please fax results to [phone]. ? ? ?

## 2022-05-17 ENCOUNTER — Other Ambulatory Visit: Payer: Self-pay | Admitting: Cardiology

## 2022-05-25 ENCOUNTER — Telehealth: Payer: Self-pay

## 2022-05-25 DIAGNOSIS — M542 Cervicalgia: Secondary | ICD-10-CM | POA: Diagnosis not present

## 2022-05-25 DIAGNOSIS — Z6837 Body mass index (BMI) 37.0-37.9, adult: Secondary | ICD-10-CM | POA: Diagnosis not present

## 2022-05-25 NOTE — Telephone Encounter (Signed)
Pt and her daughter walked into the office today after being advised at neurology appointment that she should schedule an appointment with her cardiologist due to lower heart rates 40-50s, tightness in her chest, shortness of breath and having to use her oxygen more often. Explained to pt that these are serious issues and advised that she go to the ED to be evaluated. Pt refuses and asks for an appointment. Offered pt an appointment with DOD on Friday, pt and daughter are in agreement. Appointment to see DOD on Friday made for pt. Appointment reminder provided to pt. ED precautions provided to pt and daughter. They both verbalized understanding.

## 2022-05-26 ENCOUNTER — Other Ambulatory Visit: Payer: Self-pay | Admitting: Pulmonary Disease

## 2022-05-26 NOTE — Telephone Encounter (Signed)
Sometimes she just needs to be seen.  I suspect that may be she needs a monitor to determine her heart rates.  She is on a beta-blocker at low-dose.  May need to be reduced.  She has several reasons for dyspnea that her likely not cardiac in nature, and therefore in person evaluation if needed.  She has a DOD appointment on Friday, I am in the Cath Lab on Friday.Glenetta Hew, MD

## 2022-05-27 ENCOUNTER — Encounter: Payer: Self-pay | Admitting: Cardiology

## 2022-05-27 ENCOUNTER — Ambulatory Visit: Payer: Medicare Other | Admitting: Cardiology

## 2022-05-27 VITALS — BP 140/70 | HR 68 | Ht 60.0 in | Wt 185.8 lb

## 2022-05-27 DIAGNOSIS — Z9861 Coronary angioplasty status: Secondary | ICD-10-CM

## 2022-05-27 DIAGNOSIS — I447 Left bundle-branch block, unspecified: Secondary | ICD-10-CM

## 2022-05-27 DIAGNOSIS — I2510 Atherosclerotic heart disease of native coronary artery without angina pectoris: Secondary | ICD-10-CM

## 2022-05-27 DIAGNOSIS — R001 Bradycardia, unspecified: Secondary | ICD-10-CM

## 2022-05-27 DIAGNOSIS — R0609 Other forms of dyspnea: Secondary | ICD-10-CM | POA: Diagnosis not present
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
## 2022-05-27 NOTE — Patient Instructions (Addendum)
Medication Instructions:   Stop taking  Bisoprolol 5 mg  *If you need a refill on your cardiac medications before your next appointment, please call your pharmacy*   Lab Work:  Not needed   Testing/Procedures:  Not needed  Follow-Up: At [facility], you and your health needs are our priority.  As part of our continuing mission to provide you with exceptional heart care, we have created designated Provider Care Teams.  These Care Teams include your primary Cardiologist (physician) and Advanced Practice Providers (APPs -  Physician Assistants and Nurse Practitioners) who all work together to provide you with the care you need, when you need it.     Your next appointment:   Keep appointment with Dr Ellyn Hack  in Sept 18  The format for your next appointment:   In Person  Provider:   Glenetta Hew, MD    Other Instructions    Monitor heart rate  and blood pressure  ( notify office if top number of blood pressure stay s above 150/? Consistently and keep a record  bring to the next appointment.

## 2022-06-01 ENCOUNTER — Ambulatory Visit: Payer: Medicare Other | Admitting: Pulmonary Disease

## 2022-06-08 ENCOUNTER — Ambulatory Visit (HOSPITAL_COMMUNITY): Payer: Medicare Other | Attending: Neurological Surgery | Admitting: Physical Therapy

## 2022-06-08 ENCOUNTER — Encounter (HOSPITAL_COMMUNITY): Payer: Self-pay | Admitting: Physical Therapy

## 2022-06-08 DIAGNOSIS — R293 Abnormal posture: Secondary | ICD-10-CM | POA: Insufficient documentation

## 2022-06-08 DIAGNOSIS — M542 Cervicalgia: Secondary | ICD-10-CM | POA: Insufficient documentation
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
## 2022-06-13 DIAGNOSIS — Z961 Presence of intraocular lens: Secondary | ICD-10-CM | POA: Diagnosis not present

## 2022-06-13 DIAGNOSIS — H35341 Macular cyst, hole, or pseudohole, right eye: Secondary | ICD-10-CM | POA: Diagnosis not present

## 2022-06-13 DIAGNOSIS — E119 Type 2 diabetes mellitus without complications: Secondary | ICD-10-CM | POA: Diagnosis not present

## 2022-06-13 DIAGNOSIS — H524 Presbyopia: Secondary | ICD-10-CM | POA: Diagnosis not present

## 2022-06-13 DIAGNOSIS — M25511 Pain in right shoulder: Secondary | ICD-10-CM | POA: Diagnosis not present

## 2022-06-14 ENCOUNTER — Encounter (HOSPITAL_COMMUNITY): Payer: Self-pay

## 2022-06-14 ENCOUNTER — Ambulatory Visit (HOSPITAL_COMMUNITY): Payer: Medicare Other | Attending: Family Medicine

## 2022-06-14 DIAGNOSIS — M542 Cervicalgia: Secondary | ICD-10-CM | POA: Diagnosis not present

## 2022-06-14 DIAGNOSIS — R293 Abnormal posture: Secondary | ICD-10-CM | POA: Insufficient documentation
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
## 2022-06-20 ENCOUNTER — Ambulatory Visit (HOSPITAL_COMMUNITY): Payer: Medicare Other | Admitting: Physical Therapy

## 2022-06-20 ENCOUNTER — Telehealth (HOSPITAL_COMMUNITY): Payer: Self-pay | Admitting: Physical Therapy

## 2022-06-30 ENCOUNTER — Ambulatory Visit (HOSPITAL_COMMUNITY): Payer: Medicare Other | Attending: Neurological Surgery

## 2022-06-30 ENCOUNTER — Encounter (HOSPITAL_COMMUNITY): Payer: Self-pay

## 2022-06-30 DIAGNOSIS — R293 Abnormal posture: Secondary | ICD-10-CM | POA: Insufficient documentation

## 2022-06-30 DIAGNOSIS — M542 Cervicalgia: Secondary | ICD-10-CM | POA: Insufficient documentation

## 2022-06-30 DIAGNOSIS — F419 Anxiety disorder, unspecified: Secondary | ICD-10-CM | POA: Diagnosis not present

## 2022-06-30 DIAGNOSIS — J4530 Mild persistent asthma, uncomplicated: Secondary | ICD-10-CM | POA: Diagnosis not present

## 2022-06-30 DIAGNOSIS — E1140 Type 2 diabetes mellitus with diabetic neuropathy, unspecified: Secondary | ICD-10-CM | POA: Diagnosis not present

## 2022-06-30 DIAGNOSIS — E782 Mixed hyperlipidemia: Secondary | ICD-10-CM | POA: Diagnosis not present
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
## 2022-07-06 ENCOUNTER — Telehealth: Payer: Self-pay | Admitting: *Deleted

## 2022-07-06 ENCOUNTER — Ambulatory Visit (HOSPITAL_COMMUNITY): Payer: Medicare Other

## 2022-07-06 DIAGNOSIS — M542 Cervicalgia: Secondary | ICD-10-CM | POA: Diagnosis not present

## 2022-07-06 DIAGNOSIS — R293 Abnormal posture: Secondary | ICD-10-CM | POA: Diagnosis not present
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
## 2022-07-06 NOTE — Chronic Care Management (AMB) (Signed)
  Care Coordination  Note  07/06/2022 Name: [name] MRN: [id number] DOB: [birth year]  Vickie [name] is a 76 y.o. year old female who is a primary care patient of Sharilyn Sites, MD. I reached out to [name] by phone today to offer care coordination services.       Follow up plan: Unsuccessful telephone outreach attempt made. A HIPAA compliant phone message was left for the patient providing contact information and requesting a return call.  The care guide will reach out to the patient again over the next 7 days.  If patient calls provider office to request assistance with care coordination needs, please contact the care guide at the number below.   [place]  Direct Dial: [phone]

## 2022-07-06 NOTE — Chronic Care Management (AMB) (Signed)
  Care Coordination  Note  07/06/2022 Name: Vickie [name] MRN: [id number] DOB: 1946/06/17  Vickie [name] is a 76 y.o. year old female who is a primary care patient of Sharilyn Sites, MD. I reached out to [name] by phone today to offer care coordination services.      [name] was given information about Care Coordination services today including:  The Care Coordination services include support from the care team which includes your Nurse Coordinator, Clinical Social Worker, or Pharmacist.  The Care Coordination team is here to help remove barriers to the health concerns and goals most important to you. Care Coordination services are voluntary and the patient may decline or stop services at any time by request to their care team member.   Patient agreed to services and verbal consent obtained.   Follow up plan: Telephone appointment with care coordination team member scheduled for:07/08/22  [place]  Direct Dial: [phone]

## 2022-07-07 DIAGNOSIS — E1151 Type 2 diabetes mellitus with diabetic peripheral angiopathy without gangrene: Secondary | ICD-10-CM | POA: Diagnosis not present

## 2022-07-07 DIAGNOSIS — E1140 Type 2 diabetes mellitus with diabetic neuropathy, unspecified: Secondary | ICD-10-CM | POA: Diagnosis not present

## 2022-07-08 ENCOUNTER — Encounter (HOSPITAL_COMMUNITY): Payer: Self-pay

## 2022-07-08 ENCOUNTER — Ambulatory Visit (HOSPITAL_COMMUNITY): Payer: Medicare Other

## 2022-07-08 ENCOUNTER — Ambulatory Visit: Payer: Self-pay | Admitting: *Deleted

## 2022-07-08 DIAGNOSIS — R293 Abnormal posture: Secondary | ICD-10-CM

## 2022-07-08 DIAGNOSIS — M542 Cervicalgia: Secondary | ICD-10-CM

## 2022-07-08 NOTE — Patient Outreach (Signed)
  Care Coordination   Initial Visit Note   07/08/2022 Name: OSIRIS CHARLES MRN: [id number] DOB: Jun 24, 1946  MADELENE KAATZ is a 76 y.o. year old female who sees Sharilyn Sites, MD for primary care.  Unsuccessful outreach.    Follow up plan: Follow up call scheduled for TBD, Care Guide will reschedule as call was unsuccessful  Encounter Outcome:  No Answer
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
## 2022-07-11 ENCOUNTER — Encounter (HOSPITAL_COMMUNITY): Payer: Self-pay | Admitting: Physical Therapy

## 2022-07-11 ENCOUNTER — Ambulatory Visit (HOSPITAL_COMMUNITY): Payer: Medicare Other | Admitting: Physical Therapy

## 2022-07-11 DIAGNOSIS — M542 Cervicalgia: Secondary | ICD-10-CM | POA: Diagnosis not present

## 2022-07-11 DIAGNOSIS — R293 Abnormal posture: Secondary | ICD-10-CM
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
## 2022-07-14 ENCOUNTER — Ambulatory Visit (HOSPITAL_COMMUNITY): Payer: Medicare Other

## 2022-07-14 DIAGNOSIS — R293 Abnormal posture: Secondary | ICD-10-CM | POA: Diagnosis not present

## 2022-07-14 DIAGNOSIS — M542 Cervicalgia: Secondary | ICD-10-CM
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
## 2022-07-19 ENCOUNTER — Encounter (HOSPITAL_COMMUNITY): Payer: Self-pay

## 2022-07-19 ENCOUNTER — Ambulatory Visit (HOSPITAL_COMMUNITY): Payer: Medicare Other

## 2022-07-19 DIAGNOSIS — R293 Abnormal posture: Secondary | ICD-10-CM

## 2022-07-19 DIAGNOSIS — M542 Cervicalgia: Secondary | ICD-10-CM | POA: Diagnosis not present
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
## 2022-07-21 ENCOUNTER — Encounter (HOSPITAL_COMMUNITY): Payer: Self-pay | Admitting: Physical Therapy

## 2022-07-21 ENCOUNTER — Ambulatory Visit (HOSPITAL_COMMUNITY): Payer: Medicare Other | Admitting: Physical Therapy

## 2022-07-21 DIAGNOSIS — M542 Cervicalgia: Secondary | ICD-10-CM | POA: Diagnosis not present

## 2022-07-21 DIAGNOSIS — R293 Abnormal posture: Secondary | ICD-10-CM
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
## 2022-07-27 ENCOUNTER — Other Ambulatory Visit: Payer: Self-pay | Admitting: Family Medicine

## 2022-07-27 DIAGNOSIS — Z1231 Encounter for screening mammogram for malignant neoplasm of breast: Secondary | ICD-10-CM

## 2022-08-02 ENCOUNTER — Ambulatory Visit: Payer: Self-pay | Admitting: *Deleted

## 2022-08-02 ENCOUNTER — Encounter: Payer: Self-pay | Admitting: *Deleted
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
## 2022-08-02 NOTE — Patient Instructions (Signed)
Visit Information  Thank you for taking time to visit with me today. Please don't hesitate to contact me if I can be of assistance to you before our next scheduled telephone appointment.  Our next appointment is by telephone on 10/4  Patient verbalizes understanding of instructions and care plan provided today and agrees to view in [place]. Active MyChart status and patient understanding of how to access instructions and care plan via MyChart confirmed with patient.     The patient has been provided with contact information for the care management team and has been advised to call with any health related questions or concerns.   [address] RN, MSN, [place]  [phone]

## 2022-08-18 ENCOUNTER — Ambulatory Visit
Admission: RE | Admit: 2022-08-18 | Discharge: 2022-08-18 | Disposition: A | Discharge status: Home / Self Care | Payer: Medicare Other | Source: Ambulatory Visit | Attending: Family Medicine | Admitting: Family Medicine

## 2022-08-18 DIAGNOSIS — Z1231 Encounter for screening mammogram for malignant neoplasm of breast: Secondary | ICD-10-CM | POA: Diagnosis not present

## 2022-08-22 ENCOUNTER — Other Ambulatory Visit: Payer: Self-pay | Admitting: Family Medicine

## 2022-08-22 DIAGNOSIS — R928 Other abnormal and inconclusive findings on diagnostic imaging of breast: Secondary | ICD-10-CM

## 2022-08-31 ENCOUNTER — Other Ambulatory Visit: Payer: Self-pay | Admitting: Cardiology

## 2022-09-01 DIAGNOSIS — Z0001 Encounter for general adult medical examination with abnormal findings: Secondary | ICD-10-CM | POA: Diagnosis not present

## 2022-09-01 DIAGNOSIS — Z1331 Encounter for screening for depression: Secondary | ICD-10-CM | POA: Diagnosis not present

## 2022-09-01 DIAGNOSIS — E1140 Type 2 diabetes mellitus with diabetic neuropathy, unspecified: Secondary | ICD-10-CM | POA: Diagnosis not present

## 2022-09-01 DIAGNOSIS — Z6836 Body mass index (BMI) 36.0-36.9, adult: Secondary | ICD-10-CM | POA: Diagnosis not present

## 2022-09-02 ENCOUNTER — Ambulatory Visit
Admission: RE | Admit: 2022-09-02 | Discharge: 2022-09-02 | Disposition: A | Discharge status: Home / Self Care | Payer: Medicare Other | Source: Ambulatory Visit | Attending: Family Medicine | Admitting: Family Medicine

## 2022-09-02 DIAGNOSIS — R928 Other abnormal and inconclusive findings on diagnostic imaging of breast: Secondary | ICD-10-CM

## 2022-09-02 DIAGNOSIS — N6002 Solitary cyst of left breast: Secondary | ICD-10-CM | POA: Diagnosis not present

## 2022-09-12 ENCOUNTER — Encounter: Payer: Self-pay | Admitting: Cardiology

## 2022-09-12 ENCOUNTER — Ambulatory Visit: Payer: Medicare Other | Attending: Cardiology | Admitting: Cardiology

## 2022-09-12 ENCOUNTER — Other Ambulatory Visit: Payer: Self-pay | Admitting: Cardiology

## 2022-09-12 VITALS — BP 154/88 | HR 95 | Ht 60.0 in | Wt 182.6 lb

## 2022-09-12 DIAGNOSIS — I2510 Atherosclerotic heart disease of native coronary artery without angina pectoris: Secondary | ICD-10-CM

## 2022-09-12 DIAGNOSIS — R001 Bradycardia, unspecified: Secondary | ICD-10-CM

## 2022-09-12 DIAGNOSIS — I25119 Atherosclerotic heart disease of native coronary artery with unspecified angina pectoris: Secondary | ICD-10-CM | POA: Diagnosis not present

## 2022-09-12 DIAGNOSIS — I10 Essential (primary) hypertension: Secondary | ICD-10-CM

## 2022-09-12 DIAGNOSIS — E669 Obesity, unspecified: Secondary | ICD-10-CM

## 2022-09-12 DIAGNOSIS — Z9861 Coronary angioplasty status: Secondary | ICD-10-CM

## 2022-09-12 DIAGNOSIS — R0609 Other forms of dyspnea: Secondary | ICD-10-CM

## 2022-09-12 DIAGNOSIS — I447 Left bundle-branch block, unspecified: Secondary | ICD-10-CM

## 2022-09-12 MED ORDER — VALSARTAN 40 MG PO TABS: 40.0000 mg | ORAL_TABLET | Freq: Every day | ORAL | 3 refills | Status: DC

## 2022-09-12 MED ORDER — BISOPROLOL-HYDROCHLOROTHIAZIDE 2.5-6.25 MG PO TABS: 1.0000 | ORAL_TABLET | Freq: Every day | ORAL | 3 refills | Status: DC
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
## 2022-09-12 NOTE — Patient Instructions (Signed)
Medication Instructions:   start Bisoprolol /HCTZ 6.25 mg/12.5 mg  - for the first week take 1/2 tablet daily  in the morning --  if you heart rate stays above 70 then increase to  full tablet daily  in the morning.   Start valsartan 40 mg  daily   Keep blood pressure LOG   *If you need a refill on your cardiac medications before your next appointment, please call your pharmacy*   Lab Work:  Not needed   If you have labs (blood work) drawn today and your tests are completely normal, you will receive your results only by: [place] (if you have MyChart) OR A paper copy in the mail If you have any lab test that is abnormal or we need to change your treatment, we will call you to review the results.   Testing/Procedures:    Follow-Up: At [facility], you and your health needs are our priority.  As part of our continuing mission to provide you with exceptional heart care, we have created designated Provider Care Teams.  These Care Teams include your primary Cardiologist (physician) and Advanced Practice Providers (APPs -  Physician Assistants and Nurse Practitioners) who all work together to provide you with the care you need, when you need it.  We recommend signing up for the patient portal called "MyChart".  Sign up information is provided on this After Visit Summary.  MyChart is used to connect with patients for Virtual Visits (Telemedicine).  Patients are able to view lab/test results, encounter notes, upcoming appointments, etc.  Non-urgent messages can be sent to your provider as well.   To learn more about what you can do with MyChart, go to [email or web address].    Your next appointment:   3 month(s)  The format for your next appointment:   In Person  Provider:   Glenetta Hew, MD    Your physician recommends that you schedule a follow-up appointment in:  4-6 weeks with CVRR   Other Instructions

## 2022-09-15 DIAGNOSIS — E1140 Type 2 diabetes mellitus with diabetic neuropathy, unspecified: Secondary | ICD-10-CM | POA: Diagnosis not present

## 2022-09-15 DIAGNOSIS — E1151 Type 2 diabetes mellitus with diabetic peripheral angiopathy without gangrene: Secondary | ICD-10-CM | POA: Diagnosis not present

## 2022-09-19 ENCOUNTER — Encounter (INDEPENDENT_AMBULATORY_CARE_PROVIDER_SITE_OTHER): Payer: Self-pay | Admitting: Gastroenterology
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
## 2022-09-23 ENCOUNTER — Encounter: Payer: Self-pay | Admitting: Cardiology

## 2022-09-23 NOTE — Assessment & Plan Note (Signed)
BP seems to be high today.  This happened after he stopped beta-blocker.  Plan: Continue Imdur and furosemide.  Start bisoprolol-HCTZ 2.5-6.25 mg daily.  Add valsartan 40 mg daily.  Would need to recheck labs when seen by CVRR for BP reassessment.Marland Kitchen

## 2022-09-23 NOTE — Assessment & Plan Note (Signed)
Encouraged healthy diet.  Also encouraged increased walking.

## 2022-09-23 NOTE — Assessment & Plan Note (Signed)
Not currently on any antiplatelet agent.  Need to figure out what happened but I think we should probably have her restart Plavix since there is a concern about intolerance of aspirin.

## 2022-09-23 NOTE — Assessment & Plan Note (Signed)
Stable.  Deconditioned.  Also has underlying lung disease.  Less likely component would be diastolic heart failure except for now her blood pressure is higher.

## 2022-09-23 NOTE — Assessment & Plan Note (Signed)
Essentially single-vessel disease involving a diagonal branch that was treated with PCI years ago.  She is not really truly having active anginal pain she just has her mild musculoskeletal pains off and on. She has had no ischemic evaluations including most recently an echocardiogram and Myoview last around this time.  Not actively having any significant chest pain symptoms now. Plan:  Continue Tricor and Repatha for her lipids.  (I think we can probably reduce pravastatin dose)  Not currently on any type of antiplatelet agent.  Was supposed to have been on Plavix, but this has been stopped somewhere along the line.  She has a potential intolerance of aspirin.  We can discuss in follow-up but I think it would be beneficial to have her on clopidogrel, at a minimum every other day.  Beta-blocker was stopped but now she is having heart rates in the 90s.  I will cut split the difference and start her on bisoprolol 2.5 mg-HCTZ 6.25 mg -> reassess in close follow-up.

## 2022-09-23 NOTE — Assessment & Plan Note (Signed)
LAD was evaluated with a Myoview and an echocardiogram that were relatively normal.  Not showing signs of heart failure to suspect that she is having LBBB related myopathy.  Continue to monitor.

## 2022-09-24 DIAGNOSIS — I10 Essential (primary) hypertension: Secondary | ICD-10-CM | POA: Diagnosis not present

## 2022-09-24 DIAGNOSIS — J449 Chronic obstructive pulmonary disease, unspecified: Secondary | ICD-10-CM | POA: Diagnosis not present

## 2022-09-24 DIAGNOSIS — E782 Mixed hyperlipidemia: Secondary | ICD-10-CM | POA: Diagnosis not present

## 2022-09-24 DIAGNOSIS — N182 Chronic kidney disease, stage 2 (mild): Secondary | ICD-10-CM | POA: Diagnosis not present

## 2022-09-26 DIAGNOSIS — M542 Cervicalgia: Secondary | ICD-10-CM | POA: Diagnosis not present

## 2022-09-28 ENCOUNTER — Ambulatory Visit: Payer: Self-pay | Admitting: *Deleted

## 2022-09-28 ENCOUNTER — Encounter: Payer: Self-pay | Admitting: *Deleted

## 2022-09-28 NOTE — Patient Outreach (Signed)
  Care Coordination   Follow Up Visit Note   09/28/2022 Name: Vickie [name] MRN: [id number] DOB: [birth year]  Vickie [name] is a 76 y.o. year old female who sees Vickie Sites, MD for primary care. I spoke with  Vickie [name] by phone today.  What matters to the patients health and wellness today?  Controlling her blood pressure and heart rate and maintaining good energy level.   Goals Addressed               This Visit's Progress     Patient Stated     COMPLETED: Increased energy levels (pt-stated)        Care Coordination Interventions: Reviewed medications with patient and discussed contacting this care coordinator if concern for cost arise Reviewed scheduled/upcoming provider appointments including need for AWV, mammogram on 8/24, cardiology on 9/18, GI on 10/5.  Will also call pulmonology to schedule 6 month follow up, last visit in April Discussed plans with patient for ongoing care management follow up and provided patient with direct contact information for care management team Advised patient to discuss Concern for low energy levels with provider Assessed social determinant of health barriers       Other     Hypertension Management        Care Coordination Interventions: Evaluation of current treatment plan related to hypertension self management and patient's adherence to plan as established by provider Reviewed medications with patient and discussed importance of compliance Discussed plans with patient for ongoing care management follow up and provided patient with direct contact information for care management team Discussed prior issue of fatigue and decreased energy.  This has resolved since her cardiology adjusted her blood pressure medications.  Labetalol was discontinued. Provided general education on beta blockers Reviewed and discussed current medications.  Now taking bisoprolol-HCTZ 2.5-6.25 and valsartann '40mg'$ . She is tolerating the bisoprolol  well Discussed home blood pressure monitoring  Checking and recording multiple times a day, varying the times. She will take her log to her next cardio appointment Averaging around 125/75. Heart rate is in the 50's and 60's but she reports feeling much better than before. Typically tachycardic without beta blocker Discussed comorbidities DM is well managed with an A1C of 6.2 Discussed ADA carb modified diet Reviewed upcoming appts and verified that she has transportation provided by her daughter Cardiology PharmD 10/12/22 Vickie [name] (pulmonary) 09/29/22 Scheduled follow-up telephone call with Vickie David, RN Care Coordinator [phone]) for 11/29/22 at 9:30 Encouraged to reach out to [place] as needed Assisted with resetting My Chart password and provided verbal education on the benefits of using My Chart.         SDOH assessments and interventions completed:  Yes  SDOH Interventions Today    Flowsheet Row Most Recent Value  SDOH Interventions   Transportation Interventions Intervention Not Indicated        Care Coordination Interventions Activated:  Yes  Care Coordination Interventions:  Yes, provided   Follow up plan: Follow up call scheduled for 11/29/22 at 9:30 with Vickie David, RN Care Coordinator [phone])   Encounter Outcome:  Pt. Visit Completed   Vickie [name], BSN, RN-BC RN Care Coordinator [place]: [phone] Main #: [phone]

## 2022-09-29 ENCOUNTER — Ambulatory Visit: Payer: Medicare Other | Admitting: Pulmonary Disease

## 2022-09-29 ENCOUNTER — Ambulatory Visit (INDEPENDENT_AMBULATORY_CARE_PROVIDER_SITE_OTHER): Payer: Medicare Other | Admitting: Gastroenterology

## 2022-09-29 ENCOUNTER — Encounter: Payer: Self-pay | Admitting: Pulmonary Disease

## 2022-09-29 VITALS — BP 132/68 | HR 58 | Temp 98.4°F | Ht 60.0 in | Wt 181.8 lb

## 2022-09-29 DIAGNOSIS — Z23 Encounter for immunization: Secondary | ICD-10-CM | POA: Diagnosis not present

## 2022-09-29 DIAGNOSIS — J9611 Chronic respiratory failure with hypoxia: Secondary | ICD-10-CM | POA: Diagnosis not present

## 2022-09-29 DIAGNOSIS — J418 Mixed simple and mucopurulent chronic bronchitis: Secondary | ICD-10-CM

## 2022-09-29 DIAGNOSIS — G4733 Obstructive sleep apnea (adult) (pediatric): Secondary | ICD-10-CM

## 2022-09-29 DIAGNOSIS — J849 Interstitial pulmonary disease, unspecified: Secondary | ICD-10-CM

## 2022-09-29 DIAGNOSIS — J301 Allergic rhinitis due to pollen: Secondary | ICD-10-CM

## 2022-09-29 MED ORDER — ALBUTEROL SULFATE HFA 108 (90 BASE) MCG/ACT IN AERS: 2.0000 | INHALATION_SPRAY | Freq: Four times a day (QID) | RESPIRATORY_TRACT | 6 refills | Status: AC | PRN

## 2022-09-29 MED ORDER — ALBUTEROL SULFATE (2.5 MG/3ML) 0.083% IN NEBU
2.5000 mg | INHALATION_SOLUTION | Freq: Four times a day (QID) | RESPIRATORY_TRACT | 5 refills | Status: DC | PRN
Start: 2022-09-29 — End: 2024-08-22

## 2022-09-29 MED ORDER — FLUTICASONE PROPIONATE 50 MCG/ACT NA SUSP: 1.0000 | Freq: Every day | NASAL | 2 refills | Status: DC | PRN

## 2022-09-29 NOTE — Progress Notes (Signed)
Vickie [name], Vickie [name], Vickie [name]  Chief Complaint  Patient presents with   Follow-up    Everything going well.     Constitutional:  BP 132/68 (BP Location: Left Arm)   Pulse (!) 58   Temp 98.4 F (36.9 C) (Temporal)   Ht 5' (1.524 m)   Wt 181 lb 12.8 oz (82.5 kg)   SpO2 93% Comment: ra  BMI 35.51 kg/m   Past Medical History:  CAD s/p stent, HTN, HLD, OA, GERD, Depression, Anxiety, Back pain, HH, LBBB, Macular degeneration, Scoliosis, DM type 2  Past Surgical History:  Her  has a past surgical history that includes Lipoma excision (Right, ~ 2015); Diagnostic laparoscopy (Right); Pars plana vitrectomy w/ repair of macular hole (Right); LEFT HEART CATH Vickie CORONARY ANGIOGRAPHY (N/A, 09/18/2017); INTRAVASCULAR PRESSURE WIRE/FFR STUDY (N/A, 09/18/2017); CORONARY STENT INTERVENTION (N/A, 09/18/2017); Eye surgery; Knee arthroscopy (Right); Cataract extraction, bilateral (Bilateral, 2017); Vaginal hysterectomy; Dilation Vickie curettage of uterus; Cardiac catheterization (2671,2458; 2009); Colonoscopy with propofol (N/A, 11/09/2018); polypectomy (11/09/2018); transthoracic echocardiogram (08/04/2021); NM MYOVIEW LTD (08/04/2021); Coronary angioplasty; Esophagogastroduodenoscopy (egd) with propofol (N/A, 04/20/2022); Esophageal dilation (N/A, 04/20/2022); biopsy (04/20/2022); Vickie polypectomy (04/20/2022).  Brief Summary:  Vickie [name] is a 76 y.o. female former smoker with dyspnea after COVID 19 pneumonia in January 2021, Vickie obstructive sleep apnea.      Subjective:   She is here with her daughter.  She has allergy trouble again.  Has sinus Vickie ear congestion with post nasal drip.  Has been using Equate.  Hasn't been using flonase recently.  Spiriva helps.  She ran out of albuterol.  Has occasional cough Vickie chest congestion.  Her CPAP mask comes off sometimes while she is asleep.  Pressure setting is comfortable.  Sleeping well.  She uses 2 liters oxygen as  needed with exertion.  Physical Exam:   Appearance - well kempt   ENMT - no sinus tenderness, no oral exudate, no LAN, Mallampati 3 airway, no stridor  Respiratory - equal breath sounds bilaterally, no wheezing or rales  CV - s1s2 regular rate Vickie rhythm, no murmurs  Ext - no clubbing, no edema, missing several digits on both hands  Skin - no rashes  Psych - normal mood Vickie affect  [name] testing:  PFT 05/22/20 >> FEV1 1.44 (79%), FEV1% 88, TLC 3.36 (75%), DLCO 76% PFT 05/28/21 >> FEV1 1.48 (83%), FEV1% 83, TLC 3.80 (85%), DLCO 106%  Chest Imaging:  CT angio chest 12/29/19 >> multifocal GGO HRCT chest 06/15/20 >> atherosclerosis, prominent patchy peribronchovascular Vickie subpleural reticulation, minimal GGO, mild/mod traction BTX HRCT chest 06/17/21 >> patchy areas of ground-glass attenuation, septal thickening, thickening of the peribronchovascular interstitium, scattere areas of mild subpleural reticulation Vickie regional areas of architectural distortion, moderate air trapping  Sleep Tests:  HST 08/26/20 >> AHI 7, SpO2 low 81%.  Spent 354.2 min with SpO2 < 89%. CPAP titration 10/06/20 >> CPAP 9 cm H2O >> AHI 5.6, didn't need supplemental oxygen. ONO with CPAP 11/25/21 >> test time 8 hrs 32 min.  Baseline SpO2 90%, low SpO2 83%.  Spent 2 hrs 19 min with SpO2 < 88%. CPAP 09/12/22 to 09/27/22 >> used on 14 of 16 nights with average 7 hrs 6 min.  Average AHI 3.2 with CPAP 9 cm H2O  Cardiac Tests:  Echo 08/12/21 >> EF 55 to 60%  Social History:  She  reports that she quit smoking about 46 years ago. Her smoking use included cigarettes. She has a 25.50 pack-year  smoking history. She has been exposed to tobacco smoke. She has never used smokeless tobacco. She reports that she does not drink alcohol Vickie does not use drugs.  Family History:  Her family history includes Breast cancer in her maternal aunt Vickie paternal aunt; CAD in her mother.     Assessment/Plan:   ILD after COVID 19  infection in January 2021. - continue clinical monitoring - defer additional imaging studies unless she develops progressive respiratory symptoms  Chronic bronchitis. - stiolto caused chest discomfort - continue spiriva respimat  - refill for ventolin Vickie nebulized albuterol sent - discussed roles for different inhalers  - influenza vaccination today  Season allergic rhinitis. - prn flonase  Chronic respiratory failure with hypoxia. - 2 liters oxygen prn with exertion  Obstructive sleep apnea. - she is compliant with CPAP Vickie reports benefit from therapy - she uses [employer] for her DME - current CPAP ordered October 2021 - continue CPAP 9 cm HO  Coronary artery disease. - followed by Dr. Glenetta Hew with [facility] - advised her to inform Dr. Ellyn Hack of the issues she was having with pravastatin use  GERD, Esophageal dysphagia. - previously followed by Dr. Hildred Laser with Gastroenterology  Time Spent Involved in Patient [name] on Day of Examination:  36 minutes  Follow up:   Patient Instructions  Try using flonase 1 spray in each nostril nightly during allergy season  High dose flu shot today  Follow up in 6 months  Medication List:   Allergies as of 09/29/2022       Reactions   Adhesive [tape] Other (See Comments)   REACTION: redness/irritation at application site. **Certain bandages/adhesives cause this reaction**   Avelox [moxifloxacin] Anaphylaxis   Bactrim [sulfamethoxazole-trimethoprim] Anaphylaxis, Shortness Of Breath, Rash, Other (See Comments)   REACTION: Choking, inability to swallow, redness   Levaquin [levofloxacin] Anaphylaxis, Shortness Of Breath, Rash, Other (See Comments)   Reaction:Choking Brand name Levaquin ok per pt   Mucinex [guaifenesin Er] Anaphylaxis, Shortness Of Breath, Rash   Peanut-containing Drug Products Anaphylaxis   Penicillins Other (See Comments)   "Passed out" Has patient had a PCN reaction causing immediate rash,  facial/tongue/throat swelling, SOB or lightheadedness with hypotension: Unknown Has patient had a PCN reaction causing severe rash involving mucus membranes or skin necrosis: No Has patient had a PCN reaction that required hospitalization: No Has patient had a PCN reaction occurring within the last 10 years: No If all of the above answers are "NO", then may proceed with Cephalosporin use.   Sudafed [pseudoephedrine Hcl] Shortness Of Breath, Rash, Other (See Comments)   REACTION: Choking, redness, inability to swallow   Crestor [rosuvastatin] Other (See Comments)   Leg pain   Lipitor [atorvastatin] Other (See Comments)   Leg pain   Vytorin [ezetimibe-simvastatin] Other (See Comments)   Leg pain   Fish Allergy Nausea Vickie Vomiting   Fish Oil Nausea Vickie Vomiting   Norvasc [amlodipine] Other (See Comments)   unknown   Shrimp [shellfish Allergy] Nausea Vickie Vomiting        Medication List        Accurate as of September 29, 2022  9:23 AM. If you have any questions, ask your nurse or doctor.          STOP taking these medications    fenofibrate 145 MG tablet Commonly known as: Tricor Stopped by: Chesley Mires, MD   methylPREDNISolone 4 MG Tbpk tablet Commonly known as: MEDROL DOSEPAK Stopped by: Chesley Mires, MD  TAKE these medications    albuterol 108 (90 Base) MCG/ACT inhaler Commonly known as: VENTOLIN HFA Inhale 2 puffs into the lungs every 6 (six) hours as needed for wheezing or shortness of breath.   albuterol (2.5 MG/3ML) 0.083% nebulizer solution Commonly known as: PROVENTIL Take 3 mLs (2.5 mg total) by nebulization every 6 (six) hours as needed for wheezing or shortness of breath.   ALPRAZolam 0.5 MG tablet Commonly known as: XANAX Take 0.25 mg by mouth 2 (two) times daily.   bisoprolol-hydrochlorothiazide 2.5-6.25 MG tablet Commonly known as: ZIAC Take 1 tablet by mouth daily.   busPIRone 15 MG tablet Commonly known as: BUSPAR Take 15 mg by mouth 2  (two) times daily.   CALCIUM 600 + D PO Take 1 tablet by mouth daily.   CoQ10 100 MG Caps Take 100 mg by mouth every morning.   CRANBERRY PO Take 1 capsule by mouth daily.   escitalopram 20 MG tablet Commonly known as: LEXAPRO Take 20 mg by mouth every morning.   esomeprazole 20 MG capsule Commonly known as: NEXIUM Take 20 mg by mouth daily.   fluticasone 50 MCG/ACT nasal spray Commonly known as: FLONASE Place 1 spray into both nostrils daily as needed for allergies or rhinitis. Started by: Chesley Mires, MD   furosemide 20 MG tablet Commonly known as: LASIX TAKE ONE TABLET EVERY OTHER DAY.   Gaviscon 80-14.2 MG Chew Generic drug: Alum Hydroxide-Mag Trisilicate Chew 2 tablets by mouth at bedtime.   HAIR/SKIN/NAILS PO Take 1 tablet by mouth at bedtime.   isosorbide mononitrate 60 MG 24 hr tablet Commonly known as: IMDUR TAKE ONE TABLET BY MOUTH ONCE DAILY.   mirtazapine 15 MG tablet Commonly known as: REMERON Take 7.5 mg by mouth at bedtime.   multivitamin tablet Take 1 tablet by mouth at bedtime.   nitroGLYCERIN 0.4 MG SL tablet Commonly known as: NITROSTAT PLACE 1 TAB UNDER TONGUE EVERY 5 MIN IF NEEDED FOR CHEST PAIN. MAY USE 3 TIMES.NO RELIEF CALL 911.   potassium chloride SA 20 MEQ tablet Commonly known as: KLOR-CON M TAKE 1 TABLET EVERY OTHER DAY, TAKE ON DAYS THAT YOU TAKE FUROSEMIDE.   pravastatin 80 MG tablet Commonly known as: PRAVACHOL Take 1 tablet (80 mg total) by mouth every evening.   Repatha SureClick 992 MG/ML Soaj Generic drug: Evolocumab Inject 140 mg into the skin every 14 (fourteen) days.   Spiriva Respimat 2.5 MCG/ACT Aers Generic drug: Tiotropium Bromide Monohydrate INHALE 2 PUFFS INTO THE LUNGS ONCE DAILY.   valsartan 40 MG tablet Commonly known as: DIOVAN Take 1 tablet (40 mg total) by mouth daily.        Signature:  Chesley Mires, MD Erma Pager - [phone] 09/29/2022, 9:23 AM

## 2022-09-29 NOTE — Patient Instructions (Signed)
Try using flonase 1 spray in each nostril nightly during allergy season  High dose flu shot today  Follow up in 6 months

## 2022-10-04 DIAGNOSIS — M25811 Other specified joint disorders, right shoulder: Secondary | ICD-10-CM | POA: Diagnosis not present

## 2022-10-04 DIAGNOSIS — M25511 Pain in right shoulder: Secondary | ICD-10-CM | POA: Diagnosis not present

## 2022-10-05 ENCOUNTER — Telehealth: Payer: Self-pay

## 2022-10-05 NOTE — Telephone Encounter (Signed)
   Patient Name: NERIA PROCTER  DOB: 1946-12-18 MRN: [id number]  Primary Cardiologist: Glenetta Hew, MD  Chart reviewed as part of pre-operative protocol coverage. Pt recently evaluated in the office w/ finding of sinus bradycardia and HTN. She has a f/u appt on 10/18.  Will await follow-up visit prior to weighing in on perioperative cardiovascular fitness.  Murray Hodgkins, NP 10/05/2022, 2:45 PM

## 2022-10-05 NOTE — Telephone Encounter (Signed)
   Pre-operative Risk Assessment    Patient Name: Vickie [name]  DOB: [birth year] MRN: [id number]      Request for Surgical Clearance    Procedure:   Right reserve TSA    Date of Surgery:  Clearance TBD                                 Surgeon:  Lynda Rainwater MD Surgeon's Group or Practice Name:  [facility]  Phone number:  [phone] Fax number:  [phone]   Type of Clearance Requested:   - Medical    Type of Anesthesia:  CHOICE   Additional requests/questions:  Please fax a copy of LAST OFFICE NOTE [phone] to the surgeon's office.  Signed,   Jeanmarie Plant Morrisa Aldaba  CCM 10/05/2022, 12:42 PM

## 2022-10-12 ENCOUNTER — Encounter: Payer: Self-pay | Admitting: Pharmacist Clinician (PhC)/ Clinical Pharmacy Specialist

## 2022-10-12 ENCOUNTER — Ambulatory Visit
Payer: Medicare Other | Attending: Internal Medicine | Admitting: Pharmacist Clinician (PhC)/ Clinical Pharmacy Specialist

## 2022-10-12 ENCOUNTER — Telehealth: Payer: Self-pay | Admitting: Cardiology

## 2022-10-12 DIAGNOSIS — I10 Essential (primary) hypertension: Secondary | ICD-10-CM | POA: Diagnosis not present

## 2022-10-12 NOTE — Progress Notes (Signed)
10/12/2022 ELFA WOOTON 1946/07/29 [id number]   HPI:  Vickie [name] is a 76 y.o. female patient of Dr Ellyn Hack, with a PMH below who presents today for hypertension clinic evaluation.  She was last seen by Dr. Ellyn Hack about a month ago, at which time her BP was 154/88.  He started her on bisoprolol hctz 2.5/6.25 daily and asked that she return in one month for follow up.   Today she is in the office with her daughter.  She notes that in the past 2 weeks she has had more difficulty in breathing and associates it with the drop in outside temperatures.  She describes it as short of breath, with it being harder to take a deep breath.  She does have home O2 and uses as needed.  She also has been using her albuterol nebulizer with some benefit.  States all of this started after she saw Dr. Llana Aliment (Oct 5) and he did give her a flu shot that day in the office.    Past Medical History: CAD PCI to diagonal branch years ago  HLD 4/23 LDL 28, on repatha (down from 122), TG unchanged on fenofibrate  Pre-diabetes 9/23 A1c 6, (6.4 in 2021)     Blood Pressure Goal:  130/80  Current Medications:   bisoprolol-hctz 2.5-6.25 mg qd, valsartan 40 mg qd - both in Am took today    Social Hx:  no tobacco, no alcohol. Coffee - 2 + big cups per day   Diet:   eats out regularly - mostly fast food: [place], [employer], [employer] pizza; at home will much on [place] breakdfast bar, toast   Exercise: none  Home BP readings:   has 28 readings with her today, range 762-831 systolic; diastolic all WNL  Only 7 readings > 517 systolic and of those only 2 > 140.    Intolerances:  amlodipine - rxn unknown  Labs: 4/23:  Na 142, K 4.1, Glu 150, BUN 14, SCr 0.64 GFR > 60   Wt Readings from Last 3 Encounters:  10/12/22 181 lb (82.1 kg)  09/29/22 181 lb 12.8 oz (82.5 kg)  09/12/22 182 lb 9.6 oz (82.8 kg)   BP Readings from Last 3 Encounters:  10/12/22 122/69  09/29/22 132/68  09/12/22 (!) 154/88   Pulse  Readings from Last 3 Encounters:  10/12/22 (!) 58  09/29/22 (!) 58  09/12/22 95    Current Outpatient Medications  Medication Sig Dispense Refill   albuterol (PROVENTIL) (2.5 MG/3ML) 0.083% nebulizer solution Take 3 mLs (2.5 mg total) by nebulization every 6 (six) hours as needed for wheezing or shortness of breath. 360 mL 5   albuterol (VENTOLIN HFA) 108 (90 Base) MCG/ACT inhaler Inhale 2 puffs into the lungs every 6 (six) hours as needed for wheezing or shortness of breath. 8 g 6   ALPRAZolam (XANAX) 0.5 MG tablet Take 0.25 mg by mouth 2 (two) times daily.     Alum Hydroxide-Mag Trisilicate (GAVISCON) 61-60.7 MG CHEW Chew 2 tablets by mouth at bedtime.     Biotin w/ Vitamins C & E (HAIR/SKIN/NAILS PO) Take 1 tablet by mouth at bedtime.     bisoprolol-hydrochlorothiazide (ZIAC) 2.5-6.25 MG tablet Take 1 tablet by mouth daily. 90 tablet 3   busPIRone (BUSPAR) 15 MG tablet Take 15 mg by mouth 2 (two) times daily.     Calcium Carb-Cholecalciferol (CALCIUM 600 + D PO) Take 1 tablet by mouth daily.      Coenzyme Q10 (COQ10)  100 MG CAPS Take 100 mg by mouth every morning.     CRANBERRY PO Take 1 capsule by mouth daily.      escitalopram (LEXAPRO) 20 MG tablet Take 20 mg by mouth every morning.      esomeprazole (NEXIUM) 20 MG capsule Take 20 mg by mouth daily.     Evolocumab (REPATHA SURECLICK) 007 MG/ML SOAJ Inject 140 mg into the skin every 14 (fourteen) days. 2 mL 11   fluticasone (FLONASE) 50 MCG/ACT nasal spray Place 1 spray into both nostrils daily as needed for allergies or rhinitis. 16 g 2   furosemide (LASIX) 20 MG tablet TAKE ONE TABLET EVERY OTHER DAY. 90 tablet 3   isosorbide mononitrate (IMDUR) 60 MG 24 hr tablet TAKE ONE TABLET BY MOUTH ONCE DAILY. 30 tablet 8   mirtazapine (REMERON) 15 MG tablet Take 7.5 mg by mouth at bedtime.      Multiple Vitamin (MULTIVITAMIN) tablet Take 1 tablet by mouth at bedtime.      nitroGLYCERIN (NITROSTAT) 0.4 MG SL tablet PLACE 1 TAB UNDER TONGUE  EVERY 5 MIN IF NEEDED FOR CHEST PAIN. MAY USE 3 TIMES.NO RELIEF CALL 911. 25 tablet 5   potassium chloride SA (KLOR-CON) 20 MEQ tablet TAKE 1 TABLET EVERY OTHER DAY, TAKE ON DAYS THAT YOU TAKE FUROSEMIDE. 45 tablet 6   pravastatin (PRAVACHOL) 80 MG tablet Take 1 tablet (80 mg total) by mouth every evening. 90 tablet 3   Tiotropium Bromide Monohydrate (SPIRIVA RESPIMAT) 2.5 MCG/ACT AERS INHALE 2 PUFFS INTO THE LUNGS ONCE DAILY. 4 g 6   valsartan (DIOVAN) 40 MG tablet Take 1 tablet (40 mg total) by mouth daily. 90 tablet 3   No current facility-administered medications for this visit.    Allergies  Allergen Reactions   Adhesive [Tape] Other (See Comments)    REACTION: redness/irritation at application site. **Certain bandages/adhesives cause this reaction**   Avelox [Moxifloxacin] Anaphylaxis   Bactrim [Sulfamethoxazole-Trimethoprim] Anaphylaxis, Shortness Of Breath, Rash and Other (See Comments)    REACTION: Choking, inability to swallow, redness   Levaquin [Levofloxacin] Anaphylaxis, Shortness Of Breath, Rash and Other (See Comments)    Reaction:Choking Brand name Levaquin ok per pt   Mucinex [Guaifenesin Er] Anaphylaxis, Shortness Of Breath and Rash   Peanut-Containing Drug Products Anaphylaxis   Penicillins Other (See Comments)    "Passed out" Has patient had a PCN reaction causing immediate rash, facial/tongue/throat swelling, SOB or lightheadedness with hypotension: Unknown Has patient had a PCN reaction causing severe rash involving mucus membranes or skin necrosis: No Has patient had a PCN reaction that required hospitalization: No Has patient had a PCN reaction occurring within the last 10 years: No If all of the above answers are "NO", then may proceed with Cephalosporin use.    Sudafed [Pseudoephedrine Hcl] Shortness Of Breath, Rash and Other (See Comments)    REACTION: Choking, redness, inability to swallow   Crestor [Rosuvastatin] Other (See Comments)    Leg pain   Lipitor  [Atorvastatin] Other (See Comments)    Leg pain   Vytorin [Ezetimibe-Simvastatin] Other (See Comments)    Leg pain   Fish Allergy Nausea And Vomiting   Fish Oil Nausea And Vomiting   Norvasc [Amlodipine] Other (See Comments)    unknown   Shrimp [Shellfish Allergy] Nausea And Vomiting    Past Medical History:  Diagnosis Date   Anginal pain ([place])    Anxiety    Anxiety disorder    With apparent panic attacks   Arthritis    "  hands, knees, back" (09/18/2017)   CAD S/P percutaneous coronary angioplasty 08/2017   Single-vessel CAD involving second diagonal branch treated with DES stent Synergy 2.25 mm x 12 mm (2.4 mm)   Chronic back pain    "all over" (09/18/2017)   COPD (chronic obstructive pulmonary disease) ([place])    PFTs were done in 2008 at [facility]   Depression    Dyspnea    Dysrhythmia    LBBB   Essential hypertension    GERD (gastroesophageal reflux disease)    Hiatal hernia    History of left bundle branch block (LBBB) 08/2017   Rate Related LBBB noted in cath lab   Hyperlipidemia    Macular degeneration    right eye   Myocardial infarction [facility]) 2007   "medically induced"   Nonocclusive coronary atherosclerosis of native coronary artery 2006 through 2012   multi caths 2012, 2006, [id number] (1700 complicated by catheter-induced dissection of small nondominant RCA, patent in 2009 with no residual abnormality); Myoview August 2013: LOW RISK, normal EF. ; Echocardiogram [name] - January 2014) moderate LVH, EF 55-65%. No significant valvular disease.   OSA (obstructive sleep apnea) 9/25/ 2012   tested 2009; tetested sleep study 07/2011--titration 09/20/2011 now use Bi-PAP   OSA on CPAP    Pneumonia    "several times in 2017; 3 times already this year" (09/18/2017)   Scoliosis    Type II diabetes mellitus (HCC)     Blood pressure 122/69, pulse (!) 58, height 5' (1.524 m), weight 181 lb (82.1 kg).     Essential hypertension Patient with essential hypertension, now  doing well on combination of bisoprolol hctz 2.5/6.25 and valsartan 40 mg.  She has had a few heart rates drop into the 40's, and I have asked that on those days she just skip the bisoprolol/hctz.  If she notices that she is skipping more than 1-2 tables per week, she should reach out to the office.  Otherwise, BP seems to be well controlled with current combination.  We can see her back as needed should her BP increase significantly.     Tommy Medal PharmD CPP [place] [address] [place], [place] [address] [phone]

## 2022-10-12 NOTE — Telephone Encounter (Signed)
   Patient Name: Vickie [name]  DOB: 1946-08-20 MRN: [id number]  Primary Cardiologist: Glenetta Hew, MD  Chart reviewed as part of pre-operative protocol coverage. Given past medical history and time since last visit, based on ACC/AHA guidelines, and per Dr. Ellyn Hack, primary cardiologist,  [name] is at acceptable risk for the planned procedure without further cardiovascular testing.   The patient was advised that if she develops new symptoms prior to surgery to contact our office to arrange for a follow-up visit, and she verbalized understanding.  I will route this recommendation to the requesting party via Epic fax function and remove from pre-op pool.  Please call with questions.  Lenna Sciara, NP 10/12/2022, 12:21 PM

## 2022-10-12 NOTE — Patient Instructions (Signed)
Thank your for choosing [facility]  Take your BP meds as follows:  Continue with your current medications  Check BP every morning, after using the bathroom but before you have anything to eat or drink.  If your heart rate is < 50, please skip your bisoprolol hydrochlorothiazide tablet for just that day.    Bring all of your meds, your BP cuff and your record of home blood pressures to your next appointment.  Exercise as you're able, try to walk approximately 30 minutes per day.  Keep salt intake to a minimum, especially watch canned and prepared boxed foods.  Eat more fresh fruits and vegetables and fewer canned items.  Avoid eating in fast food restaurants.    HOW TO TAKE YOUR BLOOD PRESSURE: Rest 5 minutes before taking your blood pressure.  Don't smoke or drink caffeinated beverages for at least 30 minutes before. Take your blood pressure before (not after) you eat. Sit comfortably with your back supported and both feet on the floor (don't cross your legs). Elevate your arm to heart level on a table or a desk. Use the proper sized cuff. It should fit smoothly and snugly around your bare upper arm. There should be enough room to slip a fingertip under the cuff. The bottom edge of the cuff should be 1 inch above the crease of the elbow. Ideally, take 3 measurements at one sitting and record the average.

## 2022-10-12 NOTE — Assessment & Plan Note (Signed)
Patient with essential hypertension, now doing well on combination of bisoprolol hctz 2.5/6.25 and valsartan 40 mg.  She has had a few heart rates drop into the 40's, and I have asked that on those days she just skip the bisoprolol/hctz.  If she notices that she is skipping more than 1-2 tables per week, she should reach out to the office.  Otherwise, BP seems to be well controlled with current combination.  We can see her back as needed should her BP increase significantly.

## 2022-10-12 NOTE — Telephone Encounter (Signed)
Pt states that she is returning call from NP, Diona Browner. Please advise

## 2022-10-31 DIAGNOSIS — M47812 Spondylosis without myelopathy or radiculopathy, cervical region: Secondary | ICD-10-CM | POA: Diagnosis not present

## 2022-10-31 DIAGNOSIS — E119 Type 2 diabetes mellitus without complications: Secondary | ICD-10-CM | POA: Diagnosis not present

## 2022-10-31 DIAGNOSIS — E1140 Type 2 diabetes mellitus with diabetic neuropathy, unspecified: Secondary | ICD-10-CM | POA: Diagnosis not present

## 2022-10-31 DIAGNOSIS — Z6836 Body mass index (BMI) 36.0-36.9, adult: Secondary | ICD-10-CM | POA: Diagnosis not present

## 2022-10-31 DIAGNOSIS — M5481 Occipital neuralgia: Secondary | ICD-10-CM | POA: Diagnosis not present

## 2022-10-31 DIAGNOSIS — Z0001 Encounter for general adult medical examination with abnormal findings: Secondary | ICD-10-CM | POA: Diagnosis not present

## 2022-11-05 ENCOUNTER — Encounter (INDEPENDENT_AMBULATORY_CARE_PROVIDER_SITE_OTHER): Payer: Self-pay | Admitting: Gastroenterology

## 2022-11-24 DIAGNOSIS — E1151 Type 2 diabetes mellitus with diabetic peripheral angiopathy without gangrene: Secondary | ICD-10-CM | POA: Diagnosis not present

## 2022-11-24 DIAGNOSIS — E1140 Type 2 diabetes mellitus with diabetic neuropathy, unspecified: Secondary | ICD-10-CM | POA: Diagnosis not present

## 2022-11-29 ENCOUNTER — Encounter: Payer: Self-pay | Admitting: *Deleted

## 2022-11-29 ENCOUNTER — Ambulatory Visit: Payer: Self-pay | Admitting: *Deleted

## 2022-11-29 NOTE — Patient Outreach (Signed)
  Care Coordination   Follow Up Visit Note   11/29/2022 Name: Vickie [name] MRN: [id number] DOB: 07/14/1946  Vickie [name] is a 76 y.o. year old female who sees Sharilyn Sites, MD for primary care. I spoke with  [name] by phone today.  What matters to the patients health and wellness today?  Continue to manage blood pressure, heart rate, and blood sugar    Goals Addressed             This Visit's Progress    COMPLETED: Hypertension Management       Care Coordination Interventions: Evaluation of current treatment plan related to hypertension self management and patient's adherence to plan as established by provider Reviewed medications with patient and discussed importance of compliance Discussed plans with patient for ongoing care management follow up and provided patient with direct contact information for care management team Discussed prior issue of fatigue and decreased energy.  This has resolved since her cardiology adjusted her blood pressure medications.  Labetalol was discontinued. Provided general education on beta blockers Reviewed and discussed current medications.  Now taking bisoprolol-HCTZ 2.5-6.25 and valsartann '40mg'$ . She is tolerating the bisoprolol well Discussed home blood pressure monitoring  Checking and recording multiple times a day, varying the times. She will take her log to her next cardio appointment Averaging around 125/75. Heart rate is in the 50's and 60's but she reports feeling much better than before. Typically tachycardic without beta blocker Discussed comorbidities DM is well managed with an A1C of 6.2 Discussed ADA carb modified diet and how to manage diet during the holiday season Reviewed upcoming appts and verified that she has transportation provided by her daughter Dr Ellyn Hack (cardiology) 01/03/23 Assessed for additional resource or care coordination needs. Denies at this time.  Provided with [facility] telephone number and encouraged  to reach out if needed.  No further planned follow-up required at this time         SDOH assessments and interventions completed:  Yes SDOH Interventions Today    Flowsheet Row Most Recent Value  SDOH Interventions   Food Insecurity Interventions Intervention Not Indicated  Housing Interventions Intervention Not Indicated  Transportation Interventions Intervention Not Indicated  Utilities Interventions Intervention Not Indicated  Financial Strain Interventions Intervention Not Indicated         Care Coordination Interventions:  Yes, provided   Follow up plan: No further intervention required.   Encounter Outcome:  Pt. Visit Completed   [name], BSN, RN-BC RN Care Coordinator [facility] Direct Dial: [phone] Main #: [phone]

## 2022-12-05 ENCOUNTER — Encounter (INDEPENDENT_AMBULATORY_CARE_PROVIDER_SITE_OTHER): Payer: Self-pay | Admitting: Gastroenterology

## 2022-12-05 ENCOUNTER — Ambulatory Visit (INDEPENDENT_AMBULATORY_CARE_PROVIDER_SITE_OTHER): Payer: Medicare Other | Admitting: Gastroenterology

## 2022-12-05 VITALS — BP 132/75 | HR 56 | Temp 97.8°F | Ht 60.0 in | Wt 180.5 lb

## 2022-12-05 DIAGNOSIS — R1319 Other dysphagia: Secondary | ICD-10-CM

## 2022-12-05 NOTE — Patient Instructions (Signed)
Chew food thoroughly and cut food in small pieces Avoid talking when eating your meals Please reach [place] back if interested on proceeding with swallowing study (esophageal manometry)

## 2022-12-05 NOTE — Progress Notes (Signed)
Vickie [name], M.D. Gastroenterology & Hepatology [facility] [address] [place], [place] [address]  Primary Care Physician: Vickie Sites, MD [address] [place] [address]  I will communicate my assessment and recommendations to the referring MD via EMR.  Problems: Chronic dysphagia  History of Present Illness: Vickie [name] is a 76 y.o. female with PMH anxiety, CAD s/p stent placed, depression, COPD, HLD, HTN, GERD, DM, OSA,  who presents for follow up of chronic dysphagia.  The patient was last seen on 03/29/2022. At that time, the patient was scheduled for an EGD for evaluation of dysphagia.  EGD was performed on 4/26/2023Patient underwent an empiric dilation with a Maloney up to 76 [place] without mucosal disruption.  There were some gastric polyps and presence of gastritis.  Stomach was biopsied with results below.  She has had dysphagia chronically for at least 10 years. States she has felt some improvement with her dilation but still having some dysphagia with solids and when swallowing pills.  She has had epiosdes of choking when not chewing her food and rushing to eat. If she takes times to chew, she will not have symptoms. Only has issues with choking when she rushes eating.  The patient denies having any nausea, vomiting, fever, chills, hematochezia, melena, hematemesis, abdominal distention, abdominal pain, diarrhea, jaundice, pruritus or weight loss.  Path: A. STOMACH, BODY, BIOPSY:  Mild reactive gastropathy with minimal chronic gastritis  Negative for H. pylori, intestinal metaplasia, dysplasia and carcinoma   B. STOMACH, POLYPECTOMY:  Reactive gastropathy with foveolar hyperplasia compatible with  hyperplastic polyp  Minimal chronic gastritis  Negative for H. pylori, intestinal metaplasia, dysplasia and carcinoma   Last EGD: as above Last Colonoscopy: 11/09/2018 - Two 4 to 6 mm polyps at the hepatic  flexure, removed with a cold snare. Resected and retrieved. Clip (MR conditional) was placed. - Path TA x2 - One small polyp at the hepatic flexure. Biopsied. - Diverticulosis in the sigmoid colon. - External hemorrhoids.  Recommended repeat in 5 years  Past Medical History: Past Medical History:  Diagnosis Date   Anginal pain ([place])    Anxiety    Anxiety disorder    With apparent panic attacks   Arthritis    "hands, knees, back" (09/18/2017)   CAD S/P percutaneous coronary angioplasty 08/2017   Single-vessel CAD involving second diagonal branch treated with DES stent Synergy 2.25 mm x 12 mm (2.4 mm)   Chronic back pain    "all over" (09/18/2017)   COPD (chronic obstructive pulmonary disease) ([place])    PFTs were done in 2008 at [facility]   Depression    Dyspnea    Dysrhythmia    LBBB   Essential hypertension    GERD (gastroesophageal reflux disease)    Hiatal hernia    History of left bundle branch block (LBBB) 08/2017   Rate Related LBBB noted in cath lab   Hyperlipidemia    Macular degeneration    right eye   Myocardial infarction [facility]) 2007   "medically induced"   Nonocclusive coronary atherosclerosis of native coronary artery 2006 through 2012   multi caths 2012, 2006, [id number] (3903 complicated by catheter-induced dissection of small nondominant RCA, patent in 2009 with no residual abnormality); Myoview August 2013: LOW RISK, normal EF. ; Echocardiogram Vickie [name] [facility] - January 2014) moderate LVH, EF 55-65%. No significant valvular disease.   OSA (obstructive sleep apnea) 9/25/ 2012   tested 2009; tetested sleep study 07/2011--titration 09/20/2011 now  use Bi-PAP   OSA on CPAP    Pneumonia    "several times in 2017; 3 times already this year" (09/18/2017)   Scoliosis    Type II diabetes mellitus [facility])     Past Surgical History: Past Surgical History:  Procedure Laterality Date   BIOPSY  04/20/2022   Procedure: BIOPSY;  Surgeon: Rogene Houston, MD;  [facility];  Service: Endoscopy;;   CARDIAC CATHETERIZATION  2006,2007; 2009   Non-occlusice CAD - only 80% ostial SP1;  NON DOMINANNT  RCA (catheter insuced dissection with MI in 2007 --> resolved by 2009 cath)   CATARACT EXTRACTION, BILATERAL Bilateral 2017   Toric lens "in the left eye only"   COLONOSCOPY WITH PROPOFOL N/A 11/09/2018   Procedure: COLONOSCOPY WITH PROPOFOL;  Surgeon: Rogene Houston, MD;  Location: [facility];  Service: Endoscopy;  Laterality: N/A;  2:25   CORONARY ANGIOPLASTY     x 1 stent   CORONARY STENT INTERVENTION N/A 09/18/2017   Procedure: CORONARY STENT INTERVENTION;  Surgeon: Leonie Man, MD;  Location: [facility] INVASIVE CV LAB: DES PCI Diag2: Synergy DES 2.25 mm x 12 mm (2.4 mm)   DIAGNOSTIC LAPAROSCOPY Right    DILATION AND CURETTAGE OF UTERUS     ESOPHAGEAL DILATION N/A 04/20/2022   Procedure: ESOPHAGEAL DILATION;  Surgeon: Rogene Houston, MD;  [facility];  Service: Endoscopy;  Laterality: N/A;   ESOPHAGOGASTRODUODENOSCOPY (EGD) WITH PROPOFOL N/A 04/20/2022   Procedure: ESOPHAGOGASTRODUODENOSCOPY (EGD) WITH PROPOFOL;  Surgeon: Rogene Houston, MD;  [facility];  Service: Endoscopy;  Laterality: N/A;  240   EYE SURGERY     INTRAVASCULAR PRESSURE WIRE/FFR STUDY N/A 09/18/2017   Procedure: INTRAVASCULAR PRESSURE WIRE/FFR STUDY;  Surgeon: Leonie Man, MD;  Location: [facility] CV LAB;  Service: Cardiovascular: FFR Diag 2 -- 0.78 (significcant) --> PCI   KNEE ARTHROSCOPY Right    LEFT HEART CATH AND CORONARY ANGIOGRAPHY N/A 09/18/2017   Procedure: LEFT HEART CATH AND CORONARY ANGIOGRAPHY;  Surgeon: Leonie Man, MD;  Location: [facility] INVASIVE CV LAB: Cul[place] - 80% Diag2 (FFR 0.78)- PCI.  pLAD 40%, pCx 40%. Post PCI LBBB. LVEDP - ~20 mmHg. EF 55-60%   LIPOMA EXCISION Right ~ 2015   anterior abdomen   NM MYOVIEW LTD  08/04/2021   LOW RISK. EF 55-60%. Mild fixed apical defect - /w breast attenuation.   PARS PLANA VITRECTOMY W/ REPAIR OF  MACULAR HOLE Right    Unsuccessful repair.  Hole filled   POLYPECTOMY  11/09/2018   Procedure: POLYPECTOMY;  Surgeon: Rogene Houston, MD;  [facility];  Service: Endoscopy;;  colon   POLYPECTOMY  04/20/2022   Procedure: POLYPECTOMY;  Surgeon: Rogene Houston, MD;  [facility];  Service: Endoscopy;;  bx of polyps   TRANSTHORACIC ECHOCARDIOGRAM  08/04/2021   Normal LVEF 55-60%.  Septal movement consistent with LBBB now present.  Unable to assess diastolic function.  Normal RV size and function.  Unable assess PAP.  Normal aortic and mitral valves.  Normal RAP.   VAGINAL HYSTERECTOMY      Family History: Family History  Problem Relation Age of Onset   CAD Mother    Breast cancer Maternal Aunt    Breast cancer Paternal Aunt     Social History: Social History   Tobacco Use  Smoking Status Former   Packs/day: 3.00   Years: 8.50   Total pack years: 25.50   Types: Cigarettes  Quit date: 04/18/1976   Years since quitting: 46.6   Passive exposure: Past  Smokeless Tobacco Never   Social History   Substance and Sexual Activity  Alcohol Use No   Social History   Substance and Sexual Activity  Drug Use No    Allergies: Allergies  Allergen Reactions   Adhesive [Tape] Other (See Comments)    REACTION: redness/irritation at application site. **Certain bandages/adhesives cause this reaction**   Avelox [Moxifloxacin] Anaphylaxis   Bactrim [Sulfamethoxazole-Trimethoprim] Anaphylaxis, Shortness Of Breath, Rash and Other (See Comments)    REACTION: Choking, inability to swallow, redness   Levaquin [Levofloxacin] Anaphylaxis, Shortness Of Breath, Rash and Other (See Comments)    Reaction:Choking Brand name Levaquin ok per pt   Mucinex [Guaifenesin Er] Anaphylaxis, Shortness Of Breath and Rash   Peanut-Containing Drug Products Anaphylaxis   Penicillins Other (See Comments)    "Passed out" Has patient had a PCN reaction causing immediate rash,  facial/tongue/throat swelling, SOB or lightheadedness with hypotension: Unknown Has patient had a PCN reaction causing severe rash involving mucus membranes or skin necrosis: No Has patient had a PCN reaction that required hospitalization: No Has patient had a PCN reaction occurring within the last 10 years: No If all of the above answers are "NO", then may proceed with Cephalosporin use.    Sudafed [Pseudoephedrine Hcl] Shortness Of Breath, Rash and Other (See Comments)    REACTION: Choking, redness, inability to swallow   Crestor [Rosuvastatin] Other (See Comments)    Leg pain   Lipitor [Atorvastatin] Other (See Comments)    Leg pain   Vytorin [Ezetimibe-Simvastatin] Other (See Comments)    Leg pain   Fish Allergy Nausea And Vomiting   Fish Oil Nausea And Vomiting   Norvasc [Amlodipine] Other (See Comments)    unknown   Shrimp [Shellfish Allergy] Nausea And Vomiting    Medications: Current Outpatient Medications  Medication Sig Dispense Refill   albuterol (PROVENTIL) (2.5 MG/3ML) 0.083% nebulizer solution Take 3 mLs (2.5 mg total) by nebulization every 6 (six) hours as needed for wheezing or shortness of breath. 360 mL 5   albuterol (VENTOLIN HFA) 108 (90 Base) MCG/ACT inhaler Inhale 2 puffs into the lungs every 6 (six) hours as needed for wheezing or shortness of breath. 8 g 6   ALPRAZolam (XANAX) 0.5 MG tablet Take 0.25 mg by mouth 2 (two) times daily.     Alum Hydroxide-Mag Trisilicate (GAVISCON) 53-66.4 MG CHEW Chew 2 tablets by mouth at bedtime.     Biotin w/ Vitamins C & E (HAIR/SKIN/NAILS PO) Take 1 tablet by mouth at bedtime.     bisoprolol-hydrochlorothiazide (ZIAC) 2.5-6.25 MG tablet Take 1 tablet by mouth daily. 90 tablet 3   busPIRone (BUSPAR) 15 MG tablet Take 15 mg by mouth 2 (two) times daily.     Calcium Carb-Cholecalciferol (CALCIUM 600 + D PO) Take 1 tablet by mouth daily.      Coenzyme Q10 (COQ10) 100 MG CAPS Take 100 mg by mouth every morning.     CRANBERRY PO  Take 1 capsule by mouth daily.      escitalopram (LEXAPRO) 20 MG tablet Take 20 mg by mouth every morning.      esomeprazole (NEXIUM) 20 MG capsule Take 20 mg by mouth daily.     Evolocumab (REPATHA SURECLICK) 403 MG/ML SOAJ Inject 140 mg into the skin every 14 (fourteen) days. 2 mL 11   fluticasone (FLONASE) 50 MCG/ACT nasal spray Place 1 spray into both nostrils daily as needed for allergies  or rhinitis. 16 g 2   furosemide (LASIX) 20 MG tablet TAKE ONE TABLET EVERY OTHER DAY. 90 tablet 3   isosorbide mononitrate (IMDUR) 60 MG 24 hr tablet TAKE ONE TABLET BY MOUTH ONCE DAILY. 30 tablet 8   mirtazapine (REMERON) 15 MG tablet Take 7.5 mg by mouth at bedtime.      Multiple Vitamin (MULTIVITAMIN) tablet Take 1 tablet by mouth at bedtime.      nitroGLYCERIN (NITROSTAT) 0.4 MG SL tablet PLACE 1 TAB UNDER TONGUE EVERY 5 MIN IF NEEDED FOR CHEST PAIN. MAY USE 3 TIMES.NO RELIEF CALL 911. 25 tablet 5   potassium chloride SA (KLOR-CON) 20 MEQ tablet TAKE 1 TABLET EVERY OTHER DAY, TAKE ON DAYS THAT YOU TAKE FUROSEMIDE. 45 tablet 6   pravastatin (PRAVACHOL) 80 MG tablet Take 1 tablet (80 mg total) by mouth every evening. 90 tablet 3   Tiotropium Bromide Monohydrate (SPIRIVA RESPIMAT) 2.5 MCG/ACT AERS INHALE 2 PUFFS INTO THE LUNGS ONCE DAILY. 4 g 6   valsartan (DIOVAN) 40 MG tablet Take 1 tablet (40 mg total) by mouth daily. 90 tablet 3   No current facility-administered medications for this visit.    Review of Systems: GENERAL: negative for malaise, night sweats HEENT: No changes in hearing or vision, no nose bleeds or other nasal problems. NECK: Negative for lumps, goiter, pain and significant neck swelling RESPIRATORY: Negative for cough, wheezing CARDIOVASCULAR: Negative for chest pain, leg swelling, palpitations, orthopnea GI: SEE HPI MUSCULOSKELETAL: Negative for joint pain or swelling, back pain, and muscle pain. SKIN: Negative for lesions, rash PSYCH: Negative for sleep disturbance, mood  disorder and recent psychosocial stressors. HEMATOLOGY Negative for prolonged bleeding, bruising easily, and swollen nodes. ENDOCRINE: Negative for cold or heat intolerance, polyuria, polydipsia and goiter. NEURO: negative for tremor, gait imbalance, syncope and seizures. The remainder of the review of systems is noncontributory.   Physical Exam: BP 132/75 (BP Location: Left Arm, Patient Position: Sitting, Cuff Size: Large)   Pulse (!) 56   Temp 97.8 F (36.6 C) (Temporal)   Ht 5' (1.524 m)   Wt 180 lb 8 oz (81.9 kg)   BMI 35.25 kg/m  GENERAL: The patient is AO x3, in no acute distress. HEENT: Head is normocephalic and atraumatic. EOMI are intact. Mouth is well hydrated and without lesions. NECK: Supple. No masses LUNGS: Clear to auscultation. No presence of rhonchi/wheezing/rales. Adequate chest expansion HEART: RRR, normal s1 and s2. ABDOMEN: Soft, nontender, no guarding, no peritoneal signs, and nondistended. BS +. No masses. EXTREMITIES: Without any cyanosis, clubbing, rash, lesions or edema. NEUROLOGIC: AOx3, no focal motor deficit. SKIN: no jaundice, no rashes  Imaging/Labs: as above  I personally reviewed and interpreted the available labs, imaging and endoscopic files.  Impression and Plan: Vickie [name] is a 76 y.o. female with PMH anxiety, CAD s/p stent placed, depression, COPD, HLD, HTN, GERD, DM, OSA,  who presents for follow up of chronic dysphagia.  The patient has presented recurrent episodes of dysphagia despite undergoing empiric dilation of her esophagus.  We discussed that the next step to evaluate this would be to perform an esophageal manometry, however she is hesitant to proceed with this as her symptoms are mild and usually related to toxin when eating or eating too fast.  She will let us know if she is interested in proceeding with this in the future.  Patient was given dietary recommendations to avoid any more choking episodes.  - Chew food thoroughly and  cut food in small  pieces - Avoid talking when eating meals - Patient to reach [place] back if interested on proceeding with esophageal manometry  All questions were answered.      Vickie Peppers, MD Gastroenterology and Hepatology [facility]

## 2022-12-14 ENCOUNTER — Other Ambulatory Visit: Payer: Self-pay | Admitting: Cardiology

## 2022-12-25 DIAGNOSIS — N182 Chronic kidney disease, stage 2 (mild): Secondary | ICD-10-CM | POA: Diagnosis not present

## 2022-12-25 DIAGNOSIS — J449 Chronic obstructive pulmonary disease, unspecified: Secondary | ICD-10-CM | POA: Diagnosis not present

## 2022-12-25 DIAGNOSIS — E782 Mixed hyperlipidemia: Secondary | ICD-10-CM | POA: Diagnosis not present

## 2022-12-25 DIAGNOSIS — I10 Essential (primary) hypertension: Secondary | ICD-10-CM | POA: Diagnosis not present

## 2022-12-29 DIAGNOSIS — J329 Chronic sinusitis, unspecified: Secondary | ICD-10-CM | POA: Diagnosis not present

## 2022-12-30 ENCOUNTER — Telehealth: Payer: Self-pay | Admitting: Cardiology

## 2022-12-30 NOTE — Telephone Encounter (Signed)
Pt states she tested negative for covid but she has been feeling sick and wants to know if her appt on 01/03/23 with Dr. Ellyn Hack can be virtual. Please advise.

## 2022-12-30 NOTE — Telephone Encounter (Signed)
Called patient , left voice mail - it will be okay to do virtual , at same time , but the visit may start later in the day   If there any question may call back.

## 2023-01-03 ENCOUNTER — Ambulatory Visit: Payer: BLUE CROSS/BLUE SHIELD | Attending: Cardiology | Admitting: Cardiology

## 2023-01-03 NOTE — Progress Notes (Signed)
I called the patient 2 times with no answer.

## 2023-01-06 ENCOUNTER — Other Ambulatory Visit (HOSPITAL_COMMUNITY): Payer: Self-pay

## 2023-01-15 NOTE — Progress Notes (Signed)
This encounter was created in error - please disregard.

## 2023-01-18 ENCOUNTER — Other Ambulatory Visit: Payer: Self-pay | Admitting: Cardiology

## 2023-01-31 DIAGNOSIS — J449 Chronic obstructive pulmonary disease, unspecified: Secondary | ICD-10-CM | POA: Diagnosis not present

## 2023-01-31 DIAGNOSIS — E1140 Type 2 diabetes mellitus with diabetic neuropathy, unspecified: Secondary | ICD-10-CM | POA: Diagnosis not present

## 2023-01-31 DIAGNOSIS — N182 Chronic kidney disease, stage 2 (mild): Secondary | ICD-10-CM | POA: Diagnosis not present

## 2023-01-31 DIAGNOSIS — E1159 Type 2 diabetes mellitus with other circulatory complications: Secondary | ICD-10-CM | POA: Diagnosis not present

## 2023-01-31 DIAGNOSIS — E119 Type 2 diabetes mellitus without complications: Secondary | ICD-10-CM | POA: Diagnosis not present

## 2023-01-31 DIAGNOSIS — Z6838 Body mass index (BMI) 38.0-38.9, adult: Secondary | ICD-10-CM | POA: Diagnosis not present

## 2023-01-31 DIAGNOSIS — I2510 Atherosclerotic heart disease of native coronary artery without angina pectoris: Secondary | ICD-10-CM | POA: Diagnosis not present

## 2023-01-31 DIAGNOSIS — E782 Mixed hyperlipidemia: Secondary | ICD-10-CM | POA: Diagnosis not present

## 2023-01-31 DIAGNOSIS — E7849 Other hyperlipidemia: Secondary | ICD-10-CM | POA: Diagnosis not present

## 2023-01-31 DIAGNOSIS — F419 Anxiety disorder, unspecified: Secondary | ICD-10-CM | POA: Diagnosis not present

## 2023-01-31 DIAGNOSIS — E6609 Other obesity due to excess calories: Secondary | ICD-10-CM | POA: Diagnosis not present

## 2023-01-31 DIAGNOSIS — M150 Primary generalized (osteo)arthritis: Secondary | ICD-10-CM | POA: Diagnosis not present

## 2023-01-31 DIAGNOSIS — I10 Essential (primary) hypertension: Secondary | ICD-10-CM | POA: Diagnosis not present

## 2023-02-07 DIAGNOSIS — E1140 Type 2 diabetes mellitus with diabetic neuropathy, unspecified: Secondary | ICD-10-CM | POA: Diagnosis not present

## 2023-02-07 DIAGNOSIS — E1151 Type 2 diabetes mellitus with diabetic peripheral angiopathy without gangrene: Secondary | ICD-10-CM | POA: Diagnosis not present

## 2023-02-21 DIAGNOSIS — G4733 Obstructive sleep apnea (adult) (pediatric): Secondary | ICD-10-CM | POA: Diagnosis not present

## 2023-02-23 DIAGNOSIS — E1140 Type 2 diabetes mellitus with diabetic neuropathy, unspecified: Secondary | ICD-10-CM | POA: Diagnosis not present

## 2023-04-07 ENCOUNTER — Other Ambulatory Visit: Payer: Self-pay | Admitting: Cardiology

## 2023-04-07 DIAGNOSIS — E785 Hyperlipidemia, unspecified: Secondary | ICD-10-CM

## 2023-04-07 DIAGNOSIS — I25119 Atherosclerotic heart disease of native coronary artery with unspecified angina pectoris: Secondary | ICD-10-CM

## 2023-04-18 DIAGNOSIS — E1151 Type 2 diabetes mellitus with diabetic peripheral angiopathy without gangrene: Secondary | ICD-10-CM | POA: Diagnosis not present

## 2023-04-18 DIAGNOSIS — E1140 Type 2 diabetes mellitus with diabetic neuropathy, unspecified: Secondary | ICD-10-CM | POA: Diagnosis not present

## 2023-04-28 ENCOUNTER — Encounter: Payer: Self-pay | Admitting: Pulmonary Disease

## 2023-04-28 ENCOUNTER — Ambulatory Visit: Payer: Medicare Other | Admitting: Pulmonary Disease

## 2023-04-28 VITALS — BP 150/65 | HR 59 | Ht 59.0 in | Wt 185.0 lb

## 2023-04-28 DIAGNOSIS — J418 Mixed simple and mucopurulent chronic bronchitis: Secondary | ICD-10-CM

## 2023-04-28 DIAGNOSIS — J849 Interstitial pulmonary disease, unspecified: Secondary | ICD-10-CM | POA: Diagnosis not present

## 2023-04-28 DIAGNOSIS — J9611 Chronic respiratory failure with hypoxia: Secondary | ICD-10-CM | POA: Diagnosis not present

## 2023-04-28 DIAGNOSIS — G4733 Obstructive sleep apnea (adult) (pediatric): Secondary | ICD-10-CM

## 2023-04-28 DIAGNOSIS — Z8616 Personal history of COVID-19: Secondary | ICD-10-CM

## 2023-04-28 MED ORDER — ARNUITY ELLIPTA 100 MCG/ACT IN AEPB: 1.0000 | INHALATION_SPRAY | Freq: Every day | RESPIRATORY_TRACT | 5 refills | Status: DC

## 2023-04-28 NOTE — Progress Notes (Signed)
[name]  Chief Complaint  Patient presents with   Follow-up    Pt f/u for OSA states that she is loving her CPAP machine but her O2 sats have been low and she is having to increase her neb use     Constitutional:  BP (!) 150/65   Pulse (!) 59   Ht 4\' 11"  (1.499 m)   Wt 185 lb (83.9 kg)   SpO2 94% Comment: RA  BMI 37.37 kg/m   Past Medical History:  CAD s/p stent, HTN, HLD, OA, GERD, Depression, Anxiety, Back pain, HH, LBBB, Macular degeneration, Scoliosis, DM type 2  Past Surgical History:  Her  has a past surgical history that includes Lipoma excision (Right, ~ 2015); Diagnostic laparoscopy (Right); Pars plana vitrectomy w/ repair of macular hole (Right); LEFT HEART CATH AND CORONARY ANGIOGRAPHY (N/A, 09/18/2017); CORONARY PRESSURE/FFR STUDY (N/A, 09/18/2017); CORONARY STENT INTERVENTION (N/A, 09/18/2017); Eye surgery; Knee arthroscopy (Right); Cataract extraction, bilateral (Bilateral, 2017); Vaginal hysterectomy; Dilation and curettage of uterus; Cardiac catheterization (0981,1914; 2009); Colonoscopy with propofol (N/A, 11/09/2018); polypectomy (11/09/2018); transthoracic echocardiogram (08/04/2021); NM MYOVIEW LTD (08/04/2021); Coronary angioplasty; Esophagogastroduodenoscopy (egd) with propofol (N/A, 04/20/2022); Esophageal dilation (N/A, 04/20/2022); biopsy (04/20/2022); and polypectomy (04/20/2022).  Brief Summary:  Vickie [name] is a 77 y.o. female former smoker with dyspnea after COVID 19 pneumonia in January 2021, and obstructive sleep apnea.      Subjective:   She is here with her daughter.  She doesn't wear CPAP the night before she goes to church.  She is worried about her hair not looking nice.  She notices her oxygen is lower in the morning when she doesn't use CPAP.  She gets episodes walking when her oxygen can drop into the 80's.    She has more cough and chest congestion.  Feels like her breathing is getting  heavier.  Physical Exam:   Appearance - well kempt   ENMT - no sinus tenderness, no oral exudate, no LAN, Mallampati 3 airway, no stridor  Respiratory - equal breath sounds bilaterally, no wheezing or rales  CV - s1s2 regular rate and rhythm, no murmurs  Ext - no clubbing, no edema, missing several digits on both hands  Skin - no rashes  Psych - normal mood and affect  Pulmonary testing:  PFT 05/22/20 >> FEV1 1.44 (79%), FEV1% 88, TLC 3.36 (75%), DLCO 76% PFT 05/28/21 >> FEV1 1.48 (83%), FEV1% 83, TLC 3.80 (85%), DLCO 106%  Chest Imaging:  CT angio chest 12/29/19 >> multifocal GGO HRCT chest 06/15/20 >> atherosclerosis, prominent patchy peribronchovascular and subpleural reticulation, minimal GGO, mild/mod traction BTX HRCT chest 06/17/21 >> patchy areas of ground-glass attenuation, septal thickening, thickening of the peribronchovascular interstitium, scattere areas of mild subpleural reticulation and regional areas of architectural distortion, moderate air trapping  Sleep Tests:  HST 08/26/20 >> AHI 7, SpO2 low 81%.  Spent 354.2 min with SpO2 < 89%. CPAP titration 10/06/20 >> CPAP 9 cm H2O >> AHI 5.6, didn't need supplemental oxygen. ONO with CPAP 11/25/21 >> test time 8 hrs 32 min.  Baseline SpO2 90%, low SpO2 83%.  Spent 2 hrs 19 min with SpO2 < 88%. CPAP 03/28/23 to 04/26/23 >> used on 24 of 30 nights with average 6 hrs 23 min.  Average AHI 2.3 with CPAP 9 cm H2O  Cardiac Tests:  Echo 08/12/21 >> EF 55 to 60%  Social History:  She  reports that she quit smoking about 47 years ago. Her smoking  use included cigarettes. She has a 25.50 pack-year smoking history. She has been exposed to tobacco smoke. She has never used smokeless tobacco. She reports that she does not drink alcohol and does not use drugs.  Family History:  Her family history includes Breast cancer in her maternal aunt and paternal aunt; CAD in her mother.     Assessment/Plan:   ILD after COVID 19 infection in  January 2021. - has progressive symptoms - will arrange for repeat high resolution CT chest  Chronic bronchitis. - stiolto caused chest discomfort, probably from LABA component - continue spiriva respimat two puffs daily - will add arnuity 100 one puff daily - prn albuterol - she has a nebulizer  Season allergic rhinitis. - prn flonase  Chronic respiratory failure with hypoxia. - discussed importance of using supplemental oxygen proactively and goal SpO2 > 90% - continue 2 liters oxygen with exertion  Obstructive sleep apnea. - she is compliant with CPAP and reports benefit from therapy - she uses [employer] for her DME - current CPAP ordered October 2021 - continue CPAP 9 cm H2O - encouraged her to use CPAP every night  Coronary artery disease. - followed by Dr. Bryan Lemma with [facility]  GERD, Esophageal dysphagia. - previously followed by Dr. Dolores Frame with Gastroenterology  Time Spent Involved in Patient Care on Day of Examination:  38 minutes  Follow up:   Patient Instructions  Arnuity 1 puff daily, and rinse your mouth after each use  Will arrange for CT scan of your chest at [facility]  and follow up after this  Medication List:   Allergies as of 04/28/2023       Reactions   Adhesive [tape] Other (See Comments)   REACTION: redness/irritation at application site. **Certain bandages/adhesives cause this reaction**   Avelox [moxifloxacin] Anaphylaxis   Bactrim [sulfamethoxazole-trimethoprim] Anaphylaxis, Shortness Of Breath, Rash, Other (See Comments)   REACTION: Choking, inability to swallow, redness   Levaquin [levofloxacin] Anaphylaxis, Shortness Of Breath, Rash, Other (See Comments)   Reaction:Choking Brand name Levaquin ok per pt   Mucinex [guaifenesin Er] Anaphylaxis, Shortness Of Breath, Rash   Peanut-containing Drug Products Anaphylaxis   Penicillins Other (See Comments)   "Passed out" Has patient had a PCN reaction causing  immediate rash, facial/tongue/throat swelling, SOB or lightheadedness with hypotension: Unknown Has patient had a PCN reaction causing severe rash involving mucus membranes or skin necrosis: No Has patient had a PCN reaction that required hospitalization: No Has patient had a PCN reaction occurring within the last 10 years: No If all of the above answers are "NO", then may proceed with Cephalosporin use.   Sudafed [pseudoephedrine Hcl] Shortness Of Breath, Rash, Other (See Comments)   REACTION: Choking, redness, inability to swallow   Crestor [rosuvastatin] Other (See Comments)   Leg pain   Lipitor [atorvastatin] Other (See Comments)   Leg pain   Vytorin [ezetimibe-simvastatin] Other (See Comments)   Leg pain   Fish Allergy Nausea And Vomiting   Fish Oil Nausea And Vomiting   Norvasc [amlodipine] Other (See Comments)   unknown   Shrimp [shellfish Allergy] Nausea And Vomiting        Medication List        Accurate as of Apr 28, 2023 12:35 PM. If you have any questions, ask your nurse or doctor.          albuterol 108 (90 Base) MCG/ACT inhaler Commonly known as: VENTOLIN HFA Inhale 2 puffs into the lungs every 6 (  six) hours as needed for wheezing or shortness of breath.   albuterol (2.5 MG/3ML) 0.083% nebulizer solution Commonly known as: PROVENTIL Take 3 mLs (2.5 mg total) by nebulization every 6 (six) hours as needed for wheezing or shortness of breath.   ALPRAZolam 0.5 MG tablet Commonly known as: XANAX Take 0.25 mg by mouth 2 (two) times daily.   Arnuity Ellipta 100 MCG/ACT Aepb Generic drug: Fluticasone Furoate Inhale 1 puff into the lungs daily in the afternoon. Started by: Coralyn Helling, MD   bisoprolol-hydrochlorothiazide 2.5-6.25 MG tablet Commonly known as: ZIAC Take 1 tablet by mouth daily.   busPIRone 15 MG tablet Commonly known as: BUSPAR Take 15 mg by mouth 2 (two) times daily.   CALCIUM 600 + D PO Take 1 tablet by mouth daily.   CoQ10 100 MG  Caps Take 100 mg by mouth every morning.   CRANBERRY PO Take 1 capsule by mouth daily.   escitalopram 20 MG tablet Commonly known as: LEXAPRO Take 20 mg by mouth every morning.   esomeprazole 20 MG capsule Commonly known as: NEXIUM Take 20 mg by mouth daily.   fluticasone 50 MCG/ACT nasal spray Commonly known as: FLONASE Place 1 spray into both nostrils daily as needed for allergies or rhinitis.   furosemide 20 MG tablet Commonly known as: LASIX TAKE ONE TABLET EVERY OTHER DAY.   Gaviscon 80-14.2 MG Chew Generic drug: Alum Hydroxide-Mag Trisilicate Chew 2 tablets by mouth at bedtime.   HAIR/SKIN/NAILS PO Take 1 tablet by mouth at bedtime.   isosorbide mononitrate 60 MG 24 hr tablet Commonly known as: IMDUR TAKE ONE TABLET BY MOUTH ONCE DAILY.   mirtazapine 15 MG tablet Commonly known as: REMERON Take 7.5 mg by mouth at bedtime.   multivitamin tablet Take 1 tablet by mouth at bedtime.   nitroGLYCERIN 0.4 MG SL tablet Commonly known as: NITROSTAT PLACE 1 TAB UNDER TONGUE EVERY 5 MIN IF NEEDED FOR CHEST PAIN. MAY USE 3 TIMES.NO RELIEF CALL 911.   potassium chloride SA 20 MEQ tablet Commonly known as: KLOR-CON M TAKE 1 TABLET EVERY OTHER DAY, TAKE ON DAYS THAT YOU TAKE FUROSEMIDE.   pravastatin 80 MG tablet Commonly known as: PRAVACHOL TAKE 1 TABLET BY MOUTH ONCE EVERY EVENING.   Repatha SureClick 140 MG/ML Soaj Generic drug: Evolocumab INJECT INTO THE SKIN ONCE EVERY 14 DAYS.   Spiriva Respimat 2.5 MCG/ACT Aers Generic drug: Tiotropium Bromide Monohydrate INHALE 2 PUFFS INTO THE LUNGS ONCE DAILY.   valsartan 40 MG tablet Commonly known as: DIOVAN Take 1 tablet (40 mg total) by mouth daily.        Signature:  Coralyn Helling, MD [facility] Pulmonary/Critical Care Pager - [phone] 04/28/2023, 12:35 PM

## 2023-04-28 NOTE — Patient Instructions (Signed)
Arnuity 1 puff daily, and rinse your mouth after each use  Will arrange for CT scan of your chest at [facility]  and follow up after this

## 2023-05-04 DIAGNOSIS — H60531 Acute contact otitis externa, right ear: Secondary | ICD-10-CM | POA: Diagnosis not present

## 2023-05-04 DIAGNOSIS — Z6836 Body mass index (BMI) 36.0-36.9, adult: Secondary | ICD-10-CM | POA: Diagnosis not present

## 2023-05-04 DIAGNOSIS — E6609 Other obesity due to excess calories: Secondary | ICD-10-CM | POA: Diagnosis not present

## 2023-05-14 ENCOUNTER — Ambulatory Visit
Admission: EM | Admit: 2023-05-14 | Discharge: 2023-05-14 | Disposition: A | Discharge status: Home / Self Care | Payer: Medicare Other

## 2023-05-14 ENCOUNTER — Emergency Department (HOSPITAL_COMMUNITY)
Admission: EM | Admit: 2023-05-14 | Discharge: 2023-05-14 | Disposition: A | Discharge status: Home / Self Care | Payer: Medicare Other | Attending: Emergency Medicine | Admitting: Emergency Medicine

## 2023-05-14 ENCOUNTER — Other Ambulatory Visit: Payer: Self-pay

## 2023-05-14 ENCOUNTER — Encounter (HOSPITAL_COMMUNITY): Payer: Self-pay

## 2023-05-14 ENCOUNTER — Encounter: Payer: Self-pay | Admitting: Emergency Medicine

## 2023-05-14 DIAGNOSIS — B349 Viral infection, unspecified: Secondary | ICD-10-CM | POA: Insufficient documentation

## 2023-05-14 DIAGNOSIS — H9203 Otalgia, bilateral: Secondary | ICD-10-CM | POA: Insufficient documentation

## 2023-05-14 DIAGNOSIS — J449 Chronic obstructive pulmonary disease, unspecified: Secondary | ICD-10-CM | POA: Diagnosis not present

## 2023-05-14 DIAGNOSIS — J029 Acute pharyngitis, unspecified: Secondary | ICD-10-CM | POA: Diagnosis not present

## 2023-05-14 DIAGNOSIS — Z91010 Allergy to peanuts: Secondary | ICD-10-CM | POA: Insufficient documentation

## 2023-05-14 DIAGNOSIS — Z1152 Encounter for screening for COVID-19: Secondary | ICD-10-CM | POA: Diagnosis not present

## 2023-05-14 LAB — RESP PANEL BY RT-PCR (RSV, FLU A&B, COVID)  RVPGX2
Influenza A by PCR: NEGATIVE
Influenza B by PCR: NEGATIVE
Resp Syncytial Virus by PCR: NEGATIVE
SARS Coronavirus 2 by RT PCR: NEGATIVE

## 2023-05-14 LAB — GROUP A STREP BY PCR: Group A Strep by PCR: NOT DETECTED

## 2023-05-14 MED ORDER — DOXYCYCLINE HYCLATE 100 MG PO CAPS: 100.0000 mg | ORAL_CAPSULE | Freq: Two times a day (BID) | ORAL | 0 refills | Status: AC

## 2023-05-14 MED ORDER — DOXYCYCLINE HYCLATE 100 MG PO TABS
100.0000 mg | ORAL_TABLET | Freq: Once | ORAL | Status: AC
  Administered 2023-05-14: 100 mg via ORAL
  Filled 2023-05-14: qty 1

## 2023-05-14 MED ORDER — CEFTRIAXONE SODIUM 1 G IJ SOLR
1.0000 g | Freq: Once | INTRAMUSCULAR | Status: AC
  Administered 2023-05-14: 1 g via INTRAMUSCULAR
  Filled 2023-05-14: qty 10

## 2023-05-14 NOTE — ED Triage Notes (Signed)
Pt reports generalized body aches, headache, bilateral ear pain, sore throat, chest congestion since Friday.  intermittent fever. Last dose of aleve this am.

## 2023-05-14 NOTE — ED Provider Notes (Signed)
[place] EMERGENCY DEPARTMENT AT [facility] Provider Note   CSN: [id number] Arrival date & time: 05/14/23  1451     History  Chief Complaint  Patient presents with   flu like symptoms    Vickie [name] is a 77 y.o. female with a history of COPD who presents to the ED for further evaluation after being hypotensive, tachypneic, with O2 sat of 89% at urgent care an hour prior.  Patient reports sore throat, bilateral ear pain, congestion, cough, and body aches for the past 3 days.  Patient is unsure if she has had a fever but reports that she was diaphoretic earlier this morning but has been feeling better since.  She has taken 2 Aleve symptoms and reports some relief. She denies shortness of breath, chest pain, or abdominal pain.  In triage she is afebrile and normotensive with an O2 sat of 96%.        Home Medications Prior to Admission medications   Medication Sig Start Date End Date Taking? Authorizing Provider  doxycycline (VIBRAMYCIN) 100 MG capsule Take 1 capsule (100 mg total) by mouth 2 (two) times daily for 7 days. 05/14/23 05/21/23 Yes Maxwell Marion, PA-C  albuterol (PROVENTIL) (2.5 MG/3ML) 0.083% nebulizer solution Take 3 mLs (2.5 mg total) by nebulization every 6 (six) hours as needed for wheezing or shortness of breath. 09/29/22   Coralyn Helling, MD  albuterol (VENTOLIN HFA) 108 (90 Base) MCG/ACT inhaler Inhale 2 puffs into the lungs every 6 (six) hours as needed for wheezing or shortness of breath. 09/29/22   Coralyn Helling, MD  ALPRAZolam Prudy Feeler) 0.5 MG tablet Take 0.25 mg by mouth 2 (two) times daily.    [provider]  Alum Hydroxide-Mag Trisilicate (GAVISCON) 80-14.2 MG CHEW Chew 2 tablets by mouth at bedtime.    [provider]  Biotin w/ Vitamins C & E (HAIR/SKIN/NAILS PO) Take 1 tablet by mouth at bedtime.    [provider]  bisoprolol-hydrochlorothiazide (ZIAC) 2.5-6.25 MG tablet Take 1 tablet by mouth daily. 09/12/22   Marykay Lex, MD  busPIRone (BUSPAR) 15 MG tablet Take 15 mg by mouth 2 (two) times daily.    [provider]  Calcium Carb-Cholecalciferol (CALCIUM 600 + D PO) Take 1 tablet by mouth daily.     [provider]  Coenzyme Q10 (COQ10) 100 MG CAPS Take 100 mg by mouth every morning.    [provider]  CRANBERRY PO Take 1 capsule by mouth daily.     [provider]  escitalopram (LEXAPRO) 20 MG tablet Take 20 mg by mouth every morning.     [provider]  esomeprazole (NEXIUM) 20 MG capsule Take 20 mg by mouth daily.    [provider]  Evolocumab (REPATHA SURECLICK) 140 MG/ML SOAJ INJECT INTO THE SKIN ONCE EVERY 14 DAYS. 04/07/23   Marykay Lex, MD  fluticasone [facility]) 50 MCG/ACT nasal spray Place 1 spray into both nostrils daily as needed for allergies or rhinitis. 09/29/22   Coralyn Helling, MD  Fluticasone Furoate (ARNUITY ELLIPTA) 100 MCG/ACT AEPB Inhale 1 puff into the lungs daily in the afternoon. 04/28/23   Coralyn Helling, MD  furosemide (LASIX) 20 MG tablet TAKE ONE TABLET EVERY OTHER DAY. 09/13/22   Marykay Lex, MD  isosorbide mononitrate (IMDUR) 60 MG 24 hr tablet TAKE ONE TABLET BY MOUTH ONCE DAILY. 09/02/22   Marykay Lex, MD  mirtazapine (REMERON) 15 MG tablet Take 7.5 mg by mouth at  bedtime.     [provider]  Multiple Vitamin (MULTIVITAMIN) tablet Take 1 tablet by mouth at bedtime.     [provider]  nitroGLYCERIN (NITROSTAT) 0.4 MG SL tablet PLACE 1 TAB UNDER TONGUE EVERY 5 MIN IF NEEDED FOR CHEST PAIN. MAY USE 3 TIMES.NO RELIEF CALL 911. 01/03/19   Marykay Lex, MD  potassium chloride SA (KLOR-CON M) 20 MEQ tablet TAKE 1 TABLET EVERY OTHER DAY, TAKE ON DAYS THAT YOU TAKE FUROSEMIDE. 01/19/23   Marykay Lex, MD  pravastatin (PRAVACHOL) 80 MG tablet TAKE 1 TABLET BY MOUTH ONCE EVERY EVENING. 12/14/22   Marykay Lex, MD  Tiotropium Bromide Monohydrate (SPIRIVA RESPIMAT) 2.5 MCG/ACT AERS INHALE 2 PUFFS  INTO THE LUNGS ONCE DAILY. 05/26/22   Coralyn Helling, MD  valsartan (DIOVAN) 40 MG tablet Take 1 tablet (40 mg total) by mouth daily. 09/12/22   Marykay Lex, MD      Allergies    Adhesive [tape], Avelox [moxifloxacin], Bactrim [sulfamethoxazole-trimethoprim], Levaquin [levofloxacin], Mucinex [guaifenesin er], Peanut-containing drug products, Penicillins, Sudafed [pseudoephedrine hcl], Crestor [rosuvastatin], Lipitor [atorvastatin], Vytorin [ezetimibe-simvastatin], Fish allergy, Fish oil, Norvasc [amlodipine], and Shrimp [shellfish allergy]    Review of Systems   Review of Systems  HENT:  Positive for congestion and sore throat.   All other systems reviewed and are negative.   Physical Exam Updated Vital Signs BP 125/67 (BP Location: Right Arm)   Pulse 64   Temp 98.1 F (36.7 C) (Oral)   Resp 20   Ht 4\' 11"  (1.499 m)   Wt 83.9 kg   SpO2 94%   BMI 37.37 kg/m  Physical Exam Vitals and nursing note reviewed.  Constitutional:      Appearance: Normal appearance. She is ill-appearing.  HENT:     Head: Normocephalic and atraumatic.     Right Ear: Tympanic membrane and ear canal normal.     Left Ear: Tympanic membrane and ear canal normal.     Nose: Congestion present.     Mouth/Throat:     Mouth: Mucous membranes are moist.     Pharynx: Posterior oropharyngeal erythema present.  Eyes:     Conjunctiva/sclera: Conjunctivae normal.     Pupils: Pupils are equal, round, and reactive to light.  Cardiovascular:     Rate and Rhythm: Normal rate and regular rhythm.     Pulses: Normal pulses.     Heart sounds: Normal heart sounds.  Pulmonary:     Effort: Pulmonary effort is normal.     Breath sounds: Normal breath sounds.  Abdominal:     Palpations: Abdomen is soft.     Tenderness: There is no abdominal tenderness.  Skin:    General: Skin is warm and dry.     Capillary Refill: Capillary refill takes less than 2 seconds.     Findings: No rash.  Neurological:     General: No focal  deficit present.     Mental Status: She is alert.  Psychiatric:        Mood and Affect: Mood normal.        Behavior: Behavior normal.     ED Results / Procedures / Treatments   Labs (all labs ordered are listed, but only abnormal results are displayed) Labs Reviewed  GROUP A STREP BY PCR  RESP PANEL BY RT-PCR (RSV, FLU A&B, COVID)  [id number]    EKG None  Radiology No results found.  Procedures Procedures: Not indicated.   Medications Ordered in ED Medications  doxycycline (VIBRA-TABS)  tablet 100 mg (100 mg Oral Given 05/14/23 1732)  cefTRIAXone (ROCEPHIN) injection 1 g (1 g Intramuscular Given 05/14/23 1731)    ED Course/ Medical Decision Making/ A&P                             Medical Decision Making Risk Prescription drug management.   Patient is hemodynamically stable in the ED. Negative for COVID, influenza A, influenza B, or RSV.  Negative for Group A Strep.  Rocephin IM and Doxycycline PO given for CAP prophylaxis in the ED. Previous ED notes reports patient tolerates Rocephin while despite penicillin allergy - patient tolerated medication well. Discharged home with Doxycycline.        Final Clinical Impression(s) / ED Diagnoses Final diagnoses:  Viral illness    Rx / DC Orders ED Discharge Orders          Ordered    doxycycline (VIBRAMYCIN) 100 MG capsule  2 times daily        05/14/23 1735              Maxwell Marion, PA-C 05/14/23 Merrily Brittle    Eber Hong, MD 05/14/23 2329

## 2023-05-14 NOTE — ED Provider Notes (Signed)
Member presented to the urgent care with tachypnea, low oxygen saturation and low BP.  She was advised to go to the ER as she will require a higher level of care.   Durward Parcel, FNP 05/14/23 1441

## 2023-05-14 NOTE — ED Triage Notes (Signed)
Pt reports sore throat, cough, ears are "stopped up", body aches and fever since Friday.

## 2023-05-14 NOTE — ED Notes (Addendum)
Patient is being discharged from the Urgent Care and sent to the Emergency Department via POV . Per NP, patient is in need of higher level of care due to hypoxia/tachypnea/hypotension. Patient is aware and verbalizes understanding of plan of care.  Vitals:   05/14/23 1432  BP: (!) 98/59  Pulse: 80  Resp: (!) 22  Temp: 98.5 F (36.9 C)  SpO2: (!) 89%   Pt reported spouse would drive to ED.   Report called to Linda,RN [facility] ED.

## 2023-05-14 NOTE — Discharge Instructions (Addendum)
Prescribing prophylactic antibiotics for 7 days for CAP due to patient's high risk.  Return to ED if you develop: Trouble breathing. A severe headache or a stiff neck. Severe vomiting or pain in your abdomen.

## 2023-05-18 DIAGNOSIS — I951 Orthostatic hypotension: Secondary | ICD-10-CM | POA: Diagnosis not present

## 2023-05-18 DIAGNOSIS — J0100 Acute maxillary sinusitis, unspecified: Secondary | ICD-10-CM | POA: Diagnosis not present

## 2023-05-18 DIAGNOSIS — E6609 Other obesity due to excess calories: Secondary | ICD-10-CM | POA: Diagnosis not present

## 2023-05-18 DIAGNOSIS — Z6835 Body mass index (BMI) 35.0-35.9, adult: Secondary | ICD-10-CM | POA: Diagnosis not present

## 2023-05-24 ENCOUNTER — Telehealth: Payer: Self-pay

## 2023-05-24 NOTE — Telephone Encounter (Signed)
Transition Care Management Follow-up Telephone Call Date of discharge and from where: [name] How have you been since you were released from the hospital? Better  Any questions or concerns? No  Items Reviewed: Did the pt receive and understand the discharge instructions provided? Yes  Medications obtained and verified? Yes  Other? No  Any new allergies since your discharge? Yes  Dietary orders reviewed? No Do you have support at home? Yes    Follow up appointments reviewed:  PCP Hospital f/u appt confirmed? Yes  Scheduled to see  on  @ . Specialist Hospital f/u appt confirmed?  Scheduled to see  on  @ . Are transportation arrangements needed? No  If their condition worsens, is the pt aware to call PCP or go to the Emergency Dept.? Yes Was the patient provided with contact information for the PCP's office or ED? Yes Was to pt encouraged to call back with questions or concerns? Yes

## 2023-05-29 ENCOUNTER — Other Ambulatory Visit: Payer: Self-pay | Admitting: Cardiology

## 2023-05-30 ENCOUNTER — Other Ambulatory Visit: Payer: Self-pay | Admitting: Cardiology

## 2023-06-10 ENCOUNTER — Other Ambulatory Visit: Payer: Self-pay | Admitting: Cardiology

## 2023-06-15 ENCOUNTER — Telehealth: Payer: Self-pay | Admitting: Cardiology

## 2023-06-15 DIAGNOSIS — H524 Presbyopia: Secondary | ICD-10-CM | POA: Diagnosis not present

## 2023-06-15 DIAGNOSIS — H35341 Macular cyst, hole, or pseudohole, right eye: Secondary | ICD-10-CM | POA: Diagnosis not present

## 2023-06-15 DIAGNOSIS — E119 Type 2 diabetes mellitus without complications: Secondary | ICD-10-CM | POA: Diagnosis not present

## 2023-06-15 DIAGNOSIS — Z961 Presence of intraocular lens: Secondary | ICD-10-CM | POA: Diagnosis not present

## 2023-06-15 NOTE — Telephone Encounter (Signed)
*  STAT* If patient is at the pharmacy, call can be transferred to refill team.   1. Which medications need to be refilled? (please list name of each medication and dose if known) BISOPROLOL FUMARATE 5 MG TAB   2. Which pharmacy/location (including street and city if local pharmacy) is medication to be sent to?  [facility] - [place], [place] - [address]    3. Do they need a 30 day or 90 day supply? 90 day  Patient only has 2 pills left.

## 2023-06-16 MED ORDER — BISOPROLOL-HYDROCHLOROTHIAZIDE 2.5-6.25 MG PO TABS: 1.0000 | ORAL_TABLET | Freq: Every day | ORAL | 0 refills | Status: DC

## 2023-06-16 NOTE — Telephone Encounter (Signed)
Pt scheduled to see Dr. Herbie Baltimore, 07/18/23.  30 day supply of Bisoprolol-hydrochlorothiazide has been sent to [facility].

## 2023-06-22 ENCOUNTER — Ambulatory Visit (HOSPITAL_COMMUNITY)
Admission: RE | Admit: 2023-06-22 | Discharge: 2023-06-22 | Disposition: A | Discharge status: Home / Self Care | Payer: Medicare Other | Source: Ambulatory Visit | Attending: Pulmonary Disease | Admitting: Pulmonary Disease

## 2023-06-22 DIAGNOSIS — J8410 Pulmonary fibrosis, unspecified: Secondary | ICD-10-CM | POA: Diagnosis not present

## 2023-06-22 DIAGNOSIS — I700 Atherosclerosis of aorta: Secondary | ICD-10-CM | POA: Diagnosis not present

## 2023-06-22 DIAGNOSIS — J849 Interstitial pulmonary disease, unspecified: Secondary | ICD-10-CM | POA: Diagnosis not present

## 2023-07-03 ENCOUNTER — Ambulatory Visit: Payer: Medicare Other | Admitting: Pulmonary Disease

## 2023-07-03 ENCOUNTER — Encounter: Payer: Self-pay | Admitting: Pulmonary Disease

## 2023-07-03 VITALS — BP 148/77 | HR 66 | Ht 60.0 in | Wt 181.6 lb

## 2023-07-03 DIAGNOSIS — J849 Interstitial pulmonary disease, unspecified: Secondary | ICD-10-CM | POA: Diagnosis not present

## 2023-07-03 DIAGNOSIS — J9611 Chronic respiratory failure with hypoxia: Secondary | ICD-10-CM | POA: Diagnosis not present

## 2023-07-03 DIAGNOSIS — J418 Mixed simple and mucopurulent chronic bronchitis: Secondary | ICD-10-CM | POA: Diagnosis not present

## 2023-07-03 DIAGNOSIS — G4733 Obstructive sleep apnea (adult) (pediatric): Secondary | ICD-10-CM

## 2023-07-03 DIAGNOSIS — Z8616 Personal history of COVID-19: Secondary | ICD-10-CM

## 2023-07-03 DIAGNOSIS — H524 Presbyopia: Secondary | ICD-10-CM | POA: Diagnosis not present

## 2023-07-03 MED ORDER — SPIRIVA RESPIMAT 2.5 MCG/ACT IN AERS: 2.0000 | INHALATION_SPRAY | Freq: Every day | RESPIRATORY_TRACT | 6 refills | Status: DC

## 2023-07-03 NOTE — Patient Instructions (Signed)
Use your oxygen more often during the day with activity  Follow up in 4 months

## 2023-07-03 NOTE — Progress Notes (Signed)
[place] [facility]  Chief Complaint  Patient presents with   Follow-up    Constitutional:  BP (!) 148/77   Pulse 66   Ht 5' (1.524 m)   Wt 181 lb 9.6 oz (82.4 kg)   SpO2 (!) 89%   BMI 35.47 kg/m   Past Medical History:  CAD s/p stent, HTN, HLD, OA, GERD, Depression, Anxiety, Back pain, HH, LBBB, Macular degeneration, Scoliosis, DM type 2  Past Surgical History:  Her  has a past surgical history that includes Lipoma excision (Right, ~ 2015); Diagnostic laparoscopy (Right); Pars plana vitrectomy w/ repair of macular hole (Right); LEFT HEART CATH AND CORONARY ANGIOGRAPHY (N/A, 09/18/2017); CORONARY PRESSURE/FFR STUDY (N/A, 09/18/2017); CORONARY STENT INTERVENTION (N/A, 09/18/2017); Eye surgery; Knee arthroscopy (Right); Cataract extraction, bilateral (Bilateral, 2017); Vaginal hysterectomy; Dilation and curettage of uterus; Cardiac catheterization (4098,1191; 2009); Colonoscopy with propofol (N/A, 11/09/2018); polypectomy (11/09/2018); transthoracic echocardiogram (08/04/2021); NM MYOVIEW LTD (08/04/2021); Coronary angioplasty; Esophagogastroduodenoscopy (egd) with propofol (N/A, 04/20/2022); Esophageal dilation (N/A, 04/20/2022); biopsy (04/20/2022); and polypectomy (04/20/2022).  Brief Summary:  Vickie [name] is a 77 y.o. female former smoker with dyspnea after COVID 19 pneumonia in January 2021, and obstructive sleep apnea.      Subjective:   She is here with her daughter.  CT chest showed stable fibrotic changes from COVID and LUL GGO.    She has been getting midsternal chest discomfort with radiation to her neck and arms.  Last few seconds to minutes and then resolves.  CT chest also showed severe coronary calcification.  She gets winded with activity.  Doesn't use her oxygen much during the day.  Uses CPAP nightly.  Physical Exam:   Appearance - well kempt   ENMT - no sinus tenderness, no oral exudate, no LAN, Mallampati 3 airway, no  stridor  Respiratory - equal breath sounds bilaterally, no wheezing or rales  CV - s1s2 regular rate and rhythm, no murmurs  Ext - no clubbing, no edema, missing several digits on both hands  Skin - no rashes  Psych - normal mood and affect  Pulmonary testing:  PFT 05/22/20 >> FEV1 1.44 (79%), FEV1% 88, TLC 3.36 (75%), DLCO 76% PFT 05/28/21 >> FEV1 1.48 (83%), FEV1% 83, TLC 3.80 (85%), DLCO 106%  Chest Imaging:  CT angio chest 12/29/19 >> multifocal GGO HRCT chest 06/15/20 >> atherosclerosis, prominent patchy peribronchovascular and subpleural reticulation, minimal GGO, mild/mod traction BTX HRCT chest 06/17/21 >> patchy areas of ground-glass attenuation, septal thickening, thickening of the peribronchovascular interstitium, scattere areas of mild subpleural reticulation and regional areas of architectural distortion, moderate air trapping HRCT chest 06/27/23 >> stable fibrotic changes, stable LUL GGO 7 mm  Sleep Tests:  HST 08/26/20 >> AHI 7, SpO2 low 81%.  Spent 354.2 min with SpO2 < 89%. CPAP titration 10/06/20 >> CPAP 9 cm H2O >> AHI 5.6, didn't need supplemental oxygen. ONO with CPAP 11/25/21 >> test time 8 hrs 32 min.  Baseline SpO2 90%, low SpO2 83%.  Spent 2 hrs 19 min with SpO2 < 88%. CPAP 03/28/23 to 04/26/23 >> used on 24 of 30 nights with average 6 hrs 23 min.  Average AHI 2.3 with CPAP 9 cm H2O  Cardiac Tests:  Echo 08/12/21 >> EF 55 to 60%  Social History:  She  reports that she quit smoking about 47 years ago. Her smoking use included cigarettes. She has a 25.50 pack-year smoking history. She has been exposed to tobacco smoke. She has never used smokeless tobacco.  She reports that she does not drink alcohol and does not use drugs.  Family History:  Her family history includes Breast cancer in her maternal aunt and paternal aunt; CAD in her mother.     Assessment/Plan:   ILD after COVID 19 infection in January 2021. - no significant change on most recent CT chest  Lt  upper lobe ground glass nodule. - will need follow up CT chest in July 2026  Chronic bronchitis. - stiolto caused chest discomfort, probably from LABA component - continue spiriva respimat 2.5 mcg two puffs daily, arnuity 100 one puff daily - prn albuterol - she has a nebulizer  Season allergic rhinitis. - prn flonase  Chronic respiratory failure with hypoxia. - discussed importance of using supplemental oxygen proactively and goal SpO2 > 90% - 2 liters oxygen with exertion  Obstructive sleep apnea. - she is compliant with CPAP and reports benefit from therapy - she uses [employer] for her DME - current CPAP ordered October 2021 - continue CPAP 9 cm H2O  Coronary artery disease. - reports intermittent episodes of chest pain and CT chest showed severe coronary calcification - she has an appointment with Dr. Bryan Lemma with [facility] on 07/18/23  GERD, Esophageal dysphagia. - previously followed by Dr. Dolores Frame with Gastroenterology  Time Spent Involved in Patient Care on Day of Examination:  39 minutes  Follow up:   Patient Instructions  Use your oxygen more often during the day with activity  Follow up in 4 months  Medication List:   Allergies as of 07/03/2023       Reactions   Adhesive [tape] Other (See Comments)   REACTION: redness/irritation at application site. **Certain bandages/adhesives cause this reaction**   Avelox [moxifloxacin] Anaphylaxis   Bactrim [sulfamethoxazole-trimethoprim] Anaphylaxis, Shortness Of Breath, Rash, Other (See Comments)   REACTION: Choking, inability to swallow, redness   Levaquin [levofloxacin] Anaphylaxis, Shortness Of Breath, Rash, Other (See Comments)   Reaction:Choking Brand name Levaquin ok per pt   Mucinex [guaifenesin Er] Anaphylaxis, Shortness Of Breath, Rash   Peanut-containing Drug Products Anaphylaxis   Penicillins Other (See Comments)   "Passed out" Has patient had a PCN reaction causing immediate  rash, facial/tongue/throat swelling, SOB or lightheadedness with hypotension: Unknown Has patient had a PCN reaction causing severe rash involving mucus membranes or skin necrosis: No Has patient had a PCN reaction that required hospitalization: No Has patient had a PCN reaction occurring within the last 10 years: No If all of the above answers are "NO", then may proceed with Cephalosporin use.   Sudafed [pseudoephedrine Hcl] Shortness Of Breath, Rash, Other (See Comments)   REACTION: Choking, redness, inability to swallow   Crestor [rosuvastatin] Other (See Comments)   Leg pain   Lipitor [atorvastatin] Other (See Comments)   Leg pain   Vytorin [ezetimibe-simvastatin] Other (See Comments)   Leg pain   Fish Allergy Nausea And Vomiting   Fish Oil Nausea And Vomiting   Norvasc [amlodipine] Other (See Comments)   unknown   Shrimp [shellfish Allergy] Nausea And Vomiting        Medication List        Accurate as of July 03, 2023  1:56 PM. If you have any questions, ask your nurse or doctor.          albuterol 108 (90 Base) MCG/ACT inhaler Commonly known as: VENTOLIN HFA Inhale 2 puffs into the lungs every 6 (six) hours as needed for wheezing or shortness of breath.   albuterol (  2.5 MG/3ML) 0.083% nebulizer solution Commonly known as: PROVENTIL Take 3 mLs (2.5 mg total) by nebulization every 6 (six) hours as needed for wheezing or shortness of breath.   ALPRAZolam 0.5 MG tablet Commonly known as: XANAX Take 0.25 mg by mouth 2 (two) times daily.   Arnuity Ellipta 100 MCG/ACT Aepb Generic drug: Fluticasone Furoate Inhale 1 puff into the lungs daily in the afternoon.   bisoprolol-hydrochlorothiazide 2.5-6.25 MG tablet Commonly known as: ZIAC Take 1 tablet by mouth daily.   busPIRone 15 MG tablet Commonly known as: BUSPAR Take 15 mg by mouth 2 (two) times daily.   CALCIUM 600 + D PO Take 1 tablet by mouth daily.   CoQ10 100 MG Caps Take 100 mg by mouth every morning.    CRANBERRY PO Take 1 capsule by mouth daily.   escitalopram 20 MG tablet Commonly known as: LEXAPRO Take 20 mg by mouth every morning.   esomeprazole 20 MG capsule Commonly known as: NEXIUM Take 20 mg by mouth daily.   fluticasone 50 MCG/ACT nasal spray Commonly known as: FLONASE Place 1 spray into both nostrils daily as needed for allergies or rhinitis.   furosemide 20 MG tablet Commonly known as: LASIX TAKE ONE TABLET EVERY OTHER DAY.   Gaviscon 80-14.2 MG Chew Generic drug: Alum Hydroxide-Mag Trisilicate Chew 2 tablets by mouth at bedtime.   HAIR/SKIN/NAILS PO Take 1 tablet by mouth at bedtime.   isosorbide mononitrate 60 MG 24 hr tablet Commonly known as: IMDUR TAKE ONE TABLET BY MOUTH ONCE DAILY.   mirtazapine 15 MG tablet Commonly known as: REMERON Take 7.5 mg by mouth at bedtime.   multivitamin tablet Take 1 tablet by mouth at bedtime.   nitroGLYCERIN 0.4 MG SL tablet Commonly known as: NITROSTAT PLACE 1 TAB UNDER TONGUE EVERY 5 MIN IF NEEDED FOR CHEST PAIN. MAY USE 3 TIMES.NO RELIEF CALL 911.   potassium chloride SA 20 MEQ tablet Commonly known as: KLOR-CON M TAKE 1 TABLET EVERY OTHER DAY, TAKE ON DAYS THAT YOU TAKE FUROSEMIDE.   pravastatin 80 MG tablet Commonly known as: PRAVACHOL TAKE 1 TABLET BY MOUTH ONCE EVERY EVENING.   Repatha SureClick 140 MG/ML Soaj Generic drug: Evolocumab INJECT INTO THE SKIN ONCE EVERY 14 DAYS.   Spiriva Respimat 2.5 MCG/ACT Aers Generic drug: Tiotropium Bromide Monohydrate Inhale 2 puffs into the lungs daily in the afternoon. What changed: See the new instructions. Changed by: Coralyn Helling, MD   valsartan 40 MG tablet Commonly known as: DIOVAN Take 1 tablet (40 mg total) by mouth daily.        Signature:  Coralyn Helling, MD [facility] Pager - [phone] 07/03/2023, 1:56 PM

## 2023-07-04 DIAGNOSIS — E1151 Type 2 diabetes mellitus with diabetic peripheral angiopathy without gangrene: Secondary | ICD-10-CM | POA: Diagnosis not present

## 2023-07-04 DIAGNOSIS — E1140 Type 2 diabetes mellitus with diabetic neuropathy, unspecified: Secondary | ICD-10-CM | POA: Diagnosis not present

## 2023-07-08 ENCOUNTER — Other Ambulatory Visit: Payer: Self-pay | Admitting: Cardiology

## 2023-07-12 ENCOUNTER — Telehealth: Payer: Self-pay | Admitting: Cardiology

## 2023-07-12 ENCOUNTER — Telehealth: Payer: Self-pay

## 2023-07-12 ENCOUNTER — Other Ambulatory Visit (HOSPITAL_COMMUNITY): Payer: Self-pay

## 2023-07-12 DIAGNOSIS — I25119 Atherosclerotic heart disease of native coronary artery with unspecified angina pectoris: Secondary | ICD-10-CM

## 2023-07-12 DIAGNOSIS — E785 Hyperlipidemia, unspecified: Secondary | ICD-10-CM

## 2023-07-12 NOTE — Telephone Encounter (Signed)
Pharmacy Patient Advocate Encounter   Received notification from Pt Calls Messages that prior authorization for REPATHA is required/requested.   Insurance verification completed.   The patient is insured through [facility] .   Per test claim: PA submitted to [employer] via CoverMyMeds Key/confirmation #/EOC [id number] Status is pending

## 2023-07-12 NOTE — Telephone Encounter (Signed)
PA request has been Submitted. New Encounter created for follow up. For additional info see Pharmacy Prior Auth telephone encounter from 07/12/23.

## 2023-07-12 NOTE — Telephone Encounter (Signed)
Pt c/o medication issue:  1. Name of Medication:   Evolocumab (REPATHA SURECLICK) 140 MG/ML SOAJ   2. How are you currently taking this medication (dosage and times per day)?   3. Are you having a reaction (difficulty breathing--STAT)?   4. What is your medication issue?   Patient stated her insurance company denied this medication as they will need prior authorization to get this medication.  Patient stated she has been without this medication since March.  Case# [id number].

## 2023-07-13 MED ORDER — REPATHA SURECLICK 140 MG/ML ~~LOC~~ SOAJ
1.0000 mL | SUBCUTANEOUS | 5 refills | Status: DC
Start: 2023-07-13 — End: 2024-04-15

## 2023-07-13 NOTE — Telephone Encounter (Signed)
Pharmacy Patient Advocate Encounter   Received notification from [facility] that Prior Authorization for REPATHA has been APPROVED from 07/12/23 to 07/11/24.Marland Kitchen     Left message on pt's machine (dpr) with the information above.  Sent refill to pharmacy. Sent a MyChart message to patient with this information as well.

## 2023-07-13 NOTE — Telephone Encounter (Signed)
Pharmacy Patient Advocate Encounter  Received notification from [facility] that Prior Authorization for REPATHA has been APPROVED from 07/12/23 to 07/11/24.Marland Kitchen

## 2023-07-17 NOTE — Assessment & Plan Note (Signed)
Not currently on any antiplatelet agent.  Need to figure out what happened but I think we should probably have her restart Plavix since there is a concern about intolerance of aspirin.

## 2023-07-17 NOTE — Progress Notes (Unsigned)
Cardiology Office Note:  .   Date:  07/18/2023  ID:  Vickie [name], DOB Jan 19, 1946, MRN [id number] PCP: Assunta Found, MD  [facility] Providers Cardiologist:  Bryan Lemma, MD     Chief Complaint  Patient presents with   Follow-up    Have been doing relatively well, but is now having some episodes of chest pain   Coronary Artery Disease    History of PCI with only single-vessel disease.  Now having some resting chest pain spells.    History of Present Illness: .     Vickie [name] is an obese 77 y.o. female with a PMH notable for CAD-PCI, HTN, HLD (completed Orion 8 Trial), COPD/OSA & Congenital Phocomeilia who presents here for 10 month f/u at the request of Assunta Found, MD.  Vickie [name] was last seen on January 27, 2022 noting that her BP levels were getting higher 140/84-1 70/86 mm range.  Also irregular heart rate was going up.  Swelling relatively well-controlled.  No PND or orthopnea.  No chest pain or pressure just the occasional intermittent twinging sensations off and on.  No palpitations.  Still noted low energy, but better with increased heart rate.  Some dizziness and headaches.  Mild memory loss, anxiety and insomnia.  => Seen by Phillips Hay, RPH- CCP for BP management as well as hyperlipidemia.  She describes more than usual dyspnea.  He is at home oxygen in conjunction with albuterol nebulizer.  SBP readings ranged from 114-145 with normal diastolic pressures.  Only 7 readings were greater than 130 and 2 were greater than 140. => Continued on bisoprolol/HCTZ 2.5/6.25 mg daily along with valsartan 40 mg daily.  Was told to hold bisoprolol if heart rates were in the 40s.  Remains on pravastatin and fenofibrate along with Repatha -> prior authorization for Repatha ordered.     Subjective  INTERVAL HISTORY Vickie [name], ER visit 05/14/2023: Was hypotensive and tachycardic.  Oxygen saturation 89% and urgent care.  Noted dyspnea bilateral hip pain,  congestion, cough and bodyaches.  Unsure of fever.  Felt better to relieve.  Thought to have either viral or atypical pneumonia.  Given prescription for doxycycline. Referred by Dr. Craige Cotta (Pulm Med) 07/03/2023 - f/u Chest CT showed stable fibrotic changes.  Nursing midsternal chest comfort radiating to neck and arms lasting seconds.  Still gets winded with activity.  Not using oxygen during the day.  CPAP nightly.  ->  Encouraged to use 2 L of oxygen with exertion.  Also PRN inhaler or nebulizer.  Vickie [name] returns here today for follow-up stating that she just has not really been feeling quite as well of late.  She still gets short of breath I am not that much, but not really having any notable edema.  No PND or orthopnea.  She says that although her right heart rate is 47 now, she can easily get it into the 70s and 80s with routine activity.  She not necessarily noticing any rapid irregular heartbeats or palpitations.  She says her blood pressures have been up and down but for the most part when I look at her blood pressure log they are in where in the 1 teens to 140s with average usually in the 130s over 70s.  Her heart rates I see recorded anywhere from 40s to the 80s and 90s. We discussed the coronary calcification seen on her CT scan which is simply identifying that we know she has coronary disease. She tells me  that she has had for 5 spells in the last few months of a tight squeezing sensation in her left chest at about her breast that squeezes type makes her short of breath.  These episodes last about 5 minutes and they are always happening at rest, not with exertion she has not had any of the symptoms without exertion.  She has not had any in the last couple weeks but has had at least 4 bad spells which she discussed with Dr. Craige Cotta. Swelling is pretty well-controlled and she takes her Lasix every other day.  She is of course very concerned about some high blood pressure readings that she has  off-and-on but they are relatively infrequent. She also notes that she ran out of Repatha back in March and was thinking that the medicine was not refilled automatically.  She waited until this visit to work on getting the prior authorization redone and reordered.   ROS:  Cardiovascular ROS: positive for - chest pain, dyspnea on exertion, and exercise intolerance, fatigue.  Poor sleep.  Tires out easily. negative for - edema, irregular heartbeat, orthopnea, palpitations, paroxysmal nocturnal dyspnea, rapid heart rate, or syncope or near syncope, TIA or emesis fugax, claudication Review of Systems - Negative except dyspnea, fatigue and low energy as noted. At baseline she is not very active, pretty sedentary.  She also has pretty significant anxiety.    Objective   Studies Reviewed: Marland Kitchen   EKG Interpretation Date/Time:  Tuesday July 18 2023 10:36:32 EDT Ventricular Rate:  47 PR Interval:  204 QRS Duration:  88 QT Interval:  512 QTC Calculation: 453 R Axis:   -21  Text Interpretation: Sinus bradycardia Nonspecific ST abnormality When compared with ECG of 27-Dec-2019 13:56, Criteria for LVH by voltage NO LONGER PRESENT Confirmed by Bryan Lemma ([id number]) on 07/18/2023 11:12:20 AM   Past Surgical History:  Procedure  Date   CARDIAC CATHETERIZATION  2006,2007; 2009   Non-occlusice CAD - only 80% ostial SP1;  NON DOMINANNT  RCA (catheter insuced dissection with MI in 2007 --> resolved by 2009 cath)      LEFT HEART CATH AND CORONARY ANGIOGRAPHY - PCI  09/18/2017   Procedure: LEFT HEART CATH AND CORONARY ANGIOGRAPHY;  Surgeon: Marykay Lex, MD;  Location: [facility] INVASIVE CV LAB: [facility] - 80% Diag2 (FFR 0.78)- PCI.  pLAD 40%, pCx 40%. Post PCI LBBB. LVEDP - ~20 mmHg. EF 55-60%P CORONARY STENT INTERVENTION: DES PCI Diag2: Synergy DES 2.25 mm x 12 mm (2.4 mm)        NM MYOVIEW LTD  08/04/2021   LOW RISK. EF 55-60%. Mild fixed apical defect - /w breast attenuation.   TRANSTHORACIC ECHOCARDIOGRAM   08/04/2021   Normal LVEF 55-60%.  Septal movement consistent with LBBB now present.  Unable to assess diastolic function.  Normal RV size and function.  Unable assess PAP.  Normal aortic and mitral valves.  Normal RAP.   Lab Results  Component Value Date   CHOL 115 04/12/2022   HDL 46 04/12/2022   LDLCALC 28 04/12/2022   TRIG 280 (H) 04/12/2022   CHOLHDL 2.5 04/12/2022      Social Hx:  no tobacco, no alcohol. Coffee - 2 + big cups per day    Diet:   eats out regularly - mostly fast food: [place], [employer], [employer] pizza; at home will much on [employer] breakdfast bar, toast    Exercise: none    Risk Assessment/Calculations:  Physical Exam:   VS:  BP 122/74 (BP Location: Left Arm, Patient Position: Sitting, Cuff Size: Normal)   Pulse (!) 47   Ht 5' (1.524 m)   Wt 182 lb 3.2 oz (82.6 kg)   SpO2 94%   BMI 35.58 kg/m    Wt Readings from Last 3 Encounters:  07/18/23 182 lb 3.2 oz (82.6 kg)  07/03/23 181 lb 9.6 oz (82.4 kg)  05/14/23 185 lb (83.9 kg)    GEN: Well nourished, well de groomed veloped in no acute distress; obese NECK: No JVD; No carotid bruits CARDIAC: Normal S1, S2; RRR-occasional ectopy, 1/6 SEM RUSB.  Otherwise no murmurs, rubs, or gallops RESPIRATORY:  Clear to auscultation without rales, wheezing or rhonchi ; nonlabored, good air movement.  Mild interstitial lung sounds. ABDOMEN: Soft, non-tender, non-distended EXTREMITIES:  No edema; phocomelia changes-hands and legs/feet     ASSESSMENT AND PLAN: .    Problem List Items Addressed This Visit       Cardiology Problems   Myocardial infarction [facility]) (Chronic)    This history of MI in the setting of cath induced RCA dissection.  Type IVa.  Does not count for preop assessments etc.  Echo is normal.      Relevant Medications   amLODipine (NORVASC) 2.5 MG tablet   nitroGLYCERIN (NITROSTAT) 0.4 MG SL tablet   Other Relevant Orders   EKG 12-Lead (Completed)   Hyperlipidemia LDL goal <70  (Chronic)    Is on combination of pravastatin and supposed to be on Repatha, which we will restart.  Can recheck lipid panel prior to her follow-up.      Relevant Medications   amLODipine (NORVASC) 2.5 MG tablet   nitroGLYCERIN (NITROSTAT) 0.4 MG SL tablet   Other Relevant Orders   Lipid panel   Essential hypertension (Chronic)    She is keeping a BP log which seems to be well-controlled on combination of bisoprolol-HCTZ and valsartan.  We do have a little bit of room to work with based on her log with some highs in the 160 range.  I want her to reassess her pressures after she checks it and it is high in about an hour or so if still high then take extra dose of Imdur or Lasix. Will add amlodipine 2.5 mg daily more for antianginal benefit.  If it does not show improvement or there is no improvement in symptoms with stopping the nitroglycerin, then we can probably consider stopping the future.  However, if it does help and we need more blood pressure room my recommendation would be to actually stop the losartan.      Relevant Medications   amLODipine (NORVASC) 2.5 MG tablet   nitroGLYCERIN (NITROSTAT) 0.4 MG SL tablet   Other Relevant Orders   EKG 12-Lead (Completed)   Coronary artery disease involving native coronary artery of native heart without angina pectoris - Primary (Chronic)    Basically single-vessel disease of the diagonal branch treated PCI several years ago.  She is a nondominant RCA that in the past was dissected, but has been stable on follow-up catheterizations.  At all associated with activity, in fact they are usually in the afternoons or evenings when she is trying to rest.  My concern is that this could be related to coronary spasm.  Unfortunate, she is not using any nitroglycerin.  Plan: Continue routine therapy for CAD: Combination of restarting Repatha along with pravastatin, bisoprolol-HCTZ, Imdur and valsartan. In the past, we have stopped the beta-blocker because of  fatigue issues, but her heart rate went up fast.  We therefore decided to cut down the beta-blocker dose and use bisoprolol HCTZ which she seems to be doing relatively well on.  Heart rate seems to be okay. I am going to add low-dose amlodipine as her blood pressure seem to be little higher and we have room to use amlodipine for its antianginal benefit/antispasm effect.= Start amlodipine 2.5 mg daily She needs to use her as needed nitroglycerin if she has episodes of chest comfort to see if they improve.  If they do want her to actually take an extra dose of Imdur shortly thereafter when she has an episode.  They do not seem to be having that frequently.  Will reassess in about 2 to 3 months to see how she is doing.      Relevant Medications   amLODipine (NORVASC) 2.5 MG tablet   nitroGLYCERIN (NITROSTAT) 0.4 MG SL tablet   Other Relevant Orders   EKG 12-Lead (Completed)   Lipid panel     Other   Status post coronary artery stent placement (Chronic)     Need to confirm that she is on aspirin.      Relevant Orders   EKG 12-Lead (Completed)   Lipid panel   Chronic hypoxic respiratory failure, on home oxygen therapy (HCC) (Chronic)    Followed by pulmonary medicine- Dr. Craige Cotta (who may actually be moving soon). She has some ILD and groundglass opacities noted following a COVID bout years ago.  His recommendation was for her to use oxygen with exertion, and she does not always do that.  If she uses that she feels okay when she is walk around, but without it she does feel more short of breath than 15.  Regardless, she does get pretty significant exertional dyspnea and therefore somewhat sedentary.  She does not have exertional chest pain.      Chest wall pain (Chronic)    She has had some chest wall pain in the past which this pain very well could be, but the Nishan symptoms is more concerning.  Will assess as noted.              Dispo: Return in about 3 months (around 10/18/2023) for  3-4 month follow-up, Follow-up in [facility] office, Routine follow up with me.  Total time spent: 41 min spent with patient + 16 + 10 min spent charting = 57 min     Signed, Marykay Lex, MD, MS Bryan Lemma, M.D., M.S. Interventional Cardiologist  [facility]  Pager # [phone] Phone # [phone] [address] [place], [place] [address]

## 2023-07-18 ENCOUNTER — Encounter: Payer: Self-pay | Admitting: Cardiology

## 2023-07-18 ENCOUNTER — Ambulatory Visit: Payer: Medicare Other | Attending: Cardiology | Admitting: Cardiology

## 2023-07-18 VITALS — BP 122/74 | HR 47 | Ht 60.0 in | Wt 182.2 lb

## 2023-07-18 DIAGNOSIS — I2510 Atherosclerotic heart disease of native coronary artery without angina pectoris: Secondary | ICD-10-CM | POA: Diagnosis not present

## 2023-07-18 DIAGNOSIS — I10 Essential (primary) hypertension: Secondary | ICD-10-CM

## 2023-07-18 DIAGNOSIS — Z955 Presence of coronary angioplasty implant and graft: Secondary | ICD-10-CM

## 2023-07-18 DIAGNOSIS — J9611 Chronic respiratory failure with hypoxia: Secondary | ICD-10-CM

## 2023-07-18 DIAGNOSIS — E785 Hyperlipidemia, unspecified: Secondary | ICD-10-CM

## 2023-07-18 DIAGNOSIS — R0789 Other chest pain: Secondary | ICD-10-CM

## 2023-07-18 DIAGNOSIS — I21B Myocardial infarction with coronary microvascular dysfunction: Secondary | ICD-10-CM | POA: Diagnosis not present

## 2023-07-18 DIAGNOSIS — I447 Left bundle-branch block, unspecified: Secondary | ICD-10-CM

## 2023-07-18 DIAGNOSIS — Z9981 Dependence on supplemental oxygen: Secondary | ICD-10-CM

## 2023-07-18 MED ORDER — NITROGLYCERIN 0.4 MG SL SUBL: SUBLINGUAL_TABLET | SUBLINGUAL | 5 refills | Status: AC

## 2023-07-18 MED ORDER — AMLODIPINE BESYLATE 2.5 MG PO TABS: 2.5000 mg | ORAL_TABLET | Freq: Every day | ORAL | 6 refills | Status: DC

## 2023-07-18 NOTE — Assessment & Plan Note (Signed)
Is on combination of pravastatin and supposed to be on Repatha, which we will restart.  Can recheck lipid panel prior to her follow-up.

## 2023-07-18 NOTE — Patient Instructions (Addendum)
Medication Instructions:    Amlodipine 2.5 mg at bedtime     If you have to use Nitroglycerin  sublingual  tablet for chest pain , if discomfort is relieved in one to two minutes  then take extra dose of Isosorbide mononitrate ( Imdur)    *If you need a refill on your cardiac medications before your next appointment, please call your pharmacy*   Lab Work: Lipid prior to next visit with Dr Herbie Baltimore.  If you have labs (blood work) drawn today and your tests are completely normal, you will receive your results only by: MyChart Message (if you have MyChart) OR A paper copy in the mail If you have any lab test that is abnormal or we need to change your treatment, we will call you to review the results.   Testing/Procedures: Lipid  - prior to next appt    Follow-Up: At [facility], you and your health needs are our priority.  As part of our continuing mission to provide you with exceptional heart care, we have created designated Provider Care Teams.  These Care Teams include your primary Cardiologist (physician) and Advanced Practice Providers (APPs -  Physician Assistants and Nurse Practitioners) who all work together to provide you with the care you need, when you need it.     Your next appointment:    2 to 3 month(s)  The format for your next appointment:   In Person  Provider:   Bryan Lemma, MD    Other Instructions

## 2023-07-18 NOTE — Assessment & Plan Note (Signed)
This history of MI in the setting of cath induced RCA dissection.  Type IVa.  Does not count for preop assessments etc.  Echo is normal.

## 2023-07-18 NOTE — Assessment & Plan Note (Signed)
Basically single-vessel disease of the diagonal branch treated PCI several years ago.  She is a nondominant RCA that in the past was dissected, but has been stable on follow-up catheterizations.  At all associated with activity, in fact they are usually in the afternoons or evenings when she is trying to rest.  My concern is that this could be related to coronary spasm.  Unfortunate, she is not using any nitroglycerin.  Plan: Continue routine therapy for CAD: Combination of restarting Repatha along with pravastatin, bisoprolol-HCTZ, Imdur and valsartan. In the past, we have stopped the beta-blocker because of fatigue issues, but her heart rate went up fast.  We therefore decided to cut down the beta-blocker dose and use bisoprolol HCTZ which she seems to be doing relatively well on.  Heart rate seems to be okay. I am going to add low-dose amlodipine as her blood pressure seem to be little higher and we have room to use amlodipine for its antianginal benefit/antispasm effect.= Start amlodipine 2.5 mg daily She needs to use her as needed nitroglycerin if she has episodes of chest comfort to see if they improve.  If they do want her to actually take an extra dose of Imdur shortly thereafter when she has an episode.  They do not seem to be having that frequently.  Will reassess in about 2 to 3 months to see how she is doing.

## 2023-07-18 NOTE — Assessment & Plan Note (Signed)
She is keeping a BP log which seems to be well-controlled on combination of bisoprolol-HCTZ and valsartan.  We do have a little bit of room to work with based on her log with some highs in the 160 range.  I want her to reassess her pressures after she checks it and it is high in about an hour or so if still high then take extra dose of Imdur or Lasix. Will add amlodipine 2.5 mg daily more for antianginal benefit.  If it does not show improvement or there is no improvement in symptoms with stopping the nitroglycerin, then we can probably consider stopping the future.  However, if it does help and we need more blood pressure room my recommendation would be to actually stop the losartan.

## 2023-07-18 NOTE — Assessment & Plan Note (Signed)
She has had some chest wall pain in the past which this pain very well could be, but the Nishan symptoms is more concerning.  Will assess as noted.

## 2023-07-18 NOTE — Assessment & Plan Note (Signed)
Followed by pulmonary medicine- Dr. Craige Cotta (who may actually be moving soon). She has some ILD and groundglass opacities noted following a COVID bout years ago.  His recommendation was for her to use oxygen with exertion, and she does not always do that.  If she uses that she feels okay when she is walk around, but without it she does feel more short of breath than 15.  Regardless, she does get pretty significant exertional dyspnea and therefore somewhat sedentary.  She does not have exertional chest pain.

## 2023-07-20 ENCOUNTER — Other Ambulatory Visit: Payer: Self-pay | Admitting: Family Medicine

## 2023-07-20 DIAGNOSIS — Z1231 Encounter for screening mammogram for malignant neoplasm of breast: Secondary | ICD-10-CM

## 2023-08-12 ENCOUNTER — Other Ambulatory Visit: Payer: Self-pay | Admitting: Cardiology

## 2023-08-22 ENCOUNTER — Ambulatory Visit
Admission: RE | Admit: 2023-08-22 | Discharge: 2023-08-22 | Disposition: A | Discharge status: Home / Self Care | Payer: Medicare Other | Source: Ambulatory Visit | Attending: Family Medicine | Admitting: Family Medicine

## 2023-08-22 DIAGNOSIS — Z1231 Encounter for screening mammogram for malignant neoplasm of breast: Secondary | ICD-10-CM

## 2023-08-23 DIAGNOSIS — K080 Exfoliation of teeth due to systemic causes: Secondary | ICD-10-CM | POA: Diagnosis not present

## 2023-08-31 ENCOUNTER — Other Ambulatory Visit: Payer: Self-pay | Admitting: Cardiology

## 2023-09-05 ENCOUNTER — Other Ambulatory Visit: Payer: Self-pay | Admitting: Cardiology

## 2023-09-05 DIAGNOSIS — E7849 Other hyperlipidemia: Secondary | ICD-10-CM | POA: Diagnosis not present

## 2023-09-05 DIAGNOSIS — Z0001 Encounter for general adult medical examination with abnormal findings: Secondary | ICD-10-CM | POA: Diagnosis not present

## 2023-09-05 DIAGNOSIS — E785 Hyperlipidemia, unspecified: Secondary | ICD-10-CM | POA: Diagnosis not present

## 2023-09-05 DIAGNOSIS — I10 Essential (primary) hypertension: Secondary | ICD-10-CM | POA: Diagnosis not present

## 2023-09-05 DIAGNOSIS — N182 Chronic kidney disease, stage 2 (mild): Secondary | ICD-10-CM | POA: Diagnosis not present

## 2023-09-05 DIAGNOSIS — Z6837 Body mass index (BMI) 37.0-37.9, adult: Secondary | ICD-10-CM | POA: Diagnosis not present

## 2023-09-05 DIAGNOSIS — E782 Mixed hyperlipidemia: Secondary | ICD-10-CM | POA: Diagnosis not present

## 2023-09-05 DIAGNOSIS — F330 Major depressive disorder, recurrent, mild: Secondary | ICD-10-CM | POA: Diagnosis not present

## 2023-09-05 DIAGNOSIS — E1159 Type 2 diabetes mellitus with other circulatory complications: Secondary | ICD-10-CM | POA: Diagnosis not present

## 2023-09-05 DIAGNOSIS — Z1331 Encounter for screening for depression: Secondary | ICD-10-CM | POA: Diagnosis not present

## 2023-09-05 DIAGNOSIS — I2510 Atherosclerotic heart disease of native coronary artery without angina pectoris: Secondary | ICD-10-CM | POA: Diagnosis not present

## 2023-09-05 DIAGNOSIS — E6609 Other obesity due to excess calories: Secondary | ICD-10-CM | POA: Diagnosis not present

## 2023-09-06 LAB — LIPID PANEL
Chol/HDL Ratio: 1.6 ratio (ref 0.0–4.4)
Cholesterol, Total: 95 mg/dL — ABNORMAL LOW (ref 100–199)
HDL: 59 mg/dL (ref >39)
LDL Chol Calc (NIH): 12 mg/dL (ref 0–99)
Triglycerides: 144 mg/dL (ref 0–149)
VLDL Cholesterol Cal: 24 mg/dL (ref 5–40)

## 2023-09-12 ENCOUNTER — Telehealth: Payer: Self-pay | Admitting: *Deleted

## 2023-09-12 MED ORDER — PRAVASTATIN SODIUM 80 MG PO TABS: 40.0000 mg | ORAL_TABLET | Freq: Every day | ORAL | 3 refills | Status: DC

## 2023-09-12 NOTE — Telephone Encounter (Signed)
-----   Message from Bryan Lemma sent at 09/10/2023  6:37 PM EDT ----- Cholesterol levels look even better than last year.  LDL now down to 12.  Total cholesterol less than 100.  I think we can probably reduce the pravastatin dose down to 1/2 tablet  Bryan Lemma, MD

## 2023-09-12 NOTE — Telephone Encounter (Signed)
The patient has been notified of the result and verbalized understanding.  All questions (if any) were answered.  Patient aware to reduce Pravastatin to 40 mg ( 1/2 tablet of 80 mg ) . Patient states she will inform her daughter of change.    E-sent RX to pharmacy. Tobin Chad, RN 09/12/2023 9:03 AM

## 2023-09-14 DIAGNOSIS — E1140 Type 2 diabetes mellitus with diabetic neuropathy, unspecified: Secondary | ICD-10-CM | POA: Diagnosis not present

## 2023-09-14 DIAGNOSIS — E1151 Type 2 diabetes mellitus with diabetic peripheral angiopathy without gangrene: Secondary | ICD-10-CM | POA: Diagnosis not present

## 2023-09-18 ENCOUNTER — Other Ambulatory Visit (HOSPITAL_COMMUNITY): Payer: Self-pay | Admitting: Internal Medicine

## 2023-09-18 DIAGNOSIS — M5416 Radiculopathy, lumbar region: Secondary | ICD-10-CM

## 2023-09-18 DIAGNOSIS — Z6836 Body mass index (BMI) 36.0-36.9, adult: Secondary | ICD-10-CM | POA: Diagnosis not present

## 2023-09-18 DIAGNOSIS — E1159 Type 2 diabetes mellitus with other circulatory complications: Secondary | ICD-10-CM | POA: Diagnosis not present

## 2023-09-18 DIAGNOSIS — N182 Chronic kidney disease, stage 2 (mild): Secondary | ICD-10-CM | POA: Diagnosis not present

## 2023-09-18 DIAGNOSIS — M5126 Other intervertebral disc displacement, lumbar region: Secondary | ICD-10-CM | POA: Diagnosis not present

## 2023-09-18 DIAGNOSIS — M47812 Spondylosis without myelopathy or radiculopathy, cervical region: Secondary | ICD-10-CM | POA: Diagnosis not present

## 2023-09-18 DIAGNOSIS — E6609 Other obesity due to excess calories: Secondary | ICD-10-CM | POA: Diagnosis not present

## 2023-09-18 DIAGNOSIS — F330 Major depressive disorder, recurrent, mild: Secondary | ICD-10-CM | POA: Diagnosis not present

## 2023-09-20 ENCOUNTER — Ambulatory Visit (HOSPITAL_COMMUNITY)
Admission: RE | Admit: 2023-09-20 | Discharge: 2023-09-20 | Disposition: A | Discharge status: Home / Self Care | Payer: Medicare Other | Source: Ambulatory Visit | Attending: Internal Medicine | Admitting: Internal Medicine

## 2023-09-20 DIAGNOSIS — M4316 Spondylolisthesis, lumbar region: Secondary | ICD-10-CM | POA: Diagnosis not present

## 2023-09-20 DIAGNOSIS — M47816 Spondylosis without myelopathy or radiculopathy, lumbar region: Secondary | ICD-10-CM | POA: Diagnosis not present

## 2023-09-20 DIAGNOSIS — M5126 Other intervertebral disc displacement, lumbar region: Secondary | ICD-10-CM | POA: Diagnosis not present

## 2023-09-20 DIAGNOSIS — M5135 Other intervertebral disc degeneration, thoracolumbar region: Secondary | ICD-10-CM | POA: Diagnosis not present

## 2023-09-20 DIAGNOSIS — M5416 Radiculopathy, lumbar region: Secondary | ICD-10-CM | POA: Diagnosis not present

## 2023-09-20 DIAGNOSIS — M48061 Spinal stenosis, lumbar region without neurogenic claudication: Secondary | ICD-10-CM | POA: Diagnosis not present

## 2023-09-27 DIAGNOSIS — G4733 Obstructive sleep apnea (adult) (pediatric): Secondary | ICD-10-CM | POA: Diagnosis not present

## 2023-10-04 DIAGNOSIS — Z23 Encounter for immunization: Secondary | ICD-10-CM | POA: Diagnosis not present

## 2023-10-10 ENCOUNTER — Encounter: Payer: Self-pay | Admitting: Cardiology

## 2023-10-10 ENCOUNTER — Ambulatory Visit: Payer: Medicare Other | Attending: Cardiology | Admitting: Cardiology

## 2023-10-10 VITALS — BP 138/60 | HR 56 | Ht 60.0 in | Wt 185.6 lb

## 2023-10-10 DIAGNOSIS — M5416 Radiculopathy, lumbar region: Secondary | ICD-10-CM

## 2023-10-10 DIAGNOSIS — E785 Hyperlipidemia, unspecified: Secondary | ICD-10-CM

## 2023-10-10 DIAGNOSIS — I10 Essential (primary) hypertension: Secondary | ICD-10-CM

## 2023-10-10 DIAGNOSIS — I25119 Atherosclerotic heart disease of native coronary artery with unspecified angina pectoris: Secondary | ICD-10-CM | POA: Diagnosis not present

## 2023-10-10 DIAGNOSIS — Z955 Presence of coronary angioplasty implant and graft: Secondary | ICD-10-CM

## 2023-10-10 DIAGNOSIS — R0609 Other forms of dyspnea: Secondary | ICD-10-CM

## 2023-10-10 DIAGNOSIS — Z9981 Dependence on supplemental oxygen: Secondary | ICD-10-CM

## 2023-10-10 DIAGNOSIS — R600 Localized edema: Secondary | ICD-10-CM

## 2023-10-10 DIAGNOSIS — I21B Myocardial infarction with coronary microvascular dysfunction: Secondary | ICD-10-CM

## 2023-10-10 DIAGNOSIS — J9611 Chronic respiratory failure with hypoxia: Secondary | ICD-10-CM | POA: Diagnosis not present

## 2023-10-10 DIAGNOSIS — I2510 Atherosclerotic heart disease of native coronary artery without angina pectoris: Secondary | ICD-10-CM

## 2023-10-10 MED ORDER — PRAVASTATIN SODIUM 20 MG PO TABS: 20.0000 mg | ORAL_TABLET | Freq: Every evening | ORAL | 3 refills | Status: DC

## 2023-10-10 MED ORDER — POTASSIUM CHLORIDE CRYS ER 20 MEQ PO TBCR: 20.0000 meq | EXTENDED_RELEASE_TABLET | Freq: Every day | ORAL | Status: DC | PRN

## 2023-10-10 MED ORDER — VALSARTAN 80 MG PO TABS: 80.0000 mg | ORAL_TABLET | Freq: Every day | ORAL | 3 refills | Status: DC

## 2023-10-10 MED ORDER — FUROSEMIDE 20 MG PO TABS: 20.0000 mg | ORAL_TABLET | Freq: Every day | ORAL | Status: DC | PRN

## 2023-10-10 NOTE — Patient Instructions (Signed)
Medication Instructions:  Lasix (furosemide) as needed if 3 pounds heavier over night or edema (swelling) Only take Potassium if you take lasix   Repatha once a month until you are out of the donut hole Starting In January when taking repatha twice a month then decrease pravastatin to 20mg   at bedtime   Increase valsartan to 80 mg  *If you need a refill on your cardiac medications before your next appointment, please call your pharmacy*   Lab Work: None    Testing/Procedures: none   Follow-Up: At [facility], you and your health needs are our priority.  As part of our continuing mission to provide you with exceptional heart care, we have created designated Provider Care Teams.  These Care Teams include your primary Cardiologist (physician) and Advanced Practice Providers (APPs -  Physician Assistants and Nurse Practitioners) who all work together to provide you with the care you need, when you need it.      Your next appointment:   6 month(s)  Provider:   Bernadene Person, NP    Then, Bryan Lemma, MD will plan to see you again in 1 year(s).

## 2023-10-10 NOTE — Progress Notes (Signed)
Cardiology Office Note:  .   Date:  10/14/2023  ID:  Vickie [name], DOB 1946/05/12, MRN [id number] PCP: Assunta Found, MD   HeartCare Providers Cardiologist:  Bryan Lemma, MD     Chief Complaint  Patient presents with   Follow-up    Reassessment of blood pressures and chest pain   Coronary Artery Disease    Relatively stable.    Patient Profile: .     Vickie [name] returns for 3 -4 month follow-up at the request of Assunta Found, MD. She is an obese 77 y.o. female with a PMH notable for CAD-PCI, HTN, HLD (completed Orion 8 Trial), COPD/OSA & Congenital Phocomeilia.    Vickie [name] was seen by Phillips Hay, [facility] back in October 2023 for BP management as well as hyperlipidemia.  She describes more than usual dyspnea.  He is at home oxygen in conjunction with albuterol nebulizer.  SBP readings ranged from 114-145 with normal diastolic pressures.  Only 7 readings were greater than 130 and 2 were greater than 140. => C  Was told to hold bisoprolol if heart rates were in the 40s.   Vickie [name] was last seen on 07/18/2023 Remains on pravastatin and fenofibrate along with Repatha -> prior authorization for Repatha; continued on bisoprolol/HCTZ 2.5/6.25 mg daily along with valsartan 40 mg daily.  -> Amlodipine added for additional blood pressure and possible spasm relief  Subjective  Discussed the use of AI scribe software for clinical note transcription with the patient, who gave verbal consent to proceed.  History of Present Illness   The patient, known with a history of CAD-PCI (& prior Catheter-related RCA dissection - Type IV MI), - with chronic "angina"/chest pain, hypertension and chest discomfort, presented with recurrent episodes of chest discomfort. She reported experiencing several episodes since the last visit in July, which she described as not being severe. These episodes occurred at rest, specifically when the patient was sitting still. The patient  estimated having about three episodes since the last visit. She managed these episodes with nitroglycerin, which effectively alleviated the discomfort.  The patient also reported a recent incident where she had to walk very fast across a road due to a family emergency. Following this incident, she experienced back pain, which she attributed to the fast walking. The patient had an MRI done for this issue, but the results were not discussed during this visit.  In terms of her cardiovascular health, the patient denied experiencing any heart racing, skipping, or flipping. She reported sleeping on one pillow and did not wake up in the middle of the night due to breathlessness. However, she acknowledged some ongoing issues with her breathing.  The patient's hypertension was noted to be slightly uncontrolled, with home readings around 140/80 in the mornings and 150-something in the afternoons. She was on a regimen of amlodipine, valsartan, and bisoprolol for blood pressure control.  The patient also reported being on Lasix, which she took as needed for swelling or weight gain, and potassium, which she took only when she took Lasix. She was also on Repatha for cholesterol control, which she was taking every other week. However, due to being in the donut hole, she was advised to take it once a month until the end of the year. Her cholesterol levels were well controlled, with an LDL of 7.  The patient's diabetes was also well controlled, with an A1c of 6.1 in September. She was also on pravastatin, which she was taking half of  an 80 mg tablet.  In summary, the patient presented with recurrent episodes of chest discomfort, borderline hypertension, and recent onset of back pain following a fast walk. Her cardiovascular health was otherwise stable, and her cholesterol and diabetes were well controlled.      Cardiovascular ROS: positive for - chest pain, dyspnea on exertion, palpitations, rapid heart rate, and as  noted above.  Edema well-controlled also notes that BP is not adequately controlled.-She has always had some mild nonexertional chest twinging/ Pinching sensations here and there that are not necessarily anginal in nature. negative for - orthopnea, paroxysmal nocturnal dyspnea, or syncope or near syncope, TIA or emesis fugax, claudication.  ROS:  Review of Systems - Negative except back pain, joint pains as noted above; still notes poor sleep with exercise intolerance due to deconditioning.  Remains somewhat sedentary..    Objective   Studies Reviewed: Marland Kitchen       No new test  Risk Assessment/Calculations:    Initial BP was 148/62 and follow-up was 138/70. => BPs at home have been slightly elevated.  We will increase valsartan dose to 80 mg daily.      Physical Exam:   VS:  BP 138/60   Pulse (!) 56   Ht 5' (1.524 m)   Wt 185 lb 9.6 oz (84.2 kg)   SpO2 92%   BMI 36.25 kg/m    Wt Readings from Last 3 Encounters:  10/10/23 185 lb 9.6 oz (84.2 kg)  07/18/23 182 lb 3.2 oz (82.6 kg)  07/03/23 181 lb 9.6 oz (82.4 kg)    GEN:  healthy-appearing; obese but well nourished, well well-groomed in no acute distress;.  Overall in pretty good spirits. NECK: No JVD; No carotid bruits CARDIAC: Normal S1, S2; RRR, 1/6 SEM at RUSB; no rubs, gallops RESPIRATORY:  Clear to auscultation without rales, wheezing or rhonchi ; nonlabored, good air movement. ABDOMEN: Soft, non-tender, non-distended EXTREMITIES:  No edema; diffuse MSK changes of phocomelia    ASSESSMENT AND PLAN: .    Problem List Items Addressed This Visit       Cardiology Problems   Coronary artery disease involving native coronary artery of native heart with angina pectoris (HCC) - Primary (Chronic)    Really single-vessel disease involving a small diagonal branch.  Years ago she had catheter related dissection of a nondominant RCA but has been patent and follow-up cath. She does have intermittent chest discomfort symptoms which may  or may not be true angina versus potential spasm.  Episodes are relieved by nitroglycerin and have improved since starting amlodipine.  We have evaluated a lot of her episodes with ischemic evaluation that they have been negative.  Will hold off on further evaluation at this point.  Plan: She is on bisoprolol-HCTZ 2.5-6.25 mg daily along with low-dose amlodipine 2.5 mg daily and Imdur 60 mg daily for antianginal benefit-relatively well-controlled. Requiring less frequent use of nitroglycerin.  I did recommend that if she takes nitroglycerin that she should double up her Imdur for the next couple days. He is also on valsartan for afterload reduction and we will increase the dose to 80 mg for more blood pressure control. She remains on a combination of pravastatin at 80 mg along with Repatha labs have been pretty well-controlled.  I think we can reduce her pravastatin dose down from 40 mg to 20 mg. We need to confirm that she is actually on aspirin.      Relevant Medications   furosemide (LASIX) 20 MG tablet  valsartan (DIOVAN) 80 MG tablet   pravastatin (PRAVACHOL) 20 MG tablet   Essential hypertension (Chronic)    Her BP log from home and the initial BP here they were relatively high.  But she also den has some dizzy spells when she wakes up.  This morning she felt dizzy but her blood pressure was 143/63 and heart rate was 63.   Blood pressure readings remain slightly elevated despite current medication regimen. -Increase Valsartan to 80mg  daily, to be taken after breakfast. - Stressed the importance of adequate hydration.      Relevant Medications   furosemide (LASIX) 20 MG tablet   valsartan (DIOVAN) 80 MG tablet   pravastatin (PRAVACHOL) 20 MG tablet   Hyperlipidemia with target low density lipoprotein (LDL) cholesterol less than 55 mg/dL (Chronic)    LDL is well controlled with current regimen of Repatha and pravastatin. -Reduce Repatha to once a month until the end of the year  due to patient being in the "donut hole". -Reduce pravastatin to 20mg  daily starting in January.      Relevant Medications   furosemide (LASIX) 20 MG tablet   valsartan (DIOVAN) 80 MG tablet   pravastatin (PRAVACHOL) 20 MG tablet   Myocardial infarction (HCC) (Chronic)   Relevant Medications   furosemide (LASIX) 20 MG tablet   valsartan (DIOVAN) 80 MG tablet   pravastatin (PRAVACHOL) 20 MG tablet     Other   Bilateral lower extremity edema (Chronic)    Patient reports no current edema. -Change Lasix to as needed for weight gain of more than 3 pounds or increased edema. -Only take potassium when taking Lasix.      Chronic hypoxic respiratory failure, on home oxygen therapy (HCC) (Chronic)    Followed by pulmonary medicine.  Will need to reestablish a new pulmonologist since Dr. Craige Cotta is no longer with [facility]. She remains on Arnuity Ellipta and albuterol along with Spiriva.      Lumbar back pain with radiculopathy affecting lower extremity    Patient reports back pain after a rapid movement incident. MRI has been performed but results not discussed in detail during this visit. -Physical therapy likely to be recommended.      Status post coronary artery stent placement (Chronic)    Need to add that she is taking aspirin.           Follow-up Plans -Schedule follow-up with nurse practitioner in 6 months and with physician in 1 year. -Check lipid panel after changes to Repatha and pravastatin dosing. -Continue monitoring blood pressure and adjust medication as needed.   Return in about 6 months (around 04/09/2024) for Alternate 6 month follow-up with APP & MD.  Total time spent: 26 min spent with patient + 16 min spent charting = 42 min     Signed, Marykay Lex, MD, MS Bryan Lemma, M.D., M.S. Interventional Cardiologist  [facility]  Pager # [phone] Phone # [phone] [address] [place], [place] [address]

## 2023-10-14 ENCOUNTER — Encounter: Payer: Self-pay | Admitting: Cardiology

## 2023-10-14 DIAGNOSIS — M5416 Radiculopathy, lumbar region: Secondary | ICD-10-CM | POA: Insufficient documentation

## 2023-10-14 NOTE — Assessment & Plan Note (Addendum)
Really single-vessel disease involving a small diagonal branch.  Years ago she had catheter related dissection of a nondominant RCA but has been patent and follow-up cath. She does have intermittent chest discomfort symptoms which may or may not be true angina versus potential spasm.  Episodes are relieved by nitroglycerin and have improved since starting amlodipine.  We have evaluated a lot of her episodes with ischemic evaluation that they have been negative.  Will hold off on further evaluation at this point.  Plan: She is on bisoprolol-HCTZ 2.5-6.25 mg daily along with low-dose amlodipine 2.5 mg daily and Imdur 60 mg daily for antianginal benefit-relatively well-controlled. Requiring less frequent use of nitroglycerin.  I did recommend that if she takes nitroglycerin that she should double up her Imdur for the next couple days. He is also on valsartan for afterload reduction and we will increase the dose to 80 mg for more blood pressure control. She remains on a combination of pravastatin at 80 mg along with Repatha labs have been pretty well-controlled.  I think we can reduce her pravastatin dose down from 40 mg to 20 mg. We need to confirm that she is actually on aspirin.

## 2023-10-14 NOTE — Assessment & Plan Note (Signed)
Patient reports no current edema. -Change Lasix to as needed for weight gain of more than 3 pounds or increased edema. -Only take potassium when taking Lasix.

## 2023-10-14 NOTE — Assessment & Plan Note (Signed)
Need to add that she is taking aspirin.

## 2023-10-14 NOTE — Assessment & Plan Note (Signed)
Followed by pulmonary medicine.  Will need to reestablish a new pulmonologist since Dr. Craige Cotta is no longer with [facility]. She remains on Arnuity Ellipta and albuterol along with Spiriva.

## 2023-10-14 NOTE — Assessment & Plan Note (Signed)
Patient reports back pain after a rapid movement incident. MRI has been performed but results not discussed in detail during this visit. -Physical therapy likely to be recommended.

## 2023-10-14 NOTE — Assessment & Plan Note (Signed)
Her BP log from home and the initial BP here they were relatively high.  But she also den has some dizzy spells when she wakes up.  This morning she felt dizzy but her blood pressure was 143/63 and heart rate was 63.   Blood pressure readings remain slightly elevated despite current medication regimen. -Increase Valsartan to 80mg  daily, to be taken after breakfast. - Stressed the importance of adequate hydration.

## 2023-10-14 NOTE — Assessment & Plan Note (Signed)
LDL is well controlled with current regimen of Repatha and pravastatin. -Reduce Repatha to once a month until the end of the year due to patient being in the "donut hole". -Reduce pravastatin to 20mg  daily starting in January.

## 2023-10-16 ENCOUNTER — Encounter (INDEPENDENT_AMBULATORY_CARE_PROVIDER_SITE_OTHER): Payer: Self-pay | Admitting: *Deleted

## 2023-10-28 DIAGNOSIS — G4733 Obstructive sleep apnea (adult) (pediatric): Secondary | ICD-10-CM | POA: Diagnosis not present

## 2023-11-10 ENCOUNTER — Other Ambulatory Visit (HOSPITAL_COMMUNITY): Payer: Self-pay | Admitting: Surgery

## 2023-11-10 DIAGNOSIS — M48062 Spinal stenosis, lumbar region with neurogenic claudication: Secondary | ICD-10-CM | POA: Diagnosis not present

## 2023-11-10 DIAGNOSIS — Z6836 Body mass index (BMI) 36.0-36.9, adult: Secondary | ICD-10-CM | POA: Diagnosis not present

## 2023-11-10 DIAGNOSIS — M546 Pain in thoracic spine: Secondary | ICD-10-CM

## 2023-11-21 ENCOUNTER — Ambulatory Visit (HOSPITAL_COMMUNITY)
Admission: RE | Admit: 2023-11-21 | Discharge: 2023-11-21 | Disposition: A | Discharge status: Home / Self Care | Payer: Medicare Other | Source: Ambulatory Visit | Attending: Surgery | Admitting: Surgery

## 2023-11-21 DIAGNOSIS — M47814 Spondylosis without myelopathy or radiculopathy, thoracic region: Secondary | ICD-10-CM | POA: Diagnosis not present

## 2023-11-21 DIAGNOSIS — M546 Pain in thoracic spine: Secondary | ICD-10-CM | POA: Insufficient documentation

## 2023-11-21 DIAGNOSIS — M5134 Other intervertebral disc degeneration, thoracic region: Secondary | ICD-10-CM | POA: Diagnosis not present

## 2023-11-21 DIAGNOSIS — M4804 Spinal stenosis, thoracic region: Secondary | ICD-10-CM | POA: Diagnosis not present

## 2023-11-21 DIAGNOSIS — M5125 Other intervertebral disc displacement, thoracolumbar region: Secondary | ICD-10-CM | POA: Diagnosis not present

## 2023-11-27 DIAGNOSIS — G4733 Obstructive sleep apnea (adult) (pediatric): Secondary | ICD-10-CM | POA: Diagnosis not present

## 2023-11-28 DIAGNOSIS — E1140 Type 2 diabetes mellitus with diabetic neuropathy, unspecified: Secondary | ICD-10-CM | POA: Diagnosis not present

## 2023-11-28 DIAGNOSIS — E1151 Type 2 diabetes mellitus with diabetic peripheral angiopathy without gangrene: Secondary | ICD-10-CM | POA: Diagnosis not present

## 2023-12-11 ENCOUNTER — Ambulatory Visit: Payer: Medicare Other | Admitting: Pulmonary Disease

## 2023-12-11 ENCOUNTER — Encounter: Payer: Self-pay | Admitting: Pulmonary Disease

## 2023-12-11 ENCOUNTER — Ambulatory Visit (HOSPITAL_COMMUNITY): Payer: Medicare Other

## 2023-12-11 VITALS — BP 120/72 | HR 81 | Resp 18 | Ht 60.0 in | Wt 181.4 lb

## 2023-12-11 DIAGNOSIS — J849 Interstitial pulmonary disease, unspecified: Secondary | ICD-10-CM

## 2023-12-11 MED ORDER — DOXYCYCLINE HYCLATE 100 MG PO TABS: 100.0000 mg | ORAL_TABLET | Freq: Two times a day (BID) | ORAL | 0 refills | Status: DC

## 2023-12-11 NOTE — Patient Instructions (Signed)
I will see you about 3 months from here  Schedule for pulmonary function test  Try and use your CPAP on a nightly basis  You can use a mouth tape with your nasal pillows  On nights when you decide to give yourself a break off of CPAP, make sure you use your oxygen supplementation I would rather that you use your CPAP nightly  Graded activities as tolerated  Exercises to strengthen your lower extremity muscles  Will call you in a course of antibiotics  Call us with significant concerns

## 2023-12-11 NOTE — Progress Notes (Signed)
Vickie [name]    [id number]    06-12-1948  Primary Care Physician:Golding, Jonny Ruiz, MD  Referring Physician: Assunta Found, MD [address] [place],  [place] [address]  Chief complaint:   Patient being seen for obstructive sleep apnea, interstitial lung disease  HPI:  Patient recently saw Dr. Craige Cotta about 6 months ago  Post-COVID interstitial lung disease, had COVID January 2021  Cough, shortness of breath, mucus production lately Treated for bronchitis about 3 months ago  Has not been using CPAP on a nightly basis, Sometimes does use oxygen supplementation in place of her CPAP Uses a mouth tape once in a while  Breathing has been relatively stable  Not as active as she used to be  Reformed smoker  History of hypertension, hyperlipidemia, osteoarthritis, GERD, depression, anxiety, left bundle branch block, macular degeneration, type 2 diabetes, coronary artery disease   Outpatient Encounter Medications as of 12/11/2023  Medication Sig   albuterol (PROVENTIL) (2.5 MG/3ML) 0.083% nebulizer solution Take 3 mLs (2.5 mg total) by nebulization every 6 (six) hours as needed for wheezing or shortness of breath.   albuterol (VENTOLIN HFA) 108 (90 Base) MCG/ACT inhaler Inhale 2 puffs into the lungs every 6 (six) hours as needed for wheezing or shortness of breath.   ALPRAZolam (XANAX) 0.5 MG tablet Take 0.25 mg by mouth 2 (two) times daily.   Alum Hydroxide-Mag Trisilicate (GAVISCON) 80-14.2 MG CHEW Chew 2 tablets by mouth at bedtime.   Biotin w/ Vitamins C & E (HAIR/SKIN/NAILS PO) Take 1 tablet by mouth at bedtime.   bisoprolol-hydrochlorothiazide (ZIAC) 2.5-6.25 MG tablet TAKE ONE TABLET BY MOUTH EVERY DAY   busPIRone (BUSPAR) 15 MG tablet Take 15 mg by mouth 2 (two) times daily.   Calcium Carb-Cholecalciferol (CALCIUM 600 + D PO) Take 1 tablet by mouth daily.    Coenzyme Q10 (COQ10) 100 MG CAPS Take 100 mg by mouth every morning.   CRANBERRY PO Take 1 capsule by  mouth daily.    escitalopram (LEXAPRO) 20 MG tablet Take 20 mg by mouth every morning.    esomeprazole (NEXIUM) 20 MG capsule Take 20 mg by mouth daily.   Evolocumab (REPATHA SURECLICK) 140 MG/ML SOAJ Inject 140 mg into the skin every 14 (fourteen) days. (Patient taking differently: Inject 1 mL into the skin every 14 (fourteen) days. Patient was out of medication since march just got new prescription)   Fluticasone Furoate (ARNUITY ELLIPTA) 100 MCG/ACT AEPB Inhale 1 puff into the lungs daily in the afternoon.   furosemide (LASIX) 20 MG tablet Take 1 tablet (20 mg total) by mouth daily as needed.   isosorbide mononitrate (IMDUR) 60 MG 24 hr tablet TAKE ONE TABLET BY MOUTH ONCE DAILY.   mirtazapine (REMERON) 15 MG tablet Take 7.5 mg by mouth at bedtime.    Multiple Vitamin (MULTIVITAMIN) tablet Take 1 tablet by mouth at bedtime.    nitroGLYCERIN (NITROSTAT) 0.4 MG SL tablet PLACE 1 TAB UNDER TONGUE EVERY 5 MIN IF NEEDED FOR CHEST PAIN. MAY USE 3 TIMES.NO RELIEF CALL 911.   potassium chloride SA (KLOR-CON M) 20 MEQ tablet Take 1 tablet (20 mEq total) by mouth daily as needed. Only take when lasix is needed   pravastatin (PRAVACHOL) 20 MG tablet Take 1 tablet (20 mg total) by mouth every evening.   Tiotropium Bromide Monohydrate (SPIRIVA RESPIMAT) 2.5 MCG/ACT AERS Inhale 2 puffs into the lungs daily in the afternoon.   valsartan (DIOVAN) 80 MG tablet Take 1 tablet (  80 mg total) by mouth daily.   amLODipine (NORVASC) 2.5 MG tablet Take 1 tablet (2.5 mg total) by mouth daily.   No facility-administered encounter medications on file as of 12/11/2023.    Allergies as of 12/11/2023 - Review Complete 12/11/2023  Allergen Reaction Noted   Adhesive [tape] Other (See Comments) 07/18/2012   Avelox [moxifloxacin] Anaphylaxis 01/27/2016   Bactrim [sulfamethoxazole-trimethoprim] Anaphylaxis, Shortness Of Breath, Rash, and Other (See Comments) 07/18/2012   Levaquin [levofloxacin] Anaphylaxis, Shortness Of  Breath, Rash, and Other (See Comments) 03/31/2013   Mucinex [guaifenesin er] Anaphylaxis, Shortness Of Breath, and Rash 04/18/2014   Peanut-containing drug products Anaphylaxis 08/12/2020   Penicillins Other (See Comments) 07/18/2012   Sudafed [pseudoephedrine hcl] Shortness Of Breath, Rash, and Other (See Comments) 07/18/2012   Crestor [rosuvastatin] Other (See Comments) 04/15/2014   Lipitor [atorvastatin] Other (See Comments) 04/15/2014   Vytorin [ezetimibe-simvastatin] Other (See Comments) 04/15/2014   Fish allergy Nausea And Vomiting 04/14/2022   Fish oil Nausea And Vomiting 11/25/2020   Norvasc [amlodipine] Other (See Comments) 04/15/2014   Shrimp [shellfish allergy] Nausea And Vomiting 08/12/2020    Past Medical History:  Diagnosis Date   Anginal pain (HCC)    Anxiety    Anxiety disorder    With apparent panic attacks   Arthritis    "hands, knees, back" (09/18/2017)   CAD S/P percutaneous coronary angioplasty 08/2017   Single-vessel CAD involving second diagonal branch treated with DES stent Synergy 2.25 mm x 12 mm (2.4 mm)   Chronic back pain    "all over" (09/18/2017)   COPD (chronic obstructive pulmonary disease) (HCC)    [facility] in [facility]   Depression    Dyspnea    Dysrhythmia    LBBB   Essential hypertension    GERD (gastroesophageal reflux disease)    Hiatal hernia    History of left bundle branch block (LBBB) 08/2017   Rate Related LBBB noted in cath lab   Hyperlipidemia    Macular degeneration    right eye   Myocardial infarction [facility]) 2007   "medically induced"   Nonocclusive coronary atherosclerosis of native coronary artery 2006 through 2012   multi caths 2012, 2006, 2007 2009 (2007 complicated by catheter-induced dissection of small nondominant RCA, patent in 2009 with no residual abnormality); Myoview August 2013: LOW RISK, normal EF. ; Echocardiogram [name] - January 2014) moderate LVH, EF 55-65%. No significant valvular disease.    OSA (obstructive sleep apnea) 9/25/ 2012   tested 2009; tetested sleep study 07/2011--titration 09/20/2011 now use Bi-PAP   OSA on CPAP    Pneumonia    "several times in 2017; 3 times already this year" (09/18/2017)   Scoliosis    Type II diabetes mellitus (HCC)     Past Surgical History:  Procedure Laterality Date   BIOPSY  04/20/2022   Procedure: BIOPSY;  Surgeon: Malissa Hippo, MD;  Location: [facility];  Service: Endoscopy;;   CARDIAC CATHETERIZATION  1610,9604; 2009   Non-occlusice CAD - only 80% ostial SP1;  NON DOMINANNT  RCA (catheter insuced dissection with MI in 2007 --> resolved by 2009 cath)   CATARACT EXTRACTION, BILATERAL Bilateral 2017   Toric lens "in the left eye only"   COLONOSCOPY WITH PROPOFOL N/A 11/09/2018   Procedure: COLONOSCOPY WITH PROPOFOL;  Surgeon: Malissa Hippo, MD;  Location: [facility];  Service: Endoscopy;  Laterality: N/A;  2:25   CORONARY ANGIOPLASTY     x 1 stent  CORONARY PRESSURE/FFR STUDY N/A 09/18/2017   Procedure: INTRAVASCULAR PRESSURE WIRE/FFR STUDY;  Surgeon: Marykay Lex, MD;  Location: [facility] INVASIVE CV LAB;  Service: Cardiovascular: FFR Diag 2 -- 0.78 (significcant) --> PCI   CORONARY STENT INTERVENTION N/A 09/18/2017   Procedure: CORONARY STENT INTERVENTION;  Surgeon: Marykay Lex, MD;  Location: [facility] INVASIVE CV LAB: DES PCI Diag2: Synergy DES 2.25 mm x 12 mm (2.4 mm)   DIAGNOSTIC LAPAROSCOPY Right    DILATION AND CURETTAGE OF UTERUS     ESOPHAGEAL DILATION N/A 04/20/2022   Procedure: ESOPHAGEAL DILATION;  Surgeon: Malissa Hippo, MD;  Location: [facility];  Service: Endoscopy;  Laterality: N/A;   ESOPHAGOGASTRODUODENOSCOPY (EGD) WITH PROPOFOL N/A 04/20/2022   Procedure: ESOPHAGOGASTRODUODENOSCOPY (EGD) WITH PROPOFOL;  Surgeon: Malissa Hippo, MD;  Location: [facility];  Service: Endoscopy;  Laterality: N/A;  240   EYE SURGERY     KNEE ARTHROSCOPY Right    LEFT HEART CATH AND CORONARY ANGIOGRAPHY N/A 09/18/2017    Procedure: LEFT HEART CATH AND CORONARY ANGIOGRAPHY;  Surgeon: Marykay Lex, MD;  Location: [facility] INVASIVE CV LAB: Culpril - 80% Diag2 (FFR 0.78)- PCI.  pLAD 40%, pCx 40%. Post PCI LBBB. LVEDP - ~20 mmHg. EF 55-60%   LIPOMA EXCISION Right ~ 2015   anterior abdomen   NM MYOVIEW LTD  08/04/2021   LOW RISK. EF 55-60%. Mild fixed apical defect - /w breast attenuation.   PARS PLANA VITRECTOMY W/ REPAIR OF MACULAR HOLE Right    Unsuccessful repair.  Hole filled   POLYPECTOMY  11/09/2018   Procedure: POLYPECTOMY;  Surgeon: Malissa Hippo, MD;  [facility];  Service: Endoscopy;;  colon   POLYPECTOMY  04/20/2022   Procedure: POLYPECTOMY;  Surgeon: Malissa Hippo, MD;  [facility];  Service: Endoscopy;;  bx of polyps   TRANSTHORACIC ECHOCARDIOGRAM  08/04/2021   Normal LVEF 55-60%.  Septal movement consistent with LBBB now present.  Unable to assess diastolic function.  Normal RV size and function.  Unable assess PAP.  Normal aortic and mitral valves.  Normal RAP.   VAGINAL HYSTERECTOMY      Family History  Problem Relation Age of Onset   CAD Mother    Breast cancer Maternal Aunt    Breast cancer Paternal Aunt     Social History   Socioeconomic History   Marital status: Married    Spouse name: Not on file   Number of children: Not on file   Years of education: Not on file   Highest education level: Not on file  Occupational History   Not on file  Tobacco Use   Smoking status: Former    Current packs/day: 0.00    Average packs/day: 3.0 packs/day for 8.5 years (25.5 ttl pk-yrs)    Types: Cigarettes    Start date: 10/19/1967    Quit date: 04/18/1976    Years since quitting: 47.6    Passive exposure: Past   Smokeless tobacco: Never  Vaping Use   Vaping status: Never Used  Substance and Sexual Activity   Alcohol use: No   Drug use: No   Sexual activity: Not Currently    Birth control/protection: None  Other Topics Concern   Not on file  Social History  Narrative   Married mother of 4, grandmother of 7. Her mother is 34 years old. Quit smoking 34 years ago. Does not drink alcohol.  Retired from [employer] in 2012.   Usually presents with  oldest daughter.   Previously worked out at [employer] regularly walking 1/2-1 mile a day, but no longer able to do so because of the [employer] decision to no longer cover [employer] cost.      Was made DNI during her hospital stay for COVID-19 in January 2021, now that she has recovered from [facility] and doing well, she has rescinded this and is now full code.   Social Drivers of Corporate investment banker Strain: Low Risk  (11/29/2022)   Overall Financial Resource Strain (CARDIA)    Difficulty of Paying Living Expenses: Not hard at all  Food Insecurity: No Food Insecurity (11/29/2022)   Hunger Vital Sign    Worried About Running Out of Food in the Last Year: Never true    Ran Out of Food in the Last Year: Never true  Transportation Needs: No Transportation Needs (11/29/2022)   PRAPARE - Administrator, Civil Service (Medical): No    Lack of Transportation (Non-Medical): No  Physical Activity: Not on file  Stress: Not on file  Social Connections: Not on file  Intimate Partner Violence: Not on file    Review of Systems  Respiratory:  Positive for apnea, cough and shortness of breath.   Psychiatric/Behavioral:  Positive for sleep disturbance.     Vitals:   12/11/23 0944  BP: 120/72  Pulse: 81  Resp: 18  SpO2: (!) 81%     Physical Exam Constitutional:      Appearance: She is obese.  HENT:     Head: Normocephalic.     Nose: Nose normal.     Mouth/Throat:     Mouth: Mucous membranes are moist.  Eyes:     General: No scleral icterus. Cardiovascular:     Rate and Rhythm: Normal rate and regular rhythm.     Heart sounds: No murmur heard.    No friction rub.  Pulmonary:     Effort: No respiratory distress.     Breath sounds: No stridor. Rales  present. No rhonchi.  Musculoskeletal:     Cervical back: No rigidity or tenderness.  Neurological:     Mental Status: She is alert.  Psychiatric:        Mood and Affect: Mood normal.    Data Reviewed: CT scan of the chest was reviewed by myself showing pulmonary fibrosis  PFT from 2022 compared with previous from 3 years ago showing stable findings  CPAP compliance reviewed showing 57% compliance CPAP of 9 Residual AHI of 3.2  Assessment:  Obstructive sleep apnea on CPAP therapy -Encouraged to use CPAP on a nightly basis  Pulmonary fibrosis -Post-COVID fibrosis -Will plan to repeat PFT  Acute bronchitis -Course of antibiotics will be called in  Deconditioning  Plan/Recommendations: Encouraged to exercise on a regular basis as tolerated  Obtain pulmonary function test  Encouraged to use CPAP on a regular basis, may use CPAP with a mouth tape  When she is not able to use CPAP, encouraged to use oxygen supplementation  Course of doxycycline called in  I will see her back in about 3 months  Importance of regular exercises was stressed Importance of using CPAP on a nightly basis and the risk involved with not using CPAP discussed   Virl Diamond MD [facility] 12/11/2023, 9:51 AM  CC: Assunta Found, MD

## 2023-12-12 ENCOUNTER — Ambulatory Visit
Admission: EM | Admit: 2023-12-12 | Discharge: 2023-12-12 | Disposition: A | Discharge status: Home / Self Care | Payer: Medicare Other | Attending: Nurse Practitioner | Admitting: Nurse Practitioner

## 2023-12-12 ENCOUNTER — Ambulatory Visit (HOSPITAL_COMMUNITY): Payer: Medicare Other

## 2023-12-12 DIAGNOSIS — J069 Acute upper respiratory infection, unspecified: Secondary | ICD-10-CM | POA: Diagnosis not present

## 2023-12-12 LAB — POC COVID19/FLU A&B COMBO
Covid Antigen, POC: NEGATIVE
Influenza A Antigen, POC: NEGATIVE
Influenza B Antigen, POC: NEGATIVE

## 2023-12-12 NOTE — ED Provider Notes (Signed)
[facility]    CSN: [id number] Arrival date & time: 12/12/23  1117      History   Chief Complaint Chief Complaint  Patient presents with   Cough   Headache   Sore Throat    HPI Vickie [name] is a 77 y.o. female.   Patient presents today with 2 to 3-day history of congested cough, runny and stuffy nose, sore throat, headache, vomiting, decreased appetite, and fatigue.  She reports no vomiting since 2 days ago and no headache currently.  No fever, body aches or chills, shortness of breath, wheezing, chest pain or tightness.  Reports that she saw her pulmonologist yesterday and they prescribed her doxycycline which she is taking as prescribed.  Has not taken anything over-the-counter for symptoms.  Reports she was exposed to COVID-19 on Saturday and wants to make sure she does not have it.    Past Medical History:  Diagnosis Date   Anginal pain (HCC)    Anxiety    Anxiety disorder    With apparent panic attacks   Arthritis    "hands, knees, back" (09/18/2017)   CAD S/P percutaneous coronary angioplasty 08/2017   Single-vessel CAD involving second diagonal branch treated with DES stent Synergy 2.25 mm x 12 mm (2.4 mm)   Chronic back pain    "all over" (09/18/2017)   COPD (chronic obstructive pulmonary disease) (HCC)    PFTs were done in 2008 at [facility]   Depression    Dyspnea    Dysrhythmia    LBBB   Essential hypertension    GERD (gastroesophageal reflux disease)    Hiatal hernia    History of left bundle branch block (LBBB) 08/2017   Rate Related LBBB noted in cath lab   Hyperlipidemia    Macular degeneration    right eye   Myocardial infarction [facility]) 2007   "medically induced"   Nonocclusive coronary atherosclerosis of native coronary artery 2006 through 2012   multi caths 2012, 2006, 2007 2009 (2007 complicated by catheter-induced dissection of small nondominant RCA, patent in 2009 with no residual abnormality); Myoview August 2013: LOW RISK,  normal EF. ; Echocardiogram [name] - January 2014) moderate LVH, EF 55-65%. No significant valvular disease.   OSA (obstructive sleep apnea) 9/25/ 2012   tested 2009; tetested sleep study 07/2011--titration 09/20/2011 now use Bi-PAP   OSA on CPAP    Pneumonia    "several times in 2017; 3 times already this year" (09/18/2017)   Scoliosis    Type II diabetes mellitus [facility])     Patient Active Problem List   Diagnosis Date Noted   Lumbar back pain with radiculopathy affecting lower extremity 10/14/2023   History of colonic polyps    Left bundle branch block (LBBB) determined by electrocardiography 07/26/2021   Esophageal dysphagia 01/20/2021   Chest wall pain 09/01/2020   Goals of care, counseling/discussion 12/31/2019   COVID-19    Acute respiratory failure with hypoxia (HCC) 12/30/2019   Chronic hypoxic respiratory failure, on home oxygen therapy (HCC)    Increased oxygen demand    Pneumonia due to COVID-19 virus 12/28/2019   Thrombocytopenia (HCC) 12/28/2019   Acute hypoxemic respiratory failure due to COVID-19 (HCC) 12/27/2019   Chest pain with moderate risk for cardiac etiology 10/04/2017   Hypotension 10/02/2017   Status post coronary artery stent placement    Pre-op evaluation 09/05/2017   Special screening for malignant neoplasms, colon 07/31/2017   Elevated CK 07/30/2017   DOE (dyspnea on exertion)  07/28/2017   Bilateral lower extremity edema 09/10/2016   Claudication (HCC) 06/18/2015   Obesity (BMI 30-39.9) 04/19/2014   Coronary artery disease involving native coronary artery of native heart with angina pectoris (HCC)    Hyperlipidemia with target low density lipoprotein (LDL) cholesterol less than 55 mg/dL    Dysphagia 78/29/5621   Hypothyroidism 04/19/2008   DIABETES MELLITUS 04/19/2008   ANXIETY DEPRESSION 04/19/2008   Essential hypertension 04/19/2008   Myocardial infarction (HCC) 04/19/2008   GERD (gastroesophageal reflux disease) 04/19/2008   ISCHEMIC COLITIS  04/19/2008   ARTHRITIS 04/19/2008    Past Surgical History:  Procedure Laterality Date   BIOPSY  04/20/2022   Procedure: BIOPSY;  Surgeon: Malissa Hippo, MD;  [facility];  Service: Endoscopy;;   CARDIAC CATHETERIZATION  3086,[id number]; 2009   Non-occlusice CAD - only 80% ostial SP1;  NON DOMINANNT  RCA (catheter insuced dissection with MI in 2007 --> resolved by 2009 cath)   CATARACT EXTRACTION, BILATERAL Bilateral 2017   Toric lens "in the left eye only"   COLONOSCOPY WITH PROPOFOL N/A 11/09/2018   Procedure: COLONOSCOPY WITH PROPOFOL;  Surgeon: Malissa Hippo, MD;  [facility]: [facility];  Service: Endoscopy;  Laterality: N/A;  2:25   CORONARY ANGIOPLASTY     x 1 stent   CORONARY PRESSURE/FFR STUDY N/A 09/18/2017   Procedure: INTRAVASCULAR PRESSURE WIRE/FFR STUDY;  Surgeon: Marykay Lex, MD;  Location: [facility] INVAS[facility] CV LAB;  Service: Cardiovascular: FFR Diag 2 -- 0.78 (significcant) --> PCI   CORONARY STENT INTERVENTION N/A 09/18/2017   Procedure: CORONARY STENT INTERVENTION;  Surgeon: Marykay Lex, MD;  Location: [facility] INVASIVE CV LAB: DES PCI Diag2: Synergy DES 2.25 mm x 12 mm (2.4 mm)   DIAGNOSTIC LAPAROSCOPY Right    DILATION AND CURETTAGE OF UTERUS     ESOPHAGEAL DILATION N/A 04/20/2022   Procedure: ESOPHAGEAL DILATION;  Surgeon: Malissa Hippo, MD;  [facility];  Service: Endoscopy;  Laterality: N/A;   ESOPHAGOGASTRODUODENOSCOPY (EGD) WITH PROPOFOL N/A 04/20/2022   Procedure: ESOPHAGOGASTRODUODENOSCOPY (EGD) WITH PROPOFOL;  Surgeon: Malissa Hippo, MD;  [facility];  Service: Endoscopy;  Laterality: N/A;  240   EYE SURGERY     KNEE ARTHROSCOPY Right    LEFT HEART CATH AND CORONARY ANGIOGRAPHY N/A 09/18/2017   Procedure: LEFT HEART CATH AND CORONARY ANGIOGRAPHY;  Surgeon: Marykay Lex, MD;  Location: [facility] INVASIVE CV LAB: Culpril - 80% Diag2 (FFR 0.78)- PCI.  pLAD 40%, pCx 40%. Post PCI LBBB. LVEDP - ~20 mmHg. EF 55-60%   LIPOMA  EXCISION Right ~ 2015   anterior abdomen   NM MYOVIEW LTD  08/04/2021   LOW RISK. EF 55-60%. Mild fixed apical defect - /w breast attenuation.   PARS PLANA VITRECTOMY W/ REPAIR OF MACULAR HOLE Right    Unsuccessful repair.  Hole filled   POLYPECTOMY  11/09/2018   Procedure: POLYPECTOMY;  Surgeon: Malissa Hippo, MD;  [facility];  Service: Endoscopy;;  colon   POLYPECTOMY  04/20/2022   Procedure: POLYPECTOMY;  Surgeon: Malissa Hippo, MD;  [facility];  Service: Endoscopy;;  bx of polyps   TRANSTHORACIC ECHOCARDIOGRAM  08/04/2021   Normal LVEF 55-60%.  Septal movement consistent with LBBB now present.  Unable to assess diastolic function.  Normal RV size and function.  Unable assess PAP.  Normal aortic and mitral valves.  Normal RAP.   VAGINAL HYSTERECTOMY      OB History   No obstetric  history on file.      Home Medications    Prior to Admission medications   Medication Sig Start Date End Date Taking? Authorizing Provider  albuterol (PROVENTIL) (2.5 MG/3ML) 0.083% nebulizer solution Take 3 mLs (2.5 mg total) by nebulization every 6 (six) hours as needed for wheezing or shortness of breath. 09/29/22  Yes Coralyn Helling, MD  albuterol (VENTOLIN HFA) 108 (90 Base) MCG/ACT inhaler Inhale 2 puffs into the lungs every 6 (six) hours as needed for wheezing or shortness of breath. 09/29/22  Yes Coralyn Helling, MD  ALPRAZolam Prudy Feeler) 0.5 MG tablet Take 0.25 mg by mouth 2 (two) times daily.   Yes [provider]  Alum Hydroxide-Mag Trisilicate (GAVISCON) 80-14.2 MG CHEW Chew 2 tablets by mouth at bedtime.   Yes [provider]  Biotin w/ Vitamins C & E (HAIR/SKIN/NAILS PO) Take 1 tablet by mouth at bedtime.   Yes [provider]  bisoprolol-hydrochlorothiazide [facility]) 2.5-6.25 MG tablet TAKE ONE TABLET BY MOUTH EVERY DAY 08/14/23  Yes Marykay Lex, MD  busPIRone (BUSPAR) 15 MG tablet Take 15 mg by mouth 2 (two) times daily.   Yes [provider]  Calcium Carb-Cholecalciferol (CALCIUM 600 + D PO) Take 1 tablet by mouth daily.    Yes [provider]  Coenzyme Q10 (COQ10) 100 MG CAPS Take 100 mg by mouth every morning.   Yes [provider]  CRANBERRY PO Take 1 capsule by mouth daily.    Yes [provider]  doxycycline (VIBRA-TABS) 100 MG tablet Take 1 tablet (100 mg total) by mouth 2 (two) times daily. 12/11/23  Yes Olalere, Adewale A, MD  escitalopram (LEXAPRO) 20 MG tablet Take 20 mg by mouth every morning.    Yes [provider]  Evolocumab (REPATHA SURECLICK) 140 MG/ML SOAJ Inject 140 mg into the skin every 14 (fourteen) days. Patient taking differently: Inject 1 mL into the skin every 14 (fourteen) days. Patient was out of medication since march just got new prescription 07/13/23  Yes [place], Peter M, MD  Fluticasone Furoate (ARNUITY ELLIPTA) 100 MCG/ACT AEPB Inhale 1 puff into the lungs daily in the afternoon. 04/28/23  Yes Coralyn Helling, MD  furosemide (LASIX) 20 MG tablet Take 1 tablet (20 mg total) by mouth daily as needed. 10/10/23  Yes Marykay Lex, MD  isosorbide mononitrate (IMDUR) 60 MG 24 hr tablet TAKE ONE TABLET BY MOUTH ONCE DAILY. 08/31/23  Yes Marykay Lex, MD  mirtazapine (REMERON) 15 MG tablet Take 7.5 mg by mouth at bedtime.    Yes [provider]  Multiple Vitamin (MULTIVITAMIN) tablet Take 1 tablet by mouth at bedtime.    Yes [provider]  potassium chloride SA (KLOR-CON M) 20 MEQ tablet Take 1 tablet (20 mEq total) by mouth daily as needed. Only take when lasix is needed 10/10/23  Yes Marykay Lex, MD  pravastatin (PRAVACHOL) 20 MG tablet Take 1 tablet (20 mg total) by mouth every evening. 10/10/23 01/08/24 Yes Marykay Lex, MD  Tiotropium Bromide Monohydrate (SPIRIVA RESPIMAT) 2.5 MCG/ACT AERS Inhale 2 puffs into the lungs daily in the afternoon. 07/03/23  Yes Coralyn Helling, MD  valsartan (DIOVAN) 80 MG tablet Take 1 tablet (80 mg total)  by mouth daily. 10/10/23  Yes Marykay Lex, MD  amLODipine (NORVASC) 2.5 MG tablet Take 1 tablet (2.5 mg total) by mouth daily. 07/18/23 10/16/23  Marykay Lex, MD  esomeprazole (NEXIUM) 20 MG capsule Take 20 mg by mouth daily.  [provider]  nitroGLYCERIN (NITROSTAT) 0.4 MG SL tablet PLACE 1 TAB UNDER TONGUE EVERY 5 MIN IF NEEDED FOR CHEST PAIN. MAY USE 3 TIMES.NO RELIEF CALL 911. 07/18/23   Marykay Lex, MD    Family History Family History  Problem Relation Age of Onset   CAD Mother    Breast cancer Maternal Aunt    Breast cancer Paternal Aunt     Social History Social History   Tobacco Use   Smoking status: Former    Current packs/day: 0.00    Average packs/day: 3.0 packs/day for 8.5 years (25.5 ttl pk-yrs)    Types: Cigarettes    Start date: 10/19/1967    Quit date: 04/18/1976    Years since quitting: 47.6    Passive exposure: Past   Smokeless tobacco: Never  Vaping Use   Vaping status: Never Used  Substance Use Topics   Alcohol use: No   Drug use: No     Allergies   Adhesive [tape], Avelox [moxifloxacin], Bactrim [sulfamethoxazole-trimethoprim], Levaquin [levofloxacin], Mucinex [guaifenesin er], Peanut-containing drug products, Penicillins, Sudafed [pseudoephedrine hcl], Crestor [rosuvastatin], Lipitor [atorvastatin], Vytorin [ezetimibe-simvastatin], Fish allergy, Fish oil, Norvasc [amlodipine], and Shrimp [shellfish allergy]   Review of Systems Review of Systems Per HPI  Physical Exam Triage Vital Signs ED Triage Vitals  Encounter Vitals Group     BP 12/12/23 1156 128/62     Systolic BP Percentile --      Diastolic BP Percentile --      Pulse Rate 12/12/23 1156 (!) 57     Resp 12/12/23 1156 (!) 23     Temp 12/12/23 1156 97.9 F (36.6 C)     Temp Source 12/12/23 1156 Oral     SpO2 12/12/23 1156 (!) 89 %     Weight --      Height --      Head Circumference --      Peak Flow --      Pain Score 12/12/23 1203 0     Pain Loc --       Pain Education --      Exclude from Growth Chart --    No data found.  Updated Vital Signs BP 128/62 (BP Location: Right Arm)   Pulse (!) 57   Temp 97.9 F (36.6 C) (Oral)   Resp (!) 23   SpO2 93%   Visual Acuity Right Eye Distance:   Left Eye Distance:   Bilateral Distance:    Right Eye Near:   Left Eye Near:    Bilateral Near:     Physical Exam Vitals and nursing note reviewed.  Constitutional:      General: She is not in acute distress.    Appearance: Normal appearance. She is not ill-appearing or toxic-appearing.  HENT:     Head: Normocephalic and atraumatic.     Right Ear: Tympanic membrane, ear canal and external ear normal.     Left Ear: Tympanic membrane, ear canal and external ear normal.     Nose: Congestion present. No rhinorrhea.     Mouth/Throat:     Mouth: Mucous membranes are moist.     Pharynx: Oropharynx is clear. Posterior oropharyngeal erythema present. No oropharyngeal exudate.  Eyes:     General: No scleral icterus.    Extraocular Movements: Extraocular movements intact.  Cardiovascular:     Rate and Rhythm: Normal rate and regular rhythm.  Pulmonary:     Effort: Pulmonary effort is normal. No respiratory distress.     Breath  sounds: Rales present. No wheezing or rhonchi.  Musculoskeletal:     Cervical back: Normal range of motion and neck supple.  Lymphadenopathy:     Cervical: No cervical adenopathy.  Skin:    General: Skin is warm and dry.     Coloration: Skin is not jaundiced or pale.     Findings: No erythema or rash.  Neurological:     Mental Status: She is alert and oriented to person, place, and time.  Psychiatric:        Behavior: Behavior is cooperative.      UC Treatments / Results  Labs (all labs ordered are listed, but only abnormal results are displayed) Labs Reviewed  POC COVID19/FLU A&B COMBO    EKG   Radiology No results found.  Procedures Procedures (including critical care time)  Medications Ordered in  UC Medications - No data to display  Initial Impression / Assessment and Plan / UC Course  I have reviewed the triage vital signs and the nursing notes.  Pertinent labs & imaging results that were available during my care of the patient were reviewed by me and considered in my medical decision making (see chart for details).  In triage, patient is well-appearing.  She is normotensive, afebrile, and SpO2 is at her baseline.  She is slightly bradycardic which appears to be near her baseline and respiratory rate is initially increased, which resolves after rest.  1. Acute upper respiratory infection Patient is being treated with doxycycline by pulmonology COVID-19 is negative as well as influenza today, patient provided results and reassured Other supportive care discussed including increased water, start Mucinex Return and ER precautions discussed with patient  The patient was given the opportunity to ask questions.  All questions answered to their satisfaction.  The patient is in agreement to this plan.    Final Clinical Impressions(s) / UC Diagnoses   Final diagnoses:  Acute upper respiratory infection     Discharge Instructions      You have a viral upper respiratory infection.  Symptoms should improve over the next week to 10 days.  If you develop chest pain or shortness of breath, go to the emergency room.  COVID-19 and flu tests are negative.   Some things that can make you feel better are: - Increased rest - Increasing fluid with water/sugar free electrolytes - Acetaminophen and ibuprofen as needed for fever/pain - Salt water gargling, chloraseptic spray and throat lozenges - OTC guaifenesin (Mucinex) 600 mg twice daily for congestion - Saline sinus flushes or a neti pot - Humidifying the air     ED Prescriptions   None    PDMP not reviewed this encounter.   Valentino Nose, NP 12/12/23 1241

## 2023-12-12 NOTE — ED Triage Notes (Signed)
Pt states she found out she was expose to COVID. this morning.Pt would like to be tested for COVID.  Pt states she had headache and cough x 3 days.  Pt was prescribed doxycycline yesterday for her cough and wheezing.

## 2023-12-12 NOTE — Discharge Instructions (Signed)
You have a viral upper respiratory infection.  Symptoms should improve over the next week to 10 days.  If you develop chest pain or shortness of breath, go to the emergency room.  COVID-19 and flu tests are negative.   Some things that can make you feel better are: - Increased rest - Increasing fluid with water/sugar free electrolytes - Acetaminophen and ibuprofen as needed for fever/pain - Salt water gargling, chloraseptic spray and throat lozenges - OTC guaifenesin (Mucinex) 600 mg twice daily for congestion - Saline sinus flushes or a neti pot - Humidifying the air

## 2023-12-13 DIAGNOSIS — M47814 Spondylosis without myelopathy or radiculopathy, thoracic region: Secondary | ICD-10-CM | POA: Diagnosis not present

## 2023-12-13 DIAGNOSIS — Z6836 Body mass index (BMI) 36.0-36.9, adult: Secondary | ICD-10-CM | POA: Diagnosis not present

## 2023-12-13 DIAGNOSIS — M546 Pain in thoracic spine: Secondary | ICD-10-CM | POA: Diagnosis not present

## 2024-01-03 ENCOUNTER — Other Ambulatory Visit: Payer: Self-pay

## 2024-01-03 ENCOUNTER — Ambulatory Visit (HOSPITAL_COMMUNITY): Payer: Medicare Other | Attending: Surgery

## 2024-01-03 DIAGNOSIS — M6281 Muscle weakness (generalized): Secondary | ICD-10-CM | POA: Diagnosis not present

## 2024-01-03 DIAGNOSIS — M5459 Other low back pain: Secondary | ICD-10-CM | POA: Diagnosis not present

## 2024-01-03 DIAGNOSIS — M542 Cervicalgia: Secondary | ICD-10-CM | POA: Insufficient documentation

## 2024-01-03 DIAGNOSIS — R293 Abnormal posture: Secondary | ICD-10-CM | POA: Insufficient documentation

## 2024-01-03 DIAGNOSIS — M546 Pain in thoracic spine: Secondary | ICD-10-CM | POA: Diagnosis not present

## 2024-01-03 NOTE — Therapy (Signed)
 OUTPATIENT PHYSICAL THERAPY EVALUATION (THORACOLUMBAR)   Patient Name: Vickie [name] MRN: [id number] DOB:September 15, 1946, 78 y.o., female Today's Date: 01/03/2024  END OF SESSION:   PT End of Session - 01/03/24 0952     Visit Number 1    Number of Visits 8    Authorization Type BCBS Medicare    Authorization Time Period Auth requested 1/8    PT Start Time 1000    PT Stop Time 1040    PT Time Calculation (min) 40 min    Activity Tolerance Patient tolerated treatment well    Behavior During Therapy WFL for tasks assessed/performed              Past Medical History:  Diagnosis Date   Anginal pain (HCC)    Anxiety    Anxiety disorder    With apparent panic attacks   Arthritis    hands, knees, back (09/18/2017)   CAD S/P percutaneous coronary angioplasty 08/2017   Single-vessel CAD involving second diagonal branch treated with DES stent Synergy 2.25 mm x 12 mm (2.4 mm)   Chronic back pain    all over (09/18/2017)   COPD (chronic obstructive pulmonary disease) (HCC)    PFTs were done in 2008 at [facility]   Depression    Dyspnea    Dysrhythmia    LBBB   Essential hypertension    GERD (gastroesophageal reflux disease)    Hiatal hernia    History of left bundle branch block (LBBB) 08/2017   Rate Related LBBB noted in cath lab   Hyperlipidemia    Macular degeneration    right eye   Myocardial infarction [facility]) 2007   medically induced   Nonocclusive coronary atherosclerosis of native coronary artery 2006 through 2012   multi caths 2012, 2006, 2007 2009 (2007 complicated by catheter-induced dissection of small nondominant RCA, patent in 2009 with no residual abnormality); Myoview  August 2013: LOW RISK, normal EF. ; Echocardiogram [facility] - January 2014) moderate LVH, EF 55-65%. No significant valvular disease.   OSA (obstructive sleep apnea) 9/25/ 2012   tested 2009; tetested sleep study 07/2011--titration 09/20/2011 now use Bi-PAP   OSA on CPAP    Pneumonia     several times in 2017; 3 times already this year (09/18/2017)   Scoliosis    Type II diabetes mellitus [facility])    Past Surgical History:  Procedure Laterality Date   BIOPSY  04/20/2022   Procedure: BIOPSY;  Surgeon: Golda Claudis PENNER, MD;  [facility];  Service: Endoscopy;;   CARDIAC CATHETERIZATION  [id number]; 2009   Non-occlusice CAD - only 80% ostial SP1;  NON DOMINANNT  RCA (catheter insuced dissection with MI in 2007 --> resolved by 2009 cath)   CATARACT EXTRACTION, BILATERAL Bilateral 2017   Toric lens in the left eye only   COLONOSCOPY WITH PROPOFOL  N/A 11/09/2018   Procedure: COLONOSCOPY WITH PROPOFOL ;  Surgeon: Golda Claudis PENNER, MD;  [facility];  Service: Endoscopy;  Laterality: N/A;  2:25   CORONARY ANGIOPLASTY     x 1 stent   CORONARY PRESSURE/FFR STUDY N/A 09/18/2017   Procedure: INTRAVASCULAR PRESSURE WIRE/FFR STUDY;  Surgeon: Anner Alm ORN, MD;  Location: [facility] INVASIVE CV LAB;  Service: Cardiovascular: FFR Diag 2 -- 0.78 (significcant) --> PCI   CORONARY STENT INTERVENTION N/A 09/18/2017   Procedure: CORONARY STENT INTERVENTION;  Surgeon: Anner Alm ORN, MD;  Location: [facility] INVASIVE CV LAB: DES PCI Diag2: Synergy DES 2.25 mm x 12 mm (2.4 mm)  DIAGNOSTIC LAPAROSCOPY Right    DILATION AND CURETTAGE OF UTERUS     ESOPHAGEAL DILATION N/A 04/20/2022   Procedure: ESOPHAGEAL DILATION;  Surgeon: Golda Claudis PENNER, MD;  [facility];  Service: Endoscopy;  Laterality: N/A;   ESOPHAGOGASTRODUODENOSCOPY (EGD) WITH PROPOFOL  N/A 04/20/2022   Procedure: ESOPHAGOGASTRODUODENOSCOPY (EGD) WITH PROPOFOL ;  Surgeon: Golda Claudis PENNER, MD;  [facility];  Service: Endoscopy;  Laterality: N/A;  240   EYE SURGERY     KNEE ARTHROSCOPY Right    LEFT HEART CATH AND CORONARY ANGIOGRAPHY N/A 09/18/2017   Procedure: LEFT HEART CATH AND CORONARY ANGIOGRAPHY;  Surgeon: Anner Alm ORN, MD;  Location: [facility] INVASIVE CV LAB: Culpril - 80% Diag2 (FFR 0.78)- PCI.   pLAD 40%, pCx 40%. Post PCI LBBB. LVEDP - ~20 mmHg. EF 55-60%   LIPOMA EXCISION Right ~ 2015   anterior abdomen   NM MYOVIEW  LTD  08/04/2021   LOW RISK. EF 55-60%. Mild fixed apical defect - /w breast attenuation.   PARS PLANA VITRECTOMY W/ REPAIR OF MACULAR HOLE Right    Unsuccessful repair.  Hole filled   POLYPECTOMY  11/09/2018   Procedure: POLYPECTOMY;  Surgeon: Golda Claudis PENNER, MD;  [facility];  Service: Endoscopy;;  colon   POLYPECTOMY  04/20/2022   Procedure: POLYPECTOMY;  Surgeon: Golda Claudis PENNER, MD;  [facility];  Service: Endoscopy;;  bx of polyps   TRANSTHORACIC ECHOCARDIOGRAM  08/04/2021   Normal LVEF 55-60%.  Septal movement consistent with LBBB now present.  Unable to assess diastolic function.  Normal RV size and function.  Unable assess PAP.  Normal aortic and mitral valves.  Normal RAP.   VAGINAL HYSTERECTOMY     Patient Active Problem List   Diagnosis Date Noted   Lumbar back pain with radiculopathy affecting lower extremity 10/14/2023   History of colonic polyps    Left bundle branch block (LBBB) determined by electrocardiography 07/26/2021   Esophageal dysphagia 01/20/2021   Chest wall pain 09/01/2020   Goals of care, counseling/discussion 12/31/2019   COVID-19    Acute respiratory failure with hypoxia (HCC) 12/30/2019   Chronic hypoxic respiratory failure, on home oxygen  therapy (HCC)    Increased oxygen  demand    Pneumonia due to COVID-19 virus 12/28/2019   Thrombocytopenia (HCC) 12/28/2019   Acute hypoxemic respiratory failure due to COVID-19 [facility]) 12/27/2019   Chest pain with moderate risk for cardiac etiology 10/04/2017   Hypotension 10/02/2017   Status post coronary artery stent placement    Pre-op evaluation 09/05/2017   Special screening for malignant neoplasms, colon 07/31/2017   Elevated CK 07/30/2017   DOE (dyspnea on exertion) 07/28/2017   Bilateral lower extremity edema 09/10/2016   Claudication (HCC) 06/18/2015    Obesity (BMI 30-39.9) 04/19/2014   Coronary artery disease involving native coronary artery of native heart with angina pectoris (HCC)    Hyperlipidemia with target low density lipoprotein (LDL) cholesterol less than 55 mg/dL    Dysphagia 97/76/7984   Hypothyroidism 04/19/2008   DIABETES MELLITUS 04/19/2008   ANXIETY DEPRESSION 04/19/2008   Essential hypertension 04/19/2008   Myocardial infarction (HCC) 04/19/2008   GERD (gastroesophageal reflux disease) 04/19/2008   ISCHEMIC COLITIS 04/19/2008   ARTHRITIS 04/19/2008    PCP: Marvine Rush, MDPCP - General   REFERRING PROVIDER: Johnanna Camie Mates, PA-CRef Provider   REFERRING DIAG:  Diagnosis  M54.6 (ICD-10-CM) - Pain in thoracic spine    Rationale for Evaluation and Treatment: Rehabilitation  THERAPY DIAG:  Pain in thoracic  spine  Other low back pain  Muscle weakness (generalized)  ONSET DATE: September 2024     SUBJECTIVE:                                                                                                                                                                                           SUBJECTIVE STATEMENT: Patient reports that 4 month ago, she was getting off an ambulance. The EMT was helping patient come off of the ambulance and dropped the patient, as she reports. This pain persisted for a year until patient went to [facility] September- November 2024. MD's performed MRI of lumbar and upper back, see below. Patient was prescribed medicine and was referred to [facility].    PERTINENT HISTORY:  CORONARY STENT INTERVENTION  2018 LEFT HEART CATH AND CORONARY ANGIOGRAPHY  2018 COPD( uses supplemental O2 @ home) Macular degeneration in the right eye  CPAP use at night     PAIN:  Are you having pain? Yes: NPRS scale: 6/10 Pain location: starts at upper back down to low back  Pain description: sharp, constant Aggravating factors: standing, lifting, bending  Relieving factors: n/a  PRECAUTIONS:  None  RED FLAGS: None   WEIGHT BEARING RESTRICTIONS: No  FALLS:  Has patient fallen in last 6 months? Yes. Number of falls 1  LIVING ENVIRONMENT: Lives with: lives with their spouse Lives in: House/apartment Stairs: No Has following equipment at home: Single point cane, Environmental Consultant - 2 wheeled, Tour manager, and Grab bars How many hours do you sleep every night: </= 6 hours due to pain    OCCUPATION: retired  PLOF: Independent   PATIENT GOALS: To be able to do ADLs pain-free  NEXT MD VISIT: TBD    OBJECTIVE:   DIAGNOSTIC FINDINGS:  Lumbar    IMPRESSION: 1. Transitional lumbosacral anatomy. For the purposes of this dictation, L5 is partially sacralized. Correlation with radiographs is recommended prior to any operative intervention. 2. At L2-L3, moderate canal stenosis 3. At L4-L5, severe facet arthropathy with grade 1 anterolisthesis and mild canal stenosis. 4. Additional multilevel mild foraminal and canal stenosis is detailed above  Thoracic  Predominantly mild disc bulging throughout the thoracic spine. Moderate to severe facet arthrosis throughout the lower thoracic spine. Mild spinal stenosis and moderate left greater than right neural foraminal stenosis at T10-11 due to disc bulging    PATIENT SURVEYS:  Modified Oswestry Low Back Pain Disability Questionnaire: 30 / 50 = 60.0 %  SCREENING FOR RED FLAGS: Bowel or bladder incontinence: No Spinal tumors: No Cauda equina syndrome: No Compression fracture: No Abdominal aneurysm: No  COGNITION: Overall cognitive status: Within functional limits  for tasks assessed  POSTURE: decreased lumbar lordosis and increased thoracic kyphosis      FUNCTIONAL TESTS:  5 times sit to stand: 14s using moderate upper extremity push; slow & steady with pain, contact guard assist   .20-29 years: 6.0  1.4 seconds 30-39 years: 6.1  1.4 seconds 40-49 years: 7.6  1.8 seconds 50-59 years: 7.7  2.6 seconds 60-69  years: 8.4  0.0 seconds (female), 12.7  1.8 seconds (female) 70-79 years: 11.6  3.4 seconds (female), 13.0  4.8 seconds (female) 80-89 years: 16.7  4.5 seconds (female), 17.2  5.5 seconds (female) 90+ years: 19.5  2.3 seconds (female), 22.9  9.6 seconds (female)  The Minimal Clinically Important Difference (MCID) is 2.3 seconds.  A score of 16 seconds or less indicates that the participant is not likely to fall.  A score of more than 16 seconds indicates a higher risk of falls  As per American Physical Therapy Association (APTA) website   GAIT ANALYSIS: Distance walked: 25 ft Assistive device utilized: None Level of assistance: SBA Comments: Patient with moderate unsteadiness; left antalgia   SENSATION: WFL   LOWER EXTREMITY MMT:     MMT Right eval Left eval  Hip flexion 2+/5 2+/5  Hip extension    Hip abduction    Hip adduction    Hip internal rotation    Hip external rotation    Knee flexion 3+/5  3-/5  Knee extension 3+/5 3-/5  Ankle dorsiflexion    Ankle plantarflexion    Ankle inversion    Ankle eversion     (Blank rows = not tested)  PALPATION: Moderate tenderness to palpation TH/ LU paraspinals     TODAY'S TREATMENT:                                                                                                                              DATE:   01/03/24 PT I.E. Supine  LTR's into PT resistance 2x10  Ball Squeezes 10x 3-5 H  Partial hip marches x10 each   PATIENT EDUCATION:  Education details: HEP Person educated: Patient Education method: Explanation Education comprehension: verbalized understanding  HOME EXERCISE PROGRAM: Access Code: [email or web address] URL: [email or web address] Date: 01/03/2024 Prepared by: Deward Ming  Exercises - Lower Trunk Rotations  - 1-2 x daily - 20 reps - Supine March  - 1-2 x daily - 20 reps    ASSESSMENT:  CLINICAL IMPRESSION: Patient is a 78 y.o. y.o. female who was seen today for physical  therapy evaluation and treatment for thoracic/lumbar pain, muscle weakness . Patient presents to PT with the following objective impairments: Abnormal gait, decreased activity tolerance, decreased endurance, decreased mobility, difficulty walking, decreased strength, increased muscle spasms, improper body mechanics, postural dysfunction, and pain. These impairments limit the patient in activities such as carrying, lifting, bending, sitting, standing, squatting, sleeping, stairs, locomotion level, and caring for others. These impairments also limit the patient in participation such as meal prep, cleaning, laundry, interpersonal  relationship, driving, shopping, and community activity. The patient will benefit from PT to address the limitations/impairments listed below to return to their prior level of function in the domains of activity and participation.    PERSONAL FACTORS: Fitness are also affecting patient's functional outcome.   REHAB POTENTIAL: Fair    CLINICAL DECISION MAKING: Stable/uncomplicated  EVALUATION COMPLEXITY: Low    GOALS: Goals reviewed with patient? No  SHORT TERM GOALS: Target date: 01/24/2024   1. Patient will be independent with a basic stretching/strengthening HEP  Baseline:  Goal status: INITIAL  2.   Patient will complete the 5 times sit to stand:  within 1 minute to demonstrate an improvement lower extremity strength needed for home and community ambulation  Baseline:  Goal status: INITIAL   LONG TERM GOALS: Target date: 02/14/2024    Patient will be able to score a </= 50% on the Modified Oswestry    to demonstrate an improvement in overall housework, ADL completion, mobility, and self-care.Baseline:  Goal status: INITIAL  2.   Patient will complete the 5 times sit to stand:  within 50 seconds  to demonstrate an improvement lower extremity strength needed for home and community ambulation  Baseline:  Goal status: INITIAL  3.  Patient will be independent  with a comprehensive strengthening HEP  Baseline:  Goal status: INITIAL    PLAN:  PT FREQUENCY: 1-2x/week  PT DURATION: 6 weeks  PLANNED INTERVENTIONS: 97110-Therapeutic exercises, 97530- Therapeutic activity, 97112- Neuromuscular re-education, 97535- Self Care, [id number]- Manual therapy, [id number]- Gait training, [id number]- Electrical stimulation (unattended), [id number]- Electrical stimulation (manual), Patient/Family education, Stair training, Dry Needling, Joint mobilization, Joint manipulation, Spinal manipulation, Spinal mobilization, DME instructions, Cryotherapy, and Moist heat.  PLAN FOR NEXT SESSION: Progress core/lumbar stability; progress lower extremity strengthening      Deward Ming, PT 01/03/2024, 10:55 AM

## 2024-01-08 ENCOUNTER — Ambulatory Visit (HOSPITAL_COMMUNITY): Payer: Medicare Other

## 2024-01-08 DIAGNOSIS — M546 Pain in thoracic spine: Secondary | ICD-10-CM

## 2024-01-08 DIAGNOSIS — R293 Abnormal posture: Secondary | ICD-10-CM | POA: Diagnosis not present

## 2024-01-08 DIAGNOSIS — M5459 Other low back pain: Secondary | ICD-10-CM

## 2024-01-08 DIAGNOSIS — M6281 Muscle weakness (generalized): Secondary | ICD-10-CM

## 2024-01-08 DIAGNOSIS — M542 Cervicalgia: Secondary | ICD-10-CM | POA: Diagnosis not present

## 2024-01-08 NOTE — Therapy (Signed)
 OUTPATIENT PHYSICAL THERAPY EVALUATION (THORACOLUMBAR)   Patient Name: Vickie [name] MRN: [id number] DOB:08/21/1946, 78 y.o., female Today's Date: 01/08/2024  END OF SESSION:   PT End of Session - 01/08/24 1009     Visit Number 2    Number of Visits 8    Authorization Type BCBS Medicare    Authorization Time Period no auth req    PT Start Time 1010    PT Stop Time 1050    PT Time Calculation (min) 40 min    Activity Tolerance Patient tolerated treatment well    Behavior During Therapy WFL for tasks assessed/performed              Past Medical History:  Diagnosis Date   Anginal pain (HCC)    Anxiety    Anxiety disorder    With apparent panic attacks   Arthritis    hands, knees, back (09/18/2017)   CAD S/P percutaneous coronary angioplasty 08/2017   Single-vessel CAD involving second diagonal branch treated with DES stent Synergy 2.25 mm x 12 mm (2.4 mm)   Chronic back pain    all over (09/18/2017)   COPD (chronic obstructive pulmonary disease) (HCC)    PFTs were done in 2008 at [facility]   Depression    Dyspnea    Dysrhythmia    LBBB   Essential hypertension    GERD (gastroesophageal reflux disease)    Hiatal hernia    History of left bundle branch block (LBBB) 08/2017   Rate Related LBBB noted in cath lab   Hyperlipidemia    Macular degeneration    right eye   Myocardial infarction [facility]) 2007   medically induced   Nonocclusive coronary atherosclerosis of native coronary artery 2006 through 2012   multi caths 2012, 2006, 2007 2009 (2007 complicated by catheter-induced dissection of small nondominant RCA, patent in 2009 with no residual abnormality); Myoview  August 2013: LOW RISK, normal EF. ; Echocardiogram [facility] - January 2014) moderate LVH, EF 55-65%. No significant valvular disease.   OSA (obstructive sleep apnea) 9/25/ 2012   tested 2009; tetested sleep study 07/2011--titration 09/20/2011 now use Bi-PAP   OSA on CPAP    Pneumonia    several  times in 2017; 3 times already this year (09/18/2017)   Scoliosis    Type II diabetes mellitus [facility])    Past Surgical History:  Procedure Laterality Date   BIOPSY  04/20/2022   Procedure: BIOPSY;  Surgeon: Golda Claudis PENNER, MD;  [facility];  Service: Endoscopy;;   CARDIAC CATHETERIZATION  [id number]; 2009   Non-occlusice CAD - only 80% ostial SP1;  NON DOMINANNT  RCA (catheter insuced dissection with MI in 2007 --> resolved by 2009 cath)   CATARACT EXTRACTION, BILATERAL Bilateral 2017   Toric lens in the left eye only   COLONOSCOPY WITH PROPOFOL  N/A 11/09/2018   Procedure: COLONOSCOPY WITH PROPOFOL ;  Surgeon: Golda Claudis PENNER, MD;  [facility]: [facility];  Service: Endoscopy;  Laterality: N/A;  2:25   CORONARY ANGIOPLASTY     x 1 stent   CORONARY PRESSURE/FFR STUDY N/A 09/18/2017   Procedure: INTRAVASCULAR PRESSURE WIRE/FFR STUDY;  Surgeon: Anner Alm ORN, MD;  Location: [facility] INVASIVE CV LAB;  Service: Cardiovascular: FFR Diag 2 -- 0.78 (significcant) --> PCI   CORONARY STENT INTERVENTION N/A 09/18/2017   Procedure: CORONARY STENT INTERVENTION;  Surgeon: Anner Alm ORN, MD;  Location: [facility] INVASIVE CV LAB: DES PCI Diag2: Synergy DES 2.25 mm x 12 mm (2.4 mm)  DIAGNOSTIC LAPAROSCOPY Right    DILATION AND CURETTAGE OF UTERUS     ESOPHAGEAL DILATION N/A 04/20/2022   Procedure: ESOPHAGEAL DILATION;  Surgeon: Golda Claudis PENNER, MD;  [facility];  Service: Endoscopy;  Laterality: N/A;   ESOPHAGOGASTRODUODENOSCOPY (EGD) WITH PROPOFOL  N/A 04/20/2022   Procedure: ESOPHAGOGASTRODUODENOSCOPY (EGD) WITH PROPOFOL ;  Surgeon: Golda Claudis PENNER, MD;  [facility]: [facility];  Service: Endoscopy;  Laterality: N/A;  240   EYE SURGERY     KNEE ARTHROSCOPY Right    LEFT HEART CATH AND CORONARY ANGIOGRAPHY N/A 09/18/2017   Procedure: LEFT HEART CATH AND CORONARY ANGIOGRAPHY;  Surgeon: Anner Alm ORN, MD;  Location: [facility] INVASIVE CV LAB: Culpril - 80% Diag2 (FFR 0.78)- PCI.  pLAD 40%,  pCx 40%. Post PCI LBBB. LVEDP - ~20 mmHg. EF 55-60%   LIPOMA EXCISION Right ~ 2015   anterior abdomen   NM MYOVIEW  LTD  08/04/2021   LOW RISK. EF 55-60%. Mild fixed apical defect - /w breast attenuation.   PARS PLANA VITRECTOMY W/ REPAIR OF MACULAR HOLE Right    Unsuccessful repair.  Hole filled   POLYPECTOMY  11/09/2018   Procedure: POLYPECTOMY;  Surgeon: Golda Claudis PENNER, MD;  [facility];  Service: Endoscopy;;  colon   POLYPECTOMY  04/20/2022   Procedure: POLYPECTOMY;  Surgeon: Golda Claudis PENNER, MD;  [facility];  Service: Endoscopy;;  bx of polyps   TRANSTHORACIC ECHOCARDIOGRAM  08/04/2021   Normal LVEF 55-60%.  Septal movement consistent with LBBB now present.  Unable to assess diastolic function.  Normal RV size and function.  Unable assess PAP.  Normal aortic and mitral valves.  Normal RAP.   VAGINAL HYSTERECTOMY     Patient Active Problem List   Diagnosis Date Noted   Lumbar back pain with radiculopathy affecting lower extremity 10/14/2023   History of colonic polyps    Left bundle branch block (LBBB) determined by electrocardiography 07/26/2021   Esophageal dysphagia 01/20/2021   Chest wall pain 09/01/2020   Goals of care, counseling/discussion 12/31/2019   COVID-19    Acute respiratory failure with hypoxia (HCC) 12/30/2019   Chronic hypoxic respiratory failure, on home oxygen  therapy (HCC)    Increased oxygen  demand    Pneumonia due to COVID-19 virus 12/28/2019   Thrombocytopenia (HCC) 12/28/2019   Acute hypoxemic respiratory failure due to COVID-19 [facility]) 12/27/2019   Chest pain with moderate risk for cardiac etiology 10/04/2017   Hypotension 10/02/2017   Status post coronary artery stent placement    Pre-op evaluation 09/05/2017   Special screening for malignant neoplasms, colon 07/31/2017   Elevated CK 07/30/2017   DOE (dyspnea on exertion) 07/28/2017   Bilateral lower extremity edema 09/10/2016   Claudication (HCC) 06/18/2015   Obesity (BMI  30-39.9) 04/19/2014   Coronary artery disease involving native coronary artery of native heart with angina pectoris (HCC)    Hyperlipidemia with target low density lipoprotein (LDL) cholesterol less than 55 mg/dL    Dysphagia 97/76/7984   Hypothyroidism 04/19/2008   DIABETES MELLITUS 04/19/2008   ANXIETY DEPRESSION 04/19/2008   Essential hypertension 04/19/2008   Myocardial infarction (HCC) 04/19/2008   GERD (gastroesophageal reflux disease) 04/19/2008   ISCHEMIC COLITIS 04/19/2008   ARTHRITIS 04/19/2008    PCP: Marvine Rush, MDPCP - General   REFERRING PROVIDER: Johnanna Camie Mates, PA-CRef Provider   REFERRING DIAG:  Diagnosis  M54.6 (ICD-10-CM) - Pain in thoracic spine    Rationale for Evaluation and Treatment: Rehabilitation  THERAPY DIAG:  Other low back  pain  Pain in thoracic spine  Muscle weakness (generalized)  ONSET DATE: September 2024     SUBJECTIVE:                                                                                                                                                                                           SUBJECTIVE STATEMENT: Today: Patient reports 4/10 LBP today.    I.E: Patient reports that 4 month ago, she was getting off an ambulance. The EMT was helping patient come off of the ambulance and dropped the patient, as she reports. This pain persisted for a year until patient went to [facility] September- November 2024. MD's performed MRI of lumbar and upper back, see below. Patient was prescribed medicine and was referred to [facility].    PERTINENT HISTORY:  CORONARY STENT INTERVENTION  2018 LEFT HEART CATH AND CORONARY ANGIOGRAPHY  2018 COPD( uses supplemental O2 @ home) Macular degeneration in the right eye  CPAP use at night     PAIN:  Are you having pain? Yes: NPRS scale: 6/10 Pain location: starts at upper back down to low back  Pain description: sharp, constant Aggravating factors: standing, lifting, bending  Relieving  factors: n/a  PRECAUTIONS: None  RED FLAGS: None   WEIGHT BEARING RESTRICTIONS: No  FALLS:  Has patient fallen in last 6 months? Yes. Number of falls 1  LIVING ENVIRONMENT: Lives with: lives with their spouse Lives in: House/apartment Stairs: No Has following equipment at home: Single point cane, Environmental Consultant - 2 wheeled, Tour manager, and Grab bars How many hours do you sleep every night: </= 6 hours due to pain    OCCUPATION: retired  PLOF: Independent   PATIENT GOALS: To be able to do ADLs pain-free  NEXT MD VISIT: TBD    OBJECTIVE:   DIAGNOSTIC FINDINGS:  Lumbar    IMPRESSION: 1. Transitional lumbosacral anatomy. For the purposes of this dictation, L5 is partially sacralized. Correlation with radiographs is recommended prior to any operative intervention. 2. At L2-L3, moderate canal stenosis 3. At L4-L5, severe facet arthropathy with grade 1 anterolisthesis and mild canal stenosis. 4. Additional multilevel mild foraminal and canal stenosis is detailed above  Thoracic  Predominantly mild disc bulging throughout the thoracic spine. Moderate to severe facet arthrosis throughout the lower thoracic spine. Mild spinal stenosis and moderate left greater than right neural foraminal stenosis at T10-11 due to disc bulging    PATIENT SURVEYS:  Modified Oswestry Low Back Pain Disability Questionnaire: 30 / 50 = 60.0 %  SCREENING FOR RED FLAGS: Bowel or bladder incontinence: No Spinal tumors: No Cauda equina syndrome: No Compression fracture: No Abdominal  aneurysm: No  COGNITION: Overall cognitive status: Within functional limits for tasks assessed  POSTURE: decreased lumbar lordosis and increased thoracic kyphosis      FUNCTIONAL TESTS:  5 times sit to stand: 14s using moderate upper extremity push; slow & steady with pain, contact guard assist   .20-29 years: 6.0  1.4 seconds 30-39 years: 6.1  1.4 seconds 40-49 years: 7.6  1.8 seconds 50-59  years: 7.7  2.6 seconds 60-69 years: 8.4  0.0 seconds (female), 12.7  1.8 seconds (female) 70-79 years: 11.6  3.4 seconds (female), 13.0  4.8 seconds (female) 80-89 years: 16.7  4.5 seconds (female), 17.2  5.5 seconds (female) 90+ years: 19.5  2.3 seconds (female), 22.9  9.6 seconds (female)  The Minimal Clinically Important Difference (MCID) is 2.3 seconds.  A score of 16 seconds or less indicates that the participant is not likely to fall.  A score of more than 16 seconds indicates a higher risk of falls  As per American Physical Therapy Association (APTA) website   GAIT ANALYSIS: Distance walked: 25 ft Assistive device utilized: None Level of assistance: SBA Comments: Patient with moderate unsteadiness; left antalgia   SENSATION: WFL   LOWER EXTREMITY MMT:     MMT Right eval Left eval  Hip flexion 2+/5 2+/5  Hip extension    Hip abduction    Hip adduction    Hip internal rotation    Hip external rotation    Knee flexion 3+/5  3-/5  Knee extension 3+/5 3-/5  Ankle dorsiflexion    Ankle plantarflexion    Ankle inversion    Ankle eversion     (Blank rows = not tested)  PALPATION: Moderate tenderness to palpation TH/ LU paraspinals     TODAY'S TREATMENT:                                                                                                                              DATE:   01/08/24 Supine  LTRs into PT contact  Ball squeezes 10x3-5H  Hip abduction with G theraband 2x10  Hip marches with G theraband 2x10 Seated  Sitting theraband extension with G theraband x10  Seated pallof presses with G theraband x5 each Standing  Step to 4 step + theraband extension G theraband , PT contact guard assist   Weighted sled push with 40# 69ftx2, PT stand-by assist       01/03/24 PT I.E. Supine  LTR's into PT resistance 2x10  Ball Squeezes 10x 3-5 H  Partial hip marches x10 each Seated  Forward flexion stretch with XL Physioball 10x3H   PATIENT  EDUCATION:  Education details: HEP Person educated: Patient Education method: Explanation Education comprehension: verbalized understanding  HOME EXERCISE PROGRAM: Access Code: [email or web address] URL: [email or web address] Date: 01/03/2024 Prepared by: Deward Ming  Exercises - Lower Trunk Rotations  - 1-2 x daily - 20 reps - Supine March  - 1-2 x daily - 20 reps    ASSESSMENT:  CLINICAL IMPRESSION: Today: PT used beginning of session to review HEP with patient; requires minimal verbal cues for proper form during HEP. PT session then focused on core/lumbar stability using theraband into an anti-rotation direction. PT then finished session in standing position focusing on Therapeutic Activity such as step ups and weighted sled pushes to work on ADL completion @ home. Patient able to tolerate exercises well with no increase in pain levels.  The patient will benefit from PT to address the limitations/impairments listed below to return to their prior level of function in the domains of activity and participation.   I.E. Patient is a 78 y.o. y.o. female who was seen today for physical therapy evaluation and treatment for thoracic/lumbar pain, muscle weakness . Patient presents to PT with the following objective impairments: Abnormal gait, decreased activity tolerance, decreased endurance, decreased mobility, difficulty walking, decreased strength, increased muscle spasms, improper body mechanics, postural dysfunction, and pain. These impairments limit the patient in activities such as carrying, lifting, bending, sitting, standing, squatting, sleeping, stairs, locomotion level, and caring for others. These impairments also limit the patient in participation such as meal prep, cleaning, laundry, interpersonal relationship, driving, shopping, and community activity. The patient will benefit from PT to address the limitations/impairments listed below to return to their prior level of function in  the domains of activity and participation.    PERSONAL FACTORS: Fitness are also affecting patient's functional outcome.   REHAB POTENTIAL: Fair    CLINICAL DECISION MAKING: Stable/uncomplicated  EVALUATION COMPLEXITY: Low    GOALS: Goals reviewed with patient? No  SHORT TERM GOALS: Target date: 01/24/2024   1. Patient will be independent with a basic stretching/strengthening HEP  Baseline:  Goal status: INITIAL  2.   Patient will complete the 5 times sit to stand:  within 1 minute to demonstrate an improvement lower extremity strength needed for home and community ambulation  Baseline:  Goal status: INITIAL   LONG TERM GOALS: Target date: 02/14/2024    Patient will be able to score a </= 50% on the Modified Oswestry    to demonstrate an improvement in overall housework, ADL completion, mobility, and self-care.Baseline:  Goal status: INITIAL  2.   Patient will complete the 5 times sit to stand:  within 50 seconds  to demonstrate an improvement lower extremity strength needed for home and community ambulation  Baseline:  Goal status: INITIAL  3.  Patient will be independent with a comprehensive strengthening HEP  Baseline:  Goal status: INITIAL    PLAN:  PT FREQUENCY: 1-2x/week  PT DURATION: 6 weeks  PLANNED INTERVENTIONS: 97110-Therapeutic exercises, 97530- Therapeutic activity, 97112- Neuromuscular re-education, 97535- Self Care, [id number]- Manual therapy, [id number]- Gait training, [id number]- Electrical stimulation (unattended), [phone]- Electrical stimulation (manual), Patient/Family education, Stair training, Dry Needling, Joint mobilization, Joint manipulation, Spinal manipulation, Spinal mobilization, DME instructions, Cryotherapy, and Moist heat.  PLAN FOR NEXT SESSION: Progress core/lumbar stability; reconditioning/ lower extremity strength      Deward Ming, PT 01/08/2024, 10:10 AM

## 2024-01-10 ENCOUNTER — Encounter (HOSPITAL_COMMUNITY): Payer: Medicare Other | Admitting: Physical Therapy

## 2024-01-10 ENCOUNTER — Ambulatory Visit (HOSPITAL_COMMUNITY): Payer: Medicare Other | Admitting: Physical Therapy

## 2024-01-10 DIAGNOSIS — M542 Cervicalgia: Secondary | ICD-10-CM | POA: Diagnosis not present

## 2024-01-10 DIAGNOSIS — M546 Pain in thoracic spine: Secondary | ICD-10-CM

## 2024-01-10 DIAGNOSIS — M5459 Other low back pain: Secondary | ICD-10-CM

## 2024-01-10 DIAGNOSIS — M6281 Muscle weakness (generalized): Secondary | ICD-10-CM | POA: Diagnosis not present

## 2024-01-10 DIAGNOSIS — R293 Abnormal posture: Secondary | ICD-10-CM

## 2024-01-10 NOTE — Therapy (Signed)
 OUTPATIENT PHYSICAL THERAPY EVALUATION (THORACOLUMBAR)   Patient Name: Vickie [name] MRN: [id number] DOB:Mar 29, 1946, 78 y.o., female Today's Date: 01/10/2024  END OF SESSION:   PT End of Session - 01/10/24 1358     Visit Number 3    Number of Visits 8    Authorization Type BCBS Medicare    Authorization Time Period no auth req    PT Start Time 1348    PT Stop Time 1430    PT Time Calculation (min) 42 min    Activity Tolerance Patient tolerated treatment well    Behavior During Therapy WFL for tasks assessed/performed              Past Medical History:  Diagnosis Date   Anginal pain (HCC)    Anxiety    Anxiety disorder    With apparent panic attacks   Arthritis    "hands, knees, back" (09/18/2017)   CAD S/P percutaneous coronary angioplasty 08/2017   Single-vessel CAD involving second diagonal branch treated with DES stent Synergy 2.25 mm x 12 mm (2.4 mm)   Chronic back pain    "all over" (09/18/2017)   COPD (chronic obstructive pulmonary disease) (HCC)    PFT[facility] were done in [facility]   Depression    Dyspnea    Dysrhythmia    LBBB   Essential hypertension    GERD (gastroesophageal reflux disease)    Hiatal hernia    History of left bundle branch block (LBBB) 08/2017   Rate Related LBBB noted in cath lab   Hyperlipidemia    Macular degeneration    right eye   Myocardial infarction [facility]) 2007   "medically induced"   Nonocclusive coronary atherosclerosis of native coronary artery 2006 through 2012   multi caths 2012, 2006, 2007 2009 (2007 complicated by catheter-induced dissection of small nondominant RCA, patent in 2009 with no residual abnormality); Myoview  August 2013: LOW RISK, normal EF. ; Echocardiogram [facility] - January 2014) moderate LVH, EF 55-65%. No significant valvular disease.   OSA (obstructive sleep apnea) 9/25/ 2012   tested 2009; tetested sleep study 07/2011--titration 09/20/2011 now use Bi-PAP   OSA on CPAP    Pneumonia    "several  times in 2017; 3 times already this year" (09/18/2017)   Scoliosis    Type II diabetes mellitus (HCC)    Past Surgical History:  Procedure Laterality Date   BIOPSY  04/20/2022   Procedure: BIOPSY;  Surgeon: Ruby Corporal, MD;  Location: [facility];  Service: Endoscopy;;   CARDIAC CATHETERIZATION  6213,0865; 2009   Non-occlusice CAD - only 80% ostial SP1;  NON DOMINANNT  RCA (catheter insuced dissection with MI in 2007 --> resolved by 2009 cath)   CATARACT EXTRACTION, BILATERAL Bilateral 2017   Toric lens "in the left eye only"   COLONOSCOPY WITH PROPOFOL  N/A 11/09/2018   Procedure: COLONOSCOPY WITH PROPOFOL ;  Surgeon: Ruby Corporal, MD;  Location: [facility];  Service: Endoscopy;  Laterality: N/A;  2:25   CORONARY ANGIOPLASTY     x 1 stent   CORONARY PRESSURE/FFR STUDY N/A 09/18/2017   Procedure: INTRAVASCULAR PRESSURE WIRE/FFR STUDY;  Surgeon: Arleen Lacer, MD;  Location: [facility] INVASIVE CV LAB;  Service: Cardiovascular: FFR Diag 2 -- 0.78 (significcant) --> PCI   CORONARY STENT INTERVENTION N/A 09/18/2017   Procedure: CORONARY STENT INTERVENTION;  Surgeon: Arleen Lacer, MD;  Location: [facility] INVASIVE CV LAB: DES PCI Diag2: Synergy DES 2.25 mm x 12 mm (2.4 mm)  DIAGNOSTIC LAPAROSCOPY Right    DILATION AND CURETTAGE OF UTERUS     ESOPHAGEAL DILATION N/A 04/20/2022   Procedure: ESOPHAGEAL DILATION;  Surgeon: Ruby Corporal, MD;  [facility]: [facility];  Service: Endoscopy;  Laterality: N/A;   ESOPHAGOGASTRODUODENOSCOPY (EGD) WITH PROPOFOL  N/A 04/20/2022   Procedure: ESOPHAGOGASTRODUODENOSCOPY (EGD) WITH PROPOFOL ;  Surgeon: Ruby Corporal, MD;  Location: [facility];  Service: Endoscopy;  Laterality: N/A;  240   EYE SURGERY     KNEE ARTHROSCOPY Right    LEFT HEART CATH AND CORONARY ANGIOGRAPHY N/A 09/18/2017   Procedure: LEFT HEART CATH AND CORONARY ANGIOGRAPHY;  Surgeon: Arleen Lacer, MD;  Location: [facility] INVASIVE CV LAB: Culpril - 80% Diag2 (FFR 0.78)- PCI.  pLAD 40%,  pCx 40%. Post PCI LBBB. LVEDP - ~20 mmHg. EF 55-60%   LIPOMA EXCISION Right ~ 2015   anterior abdomen   NM MYOVIEW  LTD  08/04/2021   LOW RISK. EF 55-60%. Mild fixed apical defect - /w breast attenuation.   PARS PLANA VITRECTOMY W/ REPAIR OF MACULAR HOLE Right    Unsuccessful repair.  Hole filled   POLYPECTOMY  11/09/2018   Procedure: POLYPECTOMY;  Surgeon: Ruby Corporal, MD;  [facility]: [facility];  Service: Endoscopy;;  colon   POLYPECTOMY  04/20/2022   Procedure: POLYPECTOMY;  Surgeon: Ruby Corporal, MD;  Location: [facility];  Service: Endoscopy;;  bx of polyps   TRANSTHORACIC ECHOCARDIOGRAM  08/04/2021   Normal LVEF 55-60%.  Septal movement consistent with LBBB now present.  Unable to assess diastolic function.  Normal RV size and function.  Unable assess PAP.  Normal aortic and mitral valves.  Normal RAP.   VAGINAL HYSTERECTOMY     Patient Active Problem List   Diagnosis Date Noted   Lumbar back pain with radiculopathy affecting lower extremity 10/14/2023   History of colonic polyps    Left bundle branch block (LBBB) determined by electrocardiography 07/26/2021   Esophageal dysphagia 01/20/2021   Chest wall pain 09/01/2020   Goals of care, counseling/discussion 12/31/2019   COVID-19    Acute respiratory failure with hypoxia (HCC) 12/30/2019   Chronic hypoxic respiratory failure, on home oxygen  therapy (HCC)    Increased oxygen  demand    Pneumonia due to COVID-19 virus 12/28/2019   Thrombocytopenia (HCC) 12/28/2019   Acute hypoxemic respiratory failure due to COVID-19 [facility]) 12/27/2019   Chest pain with moderate risk for cardiac etiology 10/04/2017   Hypotension 10/02/2017   Status post coronary artery stent placement    Pre-op evaluation 09/05/2017   Special screening for malignant neoplasms, colon 07/31/2017   Elevated CK 07/30/2017   DOE (dyspnea on exertion) 07/28/2017   Bilateral lower extremity edema 09/10/2016   Claudication (HCC) 06/18/2015   Obesity (BMI  30-39.9) 04/19/2014   Coronary artery disease involving native coronary artery of native heart with angina pectoris (HCC)    Hyperlipidemia with target low density lipoprotein (LDL) cholesterol less than 55 mg/dL    Dysphagia 13/07/6577   Hypothyroidism 04/19/2008   DIABETES MELLITUS 04/19/2008   ANXIETY DEPRESSION 04/19/2008   Essential hypertension 04/19/2008   Myocardial infarction (HCC) 04/19/2008   GERD (gastroesophageal reflux disease) 04/19/2008   ISCHEMIC COLITIS 04/19/2008   ARTHRITIS 04/19/2008    PCP: Minus Amel, MDPCP - General   REFERRING PROVIDER: Assunta Lax, PA-CRef Provider   REFERRING DIAG:  Diagnosis  M54.6 (ICD-10-CM) - Pain in thoracic spine    Rationale for Evaluation and Treatment: Rehabilitation  THERAPY DIAG:  Other low back  pain  Pain in thoracic spine  Muscle weakness (generalized)  Cervicalgia  Abnormal posture  ONSET DATE: September 2024     SUBJECTIVE:                                                                                                                                                                                           SUBJECTIVE STATEMENT: Today: Patient reports her pain is up to 7/10 today.  Believes the pushing activity was too much last visit.  Noted forward bent antalgic gait.     I.E: Patient reports that 4 month ago, she was getting off an ambulance. The EMT was helping patient come off of the ambulance and "dropped" the patient, as she reports. This pain persisted for a year until patient went to [facility] September- November 2024. MD's performed MRI of lumbar and upper back, see below. Patient was prescribed medicine and was referred to [facility].    PERTINENT HISTORY:  CORONARY STENT INTERVENTION  2018 LEFT HEART CATH AND CORONARY ANGIOGRAPHY  2018 COPD( uses supplemental O2 @ home) Macular degeneration in the right eye  CPAP use at night     PAIN:  Are you having pain? Yes: NPRS scale: 6/10 Pain  location: starts at upper back down to low back  Pain description: sharp, constant Aggravating factors: standing, lifting, bending  Relieving factors: n/a  PRECAUTIONS: None  RED FLAGS: None   WEIGHT BEARING RESTRICTIONS: No  FALLS:  Has patient fallen in last 6 months? Yes. Number of falls 1  LIVING ENVIRONMENT: Lives with: lives with their spouse Lives in: House/apartment Stairs: No Has following equipment at home: Single point cane, Environmental consultant - 2 wheeled, Tour manager, and Grab bars How many hours do you sleep every night: </= 6 hours due to pain    OCCUPATION: retired  PLOF: Independent   PATIENT GOALS: To be able to do ADLs pain-free  NEXT MD VISIT: TBD    OBJECTIVE:   DIAGNOSTIC FINDINGS:  Lumbar    IMPRESSION: 1. Transitional lumbosacral anatomy. For the purposes of this dictation, L5 is partially sacralized. Correlation with radiographs is recommended prior to any operative intervention. 2. At L2-L3, moderate canal stenosis 3. At L4-L5, severe facet arthropathy with grade 1 anterolisthesis and mild canal stenosis. 4. Additional multilevel mild foraminal and canal stenosis is detailed above  Thoracic  Predominantly mild disc bulging throughout the thoracic spine. Moderate to severe facet arthrosis throughout the lower thoracic spine. Mild spinal stenosis and moderate left greater than right neural foraminal stenosis at T10-11 due to disc bulging    PATIENT SURVEYS:  Modified Oswestry Low Back Pain Disability Questionnaire: 30 /  50 = 60.0 %  SCREENING FOR RED FLAGS: Bowel or bladder incontinence: No Spinal tumors: No Cauda equina syndrome: No Compression fracture: No Abdominal aneurysm: No  COGNITION: Overall cognitive status: Within functional limits for tasks assessed  POSTURE: decreased lumbar lordosis and increased thoracic kyphosis      FUNCTIONAL TESTS:  5 times sit to stand: 14s using moderate upper extremity push; slow &  steady with pain, contact guard assist   .20-29 years: 6.0  1.4 seconds 30-39 years: 6.1  1.4 seconds 40-49 years: 7.6  1.8 seconds 50-59 years: 7.7  2.6 seconds 60-69 years: 8.4  0.0 seconds (female), 12.7  1.8 seconds (female) 70-79 years: 11.6  3.4 seconds (female), 13.0  4.8 seconds (female) 80-89 years: 16.7  4.5 seconds (female), 17.2  5.5 seconds (female) 90+ years: 19.5  2.3 seconds (female), 22.9  9.6 seconds (female)  The Minimal Clinically Important Difference (MCID) is 2.3 seconds.  A score of 16 seconds or less indicates that the participant is not likely to fall.  A score of more than 16 seconds indicates a higher risk of falls  As per American Physical Therapy Association (APTA) website   GAIT ANALYSIS: Distance walked: 25 ft Assistive device utilized: None Level of assistance: SBA Comments: Patient with moderate unsteadiness; left antalgia   SENSATION: WFL   LOWER EXTREMITY MMT:     MMT Right eval Left eval  Hip flexion 2+/5 2+/5  Hip extension    Hip abduction    Hip adduction    Hip internal rotation    Hip external rotation    Knee flexion 3+/5  3-/5  Knee extension 3+/5 3-/5  Ankle dorsiflexion    Ankle plantarflexion    Ankle inversion    Ankle eversion     (Blank rows = not tested)  PALPATION: Moderate tenderness to palpation TH/ LU paraspinals     TODAY'S TREATMENT:                                                                                                                              DATE:  01/10/24 Seated: thoracic extension with pillow in lower back 2X5 with 5" holds  Abdominal bracing march alternating 10X  Abdominal bracing LAQ alternating 10X  W backs 10X Standing: Doorway chest stretch 3X20"  Red theraband rows 10X  Red theraband shoulder extensions 10X  01/08/24 Supine  LTRs into PT contact  Ball squeezes 10x3-5"H  Hip abduction with G theraband 2x10  Hip marches with G theraband 2x10 Seated  Sitting theraband  extension with G theraband x10  Seated pallof presses with G theraband x5 each Standing  Step to 4" step + theraband extension G theraband , PT contact guard assist   Weighted sled push with 40# 65ftx2, PT stand-by assist   01/03/24 PT I.E. Supine  LTR's into PT resistance 2x10  Ball Squeezes 10x 3-5" H  Partial hip marches x10 each Seated  Forward flexion stretch with  XL Physioball 10x3"H   PATIENT EDUCATION:  Education details: HEP Person educated: Patient Education method: Explanation Education comprehension: verbalized understanding  HOME EXERCISE PROGRAM: Access Code:   URL: [email or web address] Date: 01/03/2024 Prepared by: Nelma Band  Exercises - Lower Trunk Rotations  - 1-2 x daily - 20 reps - Supine March  - 1-2 x daily - 20 reps  Date: 01/10/2024 Prepared by: Vickii Grand - Seated Thoracic Extension AROM  - 2 x daily - 7 x weekly - 1 sets - 10 reps - 5 sec hold - Doorway Pec Stretch at 90 Degrees Abduction  - 2 x daily - 7 x weekly - 1 sets - 3 reps - 20 second hold - Standing Row with Resistance with Anchored Resistance at Chest Height Palms Down  - 2 x daily - 7 x weekly - 1 sets - 10 reps - Shoulder Extension with Resistance  - 2 x daily - 7 x weekly - 1 sets - 10 reps  ASSESSMENT:  CLINICAL IMPRESSION: PT with increased pain and discomfort today.  Focused session today on reducing pain and improving posture as pt was forward bent with slow antalgic gait when entered clinic today.   Began with seated thoracic extension with pillow in lumbar region for support. Pt reported tremendous relief from this activity.  Encouraged to complete at least 2X daily.  Worked on general posture and educated on need to stretch chest, increase upper back strength to improve.  Added doorway stretch modified for Rt shoulder dysfunction.  Began postural strengthening using red theraband and given all these to add to HEP this session.  Discontinued sled push as pt  reported this activity aggrevated her symptoms.  Continue to progress slowly and within limits of pain control.  Pt may benefit from decompression exercsies as well. Reduction of pain to 3 /10 at end of session.   I.E. Patient is a 78 y.o. y.o. female who was seen today for physical therapy evaluation and treatment for thoracic/lumbar pain, muscle weakness . Patient presents to PT with the following objective impairments: Abnormal gait, decreased activity tolerance, decreased endurance, decreased mobility, difficulty walking, decreased strength, increased muscle spasms, improper body mechanics, postural dysfunction, and pain. These impairments limit the patient in activities such as carrying, lifting, bending, sitting, standing, squatting, sleeping, stairs, locomotion level, and caring for others. These impairments also limit the patient in participation such as meal prep, cleaning, laundry, interpersonal relationship, driving, shopping, and community activity. The patient will benefit from PT to address the limitations/impairments listed below to return to their prior level of function in the domains of activity and participation.    PERSONAL FACTORS: Fitness are also affecting patient's functional outcome.   REHAB POTENTIAL: Fair    CLINICAL DECISION MAKING: Stable/uncomplicated  EVALUATION COMPLEXITY: Low    GOALS: Goals reviewed with patient? No  SHORT TERM GOALS: Target date: 01/24/2024   1. Patient will be independent with a basic stretching/strengthening HEP  Baseline:  Goal status: INITIAL  2.   Patient will complete the 5 times sit to stand:  within 1 minute to demonstrate an improvement lower extremity strength needed for home and community ambulation  Baseline:  Goal status: INITIAL   LONG TERM GOALS: Target date: 02/14/2024    Patient will be able to score a </= 50% on the Modified Oswestry    to demonstrate an improvement in overall housework, ADL completion, mobility,  and self-care.Baseline:  Goal status: INITIAL  2.   Patient will complete the 5 times  sit to stand:  within 50 seconds  to demonstrate an improvement lower extremity strength needed for home and community ambulation  Baseline:  Goal status: INITIAL  3.  Patient will be independent with a comprehensive strengthening HEP  Baseline:  Goal status: INITIAL    PLAN:  PT FREQUENCY: 1-2x/week  PT DURATION: 6 weeks  PLANNED INTERVENTIONS: 97110-Therapeutic exercises, 97530- Therapeutic activity, 97112- Neuromuscular re-education, 97535- Self Care, 62952- Manual therapy, [id number]- Gait training, 97014- Electrical stimulation (unattended), [phone]- Electrical stimulation (manual), Patient/Family education, Stair training, Dry Needling, Joint mobilization, Joint manipulation, Spinal manipulation, Spinal mobilization, DME instructions, Cryotherapy, and Moist heat.  PLAN FOR NEXT SESSION: Progress core/lumbar stability; reconditioning/ lower extremity strength.  Pt would benefit from decompression exercises if symptoms persist next session.     Vickii Grand B, PTA 01/10/2024, 1:59 PM

## 2024-01-15 ENCOUNTER — Ambulatory Visit (HOSPITAL_COMMUNITY): Payer: Medicare Other

## 2024-01-15 DIAGNOSIS — M5459 Other low back pain: Secondary | ICD-10-CM

## 2024-01-15 DIAGNOSIS — M546 Pain in thoracic spine: Secondary | ICD-10-CM

## 2024-01-15 DIAGNOSIS — M6281 Muscle weakness (generalized): Secondary | ICD-10-CM | POA: Diagnosis not present

## 2024-01-15 DIAGNOSIS — R293 Abnormal posture: Secondary | ICD-10-CM | POA: Diagnosis not present

## 2024-01-15 DIAGNOSIS — M542 Cervicalgia: Secondary | ICD-10-CM | POA: Diagnosis not present

## 2024-01-15 NOTE — Therapy (Signed)
OUTPATIENT PHYSICAL THERAPY EVALUATION (THORACOLUMBAR)   Patient Name: Vickie [name] MRN: [id number] DOB:09/21/1946, 78 y.o., female Today's Date: 01/15/2024  END OF SESSION:   PT End of Session - 01/15/24 1305     Visit Number 4    Number of Visits 8    Authorization Type BCBS Medicare    Authorization Time Period no auth req    PT Start Time 0100    PT Stop Time 0140    PT Time Calculation (min) 40 min    Activity Tolerance Patient tolerated treatment well    Behavior During Therapy WFL for tasks assessed/performed              Past Medical History:  Diagnosis Date   Anginal pain (HCC)    Anxiety    Anxiety disorder    With apparent panic attacks   Arthritis    "hands, knees, back" (09/18/2017)   CAD S/P percutaneous coronary angioplasty 08/2017   Single-vessel CAD involving second diagonal branch treated with DES stent Synergy 2.25 mm x 12 mm (2.4 mm)   Chronic back pain    "all over" (09/18/2017)   COPD (chronic obstructive pulmonary disease) (HCC)    PFTs were done in 2008 at [facility]   Depression    Dyspnea    Dysrhythmia    LBBB   Essential hypertension    GERD (gastroesophageal reflux disease)    Hiatal hernia    History of left bundle branch block (LBBB) 08/2017   Rate Related LBBB noted in cath lab   Hyperlipidemia    Macular degeneration    right eye   Myocardial infarction [facility]) 2007   "medically induced"   Nonocclusive coronary atherosclerosis of native coronary artery 2006 through 2012   multi caths 2012, 2006, 2007 2009 (2007 complicated by catheter-induced dissection of small nondominant RCA, patent in 2009 with no residual abnormality); Myoview August 2013: LOW RISK, normal EF. ; Echocardiogram [name] - January 2014) moderate LVH, EF 55-65%. No significant valvular disease.   OSA (obstructive sleep apnea) 9/25/ 2012   tested 2009; tetested sleep study 07/2011--titration 09/20/2011 now use Bi-PAP   OSA on CPAP    Pneumonia    "several  times in 2017; 3 times already this year" (09/18/2017)   Scoliosis    Type II diabetes mellitus (HCC)    Past Surgical History:  Procedure Laterality Date   BIOPSY  04/20/2022   Procedure: BIOPSY;  Surgeon: Malissa Hippo, MD;  [facility];  Service: Endoscopy;;   CARDIAC CATHETERIZATION  1308,6578; 2009   Non-occlusice CAD - only 80% ostial SP1;  NON DOMINANNT  RCA (catheter insuced dissection with MI in 2007 --> resolved by 2009 cath)   CATARACT EXTRACTION, BILATERAL Bilateral 2017   Toric lens "in the left eye only"   COLONOSCOPY WITH PROPOFOL N/A 11/09/2018   Procedure: COLONOSCOPY WITH PROPOFOL;  Surgeon: Malissa Hippo, MD;  [facility]: [facility];  Service: Endoscopy;  Laterality: N/A;  2:25   CORONARY ANGIOPLASTY     x 1 stent   CORONARY PRESSURE/FFR STUDY N/A 09/18/2017   Procedure: INTRAVASCULAR PRESSURE WIRE/FFR STUDY;  Surgeon: Marykay Lex, MD;  Location: [facility] INVASIVE CV LAB;  Service: Cardiovascular: FFR Diag 2 -- 0.78 (significcant) --> PCI   CORONARY STENT INTERVENTION N/A 09/18/2017   Procedure: CORONARY STENT INTERVENTION;  Surgeon: Marykay Lex, MD;  Location: [facility] INVASIVE CV LAB: DES PCI Diag2: Synergy DES 2.25 mm x 12 mm (2.4 mm)  DIAGNOSTIC LAPAROSCOPY Right    DILATION AND CURETTAGE OF UTERUS     ESOPHAGEAL DILATION N/A 04/20/2022   Procedure: ESOPHAGEAL DILATION;  Surgeon: Malissa Hippo, MD;  [facility];  Service: Endoscopy;  Laterality: N/A;   ESOPHAGOGASTRODUODENOSCOPY (EGD) WITH PROPOFOL N/A 04/20/2022   Procedure: ESOPHAGOGASTRODUODENOSCOPY (EGD) WITH PROPOFOL;  Surgeon: Malissa Hippo, MD;  [facility];  Service: Endoscopy;  Laterality: N/A;  240   EYE SURGERY     KNEE ARTHROSCOPY Right    LEFT HEART CATH AND CORONARY ANGIOGRAPHY N/A 09/18/2017   Procedure: LEFT HEART CATH AND CORONARY ANGIOGRAPHY;  Surgeon: Marykay Lex, MD;  Location: [facility] INVASIVE CV LAB: Culpril - 80% Diag2 (FFR 0.78)- PCI.  pLAD 40%,  pCx 40%. Post PCI LBBB. LVEDP - ~20 mmHg. EF 55-60%   LIPOMA EXCISION Right ~ 2015   anterior abdomen   NM MYOVIEW LTD  08/04/2021   LOW RISK. EF 55-60%. Mild fixed apical defect - /w breast attenuation.   PARS PLANA VITRECTOMY W/ REPAIR OF MACULAR HOLE Right    Unsuccessful repair.  Hole filled   POLYPECTOMY  11/09/2018   Procedure: POLYPECTOMY;  Surgeon: Malissa Hippo, MD;  [facility];  Service: Endoscopy;;  colon   POLYPECTOMY  04/20/2022   Procedure: POLYPECTOMY;  Surgeon: Malissa Hippo, MD;  [facility];  Service: Endoscopy;;  bx of polyps   TRANSTHORACIC ECHOCARDIOGRAM  08/04/2021   Normal LVEF 55-60%.  Septal movement consistent with LBBB now present.  Unable to assess diastolic function.  Normal RV size and function.  Unable assess PAP.  Normal aortic and mitral valves.  Normal RAP.   VAGINAL HYSTERECTOMY     Patient Active Problem List   Diagnosis Date Noted   Lumbar back pain with radiculopathy affecting lower extremity 10/14/2023   History of colonic polyps    Left bundle branch block (LBBB) determined by electrocardiography 07/26/2021   Esophageal dysphagia 01/20/2021   Chest wall pain 09/01/2020   Goals of care, counseling/discussion 12/31/2019   COVID-19    Acute respiratory failure with hypoxia (HCC) 12/30/2019   Chronic hypoxic respiratory failure, on home oxygen therapy (HCC)    Increased oxygen demand    Pneumonia due to COVID-19 virus 12/28/2019   Thrombocytopenia (HCC) 12/28/2019   Acute hypoxemic respiratory failure due to COVID-19 [facility]) 12/27/2019   Chest pain with moderate risk for cardiac etiology 10/04/2017   Hypotension 10/02/2017   Status post coronary artery stent placement    Pre-op evaluation 09/05/2017   Special screening for malignant neoplasms, colon 07/31/2017   Elevated CK 07/30/2017   DOE (dyspnea on exertion) 07/28/2017   Bilateral lower extremity edema 09/10/2016   Claudication (HCC) 06/18/2015   Obesity (BMI  30-39.9) 04/19/2014   Coronary artery disease involving native coronary artery of native heart with angina pectoris (HCC)    Hyperlipidemia with target low density lipoprotein (LDL) cholesterol less than 55 mg/dL    Dysphagia 51/88/4166   Hypothyroidism 04/19/2008   DIABETES MELLITUS 04/19/2008   ANXIETY DEPRESSION 04/19/2008   Essential hypertension 04/19/2008   Myocardial infarction (HCC) 04/19/2008   GERD (gastroesophageal reflux disease) 04/19/2008   ISCHEMIC COLITIS 04/19/2008   ARTHRITIS 04/19/2008    PCP: Assunta Found, MDPCP - General   REFERRING PROVIDER: Clovis Riley, PA-CRef Provider   REFERRING DIAG:  Diagnosis  M54.6 (ICD-10-CM) - Pain in thoracic spine    Rationale for Evaluation and Treatment: Rehabilitation  THERAPY DIAG:  No diagnosis found.  ONSET DATE: September 2024     SUBJECTIVE:                                                                                                                                                                                           SUBJECTIVE STATEMENT: Today: Patient reports her pain is 5/10 today.    I.E: Patient reports that 4 month ago, she was getting off an ambulance. The EMT was helping patient come off of the ambulance and "dropped" the patient, as she reports. This pain persisted for a year until patient went to [facility] September- November 2024. MD's performed MRI of lumbar and upper back, see below. Patient was prescribed medicine and was referred to [facility].    PERTINENT HISTORY:  CORONARY STENT INTERVENTION  2018 LEFT HEART CATH AND CORONARY ANGIOGRAPHY  2018 COPD( uses supplemental O2 @ home) Macular degeneration in the right eye  CPAP use at night     PAIN:  Are you having pain? Yes: NPRS scale: 6/10 Pain location: starts at upper back down to low back  Pain description: sharp, constant Aggravating factors: standing, lifting, bending  Relieving factors: n/a  PRECAUTIONS: None  RED  FLAGS: None   WEIGHT BEARING RESTRICTIONS: No  FALLS:  Has patient fallen in last 6 months? Yes. Number of falls 1  LIVING ENVIRONMENT: Lives with: lives with their spouse Lives in: House/apartment Stairs: No Has following equipment at home: Single point cane, Environmental consultant - 2 wheeled, Tour manager, and Grab bars How many hours do you sleep every night: </= 6 hours due to pain    OCCUPATION: retired  PLOF: Independent   PATIENT GOALS: To be able to do ADLs pain-free  NEXT MD VISIT: TBD    OBJECTIVE:   DIAGNOSTIC FINDINGS:  Lumbar    IMPRESSION: 1. Transitional lumbosacral anatomy. For the purposes of this dictation, L5 is partially sacralized. Correlation with radiographs is recommended prior to any operative intervention. 2. At L2-L3, moderate canal stenosis 3. At L4-L5, severe facet arthropathy with grade 1 anterolisthesis and mild canal stenosis. 4. Additional multilevel mild foraminal and canal stenosis is detailed above  Thoracic  Predominantly mild disc bulging throughout the thoracic spine. Moderate to severe facet arthrosis throughout the lower thoracic spine. Mild spinal stenosis and moderate left greater than right neural foraminal stenosis at T10-11 due to disc bulging    PATIENT SURVEYS:  Modified Oswestry Low Back Pain Disability Questionnaire: 30 / 50 = 60.0 %  SCREENING FOR RED FLAGS: Bowel or bladder incontinence: No Spinal tumors: No Cauda equina syndrome: No Compression fracture: No Abdominal aneurysm: No  COGNITION: Overall cognitive status: Within functional  limits for tasks assessed  POSTURE: decreased lumbar lordosis and increased thoracic kyphosis      FUNCTIONAL TESTS:  5 times sit to stand: 14s using moderate upper extremity push; slow & steady with pain, contact guard assist   .20-29 years: 6.0  1.4 seconds 30-39 years: 6.1  1.4 seconds 40-49 years: 7.6  1.8 seconds 50-59 years: 7.7  2.6 seconds 60-69 years: 8.4   0.0 seconds (female), 12.7  1.8 seconds (female) 70-79 years: 11.6  3.4 seconds (female), 13.0  4.8 seconds (female) 80-89 years: 16.7  4.5 seconds (female), 17.2  5.5 seconds (female) 90+ years: 19.5  2.3 seconds (female), 22.9  9.6 seconds (female)  The Minimal Clinically Important Difference (MCID) is 2.3 seconds.  A score of 16 seconds or less indicates that the participant is not likely to fall.  A score of more than 16 seconds indicates a higher risk of falls  As per American Physical Therapy Association (APTA) website   GAIT ANALYSIS: Distance walked: 25 ft Assistive device utilized: None Level of assistance: SBA Comments: Patient with moderate unsteadiness; left antalgia   SENSATION: WFL   LOWER EXTREMITY MMT:     MMT Right eval Left eval  Hip flexion 2+/5 2+/5  Hip extension    Hip abduction    Hip adduction    Hip internal rotation    Hip external rotation    Knee flexion 3+/5  3-/5  Knee extension 3+/5 3-/5  Ankle dorsiflexion    Ankle plantarflexion    Ankle inversion    Ankle eversion     (Blank rows = not tested)  PALPATION: Moderate tenderness to palpation TH/ LU paraspinals     TODAY'S TREATMENT:                                                                                                                              DATE:   01/15/24 NuStep Warm Up L2 Standing  Weighted sled push with 40# 97ft x 6laps  S2S off elevated surface + Tidal Tank 2x5    01/10/24 Seated: thoracic extension with pillow in lower back 2X5 with 5" holds  Abdominal bracing march alternating 10X  Abdominal bracing LAQ alternating 10X  W backs 10X Standing: Doorway chest stretch 3X20"  Red theraband rows 10X  Red theraband shoulder extensions 10X  01/08/24 Supine  LTRs into PT contact  Ball squeezes 10x3-5"H  Hip abduction with G theraband 2x10  Hip marches with G theraband 2x10 Seated  Sitting theraband extension with G theraband x10  Seated pallof presses with G  theraband x5 each Standing  Step to 4" step + theraband extension G theraband , PT contact guard assist   Weighted sled push with 40# 36ftx2, PT stand-by assist   01/03/24 PT I.E. Supine  LTR's into PT resistance 2x10  Ball Squeezes 10x 3-5" H  Partial hip marches x10 each Seated  Forward flexion stretch with XL Physioball 10x3"H   PATIENT  EDUCATION:  Education details: HEP Person educated: Patient Education method: Explanation Education comprehension: verbalized understanding  HOME EXERCISE PROGRAM: Access Code: [email or web address] URL: [email or web address] Date: 01/15/2024 Prepared by: Seymour Bars  Exercises - Lower Trunk Rotations  - 1-2 x daily - 20 reps - Supine March with Resistance Band  - 1-2 x daily - 20 reps - Hooklying Clamshell with Resistance  - 1-2 x daily - 20 reps - Standing Row with Resistance with Anchored Resistance at Chest Height Palms Down  - 2 x daily - 7 x weekly - 1 sets - 10 reps - Shoulder Extension with Resistance  - 2 x daily - 7 x weekly - 1 sets - 10 reps  ASSESSMENT:  CLINICAL IMPRESSION: PT progressed patient through Therapeutic Activity focusing on weighted sled pushed, sit to stands off elevated surface holding Tidal Tank to help improve squatting. Patient tolerated session well with intermittent rest breaks. Patient tolerated well with no increase in pain levels.  The patient will benefit from PT to address the limitations/impairments listed below to return to their prior level of function in the domains of activity and participation  I.E. Patient is a 78 y.o. y.o. female who was seen today for physical therapy evaluation and treatment for thoracic/lumbar pain, muscle weakness . Patient presents to PT with the following objective impairments: Abnormal gait, decreased activity tolerance, decreased endurance, decreased mobility, difficulty walking, decreased strength, increased muscle spasms, improper body mechanics, postural dysfunction,  and pain. These impairments limit the patient in activities such as carrying, lifting, bending, sitting, standing, squatting, sleeping, stairs, locomotion level, and caring for others. These impairments also limit the patient in participation such as meal prep, cleaning, laundry, interpersonal relationship, driving, shopping, and community activity. The patient will benefit from PT to address the limitations/impairments listed below to return to their prior level of function in the domains of activity and participation.    PERSONAL FACTORS: Fitness are also affecting patient's functional outcome.   REHAB POTENTIAL: Fair    CLINICAL DECISION MAKING: Stable/uncomplicated  EVALUATION COMPLEXITY: Low    GOALS: Goals reviewed with patient? No  SHORT TERM GOALS: Target date: 01/24/2024   1. Patient will be independent with a basic stretching/strengthening HEP  Baseline:  Goal status: INITIAL  2.   Patient will complete the 5 times sit to stand:  within 1 minute to demonstrate an improvement lower extremity strength needed for home and community ambulation  Baseline:  Goal status: INITIAL   LONG TERM GOALS: Target date: 02/14/2024    Patient will be able to score a </= 50% on the Modified Oswestry    to demonstrate an improvement in overall housework, ADL completion, mobility, and self-care.Baseline:  Goal status: INITIAL  2.   Patient will complete the 5 times sit to stand:  within 50 seconds  to demonstrate an improvement lower extremity strength needed for home and community ambulation  Baseline:  Goal status: INITIAL  3.  Patient will be independent with a comprehensive strengthening HEP  Baseline:  Goal status: INITIAL    PLAN:  PT FREQUENCY: 1-2x/week  PT DURATION: 6 weeks  PLANNED INTERVENTIONS: 97110-Therapeutic exercises, 97530- Therapeutic activity, 97112- Neuromuscular re-education, 97535- Self Care, [id number]- Manual therapy, [id number]- Gait training, 97014- Electrical  stimulation (unattended), [phone]- Electrical stimulation (manual), Patient/Family education, Stair training, Dry Needling, Joint mobilization, Joint manipulation, Spinal manipulation, Spinal mobilization, DME instructions, Cryotherapy, and Moist heat.  PLAN FOR NEXT SESSION: Progress with Therapeutic Activity such as bending, squatting, hinges; continue lower extremity strength  Seymour Bars, PT 01/15/2024, 1:06 PM

## 2024-01-17 ENCOUNTER — Ambulatory Visit (HOSPITAL_COMMUNITY): Payer: Medicare Other

## 2024-01-17 DIAGNOSIS — M546 Pain in thoracic spine: Secondary | ICD-10-CM | POA: Diagnosis not present

## 2024-01-17 DIAGNOSIS — M5459 Other low back pain: Secondary | ICD-10-CM

## 2024-01-17 DIAGNOSIS — R293 Abnormal posture: Secondary | ICD-10-CM | POA: Diagnosis not present

## 2024-01-17 DIAGNOSIS — M542 Cervicalgia: Secondary | ICD-10-CM | POA: Diagnosis not present

## 2024-01-17 DIAGNOSIS — M6281 Muscle weakness (generalized): Secondary | ICD-10-CM

## 2024-01-17 NOTE — Therapy (Signed)
OUTPATIENT PHYSICAL THERAPY EVALUATION (THORACOLUMBAR)   Patient Name: Vickie [name] MRN: [id number] DOB:Nov 24, 1946, 78 y.o., female Today's Date: 01/17/2024  END OF SESSION:   PT End of Session - 01/17/24 1137     Visit Number 5    Number of Visits 8    Authorization Type BCBS Medicare    Authorization Time Period no auth req    PT Start Time 1015    PT Stop Time 1055    PT Time Calculation (min) 40 min    Activity Tolerance Patient tolerated treatment well    Behavior During Therapy WFL for tasks assessed/performed              Past Medical History:  Diagnosis Date   Anginal pain (HCC)    Anxiety    Anxiety disorder    With apparent panic attacks   Arthritis    "hands, knees, back" (09/18/2017)   CAD S/P percutaneous coronary angioplasty 08/2017   Single-vessel CAD involving second diagonal branch treated with DES stent Synergy 2.25 mm x 12 mm (2.4 mm)   Chronic back pain    "all over" (09/18/2017)   COPD (chronic obstructive pulmonary disease) (HCC)    PFTs were done in 2008 at [facility]   Depression    Dyspnea    Dysrhythmia    LBBB   Essential hypertension    GERD (gastroesophageal reflux disease)    Hiatal hernia    History of left bundle branch block (LBBB) 08/2017   Rate Related LBBB noted in cath lab   Hyperlipidemia    Macular degeneration    right eye   Myocardial infarction [facility]) 2007   "medically induced"   Nonocclusive coronary atherosclerosis of native coronary artery 2006 through 2012   multi caths 2012, 2006, 2007 2009 (2007 complicated by catheter-induced dissection of small nondominant RCA, patent in 2009 with no residual abnormality); Myoview August 2013: LOW RISK, normal EF. ; Echocardiogram [name] - January 2014) moderate LVH, EF 55-65%. No significant valvular disease.   OSA (obstructive sleep apnea) 9/25/ 2012   tested 2009; tetested sleep study 07/2011--titration 09/20/2011 now use Bi-PAP   OSA on CPAP    Pneumonia    "several  times in 2017; 3 times already this year" (09/18/2017)   Scoliosis    Type II diabetes mellitus (HCC)    Past Surgical History:  Procedure Laterality Date   BIOPSY  04/20/2022   Procedure: BIOPSY;  Surgeon: Malissa Hippo, MD;  [facility];  Service: Endoscopy;;   CARDIAC CATHETERIZATION  [id number]; 2009   Non-occlusice CAD - only 80% ostial SP1;  NON DOMINANNT  RCA (catheter insuced dissection with MI in 2007 --> resolved by 2009 cath)   CATARACT EXTRACTION, BILATERAL Bilateral 2017   Toric lens "in the left eye only"   COLONOSCOPY WITH PROPOFOL N/A 11/09/2018   Procedure: COLONOSCOPY WITH PROPOFOL;  Surgeon: Malissa Hippo, MD;  [facility]: [facility];  Service: Endoscopy;  Laterality: N/A;  2:25   CORONARY ANGIOPLASTY     x 1 stent   CORONARY PRESSURE/FFR STUDY N/A 09/18/2017   Procedure: INTRAVASCULAR PRESSURE WIRE/FFR STUDY;  Surgeon: Marykay Lex, MD;  Location: [facility] INVASIVE CV LAB;  Service: Cardiovascular: FFR Diag 2 -- 0.78 (significcant) --> PCI   CORONARY STENT INTERVENTION N/A 09/18/2017   Procedure: CORONARY STENT INTERVENTION;  Surgeon: Marykay Lex, MD;  Location: [facility] INVASIVE CV LAB: DES PCI Diag2: Synergy DES 2.25 mm x 12 mm (2.4 mm)  DIAGNOSTIC LAPAROSCOPY Right    DILATION AND CURETTAGE OF UTERUS     ESOPHAGEAL DILATION N/A 04/20/2022   Procedure: ESOPHAGEAL DILATION;  Surgeon: Malissa Hippo, MD;  [facility];  Service: Endoscopy;  Laterality: N/A;   ESOPHAGOGASTRODUODENOSCOPY (EGD) WITH PROPOFOL N/A 04/20/2022   Procedure: ESOPHAGOGASTRODUODENOSCOPY (EGD) WITH PROPOFOL;  Surgeon: Malissa Hippo, MD;  [facility];  Service: Endoscopy;  Laterality: N/A;  240   EYE SURGERY     KNEE ARTHROSCOPY Right    LEFT HEART CATH AND CORONARY ANGIOGRAPHY N/A 09/18/2017   Procedure: LEFT HEART CATH AND CORONARY ANGIOGRAPHY;  Surgeon: Marykay Lex, MD;  Location: [facility] INVASIVE CV LAB: Culpril - 80% Diag2 (FFR 0.78)- PCI.  pLAD 40%,  pCx 40%. Post PCI LBBB. LVEDP - ~20 mmHg. EF 55-60%   LIPOMA EXCISION Right ~ 2015   anterior abdomen   NM MYOVIEW LTD  08/04/2021   LOW RISK. EF 55-60%. Mild fixed apical defect - /w breast attenuation.   PARS PLANA VITRECTOMY W/ REPAIR OF MACULAR HOLE Right    Unsuccessful repair.  Hole filled   POLYPECTOMY  11/09/2018   Procedure: POLYPECTOMY;  Surgeon: Malissa Hippo, MD;  [facility];  Service: Endoscopy;;  colon   POLYPECTOMY  04/20/2022   Procedure: POLYPECTOMY;  Surgeon: Malissa Hippo, MD;  [facility];  Service: Endoscopy;;  bx of polyps   TRANSTHORACIC ECHOCARDIOGRAM  08/04/2021   Normal LVEF 55-60%.  Septal movement consistent with LBBB now present.  Unable to assess diastolic function.  Normal RV size and function.  Unable assess PAP.  Normal aortic and mitral valves.  Normal RAP.   VAGINAL HYSTERECTOMY     Patient Active Problem List   Diagnosis Date Noted   Lumbar back pain with radiculopathy affecting lower extremity 10/14/2023   History of colonic polyps    Left bundle branch block (LBBB) determined by electrocardiography 07/26/2021   Esophageal dysphagia 01/20/2021   Chest wall pain 09/01/2020   Goals of care, counseling/discussion 12/31/2019   COVID-19    Acute respiratory failure with hypoxia (HCC) 12/30/2019   Chronic hypoxic respiratory failure, on home oxygen therapy (HCC)    Increased oxygen demand    Pneumonia due to COVID-19 virus 12/28/2019   Thrombocytopenia (HCC) 12/28/2019   Acute hypoxemic respiratory failure due to COVID-19 [facility]) 12/27/2019   Chest pain with moderate risk for cardiac etiology 10/04/2017   Hypotension 10/02/2017   Status post coronary artery stent placement    Pre-op evaluation 09/05/2017   Special screening for malignant neoplasms, colon 07/31/2017   Elevated CK 07/30/2017   DOE (dyspnea on exertion) 07/28/2017   Bilateral lower extremity edema 09/10/2016   Claudication (HCC) 06/18/2015   Obesity (BMI  30-39.9) 04/19/2014   Coronary artery disease involving native coronary artery of native heart with angina pectoris (HCC)    Hyperlipidemia with target low density lipoprotein (LDL) cholesterol less than 55 mg/dL    Dysphagia 62/83/1517   Hypothyroidism 04/19/2008   DIABETES MELLITUS 04/19/2008   ANXIETY DEPRESSION 04/19/2008   Essential hypertension 04/19/2008   Myocardial infarction (HCC) 04/19/2008   GERD (gastroesophageal reflux disease) 04/19/2008   ISCHEMIC COLITIS 04/19/2008   ARTHRITIS 04/19/2008    PCP: Assunta Found, MDPCP - General   REFERRING PROVIDER: Clovis Riley, PA-CRef Provider   REFERRING DIAG:  Diagnosis  M54.6 (ICD-10-CM) - Pain in thoracic spine    Rationale for Evaluation and Treatment: Rehabilitation  THERAPY DIAG:  Other low back  pain  Pain in thoracic spine  Muscle weakness (generalized)  ONSET DATE: September 2024     SUBJECTIVE:                                                                                                                                                                                           SUBJECTIVE STATEMENT: Today: Patient reports her pain is 6/10 today. States she felt good after her HEP so she decided to do work around the home and is very stiff today.   I.E: Patient reports that 4 month ago, she was getting off an ambulance. The EMT was helping patient come off of the ambulance and "dropped" the patient, as she reports. This pain persisted for a year until patient went to [facility] September- November 2024. MD's performed MRI of lumbar and upper back, see below. Patient was prescribed medicine and was referred to [facility].    PERTINENT HISTORY:  CORONARY STENT INTERVENTION  2018 LEFT HEART CATH AND CORONARY ANGIOGRAPHY  2018 COPD( uses supplemental O2 @ home) Macular degeneration in the right eye  CPAP use at night     PAIN:  Are you having pain? Yes: NPRS scale: 6/10 Pain location: starts at upper back down  to low back  Pain description: sharp, constant Aggravating factors: standing, lifting, bending  Relieving factors: n/a  PRECAUTIONS: None  RED FLAGS: None   WEIGHT BEARING RESTRICTIONS: No  FALLS:  Has patient fallen in last 6 months? Yes. Number of falls 1  LIVING ENVIRONMENT: Lives with: lives with their spouse Lives in: House/apartment Stairs: No Has following equipment at home: Single point cane, Environmental consultant - 2 wheeled, Tour manager, and Grab bars How many hours do you sleep every night: </= 6 hours due to pain    OCCUPATION: retired  PLOF: Independent   PATIENT GOALS: To be able to do ADLs pain-free  NEXT MD VISIT: TBD    OBJECTIVE:   DIAGNOSTIC FINDINGS:  Lumbar    IMPRESSION: 1. Transitional lumbosacral anatomy. For the purposes of this dictation, L5 is partially sacralized. Correlation with radiographs is recommended prior to any operative intervention. 2. At L2-L3, moderate canal stenosis 3. At L4-L5, severe facet arthropathy with grade 1 anterolisthesis and mild canal stenosis. 4. Additional multilevel mild foraminal and canal stenosis is detailed above  Thoracic  Predominantly mild disc bulging throughout the thoracic spine. Moderate to severe facet arthrosis throughout the lower thoracic spine. Mild spinal stenosis and moderate left greater than right neural foraminal stenosis at T10-11 due to disc bulging    PATIENT SURVEYS:  Modified Oswestry Low Back Pain Disability Questionnaire: 30 / 50 = 60.0 %  SCREENING FOR RED FLAGS: Bowel or bladder incontinence: No Spinal tumors: No Cauda equina syndrome: No Compression fracture: No Abdominal aneurysm: No  COGNITION: Overall cognitive status: Within functional limits for tasks assessed  POSTURE: decreased lumbar lordosis and increased thoracic kyphosis      FUNCTIONAL TESTS:  5 times sit to stand: 14s using moderate upper extremity push; slow & steady with pain, contact guard assist    .20-29 years: 6.0  1.4 seconds 30-39 years: 6.1  1.4 seconds 40-49 years: 7.6  1.8 seconds 50-59 years: 7.7  2.6 seconds 60-69 years: 8.4  0.0 seconds (female), 12.7  1.8 seconds (female) 70-79 years: 11.6  3.4 seconds (female), 13.0  4.8 seconds (female) 80-89 years: 16.7  4.5 seconds (female), 17.2  5.5 seconds (female) 90+ years: 19.5  2.3 seconds (female), 22.9  9.6 seconds (female)  The Minimal Clinically Important Difference (MCID) is 2.3 seconds.  A score of 16 seconds or less indicates that the participant is not likely to fall.  A score of more than 16 seconds indicates a higher risk of falls  As per American Physical Therapy Association (APTA) website   GAIT ANALYSIS: Distance walked: 25 ft Assistive device utilized: None Level of assistance: SBA Comments: Patient with moderate unsteadiness; left antalgia   SENSATION: WFL   LOWER EXTREMITY MMT:     MMT Right eval Left eval  Hip flexion 2+/5 2+/5  Hip extension    Hip abduction    Hip adduction    Hip internal rotation    Hip external rotation    Knee flexion 3+/5  3-/5  Knee extension 3+/5 3-/5  Ankle dorsiflexion    Ankle plantarflexion    Ankle inversion    Ankle eversion     (Blank rows = not tested)  PALPATION: Moderate tenderness to palpation TH/ LU paraspinals     TODAY'S TREATMENT:                                                                                                                              DATE:   01/17/24 Supine  DKTC with G physioball 2x10  LTR with G physioball 2x10  Hip marches with 2# 2x10  Hip bridges 2x10  Manual therapy to left hip flexor: manual stretch  Gait Training using cane, 2 pt step to  01/15/24 NuStep Warm Up L2 Standing  Weighted sled push with 40# 30ft x 6laps  S2S off elevated surface + Tidal Tank 2x5    01/10/24 Seated: thoracic extension with pillow in lower back 2X5 with 5" holds  Abdominal bracing march alternating 10X  Abdominal bracing  LAQ alternating 10X  W backs 10X Standing: Doorway chest stretch 3X20"  Red theraband rows 10X  Red theraband shoulder extensions 10X  01/08/24 Supine  LTRs into PT contact  Ball squeezes 10x3-5"H  Hip abduction with G theraband 2x10  Hip marches with G theraband 2x10 Seated  Sitting theraband extension with G theraband x10  Seated pallof presses with G theraband x5 each Standing  Step to 4" step + theraband extension G theraband , PT contact guard assist   Weighted sled push with 40# 56ftx2, PT stand-by assist   01/03/24 PT I.E. Supine  LTR's into PT resistance 2x10  Ball Squeezes 10x 3-5" H  Partial hip marches x10 each Seated  Forward flexion stretch with XL Physioball 10x3"H   PATIENT EDUCATION:  Education details: HEP Person educated: Patient Education method: Explanation Education comprehension: verbalized understanding  HOME EXERCISE PROGRAM: Access Code: [email or web address] URL: [email or web address] Date: 01/15/2024 Prepared by: Seymour Bars  Exercises - Lower Trunk Rotations  - 1-2 x daily - 20 reps - Supine March with Resistance Band  - 1-2 x daily - 20 reps - Hooklying Clamshell with Resistance  - 1-2 x daily - 20 reps - Standing Row with Resistance with Anchored Resistance at Chest Height Palms Down  - 2 x daily - 7 x weekly - 1 sets - 10 reps - Shoulder Extension with Resistance  - 2 x daily - 7 x weekly - 1 sets - 10 reps  ASSESSMENT:  CLINICAL IMPRESSION: PT progressed patient through lower level therapeutic exercise today due to pain levels. PT focused session on low load, low level lumbar mobility /stability with some manual therapy to address left hip flexor tightness; moderate tenderness to palpation to stretching.  Patient tolerated therapeutic exercise well with no increase in pain.  The patient will benefit from PT to address the limitations/impairments listed below to return to their prior level of function in the domains of activity and  participation  I.E. Patient is a 78 y.o. y.o. female who was seen today for physical therapy evaluation and treatment for thoracic/lumbar pain, muscle weakness . Patient presents to PT with the following objective impairments: Abnormal gait, decreased activity tolerance, decreased endurance, decreased mobility, difficulty walking, decreased strength, increased muscle spasms, improper body mechanics, postural dysfunction, and pain. These impairments limit the patient in activities such as carrying, lifting, bending, sitting, standing, squatting, sleeping, stairs, locomotion level, and caring for others. These impairments also limit the patient in participation such as meal prep, cleaning, laundry, interpersonal relationship, driving, shopping, and community activity. The patient will benefit from PT to address the limitations/impairments listed below to return to their prior level of function in the domains of activity and participation.    PERSONAL FACTORS: Fitness are also affecting patient's functional outcome.   REHAB POTENTIAL: Fair    CLINICAL DECISION MAKING: Stable/uncomplicated  EVALUATION COMPLEXITY: Low    GOALS: Goals reviewed with patient? No  SHORT TERM GOALS: Target date: 01/24/2024   1. Patient will be independent with a basic stretching/strengthening HEP  Baseline:  Goal status: INITIAL  2.   Patient will complete the 5 times sit to stand:  within 1 minute to demonstrate an improvement lower extremity strength needed for home and community ambulation  Baseline:  Goal status: INITIAL   LONG TERM GOALS: Target date: 02/14/2024    Patient will be able to score a </= 50% on the Modified Oswestry    to demonstrate an improvement in overall housework, ADL completion, mobility, and self-care.Baseline:  Goal status: INITIAL  2.   Patient will complete the 5 times sit to stand:  within 50 seconds  to demonstrate an improvement lower extremity strength needed for home and  community ambulation  Baseline:  Goal status: INITIAL  3.  Patient will be independent with a comprehensive strengthening HEP  Baseline:  Goal status: INITIAL  PLAN:  PT FREQUENCY: 1-2x/week  PT DURATION: 6 weeks  PLANNED INTERVENTIONS: 97110-Therapeutic exercises, 97530- Therapeutic activity, [id number]- Neuromuscular re-education, 97535- Self Care, [id number]- Manual therapy, [phone]- Gait training, [id number]- Electrical stimulation (unattended), [phone]- Electrical stimulation (manual), Patient/Family education, Stair training, Dry Needling, Joint mobilization, Joint manipulation, Spinal manipulation, Spinal mobilization, DME instructions, Cryotherapy, and Moist heat.  PLAN FOR NEXT SESSION: Progress with Therapeutic Activity such as bending, squatting, hinges; continue lower extremity strength      Seymour Bars, PT 01/17/2024, 11:38 AM

## 2024-01-22 ENCOUNTER — Ambulatory Visit (HOSPITAL_COMMUNITY): Payer: Medicare Other | Admitting: Physical Therapy

## 2024-01-22 DIAGNOSIS — M5459 Other low back pain: Secondary | ICD-10-CM | POA: Diagnosis not present

## 2024-01-22 DIAGNOSIS — M6281 Muscle weakness (generalized): Secondary | ICD-10-CM

## 2024-01-22 DIAGNOSIS — R293 Abnormal posture: Secondary | ICD-10-CM | POA: Diagnosis not present

## 2024-01-22 DIAGNOSIS — M546 Pain in thoracic spine: Secondary | ICD-10-CM

## 2024-01-22 DIAGNOSIS — M542 Cervicalgia: Secondary | ICD-10-CM | POA: Diagnosis not present

## 2024-01-22 NOTE — Therapy (Signed)
OUTPATIENT PHYSICAL THERAPY TREATMENT   Patient Name: SHARNISE BLOUGH MRN: [id number] DOB:November 22, 1946, 78 y.o., female Today's Date: 01/22/2024  END OF SESSION:   PT End of Session - 01/22/24 1112     Visit Number 6    Number of Visits 8    Date for PT Re-Evaluation 02/14/24    Authorization Type BCBS Medicare    Authorization Time Period no auth req    PT Start Time 0803    PT Stop Time 0845    PT Time Calculation (min) 42 min    Activity Tolerance Patient tolerated treatment well    Behavior During Therapy WFL for tasks assessed/performed               Past Medical History:  Diagnosis Date   Anginal pain (HCC)    Anxiety    Anxiety disorder    With apparent panic attacks   Arthritis    "hands, knees, back" (09/18/2017)   CAD S/P percutaneous coronary angioplasty 08/2017   Single-vessel CAD involving second diagonal branch treated with DES stent Synergy 2.25 mm x 12 mm (2.4 mm)   Chronic back pain    "all over" (09/18/2017)   COPD (chronic obstructive pulmonary disease) (HCC)    PFTs were done in 2008 at [facility]   Depression    Dyspnea    Dysrhythmia    LBBB   Essential hypertension    GERD (gastroesophageal reflux disease)    Hiatal hernia    History of left bundle branch block (LBBB) 08/2017   Rate Related LBBB noted in cath lab   Hyperlipidemia    Macular degeneration    right eye   Myocardial infarction [facility]) 2007   "medically induced"   Nonocclusive coronary atherosclerosis of native coronary artery 2006 through 2012   multi caths 2012, 2006, 2007 2009 (2007 complicated by catheter-induced dissection of small nondominant RCA, patent in 2009 with no residual abnormality); Myoview August 2013: LOW RISK, normal EF. ; Echocardiogram [name] - January 2014) moderate LVH, EF 55-65%. No significant valvular disease.   OSA (obstructive sleep apnea) 9/25/ 2012   tested 2009; tetested sleep study 07/2011--titration 09/20/2011 now use Bi-PAP   OSA on CPAP     Pneumonia    "several times in 2017; 3 times already this year" (09/18/2017)   Scoliosis    Type II diabetes mellitus (HCC)    Past Surgical History:  Procedure Laterality Date   BIOPSY  04/20/2022   Procedure: BIOPSY;  Surgeon: Malissa Hippo, MD;  Location: [facility];  Service: Endoscopy;;   CARDIAC CATHETERIZATION  4098,1191; 2009   Non-occlusice CAD - only 80% ostial SP1;  NON DOMINANNT  RCA (catheter insuced dissection with MI in 2007 --> resolved by 2009 cath)   CATARACT EXTRACTION, BILATERAL Bilateral 2017   Toric lens "in the left eye only"   COLONOSCOPY WITH PROPOFOL N/A 11/09/2018   Procedure: COLONOSCOPY WITH PROPOFOL;  Surgeon: Malissa Hippo, MD;  Location: [facility];  Service: Endoscopy;  Laterality: N/A;  2:25   CORONARY ANGIOPLASTY     x 1 stent   CORONARY PRESSURE/FFR STUDY N/A 09/18/2017   Procedure: INTRAVASCULAR PRESSURE WIRE/FFR STUDY;  Surgeon: Marykay Lex, MD;  Location: [facility] INVASIVE CV LAB;  Service: Cardiovascular: FFR Diag 2 -- 0.78 (significcant) --> PCI   CORONARY STENT INTERVENTION N/A 09/18/2017   Procedure: CORONARY STENT INTERVENTION;  Surgeon: Marykay Lex, MD;  Location: [facility] INVASIVE CV LAB: DES PCI Diag2: Synergy DES  2.25 mm x 12 mm (2.4 mm)   DIAGNOSTIC LAPAROSCOPY Right    DILATION AND CURETTAGE OF UTERUS     ESOPHAGEAL DILATION N/A 04/20/2022   Procedure: ESOPHAGEAL DILATION;  Surgeon: Malissa Hippo, MD;  Location: [facility];  Service: Endoscopy;  Laterality: N/A;   ESOPHAGOGASTRODUODENOSCOPY (EGD) WITH PROPOFOL N/A 04/20/2022   Procedure: ESOPHAGOGASTRODUODENOSCOPY (EGD) WITH PROPOFOL;  Surgeon: Malissa Hippo, MD;  Location: [facility];  Service: Endoscopy;  Laterality: N/A;  240   EYE SURGERY     KNEE ARTHROSCOPY Right    LEFT HEART CATH AND CORONARY ANGIOGRAPHY N/A 09/18/2017   Procedure: LEFT HEART CATH AND CORONARY ANGIOGRAPHY;  Surgeon: Marykay Lex, MD;  Location: [facility] INVASIVE CV LAB: Culpril - 80% Diag2 (FFR  0.78)- PCI.  pLAD 40%, pCx 40%. Post PCI LBBB. LVEDP - ~20 mmHg. EF 55-60%   LIPOMA EXCISION Right ~ 2015   anterior abdomen   NM MYOVIEW LTD  08/04/2021   LOW RISK. EF 55-60%. Mild fixed apical defect - /w breast attenuation.   PARS PLANA VITRECTOMY W/ REPAIR OF MACULAR HOLE Right    Unsuccessful repair.  Hole filled   POLYPECTOMY  11/09/2018   Procedure: POLYPECTOMY;  Surgeon: Malissa Hippo, MD;  Location: [facility];  Service: Endoscopy;;  colon   POLYPECTOMY  04/20/2022   Procedure: POLYPECTOMY;  Surgeon: Malissa Hippo, MD;  Location: [facility];  Service: Endoscopy;;  bx of polyps   TRANSTHORACIC ECHOCARDIOGRAM  08/04/2021   Normal LVEF 55-60%.  Septal movement consistent with LBBB now present.  Unable to assess diastolic function.  Normal RV size and function.  Unable assess PAP.  Normal aortic and mitral valves.  Normal RAP.   VAGINAL HYSTERECTOMY     Patient Active Problem List   Diagnosis Date Noted   Lumbar back pain with radiculopathy affecting lower extremity 10/14/2023   History of colonic polyps    Left bundle branch block (LBBB) determined by electrocardiography 07/26/2021   Esophageal dysphagia 01/20/2021   Chest wall pain 09/01/2020   Goals of care, counseling/discussion 12/31/2019   COVID-19    Acute respiratory failure with hypoxia (HCC) 12/30/2019   Chronic hypoxic respiratory failure, on home oxygen therapy (HCC)    Increased oxygen demand    Pneumonia due to COVID-19 virus 12/28/2019   Thrombocytopenia (HCC) 12/28/2019   Acute hypoxemic respiratory failure due to COVID-19 [facility]) 12/27/2019   Chest pain with moderate risk for cardiac etiology 10/04/2017   Hypotension 10/02/2017   Status post coronary artery stent placement    Pre-op evaluation 09/05/2017   Special screening for malignant neoplasms, colon 07/31/2017   Elevated CK 07/30/2017   DOE (dyspnea on exertion) 07/28/2017   Bilateral lower extremity edema 09/10/2016   Claudication (HCC)  06/18/2015   Obesity (BMI 30-39.9) 04/19/2014   Coronary artery disease involving native coronary artery of native heart with angina pectoris (HCC)    Hyperlipidemia with target low density lipoprotein (LDL) cholesterol less than 55 mg/dL    Dysphagia 29/52/8413   Hypothyroidism 04/19/2008   DIABETES MELLITUS 04/19/2008   ANXIETY DEPRESSION 04/19/2008   Essential hypertension 04/19/2008   Myocardial infarction (HCC) 04/19/2008   GERD (gastroesophageal reflux disease) 04/19/2008   ISCHEMIC COLITIS 04/19/2008   ARTHRITIS 04/19/2008    PCP: Assunta Found, MDPCP - General   REFERRING PROVIDER: Clovis Riley, PA-CRef Provider   REFERRING DIAG:  Diagnosis  M54.6 (ICD-10-CM) - Pain in thoracic spine    Rationale for Evaluation and  Treatment: Rehabilitation  THERAPY DIAG:  Other low back pain  Pain in thoracic spine  Muscle weakness (generalized)  ONSET DATE: September 2024     SUBJECTIVE:                                                                                                                                                                                           SUBJECTIVE STATEMENT: Today: Patient states she washed 3 loads of clothes on Saturday and will put up the last load today.  Currently pain is 4/10, however is less in the morning and increases as the day goes on.  States she didn't take her meloxicam this morning.   States they do a lot of driving (sitting) on Sundays for church/picking up folk, eating and running errands after.  States she sits in the car for most of it approx 3 hours and had extreme pain yesterday following this.   I.E: Patient reports that 4 month ago, she was getting off an ambulance. The EMT was helping patient come off of the ambulance and "dropped" the patient, as she reports. This pain persisted for a year until patient went to [facility] September- November 2024. MD's performed MRI of lumbar and upper back, see below. Patient was  prescribed medicine and was referred to [facility].    PERTINENT HISTORY:  CORONARY STENT INTERVENTION  2018 LEFT HEART CATH AND CORONARY ANGIOGRAPHY  2018 COPD( uses supplemental O2 @ home) Macular degeneration in the right eye  CPAP use at night     PAIN:  Are you having pain? Yes: NPRS scale: 6/10 Pain location: starts at upper back down to low back  Pain description: sharp, constant Aggravating factors: standing, lifting, bending  Relieving factors: n/a  PRECAUTIONS: None  RED FLAGS: None   WEIGHT BEARING RESTRICTIONS: No  FALLS:  Has patient fallen in last 6 months? Yes. Number of falls 1  LIVING ENVIRONMENT: Lives with: lives with their spouse Lives in: House/apartment Stairs: No Has following equipment at home: Single point cane, Environmental consultant - 2 wheeled, Tour manager, and Grab bars How many hours do you sleep every night: </= 6 hours due to pain    OCCUPATION: retired  PLOF: Independent   PATIENT GOALS: To be able to do ADLs pain-free  NEXT MD VISIT: TBD    OBJECTIVE:   DIAGNOSTIC FINDINGS:  Lumbar    IMPRESSION: 1. Transitional lumbosacral anatomy. For the purposes of this dictation, L5 is partially sacralized. Correlation with radiographs is recommended prior to any operative intervention. 2. At L2-L3, moderate canal stenosis 3. At L4-L5, severe facet arthropathy with grade 1 anterolisthesis and mild canal stenosis.  4. Additional multilevel mild foraminal and canal stenosis is detailed above  Thoracic  Predominantly mild disc bulging throughout the thoracic spine. Moderate to severe facet arthrosis throughout the lower thoracic spine. Mild spinal stenosis and moderate left greater than right neural foraminal stenosis at T10-11 due to disc bulging    PATIENT SURVEYS:  Modified Oswestry Low Back Pain Disability Questionnaire: 30 / 50 = 60.0 %  SCREENING FOR RED FLAGS: Bowel or bladder incontinence: No Spinal tumors: No Cauda equina syndrome:  No Compression fracture: No Abdominal aneurysm: No  COGNITION: Overall cognitive status: Within functional limits for tasks assessed  POSTURE: decreased lumbar lordosis and increased thoracic kyphosis      FUNCTIONAL TESTS:  5 times sit to stand: 14s using moderate upper extremity push; slow & steady with pain, contact guard assist   .20-29 years: 6.0  1.4 seconds 30-39 years: 6.1  1.4 seconds 40-49 years: 7.6  1.8 seconds 50-59 years: 7.7  2.6 seconds 60-69 years: 8.4  0.0 seconds (female), 12.7  1.8 seconds (female) 70-79 years: 11.6  3.4 seconds (female), 13.0  4.8 seconds (female) 80-89 years: 16.7  4.5 seconds (female), 17.2  5.5 seconds (female) 90+ years: 19.5  2.3 seconds (female), 22.9  9.6 seconds (female)  The Minimal Clinically Important Difference (MCID) is 2.3 seconds.  A score of 16 seconds or less indicates that the participant is not likely to fall.  A score of more than 16 seconds indicates a higher risk of falls  As per American Physical Therapy Association (APTA) website   GAIT ANALYSIS: Distance walked: 25 ft Assistive device utilized: None Level of assistance: SBA Comments: Patient with moderate unsteadiness; left antalgia   SENSATION: WFL   LOWER EXTREMITY MMT:     MMT Right eval Left eval  Hip flexion 2+/5 2+/5  Hip extension    Hip abduction    Hip adduction    Hip internal rotation    Hip external rotation    Knee flexion 3+/5  3-/5  Knee extension 3+/5 3-/5  Ankle dorsiflexion    Ankle plantarflexion    Ankle inversion    Ankle eversion     (Blank rows = not tested)  PALPATION: Moderate tenderness to palpation TH/ LU paraspinals     TODAY'S TREATMENT:                                                                                                                              DATE:  01/22/24 Seated: abdominal iso with marching alternating 10X each  Abdominal iso with LAQ alternating 10X  WBacks 10X3" holds Supine:  Bridge 2X10  SLR 2X10  March with abd bracing 10X   01/17/24 Supine  DKTC with G physioball 2x10  LTR with G physioball 2x10  Hip marches with 2# 2x10  Hip bridges 2x10  Manual therapy to left hip flexor: manual stretch  Gait Training using cane, 2 pt step to  01/15/24 NuStep Warm Up  L2 Standing  Weighted sled push with 40# 64ft x 6laps  S2S off elevated surface + Tidal Tank 2x5    01/10/24 Seated: thoracic extension with pillow in lower back 2X5 with 5" holds  Abdominal bracing march alternating 10X  Abdominal bracing LAQ alternating 10X  W backs 10X Standing: Doorway chest stretch 3X20"  Red theraband rows 10X  Red theraband shoulder extensions 10X  01/08/24 Supine  LTRs into PT contact  Ball squeezes 10x3-5"H  Hip abduction with G theraband 2x10  Hip marches with G theraband 2x10 Seated  Sitting theraband extension with G theraband x10  Seated pallof presses with G theraband x5 each Standing  Step to 4" step + theraband extension G theraband , PT contact guard assist   Weighted sled push with 40# 26ftx2, PT stand-by assist   01/03/24 PT I.E. Supine  LTR's into PT resistance 2x10  Ball Squeezes 10x 3-5" H  Partial hip marches x10 each Seated  Forward flexion stretch with XL Physioball 10x3"H   PATIENT EDUCATION:  Education details: HEP Person educated: Patient Education method: Explanation Education comprehension: verbalized understanding  HOME EXERCISE PROGRAM: Access Code: [email or web address] URL: [email or web address] Date: 01/15/2024 Prepared by: Seymour Bars  Exercises - Lower Trunk Rotations  - 1-2 x daily - 20 reps - Supine March with Resistance Band  - 1-2 x daily - 20 reps - Hooklying Clamshell with Resistance  - 1-2 x daily - 20 reps - Standing Row with Resistance with Anchored Resistance at Chest Height Palms Down  - 2 x daily - 7 x weekly - 1 sets - 10 reps - Shoulder Extension with Resistance  - 2 x daily - 7 x weekly - 1 sets - 10  reps  ASSESSMENT:  CLINICAL IMPRESSION: Educated pt that sitting is worst for your back, especially prolonged.  Suggested she get out of car/get up and stretch every 30 minutes or so to help prevent stiffness/soreness. Treatment focused session on low load, low level lumbar mobility /stability with good tolerance and no complaints of pain Pt reported overall improvement at end of session today.  I.E. Patient is a 78 y.o. y.o. female who was seen today for physical therapy evaluation and treatment for thoracic/lumbar pain, muscle weakness . Patient presents to PT with the following objective impairments: Abnormal gait, decreased activity tolerance, decreased endurance, decreased mobility, difficulty walking, decreased strength, increased muscle spasms, improper body mechanics, postural dysfunction, and pain. These impairments limit the patient in activities such as carrying, lifting, bending, sitting, standing, squatting, sleeping, stairs, locomotion level, and caring for others. These impairments also limit the patient in participation such as meal prep, cleaning, laundry, interpersonal relationship, driving, shopping, and community activity. The patient will benefit from PT to address the limitations/impairments listed below to return to their prior level of function in the domains of activity and participation.    PERSONAL FACTORS: Fitness are also affecting patient's functional outcome.   REHAB POTENTIAL: Fair    CLINICAL DECISION MAKING: Stable/uncomplicated  EVALUATION COMPLEXITY: Low    GOALS: Goals reviewed with patient? No  SHORT TERM GOALS: Target date: 01/24/2024   1. Patient will be independent with a basic stretching/strengthening HEP  Baseline:  Goal status: INITIAL  2.   Patient will complete the 5 times sit to stand:  within 1 minute to demonstrate an improvement lower extremity strength needed for home and community ambulation  Baseline:  Goal status: INITIAL   LONG  TERM GOALS: Target date: 02/14/2024    Patient will be able  to score a </= 50% on the Modified Oswestry    to demonstrate an improvement in overall housework, ADL completion, mobility, and self-care.Baseline:  Goal status: INITIAL  2.   Patient will complete the 5 times sit to stand:  within 50 seconds  to demonstrate an improvement lower extremity strength needed for home and community ambulation  Baseline:  Goal status: INITIAL  3.  Patient will be independent with a comprehensive strengthening HEP  Baseline:  Goal status: INITIAL    PLAN:  PT FREQUENCY: 1-2x/week  PT DURATION: 6 weeks  PLANNED INTERVENTIONS: 97110-Therapeutic exercises, 97530- Therapeutic activity, 97112- Neuromuscular re-education, 97535- Self Care, 16109- Manual therapy, [id number]- Gait training, 97014- Electrical stimulation (unattended), [phone]- Electrical stimulation (manual), Patient/Family education, Stair training, Dry Needling, Joint mobilization, Joint manipulation, Spinal manipulation, Spinal mobilization, DME instructions, Cryotherapy, and Moist heat.  PLAN FOR NEXT SESSION: Progress with Therapeutic Activity such as bending, squatting, hinges; continue lower extremity strength      Emeline Gins B, PTA 01/22/2024, 11:13 AM

## 2024-01-23 ENCOUNTER — Encounter (HOSPITAL_COMMUNITY): Payer: Medicare Other | Admitting: Physical Therapy

## 2024-01-24 ENCOUNTER — Ambulatory Visit (HOSPITAL_COMMUNITY): Payer: Medicare Other

## 2024-01-24 DIAGNOSIS — M542 Cervicalgia: Secondary | ICD-10-CM | POA: Diagnosis not present

## 2024-01-24 DIAGNOSIS — M6281 Muscle weakness (generalized): Secondary | ICD-10-CM

## 2024-01-24 DIAGNOSIS — R293 Abnormal posture: Secondary | ICD-10-CM | POA: Diagnosis not present

## 2024-01-24 DIAGNOSIS — M546 Pain in thoracic spine: Secondary | ICD-10-CM

## 2024-01-24 DIAGNOSIS — M5459 Other low back pain: Secondary | ICD-10-CM | POA: Diagnosis not present
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
## 2024-01-26 DIAGNOSIS — Z6836 Body mass index (BMI) 36.0-36.9, adult: Secondary | ICD-10-CM | POA: Diagnosis not present

## 2024-01-26 DIAGNOSIS — R7303 Prediabetes: Secondary | ICD-10-CM | POA: Diagnosis not present

## 2024-01-26 DIAGNOSIS — E6609 Other obesity due to excess calories: Secondary | ICD-10-CM | POA: Diagnosis not present

## 2024-01-26 DIAGNOSIS — E785 Hyperlipidemia, unspecified: Secondary | ICD-10-CM | POA: Diagnosis not present

## 2024-01-26 DIAGNOSIS — I10 Essential (primary) hypertension: Secondary | ICD-10-CM | POA: Diagnosis not present

## 2024-01-26 DIAGNOSIS — E1159 Type 2 diabetes mellitus with other circulatory complications: Secondary | ICD-10-CM | POA: Diagnosis not present

## 2024-01-29 ENCOUNTER — Ambulatory Visit (HOSPITAL_COMMUNITY): Payer: Medicare Other | Attending: Surgery | Admitting: Physical Therapy

## 2024-01-29 DIAGNOSIS — M542 Cervicalgia: Secondary | ICD-10-CM | POA: Insufficient documentation

## 2024-01-29 DIAGNOSIS — R293 Abnormal posture: Secondary | ICD-10-CM | POA: Diagnosis not present

## 2024-01-29 DIAGNOSIS — M546 Pain in thoracic spine: Secondary | ICD-10-CM | POA: Diagnosis not present

## 2024-01-29 DIAGNOSIS — M6281 Muscle weakness (generalized): Secondary | ICD-10-CM | POA: Diagnosis not present

## 2024-01-29 DIAGNOSIS — M5459 Other low back pain: Secondary | ICD-10-CM | POA: Insufficient documentation
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
## 2024-01-30 ENCOUNTER — Encounter (HOSPITAL_COMMUNITY): Payer: Medicare Other

## 2024-01-31 ENCOUNTER — Ambulatory Visit (HOSPITAL_COMMUNITY): Payer: Medicare Other

## 2024-01-31 DIAGNOSIS — M5459 Other low back pain: Secondary | ICD-10-CM

## 2024-01-31 DIAGNOSIS — M546 Pain in thoracic spine: Secondary | ICD-10-CM | POA: Diagnosis not present

## 2024-01-31 DIAGNOSIS — R293 Abnormal posture: Secondary | ICD-10-CM | POA: Diagnosis not present

## 2024-01-31 DIAGNOSIS — M6281 Muscle weakness (generalized): Secondary | ICD-10-CM

## 2024-01-31 DIAGNOSIS — M542 Cervicalgia: Secondary | ICD-10-CM | POA: Diagnosis not present

## 2024-01-31 NOTE — Therapy (Signed)
 OUTPATIENT PHYSICAL THERAPY TREATMENT   Patient Name: Vickie [name] MRN: [id number] DOB:Jan 15, 1946, 78 y.o., female Today's Date: 01/31/2024  END OF SESSION:   PT End of Session - 01/31/24 0948     Visit Number 9    Number of Visits 12    Date for PT Re-Evaluation 02/14/24    Authorization Type BCBS Medicare    Authorization Time Period no auth req    PT Start Time 0930    PT Stop Time 1010    PT Time Calculation (min) 40 min    Activity Tolerance Patient tolerated treatment well    Behavior During Therapy WFL for tasks assessed/performed            Past Medical History:  Diagnosis Date   Anginal pain (HCC)    Anxiety    Anxiety disorder    With apparent panic attacks   Arthritis    hands, knees, back (09/18/2017)   CAD S/P percutaneous coronary angioplasty 08/2017   Single-vessel CAD involving second diagonal branch treated with DES stent Synergy 2.25 mm x 12 mm (2.4 mm)   Chronic back pain    all over (09/18/2017)   COPD (chronic obstructive pulmonary disease) (HCC)    PFTs were done in 2008 at [facility]   Depression    Dyspnea    Dysrhythmia    LBBB   Essential hypertension    GERD (gastroesophageal reflux disease)    Hiatal hernia    History of left bundle branch block (LBBB) 08/2017   Rate Related LBBB noted in cath lab   Hyperlipidemia    Macular degeneration    right eye   Myocardial infarction [facility]) 2007   medically induced   Nonocclusive coronary atherosclerosis of native coronary artery 2006 through 2012   multi caths 2012, 2006, 2007 2009 (2007 complicated by catheter-induced dissection of small nondominant RCA, patent in 2009 with no residual abnormality); Myoview  August 2013: LOW RISK, normal EF. ; Echocardiogram [facility] - January 2014) moderate LVH, EF 55-65%. No significant valvular disease.   OSA (obstructive sleep apnea) 9/25/ 2012   tested 2009; tetested sleep study 07/2011--titration 09/20/2011 now use Bi-PAP   OSA on CPAP     Pneumonia    several times in 2017; 3 times already this year (09/18/2017)   Scoliosis    Type II diabetes mellitus (HCC)    Past Surgical History:  Procedure Laterality Date   BIOPSY  04/20/2022   Procedure: BIOPSY;  Surgeon: Golda Claudis PENNER, MD;  Location: [facility];  Service: Endoscopy;;   CARDIAC CATHETERIZATION  [id number]; 2009   Non-occlusice CAD - only 80% ostial SP1;  NON DOMINANNT  RCA (catheter insuced dissection with MI in 2007 --> resolved by 2009 cath)   CATARACT EXTRACTION, BILATERAL Bilateral 2017   Toric lens in the left eye only   COLONOSCOPY WITH PROPOFOL  N/A 11/09/2018   Procedure: COLONOSCOPY WITH PROPOFOL ;  Surgeon: Golda Claudis PENNER, MD;  [facility]: [facility];  Service: Endoscopy;  Laterality: N/A;  2:25   CORONARY ANGIOPLASTY     x 1 stent   CORONARY PRESSURE/FFR STUDY N/A 09/18/2017   Procedure: INTRAVASCULAR PRESSURE WIRE/FFR STUDY;  Surgeon: Anner Alm ORN, MD;  Location: [facility] INVASIVE CV LAB;  Service: Cardiovascular: FFR Diag 2 -- 0.78 (significcant) --> PCI   CORONARY STENT INTERVENTION N/A 09/18/2017   Procedure: CORONARY STENT INTERVENTION;  Surgeon: Anner Alm ORN, MD;  Location: [facility] INVASIVE CV LAB: DES PCI Diag2: Synergy DES 2.25 mm x  12 mm (2.4 mm)   DIAGNOSTIC LAPAROSCOPY Right    DILATION AND CURETTAGE OF UTERUS     ESOPHAGEAL DILATION N/A 04/20/2022   Procedure: ESOPHAGEAL DILATION;  Surgeon: Golda Claudis PENNER, MD;  [facility]: [facility];  Service: Endoscopy;  Laterality: N/A;   ESOPHAGOGASTRODUODENOSCOPY (EGD) WITH PROPOFOL  N/A 04/20/2022   Procedure: ESOPHAGOGASTRODUODENOSCOPY (EGD) WITH PROPOFOL ;  Surgeon: Golda Claudis PENNER, MD;  Location: [facility];  Service: Endoscopy;  Laterality: N/A;  240   EYE SURGERY     KNEE ARTHROSCOPY Right    LEFT HEART CATH AND CORONARY ANGIOGRAPHY N/A 09/18/2017   Procedure: LEFT HEART CATH AND CORONARY ANGIOGRAPHY;  Surgeon: Anner Alm ORN, MD;  Location: [facility] INVASIVE CV LAB: Culpril - 80% Diag2 (FFR  0.78)- PCI.  pLAD 40%, pCx 40%. Post PCI LBBB. LVEDP - ~20 mmHg. EF 55-60%   LIPOMA EXCISION Right ~ 2015   anterior abdomen   NM MYOVIEW  LTD  08/04/2021   LOW RISK. EF 55-60%. Mild fixed apical defect - /w breast attenuation.   PARS PLANA VITRECTOMY W/ REPAIR OF MACULAR HOLE Right    Unsuccessful repair.  Hole filled   POLYPECTOMY  11/09/2018   Procedure: POLYPECTOMY;  Surgeon: Golda Claudis PENNER, MD;  [facility];  Service: Endoscopy;;  colon   POLYPECTOMY  04/20/2022   Procedure: POLYPECTOMY;  Surgeon: Golda Claudis PENNER, MD;  [facility];  Service: Endoscopy;;  bx of polyps   TRANSTHORACIC ECHOCARDIOGRAM  08/04/2021   Normal LVEF 55-60%.  Septal movement consistent with LBBB now present.  Unable to assess diastolic function.  Normal RV size and function.  Unable assess PAP.  Normal aortic and mitral valves.  Normal RAP.   VAGINAL HYSTERECTOMY     Patient Active Problem List   Diagnosis Date Noted   Lumbar back pain with radiculopathy affecting lower extremity 10/14/2023   History of colonic polyps    Left bundle branch block (LBBB) determined by electrocardiography 07/26/2021   Esophageal dysphagia 01/20/2021   Chest wall pain 09/01/2020   Goals of care, counseling/discussion 12/31/2019   COVID-19    Acute respiratory failure with hypoxia (HCC) 12/30/2019   Chronic hypoxic respiratory failure, on home oxygen  therapy (HCC)    Increased oxygen  demand    Pneumonia due to COVID-19 virus 12/28/2019   Thrombocytopenia (HCC) 12/28/2019   Acute hypoxemic respiratory failure due to COVID-19 [facility]) 12/27/2019   Chest pain with moderate risk for cardiac etiology 10/04/2017   Hypotension 10/02/2017   Status post coronary artery stent placement    Pre-op evaluation 09/05/2017   Special screening for malignant neoplasms, colon 07/31/2017   Elevated CK 07/30/2017   DOE (dyspnea on exertion) 07/28/2017   Bilateral lower extremity edema 09/10/2016   Claudication (HCC)  06/18/2015   Obesity (BMI 30-39.9) 04/19/2014   Coronary artery disease involving native coronary artery of native heart with angina pectoris (HCC)    Hyperlipidemia with target low density lipoprotein (LDL) cholesterol less than 55 mg/dL    Dysphagia 97/76/7984   Hypothyroidism 04/19/2008   DIABETES MELLITUS 04/19/2008   ANXIETY DEPRESSION 04/19/2008   Essential hypertension 04/19/2008   Myocardial infarction (HCC) 04/19/2008   GERD (gastroesophageal reflux disease) 04/19/2008   ISCHEMIC COLITIS 04/19/2008   ARTHRITIS 04/19/2008    PCP: Marvine Rush, MDPCP - General   REFERRING PROVIDER: Johnanna Camie Mates, PA-CRef Provider   REFERRING DIAG:  Diagnosis  M54.6 (ICD-10-CM) - Pain in thoracic spine    Rationale for Evaluation and Treatment: Rehabilitation  THERAPY DIAG:  Other low back pain  Pain in thoracic spine  Muscle weakness (generalized)  Abnormal posture  ONSET DATE: September 2024     SUBJECTIVE:                                                                                                                                                                                           SUBJECTIVE STATEMENT:  Today: Patient currently reports of pain on the midback = 4/10   I.E: Patient reports that 4 month ago, she was getting off an ambulance. The EMT was helping patient come off of the ambulance and dropped the patient, as she reports. This pain persisted for a year until patient went to [facility] September- November 2024. MD's performed MRI of lumbar and upper back, see below. Patient was prescribed medicine and was referred to [facility].    PERTINENT HISTORY:  CORONARY STENT INTERVENTION  2018 LEFT HEART CATH AND CORONARY ANGIOGRAPHY  2018 COPD( uses supplemental O2 @ home) Macular degeneration in the right eye  CPAP use at night     PAIN:  Are you having pain? Yes: NPRS scale: 6/10 Pain location: starts at upper back down to low back  Pain description:  sharp, constant Aggravating factors: standing, lifting, bending  Relieving factors: n/a  PRECAUTIONS: None  RED FLAGS: None   WEIGHT BEARING RESTRICTIONS: No  FALLS:  Has patient fallen in last 6 months? Yes. Number of falls 1  LIVING ENVIRONMENT: Lives with: lives with their spouse Lives in: House/apartment Stairs: No Has following equipment at home: Single point cane, Environmental Consultant - 2 wheeled, Tour manager, and Grab bars How many hours do you sleep every night: </= 6 hours due to pain    OCCUPATION: retired  PLOF: Independent   PATIENT GOALS: To be able to do ADLs pain-free  NEXT MD VISIT: TBD    OBJECTIVE:   DIAGNOSTIC FINDINGS:  Lumbar    IMPRESSION: 1. Transitional lumbosacral anatomy. For the purposes of this dictation, L5 is partially sacralized. Correlation with radiographs is recommended prior to any operative intervention. 2. At L2-L3, moderate canal stenosis 3. At L4-L5, severe facet arthropathy with grade 1 anterolisthesis and mild canal stenosis. 4. Additional multilevel mild foraminal and canal stenosis is detailed above  Thoracic  Predominantly mild disc bulging throughout the thoracic spine. Moderate to severe facet arthrosis throughout the lower thoracic spine. Mild spinal stenosis and moderate left greater than right neural foraminal stenosis at T10-11 due to disc bulging    PATIENT SURVEYS:  Modified Oswestry Low Back Pain Disability Questionnaire: 30 / 50 = 60.0 %  SCREENING FOR RED FLAGS: Bowel or  bladder incontinence: No Spinal tumors: No Cauda equina syndrome: No Compression fracture: No Abdominal aneurysm: No  COGNITION: Overall cognitive status: Within functional limits for tasks assessed  POSTURE: decreased lumbar lordosis and increased thoracic kyphosis      FUNCTIONAL TESTS:  5 times sit to stand: 14s using moderate upper extremity push; slow & steady with pain, contact guard assist   .20-29 years: 6.0  1.4  seconds 30-39 years: 6.1  1.4 seconds 40-49 years: 7.6  1.8 seconds 50-59 years: 7.7  2.6 seconds 60-69 years: 8.4  0.0 seconds (female), 12.7  1.8 seconds (female) 70-79 years: 11.6  3.4 seconds (female), 13.0  4.8 seconds (female) 80-89 years: 16.7  4.5 seconds (female), 17.2  5.5 seconds (female) 90+ years: 19.5  2.3 seconds (female), 22.9  9.6 seconds (female)  The Minimal Clinically Important Difference (MCID) is 2.3 seconds.  A score of 16 seconds or less indicates that the participant is not likely to fall.  A score of more than 16 seconds indicates a higher risk of falls  As per American Physical Therapy Association (APTA) website   GAIT ANALYSIS: Distance walked: 25 ft Assistive device utilized: None Level of assistance: SBA Comments: Patient with moderate unsteadiness; left antalgia   SENSATION: WFL   LOWER EXTREMITY MMT:     MMT Right eval Left eval  Hip flexion 2+/5 2+/5  Hip extension    Hip abduction    Hip adduction    Hip internal rotation    Hip external rotation    Knee flexion 3+/5  3-/5  Knee extension 3+/5 3-/5  Ankle dorsiflexion    Ankle plantarflexion    Ankle inversion    Ankle eversion     (Blank rows = not tested)  PALPATION: Moderate tenderness to palpation TH/ LU paraspinals     TODAY'S TREATMENT:                                                                                                                              DATE:  01/31/24 Recumbent bike, seat 4, level 1, 5' Seated hamstring stretch x 30 x 3 Seated abdominal isometrics with physioball, neutral spine x 3 x 10 x 2 Seated marches, neutral spine  x 10 x 2 Seated alt knee ext, neutral spine x 10 x 2 Standing hip ext x 10  01/29/24 Nustep level 4 6 minutes LE's only seat 6 Seated: LAQ Lt 2# 2X10  W backs 2X10 sitting edge of mat in good posture  Sit to stands 10X no UE assist  01/24/24  Supine   Manual therapy to left knee: PROM to left knee, gr 2-3 AP knee  mobilizations for pain management, PROM   SAQs with 2# 2x10  DKTC with G physioball x10   LAQs with 2#  NuStep L2 Cool-down  01/22/24 Seated: abdominal iso with marching alternating 10X each  Abdominal iso with LAQ alternating 10X  WBacks 10X3 holds Supine: Bridge 2X10  SLR 2X10  March with abd bracing 10X   01/17/24 Supine  DKTC with G physioball 2x10  LTR with G physioball 2x10  Hip marches with 2# 2x10  Hip bridges 2x10  Manual therapy to left hip flexor: manual stretch  Gait Training using cane, 2 pt step to  01/15/24 NuStep Warm Up L2 Standing  Weighted sled push with 40# 49ft x 6laps  S2S off elevated surface + Tidal Tank 2x5    01/10/24 Seated: thoracic extension with pillow in lower back 2X5 with 5 holds  Abdominal bracing march alternating 10X  Abdominal bracing LAQ alternating 10X  W backs 10X Standing: Doorway chest stretch 3X20  Red theraband rows 10X  Red theraband shoulder extensions 10X  01/08/24 Supine  LTRs into PT contact  Ball squeezes 10x3-5H  Hip abduction with G theraband 2x10  Hip marches with G theraband 2x10 Seated  Sitting theraband extension with G theraband x10  Seated pallof presses with G theraband x5 each Standing  Step to 4 step + theraband extension G theraband , PT contact guard assist   Weighted sled push with 40# 72ftx2, PT stand-by assist   01/03/24 PT I.E. Supine  LTR's into PT resistance 2x10  Ball Squeezes 10x 3-5 H  Partial hip marches x10 each Seated  Forward flexion stretch with XL Physioball 10x3H   PATIENT EDUCATION:  Education details: HEP Person educated: Patient Education method: Explanation Education comprehension: verbalized understanding  HOME EXERCISE PROGRAM: Access Code: [email or web address] URL: [email or web address] 01/31/2024 - Standing Hip Extension with Counter Support  - 2 x daily - 7 x weekly - 10 reps  Date: 01/15/2024 Prepared by: Deward Ming Exercises - Lower Trunk  Rotations  - 1-2 x daily - 20 reps - Supine March with Resistance Band  - 1-2 x daily - 20 reps - Hooklying Clamshell with Resistance  - 1-2 x daily - 20 reps - Standing Row with Resistance with Anchored Resistance at Chest Height Palms Down  - 2 x daily - 7 x weekly - 1 sets - 10 reps - Shoulder Extension with Resistance  - 2 x daily - 7 x weekly - 1 sets - 10 reps  ASSESSMENT:  CLINICAL IMPRESSION: Today: Interventions today were geared towards LE flexibility and core strengthening. Tolerated all activities without worsening of symptoms. Demonstrated mild levels of fatigue. Rest periods provided. Provided slight amount of cueing to ensure correct execution of activity with good  carry-over. To date, skilled PT is required to address the impairments and improve function.   I.E. Patient is a 78 y.o. y.o. female who was seen today for physical therapy evaluation and treatment for thoracic/lumbar pain, muscle weakness . Patient presents to PT with the following objective impairments: Abnormal gait, decreased activity tolerance, decreased endurance, decreased mobility, difficulty walking, decreased strength, increased muscle spasms, improper body mechanics, postural dysfunction, and pain. These impairments limit the patient in activities such as carrying, lifting, bending, sitting, standing, squatting, sleeping, stairs, locomotion level, and caring for others. These impairments also limit the patient in participation such as meal prep, cleaning, laundry, interpersonal relationship, driving, shopping, and community activity. The patient will benefit from PT to address the limitations/impairments listed below to return to their prior level of function in the domains of activity and participation.    PERSONAL FACTORS: Fitness are also affecting patient's functional outcome.   REHAB POTENTIAL: Fair    CLINICAL DECISION MAKING: Stable/uncomplicated  EVALUATION COMPLEXITY: Low    GOALS: Goals  reviewed with patient? No  SHORT  TERM GOALS: Target date: 01/24/2024   1. Patient will be independent with a basic stretching/strengthening HEP  Baseline:  Goal status: INITIAL  2.   Patient will complete the 5 times sit to stand:  within 1 minute to demonstrate an improvement lower extremity strength needed for home and community ambulation  Baseline:  Goal status: INITIAL   LONG TERM GOALS: Target date: 02/14/2024    Patient will be able to score a </= 50% on the Modified Oswestry    to demonstrate an improvement in overall housework, ADL completion, mobility, and self-care.Baseline:  Goal status: INITIAL  2.   Patient will complete the 5 times sit to stand:  within 50 seconds  to demonstrate an improvement lower extremity strength needed for home and community ambulation  Baseline:  Goal status: INITIAL  3.  Patient will be independent with a comprehensive strengthening HEP  Baseline:  Goal status: INITIAL   PLAN:  PT FREQUENCY: 1-2x/week  PT DURATION: 6 weeks  PLANNED INTERVENTIONS: 97110-Therapeutic exercises, 97530- Therapeutic activity, 97112- Neuromuscular re-education, 97535- Self Care, [id number]- Manual therapy, [id number]- Gait training, [id number]- Electrical stimulation (unattended), [phone]- Electrical stimulation (manual), Patient/Family education, Stair training, Dry Needling, Joint mobilization, Joint manipulation, Spinal manipulation, Spinal mobilization, DME instructions, Cryotherapy, and Moist heat.  PLAN FOR NEXT SESSION: Progress postural and LE strengthening as well as therapeutic activity.  Next session add lunges and hip hinges. Discuss body mechanics for household chores.       Vinie CROME. Elzada Pytel, PT, DPT, OCS Board-Certified Clinical Specialist in Orthopedic PT PT Compact Privilege # ([place]): [id number] T 01/31/2024, 10:21 AM

## 2024-02-06 DIAGNOSIS — E1151 Type 2 diabetes mellitus with diabetic peripheral angiopathy without gangrene: Secondary | ICD-10-CM | POA: Diagnosis not present

## 2024-02-06 DIAGNOSIS — E1140 Type 2 diabetes mellitus with diabetic neuropathy, unspecified: Secondary | ICD-10-CM | POA: Diagnosis not present

## 2024-02-07 ENCOUNTER — Ambulatory Visit (HOSPITAL_COMMUNITY): Payer: Medicare Other

## 2024-02-07 DIAGNOSIS — M546 Pain in thoracic spine: Secondary | ICD-10-CM | POA: Diagnosis not present

## 2024-02-07 DIAGNOSIS — R293 Abnormal posture: Secondary | ICD-10-CM

## 2024-02-07 DIAGNOSIS — M6281 Muscle weakness (generalized): Secondary | ICD-10-CM

## 2024-02-07 DIAGNOSIS — M5459 Other low back pain: Secondary | ICD-10-CM

## 2024-02-07 DIAGNOSIS — M542 Cervicalgia: Secondary | ICD-10-CM | POA: Diagnosis not present
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
## 2024-02-09 ENCOUNTER — Ambulatory Visit (HOSPITAL_COMMUNITY): Payer: Medicare Other

## 2024-02-09 DIAGNOSIS — R293 Abnormal posture: Secondary | ICD-10-CM | POA: Diagnosis not present

## 2024-02-09 DIAGNOSIS — M6281 Muscle weakness (generalized): Secondary | ICD-10-CM

## 2024-02-09 DIAGNOSIS — M546 Pain in thoracic spine: Secondary | ICD-10-CM | POA: Diagnosis not present

## 2024-02-09 DIAGNOSIS — M542 Cervicalgia: Secondary | ICD-10-CM | POA: Diagnosis not present

## 2024-02-09 DIAGNOSIS — M5459 Other low back pain: Secondary | ICD-10-CM
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
## 2024-02-13 ENCOUNTER — Telehealth (HOSPITAL_COMMUNITY): Payer: Self-pay

## 2024-02-13 ENCOUNTER — Encounter (HOSPITAL_COMMUNITY): Payer: Medicare Other

## 2024-02-13 NOTE — Telephone Encounter (Signed)
Patient called clinic today and was able to speak with Vickie [name] saying that she's sick and vomiting so she cannot make it today for her appointment.  [name]. [name], PT, DPT, OCS Board-Certified Clinical Specialist in Orthopedic PT PT Compact Privilege # ([place]): [phone] T

## 2024-02-23 ENCOUNTER — Ambulatory Visit (HOSPITAL_COMMUNITY): Payer: Medicare Other

## 2024-02-23 DIAGNOSIS — M546 Pain in thoracic spine: Secondary | ICD-10-CM | POA: Diagnosis not present

## 2024-02-23 DIAGNOSIS — M6281 Muscle weakness (generalized): Secondary | ICD-10-CM | POA: Diagnosis not present

## 2024-02-23 DIAGNOSIS — M5459 Other low back pain: Secondary | ICD-10-CM

## 2024-02-23 DIAGNOSIS — R293 Abnormal posture: Secondary | ICD-10-CM

## 2024-02-23 DIAGNOSIS — M542 Cervicalgia: Secondary | ICD-10-CM | POA: Diagnosis not present

## 2024-02-23 NOTE — Therapy (Signed)
 OUTPATIENT PHYSICAL THERAPY PROGRESS/DISCHARGE NOTE   Patient Name: Vickie [name] MRN: [id number] DOB:24-Jul-1946, 78 y.o., female Today's Date: 02/23/2024  END OF SESSION:   PT End of Session - 02/23/24 0754     Visit Number 12    Number of Visits 12    Date for PT Re-Evaluation 02/14/24    Authorization Type BCBS Medicare    Authorization Time Period no auth req    PT Start Time 0725    PT Stop Time 0750    PT Time Calculation (min) 25 min    Activity Tolerance Patient tolerated treatment well    Behavior During Therapy WFL for tasks assessed/performed             Past Medical History:  Diagnosis Date   Anginal pain (HCC)    Anxiety    Anxiety disorder    With apparent panic attacks   Arthritis    "hands, knees, back" (09/18/2017)   CAD S/P percutaneous coronary angioplasty 08/2017   Single-vessel CAD involving second diagonal branch treated with DES stent Synergy 2.25 mm x 12 mm (2.4 mm)   Chronic back pain    "all over" (09/18/2017)   COPD (chronic obstructive pulmonary disease) (HCC)    PFTs were done in 2008 at [facility]   Depression    Dyspnea    Dysrhythmia    LBBB   Essential hypertension    GERD (gastroesophageal reflux disease)    Hiatal hernia    History of left bundle branch block (LBBB) 08/2017   Rate Related LBBB noted in cath lab   Hyperlipidemia    Macular degeneration    right eye   Myocardial infarction [facility]) 2007   "medically induced"   Nonocclusive coronary atherosclerosis of native coronary artery 2006 through 2012   multi caths 2012, 2006, 2007 2009 (2007 complicated by catheter-induced dissection of small nondominant RCA, patent in 2009 with no residual abnormality); Myoview August 2013: LOW RISK, normal EF. ; Echocardiogram [name] - January 2014) moderate LVH, EF 55-65%. No significant valvular disease.   OSA (obstructive sleep apnea) 9/25/ 2012   tested 2009; tetested sleep study 07/2011--titration 09/20/2011 now use Bi-PAP    OSA on CPAP    Pneumonia    "several times in 2017; 3 times already this year" (09/18/2017)   Scoliosis    Type II diabetes mellitus (HCC)    Past Surgical History:  Procedure Laterality Date   BIOPSY  04/20/2022   Procedure: BIOPSY;  Surgeon: Malissa Hippo, MD;  [facility];  Service: Endoscopy;;   CARDIAC CATHETERIZATION  4098,1191; 2009   Non-occlusice CAD - only 80% ostial SP1;  NON DOMINANNT  RCA (catheter insuced dissection with MI in 2007 --> resolved by 2009 cath)   CATARACT EXTRACTION, BILATERAL Bilateral 2017   Toric lens "in the left eye only"   COLONOSCOPY WITH PROPOFOL N/A 11/09/2018   Procedure: COLONOSCOPY WITH PROPOFOL;  Surgeon: Malissa Hippo, MD;  [facility]: [facility];  Service: Endoscopy;  Laterality: N/A;  2:25   CORONARY ANGIOPLASTY     x 1 stent   CORONARY PRESSURE/FFR STUDY N/A 09/18/2017   Procedure: INTRAVASCULAR PRESSURE WIRE/FFR STUDY;  Surgeon: Marykay Lex, MD;  Location: [facility] INVASIVE CV LAB;  Service: Cardiovascular: FFR Diag 2 -- 0.78 (significcant) --> PCI   CORONARY STENT INTERVENTION N/A 09/18/2017   Procedure: CORONARY STENT INTERVENTION;  Surgeon: Marykay Lex, MD;  Location: [facility] INVASIVE CV LAB: DES PCI Diag2: Synergy DES 2.25  mm x 12 mm (2.4 mm)   DIAGNOSTIC LAPAROSCOPY Right    DILATION AND CURETTAGE OF UTERUS     ESOPHAGEAL DILATION N/A 04/20/2022   Procedure: ESOPHAGEAL DILATION;  Surgeon: Malissa Hippo, MD;  [facility]: [facility];  Service: Endoscopy;  Laterality: N/A;   ESOPHAGOGASTRODUODENOSCOPY (EGD) WITH PROPOFOL N/A 04/20/2022   Procedure: ESOPHAGOGASTRODUODENOSCOPY (EGD) WITH PROPOFOL;  Surgeon: Malissa Hippo, MD;  [facility];  Service: Endoscopy;  Laterality: N/A;  240   EYE SURGERY     KNEE ARTHROSCOPY Right    LEFT HEART CATH AND CORONARY ANGIOGRAPHY N/A 09/18/2017   Procedure: LEFT HEART CATH AND CORONARY ANGIOGRAPHY;  Surgeon: Marykay Lex, MD;  Location: [facility] INVASIVE CV LAB: [place] -  80% Diag2 (FFR 0.78)- PCI.  pLAD 40%, pCx 40%. Post PCI LBBB. LVEDP - ~20 mmHg. EF 55-60%   LIPOMA EXCISION Right ~ 2015   anterior abdomen   NM MYOVIEW LTD  08/04/2021   LOW RISK. EF 55-60%. Mild fixed apical defect - /w breast attenuation.   PARS PLANA VITRECTOMY W/ REPAIR OF MACULAR HOLE Right    Unsuccessful repair.  Hole filled   POLYPECTOMY  11/09/2018   Procedure: POLYPECTOMY;  Surgeon: Malissa Hippo, MD;  [facility];  Service: Endoscopy;;  colon   POLYPECTOMY  04/20/2022   Procedure: POLYPECTOMY;  Surgeon: Malissa Hippo, MD;  [facility];  Service: Endoscopy;;  bx of polyps   TRANSTHORACIC ECHOCARDIOGRAM  08/04/2021   Normal LVEF 55-60%.  Septal movement consistent with LBBB now present.  Unable to assess diastolic function.  Normal RV size and function.  Unable assess PAP.  Normal aortic and mitral valves.  Normal RAP.   VAGINAL HYSTERECTOMY     Patient Active Problem List   Diagnosis Date Noted   Lumbar back pain with radiculopathy affecting lower extremity 10/14/2023   History of colonic polyps    Left bundle branch block (LBBB) determined by electrocardiography 07/26/2021   Esophageal dysphagia 01/20/2021   Chest wall pain 09/01/2020   Goals of care, counseling/discussion 12/31/2019   COVID-19    Acute respiratory failure with hypoxia (HCC) 12/30/2019   Chronic hypoxic respiratory failure, on home oxygen therapy (HCC)    Increased oxygen demand    Pneumonia due to COVID-19 virus 12/28/2019   Thrombocytopenia (HCC) 12/28/2019   Acute hypoxemic respiratory failure due to COVID-19 [facility]) 12/27/2019   Chest pain with moderate risk for cardiac etiology 10/04/2017   Hypotension 10/02/2017   Status post coronary artery stent placement    Pre-op evaluation 09/05/2017   Special screening for malignant neoplasms, colon 07/31/2017   Elevated CK 07/30/2017   DOE (dyspnea on exertion) 07/28/2017   Bilateral lower extremity edema 09/10/2016    Claudication (HCC) 06/18/2015   Obesity (BMI 30-39.9) 04/19/2014   Coronary artery disease involving native coronary artery of native heart with angina pectoris (HCC)    Hyperlipidemia with target low density lipoprotein (LDL) cholesterol less than 55 mg/dL    Dysphagia 16/09/9603   Hypothyroidism 04/19/2008   DIABETES MELLITUS 04/19/2008   ANXIETY DEPRESSION 04/19/2008   Essential hypertension 04/19/2008   Myocardial infarction (HCC) 04/19/2008   GERD (gastroesophageal reflux disease) 04/19/2008   ISCHEMIC COLITIS 04/19/2008   ARTHRITIS 04/19/2008   Progress Note Reporting Period 01/03/24 to 02/23/24  See note below for Objective Data and Assessment of Progress/Goals.      PCP: Assunta Found, MDPCP - General   REFERRING PROVIDER: Clovis Riley, PA-CRef Provider  REFERRING DIAG:  Diagnosis  M54.6 (ICD-10-CM) - Pain in thoracic spine    Rationale for Evaluation and Treatment: Rehabilitation  THERAPY DIAG:  Other low back pain  Muscle weakness (generalized)  Abnormal posture  Pain in thoracic spine  ONSET DATE: September 2024     SUBJECTIVE:                                                                                                                                                                                           SUBJECTIVE STATEMENT:  PROGRESS NOTE 02/23/24: Currently reports pain = 3/10. Patient reports that her back hurt really bad last night and earlier this morning because she stood for almost 2 hours yesterday cooking. Patient used to be able to tolerate around 2 minutes of standing but now she can tolerate 1.5 hours of standing. Patient thinks that PT has helped her and that she is 90% better because she can now do her walking and standing. Patient states that she's doing her HEP and thinks that she can now be d/c from PT.   I.E: Patient reports that 4 month ago, she was getting off an ambulance. The EMT was helping patient come off of the  ambulance and "dropped" the patient, as she reports. This pain persisted for a year until patient went to [facility] September- November 2024. MD's performed MRI of lumbar and upper back, see below. Patient was prescribed medicine and was referred to [facility].    PERTINENT HISTORY:  CORONARY STENT INTERVENTION  2018 LEFT HEART CATH AND CORONARY ANGIOGRAPHY  2018 COPD( uses supplemental O2 @ home) Macular degeneration in the right eye  CPAP use at night     PAIN:  Are you having pain? Yes: NPRS scale: 6/10 Pain location: starts at upper back down to low back  Pain description: sharp, constant Aggravating factors: standing, lifting, bending  Relieving factors: n/a  PRECAUTIONS: None  RED FLAGS: None   WEIGHT BEARING RESTRICTIONS: No  FALLS:  Has patient fallen in last 6 months? Yes. Number of falls 1  LIVING ENVIRONMENT: Lives with: lives with their spouse Lives in: House/apartment Stairs: No Has following equipment at home: Single point cane, Environmental consultant - 2 wheeled, Tour manager, and Grab bars How many hours do you sleep every night: </= 6 hours due to pain    OCCUPATION: retired  PLOF: Independent   PATIENT GOALS: To be able to do ADLs pain-free  NEXT MD VISIT: TBD    OBJECTIVE:   DIAGNOSTIC FINDINGS:  Lumbar    IMPRESSION: 1. Transitional lumbosacral anatomy. For the purposes of this dictation, L5 is partially sacralized. Correlation with radiographs is  recommended prior to any operative intervention. 2. At L2-L3, moderate canal stenosis 3. At L4-L5, severe facet arthropathy with grade 1 anterolisthesis and mild canal stenosis. 4. Additional multilevel mild foraminal and canal stenosis is detailed above  Thoracic  Predominantly mild disc bulging throughout the thoracic spine. Moderate to severe facet arthrosis throughout the lower thoracic spine. Mild spinal stenosis and moderate left greater than right neural foraminal stenosis at T10-11 due to disc  bulging    PATIENT SURVEYS:  Modified Oswestry Low Back Pain Disability Questionnaire: 02/22/27: 9/50  = 18% from 30 / 50 = 60.0 %  SCREENING FOR RED FLAGS: Bowel or bladder incontinence: No Spinal tumors: No Cauda equina syndrome: No Compression fracture: No Abdominal aneurysm: No  COGNITION: Overall cognitive status: Within functional limits for tasks assessed  POSTURE: decreased lumbar lordosis and increased thoracic kyphosis      FUNCTIONAL TESTS:  5 times sit to stand: 02/23/24: 14.10 sec without UE assist from 14s using moderate upper extremity push; slow & steady with pain, contact guard assist   .20-29 years: 6.0  1.4 seconds 30-39 years: 6.1  1.4 seconds 40-49 years: 7.6  1.8 seconds 50-59 years: 7.7  2.6 seconds 60-69 years: 8.4  0.0 seconds (female), 12.7  1.8 seconds (female) 70-79 years: 11.6  3.4 seconds (female), 13.0  4.8 seconds (female) 80-89 years: 16.7  4.5 seconds (female), 17.2  5.5 seconds (female) 90+ years: 19.5  2.3 seconds (female), 22.9  9.6 seconds (female)  The Minimal Clinically Important Difference (MCID) is 2.3 seconds.  A score of 16 seconds or less indicates that the participant is not likely to fall.  A score of more than 16 seconds indicates a higher risk of falls  As per American Physical Therapy Association (APTA) website   GAIT ANALYSIS: Distance walked: 25 ft Assistive device utilized: None Level of assistance: SBA Comments: Patient with moderate unsteadiness; left antalgia   SENSATION: WFL   LOWER EXTREMITY MMT:     MMT Right eval Left eval Right 02/23/24 Left 02/23/24  Hip flexion 2+/5 2+/5 4- 4-  Hip extension      Hip abduction   4+ (seated)  4+ (seated)  Hip adduction      Hip internal rotation      Hip external rotation      Knee flexion 3+/5  3-/5 4- 4-  Knee extension 3+/5 3-/5 4- 4-  Ankle dorsiflexion      Ankle plantarflexion      Ankle inversion      Ankle eversion       (Blank rows = not  tested)  PALPATION: Moderate tenderness to palpation TH/ LU paraspinals     TODAY'S TREATMENT:                                                                                                                              DATE:  02/23/24 Progress note (modified ODI, MMT, 5TSTS)  02/09/24 Recumbent bike,  seat 4, level 2, 5' Seated hamstring stretch x 30" x 3 Seated open book x 10 on each Standing abdominal isometrics on // bars, neutral spine x 3" x 10 x 2 Standing hip vectors x RTB x 10 x 2 Standing 3 way horizontal abd, RTB x 5 x 2 Standing Pallof press x RTB x 10 x 2 on each  02/07/24 Recumbent bike, seat 4, level 1, 5' Seated hamstring stretch x 30" x 3 Seated open book x 10 on each Standing abdominal isometrics with physioball, neutral spine x 3" x 10 x 2 Standing hip ext, YTB x 10 x 2 Standing hip abd, YTB x 10 x 2  01/31/24 Recumbent bike, seat 4, level 1, 5' Seated hamstring stretch x 30" x 3 Seated abdominal isometrics with physioball, neutral spine x 3" x 10 x 2 Seated marches, neutral spine  x 10 x 2 Seated alt knee ext, neutral spine x 10 x 2 Standing hip ext x 10  01/29/24 Nustep level 4 6 minutes LE's only seat 6 Seated: LAQ Lt 2# 2X10  W backs 2X10 sitting edge of mat in good posture  Sit to stands 10X no UE assist  01/24/24  Supine   Manual therapy to left knee: PROM to left knee, gr 2-3 AP knee mobilizations for pain management, PROM   SAQs with 2# 2x10  DKTC with G physioball x10   LAQs with 2#  NuStep L2 Cool-down  01/22/24 Seated: abdominal iso with marching alternating 10X each  Abdominal iso with LAQ alternating 10X  WBacks 10X3" holds Supine: Bridge 2X10  SLR 2X10  March with abd bracing 10X   01/17/24 Supine  DKTC with G physioball 2x10  LTR with G physioball 2x10  Hip marches with 2# 2x10  Hip bridges 2x10  Manual therapy to left hip flexor: manual stretch  Gait Training using cane, 2 pt step to  01/15/24 NuStep Warm Up  L2 Standing  Weighted sled push with 40# 52ft x 6laps  S2S off elevated surface + Tidal Tank 2x5    01/10/24 Seated: thoracic extension with pillow in lower back 2X5 with 5" holds  Abdominal bracing march alternating 10X  Abdominal bracing LAQ alternating 10X  W backs 10X Standing: Doorway chest stretch 3X20"  Red theraband rows 10X  Red theraband shoulder extensions 10X  01/08/24 Supine  LTRs into PT contact  Ball squeezes 10x3-5"H  Hip abduction with G theraband 2x10  Hip marches with G theraband 2x10 Seated  Sitting theraband extension with G theraband x10  Seated pallof presses with G theraband x5 each Standing  Step to 4" step + theraband extension G theraband , PT contact guard assist   Weighted sled push with 40# 38ftx2, PT stand-by assist   01/03/24 PT I.E. Supine  LTR's into PT resistance 2x10  Ball Squeezes 10x 3-5" H  Partial hip marches x10 each Seated  Forward flexion stretch with XL Physioball 10x3"H   PATIENT EDUCATION:  Education details: HEP Person educated: Patient Education method: Explanation Education comprehension: verbalized understanding  HOME EXERCISE PROGRAM: Access Code: [email or web address] URL: [email or web address] 01/31/2024 - Standing Hip Extension with Counter Support  - 2 x daily - 7 x weekly - 10 reps  Date: 01/15/2024 Prepared by: Seymour Bars Exercises - Lower Trunk Rotations  - 1-2 x daily - 20 reps - Supine March with Resistance Band  - 1-2 x daily - 20 reps - Hooklying Clamshell with Resistance  - 1-2 x daily - 20 reps - Standing Row  with Resistance with Anchored Resistance at Chest Height Palms Down  - 2 x daily - 7 x weekly - 1 sets - 10 reps - Shoulder Extension with Resistance  - 2 x daily - 7 x weekly - 1 sets - 10 reps  ASSESSMENT:  CLINICAL IMPRESSION: PROGRESS NOTE 02/23/24: Patient demonstrated continued improvements in function as indicated by positive significant changes in modified ODI, and 5TSTS. Patient  also presented with increased strength and is now able to tolerate at lease 1.5 hours of standing. With this, skilled PT is not required at this time and patient is D/C from skilled PT. Patient may just continue her HEP on a daily basis for a month and start to taper after to facilitate carry-over of the gains in PT. Patient gave excellent verbal understanding.   I.E. Patient is a 78 y.o. y.o. female who was seen today for physical therapy evaluation and treatment for thoracic/lumbar pain, muscle weakness . Patient presents to PT with the following objective impairments: Abnormal gait, decreased activity tolerance, decreased endurance, decreased mobility, difficulty walking, decreased strength, increased muscle spasms, improper body mechanics, postural dysfunction, and pain. These impairments limit the patient in activities such as carrying, lifting, bending, sitting, standing, squatting, sleeping, stairs, locomotion level, and caring for others. These impairments also limit the patient in participation such as meal prep, cleaning, laundry, interpersonal relationship, driving, shopping, and community activity. The patient will benefit from PT to address the limitations/impairments listed below to return to their prior level of function in the domains of activity and participation.    PERSONAL FACTORS: Fitness are also affecting patient's functional outcome.   REHAB POTENTIAL: Fair    CLINICAL DECISION MAKING: Stable/uncomplicated  EVALUATION COMPLEXITY: Low    GOALS: Goals reviewed with patient? No  SHORT TERM GOALS: Target date: 01/24/2024   1. Patient will be independent with a basic stretching/strengthening HEP  Baseline:  Goal status: MET  2.   Patient will complete the 5 times sit to stand:  within 1 minute to demonstrate an improvement lower extremity strength needed for home and community ambulation  Baseline:  Goal status: MET   LONG TERM GOALS: Target date:  02/14/2024    Patient will be able to score a </= 50% on the Modified Oswestry    to demonstrate an improvement in overall housework, ADL completion, mobility, and self-care.Baseline:  18% from 60% Goal status: MET  2.   Patient will complete the 5 times sit to stand:  within 50 seconds  to demonstrate an improvement lower extremity strength needed for home and community ambulation  Baseline: 14.10 sec from 1 min and 44 sec Goal status:  MET  3.  Patient will be independent with a comprehensive strengthening HEP  Baseline:  Goal status: MET   PLAN:  PT FREQUENCY:  0  PT DURATION: other: 0  PLANNED INTERVENTIONS:  D/C from skilled PT. D/C to independent HEP .  [name]. Apolonio Cutting, PT, DPT, OCS Board-Certified Clinical Specialist in Orthopedic PT PT Compact Privilege # ([place]): [id number] T 02/23/2024, 7:55 AM   PHYSICAL THERAPY DISCHARGE SUMMARY  Visits from Start of Care: 12  Current functional level related to goals / functional outcomes: See above   Remaining deficits: See above   Education / Equipment: See above   Patient agrees to discharge. Patient goals were met. Patient is being discharged due to being pleased with the current functional level.  [name]. Aarian Cleaver, PT, DPT, OCS Board-Certified Clinical Specialist in Orthopedic PT PT Compact Privilege # (  [place]): [phone] T 02/23/2024, 7:55 AM

## 2024-02-29 ENCOUNTER — Ambulatory Visit: Payer: Medicare Other | Admitting: Pulmonary Disease

## 2024-02-29 DIAGNOSIS — J069 Acute upper respiratory infection, unspecified: Secondary | ICD-10-CM | POA: Diagnosis not present

## 2024-03-01 DIAGNOSIS — J069 Acute upper respiratory infection, unspecified: Secondary | ICD-10-CM | POA: Diagnosis not present

## 2024-03-01 DIAGNOSIS — Z20828 Contact with and (suspected) exposure to other viral communicable diseases: Secondary | ICD-10-CM | POA: Diagnosis not present

## 2024-03-05 ENCOUNTER — Other Ambulatory Visit (HOSPITAL_COMMUNITY): Payer: Self-pay | Admitting: Internal Medicine

## 2024-03-05 ENCOUNTER — Ambulatory Visit (HOSPITAL_COMMUNITY)
Admission: RE | Admit: 2024-03-05 | Discharge: 2024-03-05 | Disposition: A | Discharge status: Home / Self Care | Source: Ambulatory Visit | Attending: Internal Medicine | Admitting: Internal Medicine

## 2024-03-05 DIAGNOSIS — R059 Cough, unspecified: Secondary | ICD-10-CM | POA: Diagnosis not present

## 2024-03-05 DIAGNOSIS — M5126 Other intervertebral disc displacement, lumbar region: Secondary | ICD-10-CM | POA: Diagnosis not present

## 2024-03-05 DIAGNOSIS — R051 Acute cough: Secondary | ICD-10-CM | POA: Diagnosis not present

## 2024-03-05 DIAGNOSIS — E6609 Other obesity due to excess calories: Secondary | ICD-10-CM | POA: Diagnosis not present

## 2024-03-05 DIAGNOSIS — I10 Essential (primary) hypertension: Secondary | ICD-10-CM | POA: Diagnosis not present

## 2024-03-05 DIAGNOSIS — R918 Other nonspecific abnormal finding of lung field: Secondary | ICD-10-CM | POA: Diagnosis not present

## 2024-03-05 DIAGNOSIS — Z6835 Body mass index (BMI) 35.0-35.9, adult: Secondary | ICD-10-CM | POA: Diagnosis not present

## 2024-03-05 DIAGNOSIS — R0789 Other chest pain: Secondary | ICD-10-CM | POA: Diagnosis not present

## 2024-03-05 DIAGNOSIS — I517 Cardiomegaly: Secondary | ICD-10-CM | POA: Diagnosis not present

## 2024-03-05 DIAGNOSIS — E1159 Type 2 diabetes mellitus with other circulatory complications: Secondary | ICD-10-CM | POA: Diagnosis not present

## 2024-03-25 ENCOUNTER — Other Ambulatory Visit (HOSPITAL_COMMUNITY): Payer: Self-pay | Admitting: Internal Medicine

## 2024-03-25 ENCOUNTER — Ambulatory Visit (HOSPITAL_COMMUNITY)
Admission: RE | Admit: 2024-03-25 | Discharge: 2024-03-25 | Disposition: A | Discharge status: Home / Self Care | Source: Ambulatory Visit | Attending: Internal Medicine | Admitting: Internal Medicine

## 2024-03-25 DIAGNOSIS — J189 Pneumonia, unspecified organism: Secondary | ICD-10-CM

## 2024-03-25 DIAGNOSIS — Z0389 Encounter for observation for other suspected diseases and conditions ruled out: Secondary | ICD-10-CM | POA: Diagnosis not present

## 2024-03-27 ENCOUNTER — Ambulatory Visit: Payer: Medicare Other | Attending: Nurse Practitioner | Admitting: Nurse Practitioner

## 2024-03-27 ENCOUNTER — Encounter: Payer: Self-pay | Admitting: Nurse Practitioner

## 2024-03-27 VITALS — BP 118/68 | HR 62 | Ht 60.0 in | Wt 176.0 lb

## 2024-03-27 DIAGNOSIS — I10 Essential (primary) hypertension: Secondary | ICD-10-CM | POA: Diagnosis not present

## 2024-03-27 DIAGNOSIS — E785 Hyperlipidemia, unspecified: Secondary | ICD-10-CM | POA: Diagnosis not present

## 2024-03-27 DIAGNOSIS — I447 Left bundle-branch block, unspecified: Secondary | ICD-10-CM | POA: Diagnosis not present

## 2024-03-27 DIAGNOSIS — Z9981 Dependence on supplemental oxygen: Secondary | ICD-10-CM

## 2024-03-27 DIAGNOSIS — I25118 Atherosclerotic heart disease of native coronary artery with other forms of angina pectoris: Secondary | ICD-10-CM

## 2024-03-27 DIAGNOSIS — R7303 Prediabetes: Secondary | ICD-10-CM

## 2024-03-27 DIAGNOSIS — J9611 Chronic respiratory failure with hypoxia: Secondary | ICD-10-CM

## 2024-03-27 DIAGNOSIS — R600 Localized edema: Secondary | ICD-10-CM

## 2024-03-27 NOTE — Progress Notes (Signed)
 Office Visit    Patient Name: Vickie [name] Date of Encounter: 03/27/2024  Primary Care Provider:  Assunta Found, MD Primary Cardiologist:  Vickie Lemma, MD  Chief Complaint   78 year old female with a history of CAD s/p DES-D2 in 2018, LBBB, hypertension, hyperlipidemia, chronic bilateral lower extremity edema, chronic hypoxic respiratory failure on home O2, prediabetes, GERD, chronic back pain, anxiety, and depression who presents for follow-up related to CAD.  Past Medical History    Past Medical History:  Diagnosis Date   Anginal pain (HCC)    Anxiety    Anxiety disorder    With apparent panic attacks   Arthritis    "hands, knees, back" (09/18/2017)   CAD S/P percutaneous coronary angioplasty 08/2017   Single-vessel CAD involving second diagonal branch treated with DES stent Synergy 2.25 mm x 12 mm (2.4 mm)   Chronic back pain    "all over" (09/18/2017)   COPD (chronic obstructive pulmonary disease) (HCC)    PFTs were done in 2008 at [facility]   Depression    Dyspnea    Dysrhythmia    LBBB   Essential hypertension    GERD (gastroesophageal reflux disease)    Hiatal hernia    History of left bundle branch block (LBBB) 08/2017   Rate Related LBBB noted in cath lab   Hyperlipidemia    Macular degeneration    right eye   Myocardial infarction [facility]) 2007   "medically induced"   Nonocclusive coronary atherosclerosis of native coronary artery 2006 through 2012   multi caths 2012, 2006, 2007 2009 (2007 complicated by catheter-induced dissection of small nondominant RCA, patent in 2009 with no residual abnormality); Myoview August 2013: LOW RISK, normal EF. ; Echocardiogram Vickie [name] - January 2014) moderate LVH, EF 55-65%. No significant valvular disease.   OSA (obstructive sleep apnea) 9/25/ 2012   tested 2009; tetested sleep study 07/2011--titration 09/20/2011 now use Bi-PAP   OSA on CPAP    Pneumonia    "several times in 2017; 3 times already this year" (09/18/2017)    Scoliosis    Type II diabetes mellitus (HCC)    Past Surgical History:  Procedure Laterality Date   BIOPSY  04/20/2022   Procedure: BIOPSY;  Surgeon: Malissa Hippo, MD;  [facility];  Service: Endoscopy;;   CARDIAC CATHETERIZATION  [id number]; 2009   Non-occlusice CAD - only 80% ostial SP1;  NON DOMINANNT  RCA (catheter insuced dissection with MI in 2007 --> resolved by 2009 cath)   CATARACT EXTRACTION, BILATERAL Bilateral 2017   Toric lens "in the left eye only"   COLONOSCOPY WITH PROPOFOL N/A 11/09/2018   Procedure: COLONOSCOPY WITH PROPOFOL;  Surgeon: Malissa Hippo, MD;  [facility]: [facility];  Service: Endoscopy;  Laterality: N/A;  2:25   CORONARY ANGIOPLASTY     x 1 stent   CORONARY PRESSURE/FFR STUDY N/A 09/18/2017   Procedure: INTRAVASCULAR PRESSURE WIRE/FFR STUDY;  Surgeon: Marykay Lex, MD;  Location: [facility] INVASIVE CV LAB;  Service: Cardiovascular: FFR Diag 2 -- 0.78 (significcant) --> PCI   CORONARY STENT INTERVENTION N/A 09/18/2017   Procedure: CORONARY STENT INTERVENTION;  Surgeon: Marykay Lex, MD;  Location: [facility] INVASIVE CV LAB: DES PCI Diag2: Synergy DES 2.25 mm x 12 mm (2.4 mm)   DIAGNOSTIC LAPAROSCOPY Right    DILATION AND CURETTAGE OF UTERUS     ESOPHAGEAL DILATION N/A 04/20/2022   Procedure: ESOPHAGEAL DILATION;  Surgeon: Malissa Hippo, MD;  [facility];  Service: Endoscopy;  Laterality: N/A;   ESOPHAGOGASTRODUODENOSCOPY (EGD) WITH PROPOFOL N/A 04/20/2022   Procedure: ESOPHAGOGASTRODUODENOSCOPY (EGD) WITH PROPOFOL;  Surgeon: Malissa Hippo, MD;  [facility]: [facility];  Service: Endoscopy;  Laterality: N/A;  240   EYE SURGERY     KNEE ARTHROSCOPY Right    LEFT HEART CATH AND CORONARY ANGIOGRAPHY N/A 09/18/2017   Procedure: LEFT HEART CATH AND CORONARY ANGIOGRAPHY;  Surgeon: Marykay Lex, MD;  Location: [facility] INVASIVE CV LAB: Culpril - 80% Diag2 (FFR 0.78)- PCI.  pLAD 40%, pCx 40%. Post PCI LBBB. LVEDP - ~20 mmHg. EF 55-60%    LIPOMA EXCISION Right ~ 2015   anterior abdomen   NM MYOVIEW LTD  08/04/2021   LOW RISK. EF 55-60%. Mild fixed apical defect - /w breast attenuation.   PARS PLANA VITRECTOMY W/ REPAIR OF MACULAR HOLE Right    Unsuccessful repair.  Hole filled   POLYPECTOMY  11/09/2018   Procedure: POLYPECTOMY;  Surgeon: Malissa Hippo, MD;  [facility];  Service: Endoscopy;;  colon   POLYPECTOMY  04/20/2022   Procedure: POLYPECTOMY;  Surgeon: Malissa Hippo, MD;  [facility];  Service: Endoscopy;;  bx of polyps   TRANSTHORACIC ECHOCARDIOGRAM  08/04/2021   Normal LVEF 55-60%.  Septal movement consistent with LBBB now present.  Unable to assess diastolic function.  Normal RV size and function.  Unable assess PAP.  Normal aortic and mitral valves.  Normal RAP.   VAGINAL HYSTERECTOMY      Allergies  Allergies  Allergen Reactions   Adhesive [Tape] Other (See Comments)    REACTION: redness/irritation at application site. **Certain bandages/adhesives cause this reaction**   Avelox [Moxifloxacin] Anaphylaxis   Bactrim [Sulfamethoxazole-Trimethoprim] Anaphylaxis, Shortness Of Breath, Rash and Other (See Comments)    REACTION: Choking, inability to swallow, redness   Levaquin [Levofloxacin] Anaphylaxis, Shortness Of Breath, Rash and Other (See Comments)    Reaction:Choking Brand name Levaquin ok per pt   Mucinex [Guaifenesin Er] Anaphylaxis, Shortness Of Breath and Rash   Peanut-Containing Drug Products Anaphylaxis   Penicillins Other (See Comments)    "Passed out" Has patient had a PCN reaction causing immediate rash, facial/tongue/throat swelling, SOB or lightheadedness with hypotension: Unknown Has patient had a PCN reaction causing severe rash involving mucus membranes or skin necrosis: No Has patient had a PCN reaction that required hospitalization: No Has patient had a PCN reaction occurring within the last 10 years: No If all of the above answers are "NO", then may proceed  with Cephalosporin use.    Sudafed [Pseudoephedrine Hcl] Shortness Of Breath, Rash and Other (See Comments)    REACTION: Choking, redness, inability to swallow   Crestor [Rosuvastatin] Other (See Comments)    Leg pain   Lipitor [Atorvastatin] Other (See Comments)    Leg pain   Vytorin [Ezetimibe-Simvastatin] Other (See Comments)    Leg pain   Fish Allergy Nausea And Vomiting   Fish Oil Nausea And Vomiting   Norvasc [Amlodipine] Other (See Comments)    unknown   Shrimp [Shellfish Allergy] Nausea And Vomiting     Labs/Other Studies Reviewed    The following studies were reviewed today:  Cardiac Studies & Procedures   ______________________________________________________________________________________________ CARDIAC CATHETERIZATION  CARDIAC CATHETERIZATION 09/18/2017  Narrative Images from the original result were not included.   2nd Diag lesion, 80 %stenosed. FFR 0.78.  A STENT SYNERGY DES 2.25X12 drug eluting stent was successfully placed. Postdilated to perform a meter.  Post intervention, there is a 0%  residual stenosis.  Prox LAD lesion, 40 %stenosed. Focal calcified lesion at the first of the perforator and small first diagonal branch.  Prox Cx lesion, 40 %stenosed.  The left ventricular systolic function is normal. The left ventricular ejection fraction is 55-65% by visual estimate.  LV end diastolic pressure is normal.  Rate Related LBBB (rate in mid 70s)  Single vessel CAD as predicted by CT FFR. Successful FFR guided PCI of the 2nd Diag Branch with DES.  Plan: Mostly because the patient's anxiety, we will monitor her tonight with planned discharge tomorrow morning.  She will be on aspirin plus Plavix for minimum of 3 months at which time we can stop aspirin if necessary.  She has not been on a beta blocker in the past because of fatigue. -Would simply continue home medications.  She does have what appears to be a rate related left bundle branch block  converting to left bundle in the 70s bpm    Vickie [name], M.D., M.S. Interventional Cardiologist  Pager # [phone] Phone # [phone] [address] [place], [place] [address]  Findings Coronary Findings Diagnostic  Dominance: Left  Left Main Vessel is large.  Left Anterior Descending Vessel is large. The lesion is located at the major branch and concentric. The lesion is moderately calcified.  First Diagonal Branch Vessel is small in size.  First Septal Branch Vessel is small in size.  Second Diagonal Branch Vessel is moderate in size. The vessel is tortuous. The lesion is eccentric. Pressure wire/FFR was performed on the lesion. FFR: 0.78. 5 Fr EBU 3.5 Guide; IV Heparin Total 11,000 (ACT ~260) --&gt; Comet Wire for FFR. Adenosine infused 2 min @ 140 g/kg/min.  Initial FFR 0.87 --&gt; -.78. Comet Wire was then used for PCI.  Lateral Second Diagonal Branch Vessel is small in size.  Second Septal Branch Vessel is small in size.  Third Septal Branch Vessel is small in size.  Left Circumflex The lesion is tubular and concentric.  First Obtuse Marginal Branch Vessel is moderate in size. Vessel is angiographically normal. The vessel is tortuous.  Lateral First Obtuse Marginal Branch Vessel is small in size.  Fourth Obtuse Marginal Branch Vessel is small in size.  First Left Posterolateral Branch Vessel is small in size.  Second Left Posterolateral Branch Vessel is small in size.  Third Left Posterolateral Branch Vessel is small in size.  Right Coronary Artery Vessel is small.  Intervention  2nd Diag lesion Angioplasty Lesion length: 10 mm. Lesion crossed with guidewire using a GUIDEWIRE COMET PRESSURE. Pre-stent angioplasty was performed using a BALLOON SAPPHIRE 2.0X12. Maximum pressure: 8 atm. Inflation time: 20 sec. A STENT SYNERGY DES 2.25X12 drug eluting stent was successfully placed. Minimum lumen area: 2.4 mm. Stent strut is  well apposed. Post-stent angioplasty was performed using a STENT SYNERGY DES 2.25X12. Maximum pressure: 14 atm. Inflation time: 20 sec. Stent balloon - high Atm. The pre-interventional distal flow is normal (TIMI 3).  The post-interventional distal flow is normal (TIMI 3). The intervention was successful . No complications occurred at this lesion. CATH LAUNCHER 9F EBU3.5 There is a 0% residual stenosis post intervention.   CARDIAC CATHETERIZATION  CARDIAC CATHETERIZATION 07/14/2008   STRESS TESTS  MYOCARDIAL PERFUSION IMAGING 08/04/2021  Narrative  The left ventricular ejection fraction is normal (55-65%).  Nuclear stress EF: 58%. No wall motion abnormalities detected  There was no ST segment deviation noted during stress.  Defect 1: There is a small defect of mild severity present in  the apical anterior and apical septal location. The defect is nonreversible. This is likely representative of breast attenuation artifact.  This is a low risk study.  The study is normal. No ischemia or infarction identified.  Donato Schultz, MD   ECHOCARDIOGRAM  ECHOCARDIOGRAM COMPLETE 08/12/2021  Narrative ECHOCARDIOGRAM REPORT    Patient Name:   MAHREEN SCHEWE Date of Exam: 08/12/2021 Medical Rec #:  [id number]        Height:       60.0 in Accession #:    [id number]       Weight:       182.0 lb Date of Birth:  01-14-1946        BSA:          1.793 m Patient Age:    75 years         BP:           132/72 mmHg Patient Gender: F                HR:           61 bpm. Exam Location:  [address]  Procedure: 2D Echo and Intracardiac Opacification Agent  Indications:    Abnormal ECG R94.31  History:        Patient has prior history of Echocardiogram examinations, most recent 02/10/2020. CAD, Arrythmias:LBBB; Risk Factors:Hypertension.  Sonographer:    Thurman Coyer RDCS Referring Phys: [id number] [facility]   Sonographer Comments: Technically challenging study due to limited acoustic  windows, suboptimal parasternal window and suboptimal apical window. IMPRESSIONS   1. Left ventricular ejection fraction, by estimation, is 55 to 60%. The left ventricle has normal function. The left ventricle has no regional wall motion abnormalities. Left ventricular diastolic function could not be evaluated. 2. Right ventricular systolic function is normal. The right ventricular size is normal. Tricuspid regurgitation signal is inadequate for assessing PA pressure. 3. The mitral valve is grossly normal. Trivial mitral valve regurgitation. No evidence of mitral stenosis. 4. The aortic valve was not well visualized. Aortic valve regurgitation is not visualized. No aortic stenosis is present. 5. The inferior vena cava is normal in size with greater than 50% respiratory variability, suggesting right atrial pressure of 3 mmHg.  Comparison(s): Changes from prior study are noted. Septal movement consistent with LBBB now present.  FINDINGS Left Ventricle: Left ventricular ejection fraction, by estimation, is 55 to 60%. The left ventricle has normal function. The left ventricle has no regional wall motion abnormalities. Definity contrast agent was given IV to delineate the left ventricular endocardial borders. The left ventricular internal cavity size was normal in size. There is no left ventricular hypertrophy. Abnormal (paradoxical) septal motion, consistent with left bundle branch block. Left ventricular diastolic function could not be evaluated due to abnormal septal motion. Left ventricular diastolic function could not be evaluated.  Right Ventricle: The right ventricular size is normal. No increase in right ventricular wall thickness. Right ventricular systolic function is normal. Tricuspid regurgitation signal is inadequate for assessing PA pressure.  Left Atrium: Left atrial size was normal in size.  Right Atrium: Right atrial size was not well visualized.  Pericardium: The pericardium was  not well visualized.  Mitral Valve: The mitral valve is grossly normal. Trivial mitral valve regurgitation. No evidence of mitral valve stenosis. MV peak gradient, 7.6 mmHg. The mean mitral valve gradient is 3.5 mmHg.  Tricuspid Valve: The tricuspid valve is not well visualized. Tricuspid valve regurgitation is not demonstrated. No evidence of tricuspid  stenosis.  Aortic Valve: The aortic valve was not well visualized. Aortic valve regurgitation is not visualized. No aortic stenosis is present.  Pulmonic Valve: The pulmonic valve was grossly normal. Pulmonic valve regurgitation is not visualized. No evidence of pulmonic stenosis.  Aorta: The aortic root is normal in size and structure.  Venous: The inferior vena cava is normal in size with greater than 50% respiratory variability, suggesting right atrial pressure of 3 mmHg.  IAS/Shunts: The atrial septum is grossly normal.   LEFT VENTRICLE PLAX 2D LVIDd:         4.50 cm Diastology LVIDs:         2.90 cm LV e' medial:    4.06 cm/s LV PW:         1.20 cm LV E/e' medial:  34.0 LV IVS:        1.10 cm LV e' lateral:   6.68 cm/s LV E/e' lateral: 20.7   RIGHT VENTRICLE RV S prime:     12.50 cm/s TAPSE (M-mode): 2.0 cm  LEFT ATRIUM             Index       RIGHT ATRIUM           Index LA Vol (A2C):   68.0 ml 37.92 ml/m RA Area:     16.20 cm LA Vol (A4C):   52.8 ml 29.44 ml/m RA Volume:   43.40 ml  24.20 ml/m LA Biplane Vol: 60.7 ml 33.85 ml/m AORTIC VALVE LVOT Vmax:   125.00 cm/s LVOT Vmean:  79.800 cm/s LVOT VTI:    0.249 m  MITRAL VALVE MV Area (PHT): 3.03 cm     SHUNTS MV Peak grad:  7.6 mmHg     Systemic VTI: 0.25 m MV Mean grad:  3.5 mmHg MV Vmax:       1.38 m/s MV Vmean:      83.8 cm/s MV Decel Time: 250 msec MV E velocity: 138.00 cm/s MV A velocity: 131.00 cm/s MV E/A ratio:  1.05  Lennie Odor MD Electronically signed by Lennie Odor MD Signature Date/Time: 08/12/2021/6:50:17 PM    Final      CT  SCANS  CT CORONARY FRACTIONAL FLOW RESERVE DATA PREP 08/24/2017  Narrative EXAM: OVER-READ INTERPRETATION  CT CHEST  The following report is an over-read performed by radiologist Dr. Noe Gens [facility] on 08/23/2017. This over-read does not include interpretation of cardiac or coronary anatomy or pathology. The coronary CTA interpretation by the cardiologist is attached.  COMPARISON:  11/16/2016  FINDINGS: Cardiovascular: Heart is normal size. Visualized aorta is normal caliber with scattered calcifications in the arch and descending thoracic aorta.  Mediastinum/Nodes: No adenopathy in the lower mediastinum or hila.  Lungs/Pleura: No confluent opacities or effusions.  Upper Abdomen: Small low-density areas in the liver, likely small cysts. No acute findings.  Musculoskeletal: Chest wall soft tissues are unremarkable. No acute bony abnormality.  IMPRESSION: No acute extracardiac abnormality.  Aortic atherosclerosis.   Electronically Signed By: Charlett Nose M.D. On: 08/23/2017 09:38   CT CORONARY MORPH W/CTA COR W/SCORE 08/23/2017  Addendum 08/24/2017  6:00 PM ADDENDUM REPORT: 08/24/2017 17:58  ADDENDUM: Study sent for FFR-CT due to calciific disease in LAD diagonal  FFRCT abnormal in diagonal.80 Also noted borderline in mid circumflex .96  Results reviewed with Dr Herbie Baltimore   Electronically Signed By: Charlton Haws M.D. On: 08/24/2017 17:58  Addendum 08/23/2017 10:24 AM ADDENDUM REPORT: 08/23/2017 10:22  CLINICAL DATA:  Chest pain  EXAM: Cardiac  CTA  MEDICATIONS: Sub lingual nitro. 4mg  and lopressor 10mg   TECHNIQUE: The patient was scanned on a Siemens 192 slice scanner. Gantry rotation speed was 250 msecs. Collimation was.6 mm . A 100 kV prospective scan was triggered in the ascending thoracic aorta at 140 HU's with full mA between 35-75% of the R-R interval. Average HR during the scan was 54 bpm. The 3D data set was interpreted  on a dedicated work station using MPR, MIP and VRT modes. A total of 80cc of contrast was used.  FINDINGS: Non-cardiac: See separate report from [facility]. No significant findings on limited lung and soft tissue windows.  Calcium Score:  358 with dense calcium in LM and proximal LAD  Coronary Arteries: Right dominant with no anomalies  LM:  Less then 50% calcified stenosis  LAD:  50% calcified and mixed plaque in ostial and proximal vessel  D1:  Small and normal  D2:  Large and less than 30% calcified disease in ostium  Circumflex:  Less than 50% calcified disease proximal and mid vessel  OM1:  Normal  OM2: Normal  AV:  Normal  RCA: Dominant but most of PDA territory supplied by wrap around LAD normal  PDA:  Small normal  PLA:  Normal  IMPRESSION: 1) Calcium score 358 which is 89 % for age and sex  2) Moderate calcified stenosis in LM and proximal LAD not likely hemodynamically significant but given location will send study for FFR CT  3) Aortic atherosclerosis  Charlton Haws   Electronically Signed By: Charlton Haws M.D. On: 08/23/2017 10:22  Narrative EXAM: OVER-READ INTERPRETATION  CT CHEST  The following report is an over-read performed by radiologist Dr. Noe Gens [facility] on 08/23/2017. This over-read does not include interpretation of cardiac or coronary anatomy or pathology. The coronary CTA interpretation by the cardiologist is attached.  COMPARISON:  11/16/2016  FINDINGS: Cardiovascular: Heart is normal size. Visualized aorta is normal caliber with scattered calcifications in the arch and descending thoracic aorta.  Mediastinum/Nodes: No adenopathy in the lower mediastinum or hila.  Lungs/Pleura: No confluent opacities or effusions.  Upper Abdomen: Small low-density areas in the liver, likely small cysts. No acute findings.  Musculoskeletal: Chest wall soft tissues are unremarkable. No acute bony  abnormality.  IMPRESSION: No acute extracardiac abnormality.  Aortic atherosclerosis.  Electronically Signed: By: Charlett Nose M.D. On: 08/23/2017 09:38     ______________________________________________________________________________________________     Recent Labs: No results [name] for requested labs within last 365 days.  Recent Lipid Panel    Component Value Date/Time   CHOL 95 (L) 09/05/2023 1142   TRIG 144 09/05/2023 1142   HDL 59 09/05/2023 1142   CHOLHDL 1.6 09/05/2023 1142   CHOLHDL 3.6 09/09/2016 1021   VLDL 32 (H) 09/09/2016 1021   LDLCALC 12 09/05/2023 1142    History of Present Illness    78 year old female with the above past medical history including CAD s/p DES-D2 in 2018, LBBB, hypertension, hyperlipidemia, chronic bilateral lower extremity edema, chronic hypoxic respiratory failure on home O2, prediabetes, GERD, chronic back pain,  anxiety, and depression.  Coronary CT angiogram in 2018 showed moderate stenosis in the LM and proximal LAD of borderline FFR.  She underwent cardiac catheterization which revealed significant stenosis in the D2 s/p DES, residual 40% stenosis in the proximal LAD and proximal LCx, managed medically.  Lexiscan Myoview in 2022 was normal.  Most recent echocardiogram in 2022 showed EF 55 to 60%, normal LV function, no RWMA,  normal LV systolic function, no significant valvular abnormalities.  She was last seen in the office on 10/10/2023 and noted intermittent chest discomfort.  Ischemic evaluation was deferred.  Valsartan was increased in the setting of elevated BP.  She presents today for follow-up.  Her daughter is present by phone.  Since her last visit has been stable from a cardiac standpoint.  She reports a "couple" of episodes of chest pressure/tightness, unchanged from prior visits.  Her symptoms are relieved with nitroglycerin.  She notes stable mild dyspnea on exertion, this has been ongoing since COVID-19 infection in 2020.  She  has not been wearing her home oxygen consistently as prescribed.  She denies palpitations, dizziness, presyncope, syncope, PND, orthopnea, weight gain. Overall, she reports feeling well.   Home Medications    Current Outpatient Medications  Medication Sig Dispense Refill   albuterol (PROVENTIL) (2.5 MG/3ML) 0.083% nebulizer solution Take 3 mLs (2.5 mg total) by nebulization every 6 (six) hours as needed for wheezing or shortness of breath. 360 mL 5   albuterol (VENTOLIN HFA) 108 (90 Base) MCG/ACT inhaler Inhale 2 puffs into the lungs every 6 (six) hours as needed for wheezing or shortness of breath. 8 g 6   ALPRAZolam (XANAX) 0.5 MG tablet Take 0.25 mg by mouth 2 (two) times daily.     Alum Hydroxide-Mag Trisilicate (GAVISCON) 80-14.2 MG CHEW Chew 2 tablets by mouth at bedtime.     Biotin w/ Vitamins C & E (HAIR/SKIN/NAILS PO) Take 1 tablet by mouth at bedtime.     bisoprolol-hydrochlorothiazide (ZIAC) 2.5-6.25 MG tablet TAKE ONE TABLET BY MOUTH EVERY DAY 90 tablet 3   busPIRone (BUSPAR) 15 MG tablet Take 15 mg by mouth 2 (two) times daily.     Coenzyme Q10 (COQ10) 100 MG CAPS Take 100 mg by mouth every morning.     CRANBERRY PO Take 1 capsule by mouth daily.      escitalopram (LEXAPRO) 20 MG tablet Take 20 mg by mouth every morning.      esomeprazole (NEXIUM) 20 MG capsule Take 20 mg by mouth daily.     Evolocumab (REPATHA SURECLICK) 140 MG/ML SOAJ Inject 140 mg into the skin every 14 (fourteen) days. (Patient taking differently: Inject 1 mL into the skin every 14 (fourteen) days. Patient was out of medication since march just got new prescription) 2 mL 5   Fluticasone Furoate (ARNUITY ELLIPTA) 100 MCG/ACT AEPB Inhale 1 puff into the lungs daily in the afternoon. 30 each 5   furosemide (LASIX) 20 MG tablet Take 1 tablet (20 mg total) by mouth daily as needed.     isosorbide mononitrate (IMDUR) 60 MG 24 hr tablet TAKE ONE TABLET BY MOUTH ONCE DAILY. 90 tablet 3   mirtazapine (REMERON) 15 MG  tablet Take 7.5 mg by mouth at bedtime.      Multiple Vitamin (MULTIVITAMIN) tablet Take 1 tablet by mouth at bedtime.      nitroGLYCERIN (NITROSTAT) 0.4 MG SL tablet PLACE 1 TAB UNDER TONGUE EVERY 5 MIN IF NEEDED FOR CHEST PAIN. MAY USE 3 TIMES.NO RELIEF CALL 911. 50 tablet 5   potassium chloride SA (KLOR-CON M) 20 MEQ tablet Take 1 tablet (20 mEq total) by mouth daily as needed. Only take when lasix is needed     Tiotropium Bromide Monohydrate (SPIRIVA RESPIMAT) 2.5 MCG/ACT AERS Inhale 2 puffs into the lungs daily in the afternoon. 4 g 6   valsartan (DIOVAN) 80 MG tablet Take 1 tablet (80 mg total) by mouth daily.  90 tablet 3   amLODipine (NORVASC) 2.5 MG tablet Take 1 tablet (2.5 mg total) by mouth daily. 30 tablet 6   pravastatin (PRAVACHOL) 20 MG tablet Take 1 tablet (20 mg total) by mouth every evening. 90 tablet 3   No current facility-administered medications for this visit.     Review of Systems    She denies chest pain, palpitations, pnd, orthopnea, n, v, dizziness, syncope, edema, weight gain, or early satiety. All other systems reviewed and are otherwise negative except as noted above.   Physical Exam    VS:  BP 118/68 (BP Location: Left Arm, Patient Position: Sitting, Cuff Size: Normal)   Pulse 62   Ht 5' (1.524 m)   Wt 176 lb (79.8 kg)   SpO2 94%   BMI 34.37 kg/m  GEN: Well nourished, well developed, in no acute distress. HEENT: normal. Neck: Supple, no JVD, carotid bruits, or masses. Cardiac: RRR, no murmurs, rubs, or gallops. No clubbing, cyanosis, edema.  Radials/DP/PT 2+ and equal bilaterally.  Respiratory:  Respirations regular and unlabored, clear to auscultation bilaterally. GI: Soft, nontender, nondistended, BS + x 4. MS: no deformity or atrophy. Skin: warm and dry, no rash. Neuro:  Strength and sensation are intact. Psych: Normal affect.  Accessory Clinical Findings    ECG personally reviewed by me today - EKG Interpretation Date/Time:  Wednesday March 27 2024 09:13:12 EDT Ventricular Rate:  61 PR Interval:  198 QRS Duration:  94 QT Interval:  448 QTC Calculation: 450 R Axis:   14  Text Interpretation: Normal sinus rhythm Nonspecific ST and T wave abnormality noted on prior EKG (2021) No significant change was [name] Confirmed by Bernadene Person ([id number]) on 03/27/2024 9:17:23 AM  - no acute changes.   Lab Results  Component Value Date   WBC 7.7 01/01/2020   HGB 15.1 (H) 01/01/2020   HCT 45.4 01/01/2020   MCV 88.0 01/01/2020   PLT 292 01/01/2020   Lab Results  Component Value Date   CREATININE 0.64 04/18/2022   BUN 14 04/18/2022   NA 142 04/18/2022   K 4.1 04/18/2022   CL 108 04/18/2022   CO2 28 04/18/2022   Lab Results  Component Value Date   ALT 23 01/01/2020   AST 22 01/01/2020   ALKPHOS 64 01/01/2020   BILITOT 0.7 01/01/2020   Lab Results  Component Value Date   CHOL 95 (L) 09/05/2023   HDL 59 09/05/2023   LDLCALC 12 09/05/2023   TRIG 144 09/05/2023   CHOLHDL 1.6 09/05/2023    Lab Results  Component Value Date   HGBA1C 6.4 (H) 12/29/2019    Assessment & Plan    1. CAD/LBBB: S/p DES-D2 in 2018, cath showed residual 40% stenosis in the proximal LAD and proximal LCx, managed medically.  Lexiscan Myoview in 2022 was normal. Most recent echo as below.  She reports a "couple" of episodes of chest pressure/tightness, relieved with nitroglycerin,  unchanged from prior visits, overall stable.  She denies exertional chest pain.  No indication for ischemic evaluation at this time.  Continue to monitor for progressive symptoms. Continue amlodipine, valsartan, Imdur, pravastatin, Repatha.  2. Hypertension: BP well controlled. Continue current antihypertensive regimen.   3. Hyperlipidemia: LDL was 27 in 01/2024.  Continue pravastatin, Repatha.  4. Chronic bilateral lower extremity edema: Most recent echocardiogram in 2022 showed EF 55 to 60%, normal LV function, no RWMA, normal LV systolic function, no significant valvular  abnormalities. She has stable chronic dyspnea, denies worsening edema. Continue  Lasix as needed.  5. Chronic hypoxic respiratory failure: She reports stable chronic dyspnea on exertion.  She has not been using her home oxygen regularly. Encouraged adherence to oxygen.  Following with pulmonology.   6. Prediabetes:  A1c was 5.8 in 12/2023. Monitored and managed per PCP.    7. Disposition:  Follow-up in 6 months with Dr. Herbie Baltimore.       Joylene Grapes, NP 03/27/2024, 9:17 AM

## 2024-03-27 NOTE — Patient Instructions (Signed)
 Medication Instructions:  NONE   Lab Work: NONE    Testing/Procedures: NONE  Follow-Up: At [facility], you and your health needs are our priority.  As part of our continuing mission to provide you with exceptional heart care, our providers are all part of one team.  This team includes your primary Cardiologist (physician) and Advanced Practice Providers or APPs (Physician Assistants and Nurse Practitioners) who all work together to provide you with the care you need, when you need it.  Your next appointment:   6 month(s)  Provider:   Bryan Lemma, MD     Other Instructions:       1st Floor: - Lobby - Registration  - Pharmacy  - Lab - Cafe  2nd Floor: - PV Lab - Diagnostic Testing (echo, CT, nuclear med)  3rd Floor: - Vacant  4th Floor: - TCTS (cardiothoracic surgery) - AFib Clinic - Structural Heart Clinic - Vascular Surgery  - Vascular Ultrasound  5th Floor: - HeartCare Cardiology (general and EP) - Clinical Pharmacy for coumadin, hypertension, lipid, weight-loss medications, and med management appointments    Valet parking services will be available as well.

## 2024-04-05 ENCOUNTER — Telehealth: Payer: Self-pay | Admitting: Pulmonary Disease

## 2024-04-05 DIAGNOSIS — J849 Interstitial pulmonary disease, unspecified: Secondary | ICD-10-CM

## 2024-04-05 NOTE — Telephone Encounter (Signed)
 I had to cacnel this PT's appointment for a PFT due to no tech's. I can not find a Full PFT opening before her appt w/Dr. Wynona Neat. She is willing to go to [facility] before the FU appt. Can we sched this for her? Her # is [phone]

## 2024-04-08 ENCOUNTER — Other Ambulatory Visit: Payer: Self-pay

## 2024-04-08 DIAGNOSIS — J849 Interstitial pulmonary disease, unspecified: Secondary | ICD-10-CM

## 2024-04-08 NOTE — Telephone Encounter (Signed)
 Left vm to sch pft

## 2024-04-08 NOTE — Telephone Encounter (Signed)
 Order has been placed for a new PFT.   Nothing else further needed

## 2024-04-08 NOTE — Telephone Encounter (Signed)
 spoke to Vickie [name] at respiratory department ref# [id number] for pt appt for PFT on 23rd is being held.   pts order for PFT needs to be extended as order is only good til 4/16     Please advise

## 2024-04-10 NOTE — Telephone Encounter (Signed)
 I sent a new PFT order for this. NFN

## 2024-04-10 NOTE — Addendum Note (Signed)
 Addended by: Anise Kerns T on: 04/10/2024 09:05 AM   Modules accepted: Orders

## 2024-04-15 ENCOUNTER — Other Ambulatory Visit: Payer: Self-pay | Admitting: Cardiology

## 2024-04-15 DIAGNOSIS — E785 Hyperlipidemia, unspecified: Secondary | ICD-10-CM

## 2024-04-15 DIAGNOSIS — I25119 Atherosclerotic heart disease of native coronary artery with unspecified angina pectoris: Secondary | ICD-10-CM

## 2024-04-16 DIAGNOSIS — E1140 Type 2 diabetes mellitus with diabetic neuropathy, unspecified: Secondary | ICD-10-CM | POA: Diagnosis not present

## 2024-04-16 DIAGNOSIS — E1151 Type 2 diabetes mellitus with diabetic peripheral angiopathy without gangrene: Secondary | ICD-10-CM | POA: Diagnosis not present

## 2024-04-17 ENCOUNTER — Other Ambulatory Visit: Payer: Self-pay | Admitting: Cardiology

## 2024-04-17 ENCOUNTER — Ambulatory Visit (HOSPITAL_COMMUNITY)
Admission: RE | Admit: 2024-04-17 | Discharge: 2024-04-17 | Disposition: A | Discharge status: Home / Self Care | Source: Ambulatory Visit | Attending: Pulmonary Disease | Admitting: Pulmonary Disease

## 2024-04-17 DIAGNOSIS — J849 Interstitial pulmonary disease, unspecified: Secondary | ICD-10-CM | POA: Insufficient documentation

## 2024-04-17 LAB — PULMONARY FUNCTION TEST
DL/VA % pred: 120 %
DL/VA: 5.1 ml/min/mmHg/L
DLCO unc % pred: 84 %
DLCO unc: 14.02 ml/min/mmHg
FEF 25-75 Post: 1.85 L/s
FEF 25-75 Pre: 1 L/s
FEF2575-%Change-Post: 85 %
FEF2575-%Pred-Post: 133 %
FEF2575-%Pred-Pre: 71 %
FEV1-%Change-Post: 11 %
FEV1-%Pred-Post: 79 %
FEV1-%Pred-Pre: 70 %
FEV1-Post: 1.35 L
FEV1-Pre: 1.21 L
FEV1FVC-%Change-Post: 7 %
FEV1FVC-%Pred-Pre: 103 %
FEV6-%Change-Post: 4 %
FEV6-%Pred-Post: 74 %
FEV6-%Pred-Pre: 71 %
FEV6-Post: 1.63 L
FEV6-Pre: 1.56 L
FEV6FVC-%Change-Post: 0 %
FEV6FVC-%Pred-Post: 105 %
FEV6FVC-%Pred-Pre: 105 %
FVC-%Change-Post: 3 %
FVC-%Pred-Post: 70 %
FVC-%Pred-Pre: 68 %
FVC-Post: 1.63 L
FVC-Pre: 1.57 L
Post FEV1/FVC ratio: 83 %
Post FEV6/FVC ratio: 100 %
Pre FEV1/FVC ratio: 77 %
Pre FEV6/FVC Ratio: 100 %
RV % pred: 74 %
RV: 1.56 L
TLC % pred: 72 %
TLC: 3.22 L

## 2024-04-17 MED ORDER — ALBUTEROL SULFATE (2.5 MG/3ML) 0.083% IN NEBU
2.5000 mg | INHALATION_SOLUTION | Freq: Once | RESPIRATORY_TRACT | Status: AC
  Administered 2024-04-17: 2.5 mg via RESPIRATORY_TRACT

## 2024-04-18 ENCOUNTER — Other Ambulatory Visit: Payer: Self-pay | Admitting: Cardiology

## 2024-04-22 ENCOUNTER — Ambulatory Visit: Payer: Medicare Other | Admitting: Pulmonary Disease

## 2024-04-22 ENCOUNTER — Encounter: Payer: Self-pay | Admitting: Pulmonary Disease

## 2024-04-22 VITALS — BP 135/59 | HR 59 | Ht 63.0 in | Wt 178.0 lb

## 2024-04-22 DIAGNOSIS — J849 Interstitial pulmonary disease, unspecified: Secondary | ICD-10-CM | POA: Diagnosis not present

## 2024-04-22 NOTE — Progress Notes (Signed)
 Vickie [name]    [id number]    05/16/1946  Primary Care Physician:Golding, Autry Legions, MD  Referring Physician: Minus Amel, MD [address] [place],  [place] [address]  Chief complaint:   Patient being seen for obstructive sleep apnea, interstitial lung disease  HPI: Patient was seen about 3 months ago  Has been doing relatively well  Post-COVID interstitial lung disease , Had COVID in January 2021  She does have cough shortness of breath Limited with activities  She was on oxygen  supplementation but has not been using the oxygen  around-the-clock Saturations around about 90-93  She does have a portable concentrator She is wondering whether she can qualify for an iron Ogen  Tries to use her CPAP on a nightly basis she is Night Here and There Has Been Working Relatively Well for Her  Reformed smoker  History of hypertension, hyperlipidemia, osteoarthritis, GERD, depression, anxiety, left bundle branch block, macular degeneration, type 2 diabetes, coronary artery disease   Outpatient Encounter Medications as of 04/22/2024  Medication Sig   albuterol  (PROVENTIL ) (2.5 MG/3ML) 0.083% nebulizer solution Take 3 mLs (2.5 mg total) by nebulization every 6 (six) hours as needed for wheezing or shortness of breath.   albuterol  (VENTOLIN  HFA) 108 (90 Base) MCG/ACT inhaler Inhale 2 puffs into the lungs every 6 (six) hours as needed for wheezing or shortness of breath.   ALPRAZolam  (XANAX ) 0.5 MG tablet Take 0.25 mg by mouth 2 (two) times daily.   Alum Hydroxide-Mag Trisilicate (GAVISCON) 80-14.2 MG CHEW Chew 2 tablets by mouth at bedtime.   amLODipine  (NORVASC ) 2.5 MG tablet TAKE ONE TABLET BY MOUTH ONCE DAILY.   Biotin w/ Vitamins C & E (HAIR/SKIN/NAILS PO) Take 1 tablet by mouth at bedtime.   bisoprolol -hydrochlorothiazide  (ZIAC ) 2.5-6.25 MG tablet TAKE ONE TABLET BY MOUTH EVERY DAY   busPIRone  (BUSPAR ) 15 MG tablet Take 15 mg by mouth 2 (two) times daily.    Coenzyme Q10 (COQ10) 100 MG CAPS Take 100 mg by mouth every morning.   CRANBERRY PO Take 1 capsule by mouth daily.    escitalopram  (LEXAPRO ) 20 MG tablet Take 20 mg by mouth every morning.    esomeprazole (NEXIUM) 20 MG capsule Take 20 mg by mouth daily.   Evolocumab  (REPATHA  SURECLICK) 140 MG/ML SOAJ INJECT INTO THE SKIN ONCE EVERY 14 DAYS.   Fluticasone  Furoate (ARNUITY ELLIPTA ) 100 MCG/ACT AEPB Inhale 1 puff into the lungs daily in the afternoon.   furosemide  (LASIX ) 20 MG tablet TAKE ONE TABLET EVERY OTHER DAY.   isosorbide  mononitrate (IMDUR ) 60 MG 24 hr tablet TAKE ONE TABLET BY MOUTH ONCE DAILY.   mirtazapine  (REMERON ) 15 MG tablet Take 7.5 mg by mouth at bedtime.    Multiple Vitamin (MULTIVITAMIN) tablet Take 1 tablet by mouth at bedtime.    nitroGLYCERIN  (NITROSTAT ) 0.4 MG SL tablet PLACE 1 TAB UNDER TONGUE EVERY 5 MIN IF NEEDED FOR CHEST PAIN. MAY USE 3 TIMES.NO RELIEF CALL 911.   potassium chloride  SA (KLOR-CON  M) 20 MEQ tablet Take 1 tablet (20 mEq total) by mouth daily as needed. Only take when lasix  is needed   Tiotropium Bromide  Monohydrate (SPIRIVA  RESPIMAT) 2.5 MCG/ACT AERS Inhale 2 puffs into the lungs daily in the afternoon.   valsartan  (DIOVAN ) 80 MG tablet Take 1 tablet (80 mg total) by mouth daily.   pravastatin  (PRAVACHOL ) 20 MG tablet Take 1 tablet (20 mg total) by mouth every evening.   No facility-administered encounter medications on file as  of 04/22/2024.    Allergies as of 04/22/2024 - Review Complete 04/22/2024  Allergen Reaction Noted   Adhesive [tape] Other (See Comments) 07/18/2012   Avelox [moxifloxacin] Anaphylaxis 01/27/2016   Bactrim [sulfamethoxazole-trimethoprim] Anaphylaxis, Shortness Of Breath, Rash, and Other (See Comments) 07/18/2012   Levaquin [levofloxacin] Anaphylaxis, Shortness Of Breath, Rash, and Other (See Comments) 03/31/2013   Mucinex  [guaifenesin  er] Anaphylaxis, Shortness Of Breath, and Rash 04/18/2014   Peanut-containing drug  products Anaphylaxis 08/12/2020   Penicillins Other (See Comments) 07/18/2012   Sudafed [pseudoephedrine hcl] Shortness Of Breath, Rash, and Other (See Comments) 07/18/2012   Crestor [rosuvastatin] Other (See Comments) 04/15/2014   Lipitor [atorvastatin] Other (See Comments) 04/15/2014   Vytorin [ezetimibe-simvastatin] Other (See Comments) 04/15/2014   Fish allergy Nausea And Vomiting 04/14/2022   Fish oil Nausea And Vomiting 11/25/2020   Norvasc  [amlodipine ] Other (See Comments) 04/15/2014   Shrimp [shellfish allergy] Nausea And Vomiting 08/12/2020    Past Medical History:  Diagnosis Date   Anginal pain (HCC)    Anxiety    Anxiety disorder    With apparent panic attacks   Arthritis    "hands, knees, back" (09/18/2017)   CAD S/P percutaneous coronary angioplasty 08/2017   Single-vessel CAD involving second diagonal branch treated with DES stent Synergy 2.25 mm x 12 mm (2.4 mm)   Chronic back pain    "all over" (09/18/2017)   COPD (chronic obstructive pulmonary disease) (HCC)    [facility] done in [facility]   Depression    Dyspnea    Dysrhythmia    LBBB   Essential hypertension    GERD (gastroesophageal reflux disease)    Hiatal hernia    History of left bundle branch block (LBBB) 08/2017   Rate Related LBBB noted in cath lab   Hyperlipidemia    Macular degeneration    right eye   Myocardial infarction [facility]) 2007   "medically induced"   Nonocclusive coronary atherosclerosis of native coronary artery 2006 through 2012   multi caths 2012, 2006, 2007 2009 (2007 complicated by catheter-induced dissection of small nondominant RCA, patent in 2009 with no residual abnormality); Myoview  August 2013: LOW RISK, normal EF. ; Echocardiogram [facility] - January 2014) moderate LVH, EF 55-65%. No significant valvular disease.   OSA (obstructive sleep apnea) 9/25/ 2012   tested 2009; tetested sleep study 07/2011--titration 09/20/2011 now use Bi-PAP   OSA on CPAP    Pneumonia     "several times in 2017; 3 times already this year" (09/18/2017)   Scoliosis    Type II diabetes mellitus (HCC)     Past Surgical History:  Procedure Laterality Date   BIOPSY  04/20/2022   Procedure: BIOPSY;  Surgeon: Ruby Corporal, MD;  Location: [facility];  Service: Endoscopy;;   CARDIAC CATHETERIZATION  9604,[id number]; 2009   Non-occlusice CAD - only 80% ostial SP1;  NON DOMINANNT  RCA (catheter insuced dissection with MI in 2007 --> resolved by 2009 cath)   CATARACT EXTRACTION, BILATERAL Bilateral 2017   Toric lens "in the left eye only"   COLONOSCOPY WITH PROPOFOL  N/A 11/09/2018   Procedure: COLONOSCOPY WITH PROPOFOL ;  Surgeon: Ruby Corporal, MD;  Location: [facility];  Service: Endoscopy;  Laterality: N/A;  2:25   CORONARY ANGIOPLASTY     x 1 stent   CORONARY PRESSURE/FFR STUDY N/A 09/18/2017   Procedure: INTRAVASCULAR PRESSURE WIRE/FFR STUDY;  Surgeon: Arleen Lacer, MD;  Location: [facility] INVASIVE CV LAB;  Service: Cardiovascular: FFR Diag 2 --  0.78 (significcant) --> PCI   CORONARY STENT INTERVENTION N/A 09/18/2017   Procedure: CORONARY STENT INTERVENTION;  Surgeon: Arleen Lacer, MD;  Location: [facility] INVASIVE CV LAB: DES PCI Diag2: Synergy DES 2.25 mm x 12 mm (2.4 mm)   DIAGNOSTIC LAPAROSCOPY Right    DILATION AND CURETTAGE OF UTERUS     ESOPHAGEAL DILATION N/A 04/20/2022   Procedure: ESOPHAGEAL DILATION;  Surgeon: Ruby Corporal, MD;  Location: [facility];  Service: Endoscopy;  Laterality: N/A;   ESOPHAGOGASTRODUODENOSCOPY (EGD) WITH PROPOFOL  N/A 04/20/2022   Procedure: ESOPHAGOGASTRODUODENOSCOPY (EGD) WITH PROPOFOL ;  Surgeon: Ruby Corporal, MD;  Location[facility];  Service: Endoscopy;  Laterality: N/A;  240   EYE SURGERY     KNEE ARTHROSCOPY Right    LEFT HEART CATH AND CORONARY ANGIOGRAPHY N/A 09/18/2017   Procedure: LEFT HEART CATH AND CORONARY ANGIOGRAPHY;  Surgeon: Arleen Lacer, MD;  Location: [facility] INVASIVE CV LAB: Culpril - 80% Diag2 (FFR 0.78)- PCI.   pLAD 40%, pCx 40%. Post PCI LBBB. LVEDP - ~20 mmHg. EF 55-60%   LIPOMA EXCISION Right ~ 2015   anterior abdomen   NM MYOVIEW  LTD  08/04/2021   LOW RISK. EF 55-60%. Mild fixed apical defect - /w breast attenuation.   PARS PLANA VITRECTOMY W/ REPAIR OF MACULAR HOLE Right    Unsuccessful repair.  Hole filled   POLYPECTOMY  11/09/2018   Procedure: POLYPECTOMY;  Surgeon: Ruby Corporal, MD;  [facility];  Service: Endoscopy;;  colon   POLYPECTOMY  04/20/2022   Procedure: POLYPECTOMY;  Surgeon: Ruby Corporal, MD;  [facility];  Service: Endoscopy;;  bx of polyps   TRANSTHORACIC ECHOCARDIOGRAM  08/04/2021   Normal LVEF 55-60%.  Septal movement consistent with LBBB now present.  Unable to assess diastolic function.  Normal RV size and function.  Unable assess PAP.  Normal aortic and mitral valves.  Normal RAP.   VAGINAL HYSTERECTOMY      Family History  Problem Relation Age of Onset   CAD Mother    Breast cancer Maternal Aunt    Breast cancer Paternal Aunt     Social History   Socioeconomic History   Marital status: Married    Spouse name: Not on file   Number of children: Not on file   Years of education: Not on file   Highest education level: Not on file  Occupational History   Not on file  Tobacco Use   Smoking status: Former    Current packs/day: 0.00    Average packs/day: 3.0 packs/day for 8.5 years (25.5 ttl pk-yrs)    Types: Cigarettes    Start date: 10/19/1967    Quit date: 04/18/1976    Years since quitting: 48.0    Passive exposure: Past   Smokeless tobacco: Never  Vaping Use   Vaping status: Never Used  Substance and Sexual Activity   Alcohol  use: No   Drug use: No   Sexual activity: Not Currently    Birth control/protection: None  Other Topics Concern   Not on file  Social History Narrative   Married mother of 4, grandmother of 7. Her mother is 61 years old. Quit smoking 34 years ago. Does not drink alcohol .  Retired from [employer] in 2012.   Usually presents with oldest daughter.   Previously worked out at [employer] regularly walking 1/2-1 mile a day, but no longer able to do so because of the [employer] decision  to no longer cover Silver Sneakers cost.      Was made DNI during her hospital stay for COVID-19 in January 2021, now that she has recovered from [facility] and doing well, she has rescinded this and is now full code.   Social Drivers of Corporate investment banker Strain: Low Risk  (11/29/2022)   Overall Financial Resource Strain (CARDIA)    Difficulty of Paying Living Expenses: Not hard at all  Food Insecurity: No Food Insecurity (11/29/2022)   Hunger Vital Sign    Worried About Running Out of Food in the Last Year: Never true    Ran Out of Food in the Last Year: Never true  Transportation Needs: No Transportation Needs (11/29/2022)   PRAPARE - Administrator, Civil Service (Medical): No    Lack of Transportation (Non-Medical): No  Physical Activity: Not on file  Stress: Not on file  Social Connections: Not on file  Intimate Partner Violence: Not on file    Review of Systems  Constitutional:  Negative for diaphoresis and fatigue.  Respiratory:  Positive for apnea, cough and shortness of breath.   Psychiatric/Behavioral:  Positive for sleep disturbance.     Vitals:   04/22/24 0950  BP: (!) 135/59  Pulse: (!) 59  SpO2: 93%     Physical Exam Constitutional:      Appearance: She is obese.  HENT:     Head: Normocephalic.     Nose: Nose normal.     Mouth/Throat:     Mouth: Mucous membranes are moist.  Eyes:     General: No scleral icterus. Cardiovascular:     Rate and Rhythm: Normal rate and regular rhythm.     Heart sounds: No murmur heard.    No friction rub.  Pulmonary:     Effort: No respiratory distress.     Breath sounds: No stridor. No wheezing, rhonchi or rales.  Musculoskeletal:     Cervical back: No rigidity or tenderness.  Neurological:      Mental Status: She is alert.  Psychiatric:        Mood and Affect: Mood normal.    Data Reviewed: CT scan of the chest was reviewed by myself showing pulmonary fibrosis  PFT from 2022 compared with previous from 3 years ago showing stable findings  CPAP compliance reviewed showing excellent compliance with CPAP 77% Using CPAP for 7 hours 34 minutes CPAP of 9 Residual AHI of 3.8  Most recent PFT shows stable findings compared to one performed 3 years ago  Assessment:  Obstructive sleep apnea - Encouraged to continue using CPAP nightly  Pulmonary fibrosis - Post-COVID fibrosis - PFT has stayed stable from 3 years ago  Deconditioning - Graded activities as tolerated  Plan/Recommendations: Encouraged to exercise regularly  Encouraged to use CPAP on a nightly basis - CPAP seems to be working well  She will be ambulated in the office today to qualify for portable concentrator if possible  Importance of regular exercises was discussed extensively  I will see her back in about 4 months  Ambulatory oximetry today  - Desaturated to 88% - Required 2 L of oxygen  for ambulation - Prescription will be placed for 2 L of oxygen   Myer Artis MD [facility] 04/22/2024, 9:51 AM  CC: Minus Amel, MD  Addendum: Carin Charleston, CMA Mon Apr 22, 2024 10:42 AM   Verified DME: [employer] Not using O2 Cpap: [employer]  Verified 04/22/24 Melodee Spruce CMA  SATURATION QUALIFICATIONS: (This note is used to comply with regulatory documentation for home oxygen )   Patient Saturations on Room Air at Rest = 92%   Patient Saturations on Room Air while Ambulating = 85%   Patient Saturations on 2 Liters of oxygen  while Ambulating = 93%     SATURATION QUALIFICATIONS: (This note is used to comply with regulatory documentation for home oxygen )   Patient Saturations on Room Air at Rest = 98%   Patient Saturations on Room Air while Ambulating = 88%   Patient Saturations on  2 Liters pulsed oxygen  while Ambulating = 97%   Please briefly explain why patient needs home oxygen : Pt qualified 2L POC to obtain o2 88-90%. Pt would like [employer] POC Qualification walk 4/28 [facility] CMA

## 2024-04-22 NOTE — Patient Instructions (Signed)
 Ambulatory oximetry today to qualify for [employer]  Graded activities as tolerated  Try and do as much exercise as you can tolerate and try and build up from there  Continue using your CPAP on a nightly basis - The download from your machine shows that is working well  Your breathing study that was done recently is comparable to what you had done about 3 years ago with no significant change in  Call us  with significant concerns  It is very important to focus on exercising regularly  Follow-up in about 4 months

## 2024-04-23 ENCOUNTER — Telehealth: Payer: Self-pay | Admitting: Pulmonary Disease

## 2024-04-23 NOTE — Telephone Encounter (Signed)
 Cleotilde Dago; Deputy, Winston Hawking; Tucker, Dolanda; Cain, Brenita Callow Can we have the provider co-sign OR copy and paste the nurses sats into their OV progress note? She is considered a new start.

## 2024-04-24 NOTE — Telephone Encounter (Signed)
 Order is good to go thank you

## 2024-05-08 DIAGNOSIS — Z6835 Body mass index (BMI) 35.0-35.9, adult: Secondary | ICD-10-CM | POA: Diagnosis not present

## 2024-05-08 DIAGNOSIS — I10 Essential (primary) hypertension: Secondary | ICD-10-CM | POA: Diagnosis not present

## 2024-05-08 DIAGNOSIS — E1140 Type 2 diabetes mellitus with diabetic neuropathy, unspecified: Secondary | ICD-10-CM | POA: Diagnosis not present

## 2024-05-08 DIAGNOSIS — J042 Acute laryngotracheitis: Secondary | ICD-10-CM | POA: Diagnosis not present

## 2024-05-08 DIAGNOSIS — E6609 Other obesity due to excess calories: Secondary | ICD-10-CM | POA: Diagnosis not present

## 2024-05-08 DIAGNOSIS — J329 Chronic sinusitis, unspecified: Secondary | ICD-10-CM | POA: Diagnosis not present

## 2024-05-08 DIAGNOSIS — J449 Chronic obstructive pulmonary disease, unspecified: Secondary | ICD-10-CM | POA: Diagnosis not present

## 2024-05-08 DIAGNOSIS — E1159 Type 2 diabetes mellitus with other circulatory complications: Secondary | ICD-10-CM | POA: Diagnosis not present

## 2024-05-15 ENCOUNTER — Other Ambulatory Visit: Payer: Self-pay | Admitting: Pulmonary Disease

## 2024-05-15 ENCOUNTER — Ambulatory Visit: Payer: Self-pay | Admitting: Pulmonary Disease

## 2024-05-15 MED ORDER — SPIRIVA RESPIMAT 2.5 MCG/ACT IN AERS: 2.0000 | INHALATION_SPRAY | Freq: Every day | RESPIRATORY_TRACT | 6 refills | Status: DC

## 2024-05-15 NOTE — Telephone Encounter (Unsigned)
 Copied from CRM [id number]. Topic: Clinical - Medication Refill >> May 15, 2024  8:30 AM Isabell A wrote: Medication: Tiotropium Bromide  Monohydrate (SPIRIVA  RESPIMAT) 2.5 MCG/ACT AERS [[id number]]  Has the patient contacted their pharmacy? Yes (Agent: If no, request that the patient contact the pharmacy for the refill. If patient does not wish to contact the pharmacy document the reason why and proceed with request.) (Agent: If yes, when and what did the pharmacy advise?)  This is the patient's preferred pharmacy:  [facility] - [place], [place] - [address] [place] [address] Phone: [phone] Fax: [phone]  Is this the correct pharmacy for this prescription? Yes If no, delete pharmacy and type the correct one.   Has the prescription been filled recently? Yes  Is the patient out of the medication? Yes  Has the patient been seen for an appointment in the last year OR does the patient have an upcoming appointment? Yes  Can we respond through MyChart? No  Agent: Please be advised that Rx refills may take up to 3 business days. We ask that you follow-up with your pharmacy.

## 2024-05-15 NOTE — Telephone Encounter (Signed)
 Pt is requesting refill on Spiriva , I see you have never rx for pt and no mention in last OV is refill okay

## 2024-05-15 NOTE — Telephone Encounter (Signed)
 Is Spiriva  okay to refill I see no mention of it in her last two OV with you only mention is last OV with Sood?

## 2024-05-15 NOTE — Telephone Encounter (Signed)
 Last Fill: 07/03/23  Last OV: 04/22/24 Next OV: 08/22/24  Routing to provider for review/authorization.

## 2024-05-15 NOTE — Telephone Encounter (Signed)
 Copied from CRM [id number]. Topic: Clinical - Red Word Triage >> May 15, 2024 12:04 PM Eveleen Hinds B wrote: Red Word that prompted transfer to Nurse Triage: Persistent cough, hard to breath.    Chief Complaint: Chest tightness/Cough Symptoms: Productive cough with white phlegm Frequency: ------ Pertinent Negatives: Patient denies ----- Disposition: [] ED /[] Urgent Care (no appt availability in office) / [] Appointment(In office/virtual)/ []  [facility]/ [] Home Care/ [] Refused Recommended Disposition /[] [facility]/ []  Follow-up with PCP Additional Notes: Patient called and advised that she kept having prescriptions written for Spiriva  Respimat with Dr Matilde Son. Patient was calling about getting this refilled.   Call disconnected at 12:14pm  Attempted to call patient back and no answer--Left a voicemail at 12:19 pm

## 2024-05-15 NOTE — Telephone Encounter (Addendum)
 3rd attempt, LVM, routing to clinic for follow up.  2nd attempt, LVM

## 2024-05-16 ENCOUNTER — Encounter (HOSPITAL_COMMUNITY)

## 2024-05-23 ENCOUNTER — Telehealth: Payer: Self-pay | Admitting: Pulmonary Disease

## 2024-05-23 ENCOUNTER — Other Ambulatory Visit: Payer: Self-pay | Admitting: Pulmonary Disease

## 2024-05-23 MED ORDER — SPIRIVA RESPIMAT 2.5 MCG/ACT IN AERS: 2.0000 | INHALATION_SPRAY | Freq: Every day | RESPIRATORY_TRACT | 6 refills | Status: AC

## 2024-05-23 NOTE — Telephone Encounter (Signed)
 Rc'd copy of [facility] fax signed by Dr. Deanna Expose. Will fax to them @ [phone]

## 2024-06-05 NOTE — Telephone Encounter (Signed)
 NFN

## 2024-06-06 DIAGNOSIS — G4733 Obstructive sleep apnea (adult) (pediatric): Secondary | ICD-10-CM | POA: Diagnosis not present

## 2024-06-13 DIAGNOSIS — I10 Essential (primary) hypertension: Secondary | ICD-10-CM | POA: Diagnosis not present

## 2024-06-13 DIAGNOSIS — E1159 Type 2 diabetes mellitus with other circulatory complications: Secondary | ICD-10-CM | POA: Diagnosis not present

## 2024-06-13 DIAGNOSIS — J4530 Mild persistent asthma, uncomplicated: Secondary | ICD-10-CM | POA: Diagnosis not present

## 2024-06-13 DIAGNOSIS — E782 Mixed hyperlipidemia: Secondary | ICD-10-CM | POA: Diagnosis not present

## 2024-06-13 DIAGNOSIS — I2510 Atherosclerotic heart disease of native coronary artery without angina pectoris: Secondary | ICD-10-CM | POA: Diagnosis not present

## 2024-06-13 DIAGNOSIS — J449 Chronic obstructive pulmonary disease, unspecified: Secondary | ICD-10-CM | POA: Diagnosis not present

## 2024-06-13 DIAGNOSIS — F419 Anxiety disorder, unspecified: Secondary | ICD-10-CM | POA: Diagnosis not present

## 2024-06-13 DIAGNOSIS — Z6835 Body mass index (BMI) 35.0-35.9, adult: Secondary | ICD-10-CM | POA: Diagnosis not present

## 2024-06-13 DIAGNOSIS — M150 Primary generalized (osteo)arthritis: Secondary | ICD-10-CM | POA: Diagnosis not present

## 2024-06-13 DIAGNOSIS — R7303 Prediabetes: Secondary | ICD-10-CM | POA: Diagnosis not present

## 2024-06-13 DIAGNOSIS — E6609 Other obesity due to excess calories: Secondary | ICD-10-CM | POA: Diagnosis not present

## 2024-06-13 DIAGNOSIS — E785 Hyperlipidemia, unspecified: Secondary | ICD-10-CM | POA: Diagnosis not present

## 2024-06-17 ENCOUNTER — Telehealth: Payer: Self-pay | Admitting: Pulmonary Disease

## 2024-06-17 NOTE — Telephone Encounter (Signed)
 Rc'd [employer] fax for POC. Will fwd to Dr. MALVA for signature.

## 2024-07-02 DIAGNOSIS — E1140 Type 2 diabetes mellitus with diabetic neuropathy, unspecified: Secondary | ICD-10-CM | POA: Diagnosis not present

## 2024-07-02 DIAGNOSIS — E1151 Type 2 diabetes mellitus with diabetic peripheral angiopathy without gangrene: Secondary | ICD-10-CM | POA: Diagnosis not present

## 2024-07-03 ENCOUNTER — Other Ambulatory Visit: Payer: Self-pay | Admitting: Cardiology

## 2024-07-06 DIAGNOSIS — G4733 Obstructive sleep apnea (adult) (pediatric): Secondary | ICD-10-CM | POA: Diagnosis not present

## 2024-07-09 NOTE — Telephone Encounter (Signed)
 Copied from CRM [id number]. Topic: General - Other >> Jul 09, 2024 11:47 AM Rilla B wrote: Reason for CRM: [employer] called to check the status on the order from a few weeks ago (received 06/17/24)   FAX [phone]  Routing to Dr. Neda nurse, Consuelo to follow up on this request as she handles his paperwork.

## 2024-07-11 NOTE — Telephone Encounter (Signed)
 Order has been faxed back.07/05/2024

## 2024-07-15 NOTE — Telephone Encounter (Signed)
 Copied from CRM [id number]. Topic: Clinical - Order For Equipment >> May 30, 2024  1:33 PM Rilla B wrote: Reason for CRM: Patient called to say she has talked to [place] @ [employer] regarding her portable oxygen  machine. Patient wants office to look out for the paperwork. >> Jul 05, 2024  1:10 PM Russell PARAS wrote: Josette, with [employer] , is contacting clinic to follow up on POC order sent over on 06/24. Was advised the order would be sent to provider and would be signed once he returned from out of office. Reviewed chart and did not see any updates past 06/24 when order was sent to provider  Requests call back for status update  # [phone] >> Jun 05, 2024 10:44 AM Benton O wrote: christine from [employer]  is calling cause she  faxed over a request for chart notes and testing and to make sure no one had any questions and also wanted to make sure the office received that request. Ms christine will resend through alternate fax number  [phone] Ms christine direct line for any questions  Order has been faxed back to [employer] 07/05/2024.Nothing else further needed.

## 2024-07-29 ENCOUNTER — Telehealth: Payer: Self-pay | Admitting: Pharmacy Technician

## 2024-07-29 ENCOUNTER — Other Ambulatory Visit (HOSPITAL_COMMUNITY): Payer: Self-pay

## 2024-07-29 NOTE — Telephone Encounter (Signed)
 Pharmacy Patient Advocate Encounter  Received notification from [facility] that Prior Authorization for REpatha  has been APPROVED from 07/29/24 to 07/29/25. Ran test claim, Copay is $135.00- 3 months. This test claim was processed through [facility]- copay amounts may vary at other pharmacies due to pharmacy/plan contracts, or as the patient moves through the different stages of their insurance plan.   PA #/Case ID/Reference #: [id number]

## 2024-07-29 NOTE — Telephone Encounter (Signed)
 Pharmacy Patient Advocate Encounter   Received notification from CoverMyMeds that prior authorization for repatha  is required/requested.   Insurance verification completed.   The patient is insured through [employer]  .   Per test claim: PA required; PA submitted to above mentioned insurance via CoverMyMeds Key/confirmation #/EOC [id number] Status is pending

## 2024-08-06 DIAGNOSIS — G4733 Obstructive sleep apnea (adult) (pediatric): Secondary | ICD-10-CM | POA: Diagnosis not present

## 2024-08-13 ENCOUNTER — Other Ambulatory Visit: Payer: Self-pay | Admitting: Cardiology

## 2024-08-22 ENCOUNTER — Ambulatory Visit (INDEPENDENT_AMBULATORY_CARE_PROVIDER_SITE_OTHER): Admitting: Pulmonary Disease

## 2024-08-22 VITALS — BP 111/66 | HR 57 | Ht 60.0 in | Wt 173.0 lb

## 2024-08-22 DIAGNOSIS — U099 Post covid-19 condition, unspecified: Secondary | ICD-10-CM

## 2024-08-22 DIAGNOSIS — J8410 Pulmonary fibrosis, unspecified: Secondary | ICD-10-CM

## 2024-08-22 DIAGNOSIS — G4733 Obstructive sleep apnea (adult) (pediatric): Secondary | ICD-10-CM

## 2024-08-22 DIAGNOSIS — R0602 Shortness of breath: Secondary | ICD-10-CM

## 2024-08-22 DIAGNOSIS — Z87891 Personal history of nicotine dependence: Secondary | ICD-10-CM

## 2024-08-22 MED ORDER — ALBUTEROL SULFATE (2.5 MG/3ML) 0.083% IN NEBU: 2.5000 mg | INHALATION_SOLUTION | Freq: Four times a day (QID) | RESPIRATORY_TRACT | 5 refills | Status: AC | PRN

## 2024-08-22 MED ORDER — ARNUITY ELLIPTA 100 MCG/ACT IN AEPB: 1.0000 | INHALATION_SPRAY | Freq: Every day | RESPIRATORY_TRACT | 5 refills | Status: AC

## 2024-08-22 NOTE — Progress Notes (Unsigned)
 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, had a recurrence of infection in March 2025 This left her feeling more short of breath than usual  She does have an occasional cough, not really bringing up any secretions at present  Oxygen  use as needed  Tries to use her CPAP on a nightly basis  Has Been Working Relatively Well for Her  Reformed smoker  History of hypertension, hyperlipidemia, osteoarthritis, GERD, depression, anxiety, left bundle branch block, macular degeneration, type 2 diabetes, coronary artery disease   Outpatient Encounter Medications as of 08/22/2024  Medication Sig   albuterol  (PROVENTIL ) (2.5 MG/3ML) 0.083% nebulizer solution Take 3 mLs (2.5 mg total) by nebulization every 6 (six) hours as needed for wheezing or shortness of breath.   albuterol  (VENTOLIN  HFA) 108 (90 Base) MCG/ACT inhaler Inhale 2 puffs into the lungs every 6 (six) hours as needed for wheezing or shortness of breath.   ALPRAZolam  (XANAX ) 0.5 MG tablet Take 0.25 mg by mouth 2 (two) times daily.   Alum Hydroxide-Mag Trisilicate (GAVISCON) 80-14.2 MG CHEW Chew 2 tablets by mouth at bedtime.   amLODipine  (NORVASC ) 2.5 MG tablet TAKE ONE TABLET BY MOUTH ONCE DAILY.   Biotin w/ Vitamins C & E (HAIR/SKIN/NAILS PO) Take 1 tablet by mouth at bedtime.   bisoprolol -hydrochlorothiazide  (ZIAC ) 2.5-6.25 MG tablet TAKE ONE TABLET BY MOUTH EVERY DAY   busPIRone  (BUSPAR ) 15 MG tablet Take 15 mg by  mouth 2 (two) times daily.   Coenzyme Q10 (COQ10) 100 MG CAPS Take 100 mg by mouth every morning.   CRANBERRY PO Take 1 capsule by mouth daily.    escitalopram  (LEXAPRO ) 20 MG tablet Take 20 mg by mouth every morning.    esomeprazole (NEXIUM) 20 MG capsule Take 20 mg by mouth daily.   Evolocumab  (REPATHA  SURECLICK) 140 MG/ML SOAJ INJECT INTO THE SKIN ONCE EVERY 14 DAYS.   Fluticasone  Furoate (ARNUITY ELLIPTA ) 100 MCG/ACT AEPB Inhale 1 puff into the lungs daily in the afternoon.   furosemide  (LASIX ) 20 MG tablet TAKE ONE TABLET EVERY OTHER DAY.   isosorbide  mononitrate (IMDUR ) 60 MG 24 hr tablet TAKE ONE TABLET BY MOUTH ONCE DAILY.   mirtazapine  (REMERON ) 15 MG tablet Take 7.5 mg by mouth at bedtime.    Multiple Vitamin (MULTIVITAMIN) tablet Take 1 tablet by mouth at bedtime.    nitroGLYCERIN  (NITROSTAT ) 0.4 MG SL tablet PLACE 1 TAB UNDER TONGUE EVERY 5 MIN IF NEEDED FOR CHEST PAIN. MAY USE 3 TIMES.NO RELIEF CALL 911.   potassium chloride  SA (KLOR-CON  M) 20 MEQ tablet TAKE 1 TABLET EVERY OTHER DAY, TAKE ON DAYS THAT YOU TAKE FUROSEMIDE .   pravastatin  (PRAVACHOL ) 20 MG tablet Take 1 tablet (20 mg total) by mouth every evening.   Tiotropium Bromide  Monohydrate (SPIRIVA  RESPIMAT) 2.5 MCG/ACT AERS Inhale 2 puffs into the lungs daily in the afternoon.   valsartan  (DIOVAN ) 80 MG tablet Take 1 tablet (80 mg total) by mouth daily.  No facility-administered encounter medications on file as of 08/22/2024.    Allergies as of 08/22/2024 - Review Complete 04/22/2024  Allergen Reaction Noted   Adhesive [tape] Other (See Comments) 07/18/2012   Avelox [moxifloxacin] Anaphylaxis 01/27/2016   Bactrim [sulfamethoxazole-trimethoprim] Anaphylaxis, Shortness Of Breath, Rash, and Other (See Comments) 07/18/2012   Levaquin [levofloxacin] Anaphylaxis, Shortness Of Breath, Rash, and Other (See Comments) 03/31/2013   Mucinex  [guaifenesin  er] Anaphylaxis, Shortness Of Breath, and Rash 04/18/2014    Peanut-containing drug products Anaphylaxis 08/12/2020   Penicillins Other (See Comments) 07/18/2012   Sudafed [pseudoephedrine hcl] Shortness Of Breath, Rash, and Other (See Comments) 07/18/2012   Crestor [rosuvastatin] Other (See Comments) 04/15/2014   Lipitor [atorvastatin] Other (See Comments) 04/15/2014   Vytorin [ezetimibe-simvastatin] Other (See Comments) 04/15/2014   Fish allergy Nausea And Vomiting 04/14/2022   Fish oil Nausea And Vomiting 11/25/2020   Norvasc  [amlodipine ] Other (See Comments) 04/15/2014   Shrimp [shellfish allergy] Nausea And Vomiting 08/12/2020    Past Medical History:  Diagnosis Date   Anginal pain (HCC)    Anxiety    Anxiety disorder    With apparent panic attacks   Arthritis    hands, knees, back (09/18/2017)   CAD S/P percutaneous coronary angioplasty 08/2017   Single-vessel CAD involving second diagonal branch treated with DES stent Synergy 2.25 mm x 12 mm (2.4 mm)   Chronic back pain    all over (09/18/2017)   COPD (chronic obstructive pulmonary disease) (HCC)    PFT[facility] in [facility] at [facility]   Depression    Dyspnea    Dysrhythmia    LBBB   Essential hypertension    GERD (gastroesophageal reflux disease)    Hiatal hernia    History of left bundle branch block (LBBB) 08/2017   Rate Related LBBB noted in cath lab   Hyperlipidemia    Macular degeneration    right eye   Myocardial infarction [facility]) 2007   medically induced   Nonocclusive coronary atherosclerosis of native coronary artery 2006 through 2012   multi caths 2012, 2006, 2007 2009 (2007 complicated by catheter-induced dissection of small nondominant RCA, patent in 2009 with no residual abnormality); Myoview  August 2013: LOW RISK, normal EF. ; Echocardiogram [facility] - January 2014) moderate LVH, EF 55-65%. No significant valvular disease.   OSA (obstructive sleep apnea) 9/25/ 2012   tested 2009; tetested sleep study 07/2011--titration 09/20/2011 now use Bi-PAP   OSA on  CPAP    Pneumonia    several times in 2017; 3 times already this year (09/18/2017)   Scoliosis    Type II diabetes mellitus [facility])     Past Surgical History:  Procedure Laterality Date   BIOPSY  04/20/2022   Procedure: BIOPSY;  Surgeon: Golda Claudis PENNER, MD;  [facility]: [facility];  Service: Endoscopy;;   CARDIAC CATHETERIZATION  [id number]; 2009   Non-occlusice CAD - only 80% ostial SP1;  NON DOMINANNT  RCA (catheter insuced dissection with MI in 2007 --> resolved by 2009 cath)   CATARACT EXTRACTION, BILATERAL Bilateral 2017   Toric lens in the left eye only   COLONOSCOPY WITH PROPOFOL  N/A 11/09/2018   Procedure: COLONOSCOPY WITH PROPOFOL ;  Surgeon: Golda Claudis PENNER, MD;  Location: [facility];  Service: Endoscopy;  Laterality: N/A;  2:25   CORONARY ANGIOPLASTY     x 1 stent   CORONARY PRESSURE/FFR STUDY N/A 09/18/2017   Procedure: INTRAVASCULAR PRESSURE WIRE/FFR STUDY;  Surgeon: Anner Alm ORN, MD;  Location: [facility] INVASIVE CV LAB;  Service: Cardiovascular: FFR Diag 2 -- 0.78 (significcant) --> PCI   CORONARY STENT INTERVENTION N/A 09/18/2017   Procedure: CORONARY STENT INTERVENTION;  Surgeon: Anner Alm ORN, MD;  Location: [facility] INVASIVE CV LAB: DES PCI Diag2: Synergy DES 2.25 mm x 12 mm (2.4 mm)   DIAGNOSTIC LAPAROSCOPY Right    DILATION AND CURETTAGE OF UTERUS     ESOPHAGEAL DILATION N/A 04/20/2022   Procedure: ESOPHAGEAL DILATION;  Surgeon: Golda Claudis PENNER, MD;  [facility];  Service: Endoscopy;  Laterality: N/A;   ESOPHAGOGASTRODUODENOSCOPY (EGD) WITH PROPOFOL  N/A 04/20/2022   Procedure: ESOPHAGOGASTRODUODENOSCOPY (EGD) WITH PROPOFOL ;  Surgeon: Golda Claudis PENNER, MD;  Location: [facility];  Service: Endoscopy;  Laterality: N/A;  240   EYE SURGERY     KNEE ARTHROSCOPY Right    LEFT HEART CATH AND CORONARY ANGIOGRAPHY N/A 09/18/2017   Procedure: LEFT HEART CATH AND CORONARY ANGIOGRAPHY;  Surgeon: Anner Alm ORN, MD;  Location: [facility] INVASIVE CV LAB: Culpril - 80%  Diag2 (FFR 0.78)- PCI.  pLAD 40%, pCx 40%. Post PCI LBBB. LVEDP - ~20 mmHg. EF 55-60%   LIPOMA EXCISION Right ~ 2015   anterior abdomen   NM MYOVIEW  LTD  08/04/2021   LOW RISK. EF 55-60%. Mild fixed apical defect - /w breast attenuation.   PARS PLANA VITRECTOMY W/ REPAIR OF MACULAR HOLE Right    Unsuccessful repair.  Hole filled   POLYPECTOMY  11/09/2018   Procedure: POLYPECTOMY;  Surgeon: Golda Claudis PENNER, MD;  [facility];  Service: Endoscopy;;  colon   POLYPECTOMY  04/20/2022   Procedure: POLYPECTOMY;  Surgeon: Golda Claudis PENNER, MD;  [facility];  Service: Endoscopy;;  bx of polyps   TRANSTHORACIC ECHOCARDIOGRAM  08/04/2021   Normal LVEF 55-60%.  Septal movement consistent with LBBB now present.  Unable to assess diastolic function.  Normal RV size and function.  Unable assess PAP.  Normal aortic and mitral valves.  Normal RAP.   VAGINAL HYSTERECTOMY      Family History  Problem Relation Age of Onset   CAD Mother    Breast cancer Maternal Aunt    Breast cancer Paternal Aunt     Social History   Socioeconomic History   Marital status: Married    Spouse name: Not on file   Number of children: Not on file   Years of education: Not on file   Highest education level: Not on file  Occupational History   Not on file  Tobacco Use   Smoking status: Former    Current packs/day: 0.00    Average packs/day: 3.0 packs/day for 8.5 years (25.5 ttl pk-yrs)    Types: Cigarettes    Start date: 10/19/1967    Quit date: 04/18/1976    Years since quitting: 48.3    Passive exposure: Past   Smokeless tobacco: Never  Vaping Use   Vaping status: Never Used  Substance and Sexual Activity   Alcohol  use: No   Drug use: No   Sexual activity: Not Currently    Birth control/protection: None  Other Topics Concern   Not on file  Social History Narrative   Married mother of 4, grandmother of 7. Her mother is 13 years old. Quit smoking 34 years ago. Does not drink alcohol .   Retired from [employer] in 2012.   Usually presents with oldest daughter.   Previously worked out at [employer] regularly walking 1/2-1 mile a day, but no longer able to do so because of  the [employer] decision to no longer cover [employer] cost.      Was made DNI during her hospital stay for COVID-19 in January 2021, now that she has recovered from [facility] and doing well, she has rescinded this and is now full code.   Social Drivers of Corporate investment banker Strain: Low Risk  (11/29/2022)   Overall Financial Resource Strain (CARDIA)    Difficulty of Paying Living Expenses: Not hard at all  Food Insecurity: No Food Insecurity (11/29/2022)   Hunger Vital Sign    Worried About Running Out of Food in the Last Year: Never true    Ran Out of Food in the Last Year: Never true  Transportation Needs: No Transportation Needs (11/29/2022)   PRAPARE - Administrator, Civil Service (Medical): No    Lack of Transportation (Non-Medical): No  Physical Activity: Not on file  Stress: Not on file  Social Connections: Not on file  Intimate Partner Violence: Not on file    Review of Systems  Constitutional:  Negative for diaphoresis and fatigue.  Respiratory:  Positive for apnea, cough and shortness of breath.   Psychiatric/Behavioral:  Positive for sleep disturbance.     Vitals:   08/22/24 1004  BP: 111/66  Pulse: (!) 57  SpO2: 91%     Physical Exam Constitutional:      Appearance: She is obese.  HENT:     Head: Normocephalic.     Nose: Nose normal.     Mouth/Throat:     Mouth: Mucous membranes are moist.  Eyes:     General: No scleral icterus. Cardiovascular:     Rate and Rhythm: Normal rate and regular rhythm.     Heart sounds: No murmur heard.    No friction rub.  Pulmonary:     Effort: No respiratory distress.     Breath sounds: No stridor. No wheezing, rhonchi or rales.  Musculoskeletal:     Cervical back: No rigidity or tenderness.   Neurological:     Mental Status: She is alert.  Psychiatric:        Mood and Affect: Mood normal.    Data Reviewed: CT scan of the chest was reviewed by myself showing pulmonary fibrosis  PFT from 2022 compared with previous from 3 years ago showing stable findings  CPAP compliance reviewed showing excellent compliance with CPAP 94% Average use of 8 hours 4 minutes CPAP of 9 Residual AHI of 4.3  Most recent PFT shows stable findings compared to one performed 3 years ago  Assessment:  Obstructive sleep apnea -Encouraged to continue using CPAP nightly  Pulmonary fibrosis - Post-COVID fibrosis - Stable pulmonary function test  Deconditioning - Symptoms got worse following recent COVID infection as well - She knows to start graded activities as tolerated   Plan/Recommendations: Encourage graded activities as tolerated  Continue CPAP - Benefiting from CPAP - CPAP working well  Will schedule for PFT  Continue current inhalers including Anoro 80, Spiriva , albuterol  as needed  Follow-up in about 3 months  Did talk about vaccinations  Will talk with primary care doctor to make sure she has not received pneumococcal vaccine in the past and she will try and get vaccinated, also discussed the RSV vaccine   Encouraged to call with significant concerns  Jennet Epley MD [facility] 08/22/2024, 10:13 AM  CC: Marvine Rush, MD

## 2024-08-22 NOTE — Patient Instructions (Signed)
 We will schedule you for breathing study - This is to compare with your previous  Check with your primary doctor to make sure you have not had the pneumococcal vaccine, yes you should get the pneumococcal vaccine, the RSV vaccine is also advised  We will repeat a breathing study on you at next visit  Call us  with significant concerns  Continue graded activities as tolerated -Regular exercises will help  Continue Arnuity, Spiriva , albuterol  as needed  Follow-up in 3 months

## 2024-08-23 ENCOUNTER — Other Ambulatory Visit: Payer: Self-pay | Admitting: Cardiology

## 2024-08-29 ENCOUNTER — Other Ambulatory Visit: Payer: Self-pay | Admitting: Family Medicine

## 2024-08-29 DIAGNOSIS — Z1231 Encounter for screening mammogram for malignant neoplasm of breast: Secondary | ICD-10-CM

## 2024-09-06 ENCOUNTER — Telehealth (HOSPITAL_BASED_OUTPATIENT_CLINIC_OR_DEPARTMENT_OTHER): Payer: Self-pay

## 2024-09-06 NOTE — Telephone Encounter (Signed)
 Pt notified she can go to her local pharmacy and receive those vaccines and they are recommended. She does have an appt in December. NFN   Copied from CRM [id number]. Topic: Clinical - Medical Advice >> Sep 05, 2024 11:11 AM Leila C wrote: Reason for CRM: Patient is asking if she need Flu and RSV shot and make an appointment in [place]? Please advise and call back at [phone]. >> Sep 06, 2024  8:59 AM Isabell A wrote: Patient is calling for the same question - requesting a call back today.

## 2024-09-09 ENCOUNTER — Ambulatory Visit
Admission: RE | Admit: 2024-09-09 | Discharge: 2024-09-09 | Disposition: A | Discharge status: Home / Self Care | Source: Ambulatory Visit | Attending: Family Medicine | Admitting: Family Medicine

## 2024-09-09 DIAGNOSIS — Z1231 Encounter for screening mammogram for malignant neoplasm of breast: Secondary | ICD-10-CM

## 2024-09-10 DIAGNOSIS — E1140 Type 2 diabetes mellitus with diabetic neuropathy, unspecified: Secondary | ICD-10-CM | POA: Diagnosis not present

## 2024-09-10 DIAGNOSIS — E1151 Type 2 diabetes mellitus with diabetic peripheral angiopathy without gangrene: Secondary | ICD-10-CM | POA: Diagnosis not present

## 2024-09-17 ENCOUNTER — Encounter: Payer: Self-pay | Admitting: Cardiology

## 2024-09-17 ENCOUNTER — Ambulatory Visit: Attending: Cardiology | Admitting: Cardiology

## 2024-09-17 VITALS — BP 110/66 | HR 50 | Ht 60.0 in | Wt 171.0 lb

## 2024-09-17 DIAGNOSIS — E785 Hyperlipidemia, unspecified: Secondary | ICD-10-CM | POA: Diagnosis not present

## 2024-09-17 DIAGNOSIS — J9611 Chronic respiratory failure with hypoxia: Secondary | ICD-10-CM

## 2024-09-17 DIAGNOSIS — I21A9 Other myocardial infarction type: Secondary | ICD-10-CM | POA: Diagnosis not present

## 2024-09-17 DIAGNOSIS — Z955 Presence of coronary angioplasty implant and graft: Secondary | ICD-10-CM

## 2024-09-17 DIAGNOSIS — Z9981 Dependence on supplemental oxygen: Secondary | ICD-10-CM

## 2024-09-17 DIAGNOSIS — R7303 Prediabetes: Secondary | ICD-10-CM

## 2024-09-17 DIAGNOSIS — I25119 Atherosclerotic heart disease of native coronary artery with unspecified angina pectoris: Secondary | ICD-10-CM | POA: Diagnosis not present

## 2024-09-17 DIAGNOSIS — R600 Localized edema: Secondary | ICD-10-CM

## 2024-09-17 DIAGNOSIS — I10 Essential (primary) hypertension: Secondary | ICD-10-CM

## 2024-09-17 NOTE — Patient Instructions (Addendum)
 Medication Instructions:  No changes *If you need a refill on your cardiac medications before your next appointment, please call your pharmacy*  Lab Work: None ordered If you have labs (blood work) drawn today and your tests are completely normal, you will receive your results only by: MyChart Message (if you have MyChart) OR A paper copy in the mail If you have any lab test that is abnormal or we need to change your treatment, we will call you to review the results.  Testing/Procedures: None ordered  Follow-Up: At [facility], you and your health needs are our priority.  As part of our continuing mission to provide you with exceptional heart care, our providers are all part of one team.  This team includes your primary Cardiologist (physician) and Advanced Practice Providers or APPs (Physician Assistants and Nurse Practitioners) who all work together to provide you with the care you need, when you need it.  Your next appointment:   6 months with Damien Braver, NP  1 yr with Dr Anner  We recommend signing up for the patient portal called MyChart.  Sign up information is provided on this After Visit Summary.  MyChart is used to connect with patients for Virtual Visits (Telemedicine).  Patients are able to view lab/test results, encounter notes, upcoming appointments, etc.  Non-urgent messages can be sent to your provider as well.   To learn more about what you can do with MyChart, go to [email or web address].

## 2024-09-17 NOTE — Progress Notes (Signed)
 Cardiology Office Note:  .   Date:  09/24/2024  ID:  Vickie [name], DOB [birth year], MRN [id number] PCP: Marvine Rush, MD  [facility] Providers Cardiologist:  Alm Clay, MD Cardiology APP:  Daneen Damien BROCKS, NP     Chief Complaint  Patient presents with   Follow-up    Actually in good spirits.  Doing well.  No complaints.   Coronary Artery Disease    Single-vessel CAD with PCI and no further angina..    Patient Profile: .     Vickie [name] is a obese 78 y.o. female with a PMH noted Below who presents here for 6 month f/u.  PMH notable for CAD-PCI, HTN, HLD (completed Orion 8 Trial - now on Repatha ), COPD/OSA & Congenital Phocomeilia.      Vickie [name] was last seen on 03/27/24 by Damien Daneen, NP: stable from a cardiac standpoint.  She reports a couple of episodes of chest pressure/tightness, unchanged from prior visits.  Her symptoms are relieved with nitroglycerin .  She notes stable mild dyspnea on exertion, this has been ongoing since COVID-19 infection in 2020.  She has not been wearing her home oxygen  consistently as prescribed.  She denies palpitations, dizziness, presyncope, syncope, PND, orthopnea, weight gain. Overall, she reports feeling well. =>  Continue to monitor for progressive symptoms. Continue amlodipine , valsartan , Imdur , pravastatin , Repatha .   Subjective  Discussed the use of AI scribe software for clinical note transcription with the patient, who gave verbal consent to proceed.  History of Present Illness Vickie [name] is a 78 year old female with coronary artery disease and COPD who presents for follow-up after recent COVID-19 infection.  She has a history of coronary artery disease with a single vessel and diagonal PCI in 2018. She experiences occasional spasms and uses nitroglycerin  as needed. She frequently feels tired, attributing it to a deformed valve diagnosed in her twenties. She exercises using a bicycle but has reduced activity  due to overexertion. She experiences chest tightness when upset but denies chest pain during regular activity.  She has chronic obstructive pulmonary disease (COPD) and was previously on home oxygen , which she no longer uses regularly. She had a COVID-19 infection earlier this year and is scheduled for a pulmonary function test in December. She reports hair loss and occasional throbbing sensations at night and while sitting.  Her medication regimen includes Repatha  140 mg every two weeks, valsartan  40 mg daily, furosemide  20 mg twice weekly, amlodipine  2.5 mg daily, bisoprolol  HCTZ 2.5/6.25 mg daily, isosorbide  mononitrate 60 mg daily, and pravastatin  20 mg daily. She monitors her weight and adjusts her furosemide  intake based on weight changes.  She has a history of hypertension, hyperlipidemia, chronic bilateral lower extremity edema, hypothyroidism, and pre-diabetes. Recent lab results from June 2025 show a total cholesterol of 137 mg/dL, HDL of 50 mg/dL, LDL of 27 mg/dL, triglycerides of 790 mg/dL, and an J8r of 4.1%. Her TSH was 1.26. She is mindful of her diet, particularly avoiding starchy foods to manage her triglyceride levels.  No heart racing, skipping, passing out, dizziness, or shortness of breath when lying flat or waking up.     Objective   Current Meds Medication Sig   amLODipine  (NORVASC ) 2.5 MG tablet TAKE ONE TABLET BY MOUTH ONCE DAILY.   bisoprolol -hydrochlorothiazide  (ZIAC ) 2.5-6.25 MG tablet TAKE ONE TABLET BY MOUTH EVERY DAY   Coenzyme Q10 (COQ10) 100 MG CAPS Take 100 mg by mouth every morning.   Evolocumab  (REPATHA  SURECLICK) 140  MG/ML SOAJ INJECT INTO THE SKIN ONCE EVERY 14 DAYS.   furosemide  (LASIX ) 20 MG tablet Wed & Sat & PRN Sliding Scale   isosorbide  mononitrate (IMDUR ) 60 MG 24 hr tablet TAKE ONE TABLET BY MOUTH ONCE DAILY.   nitroGLYCERIN  (NITROSTAT ) 0.4 MG SL tablet PLACE 1 TAB UNDER TONGUE EVERY 5 MIN IF NEEDED FOR CHEST PAIN. MAY USE 3 TIMES.NO RELIEF CALL  911.   potassium chloride  SA (KLOR-CON  M) 20 MEQ tablet TAKE 1 TABLET EVERY OTHER DAY, TAKE ON DAYS THAT YOU TAKE FUROSEMIDE .   pravastatin  (PRAVACHOL ) 20 MG tablet Take 1 tablet (20 mg total) by mouth every evening.   valsartan  (DIOVAN ) 40 MG tablet TAKE ONE TABLET BY MOUTH ONCE DAILY.    Medication Sig   albuterol  (PROVENTIL ) (2.5 MG/3ML) 0.083% nebulizer solution Take 3 mLs (2.5 mg total) by nebulization every 6 (six) hours as needed for wheezing or shortness of breath.   albuterol  (VENTOLIN  HFA) 108 (90 Base) MCG/ACT inhaler Inhale 2 puffs into the lungs every 6 (six) hours as needed for wheezing or shortness of breath.   ALPRAZolam  (XANAX ) 0.5 MG tablet Take 0.25 mg by mouth 2 (two) times daily.   Alum Hydroxide-Mag Trisilicate (GAVISCON) 80-14.2 MG CHEW Chew 2 tablets by mouth at bedtime.   Biotin w/ Vitamins C & E (HAIR/SKIN/NAILS PO) Take 1 tablet by mouth at bedtime.   busPIRone  (BUSPAR ) 15 MG tablet Take 15 mg by mouth 2 (two) times daily.   CRANBERRY PO Take 1 capsule by mouth daily.    escitalopram  (LEXAPRO ) 20 MG tablet Take 20 mg by mouth every morning.    esomeprazole (NEXIUM) 20 MG capsule Take 20 mg by mouth daily.   Fluticasone  Furoate (ARNUITY ELLIPTA ) 100 MCG/ACT AEPB Inhale 1 puff into the lungs daily in the afternoon.   mirtazapine  (REMERON ) 15 MG tablet Take 7.5 mg by mouth at bedtime.    Multiple Vitamin (MULTIVITAMIN) tablet Take 1 tablet by mouth at bedtime.    Tiotropium Bromide  Monohydrate (SPIRIVA  RESPIMAT) 2.5 MCG/ACT AERS Inhale 2 puffs into the lungs daily in the afternoon.    Studies Reviewed: SABRA        Lab Results  Component Value Date   CHOL 95 (L) 09/05/2023   HDL 59 09/05/2023   LDLCALC 12 09/05/2023   TRIG 144 09/05/2023   CHOLHDL 1.6 09/05/2023   Results LABS Total cholesterol: 137 (06/13/2024) HDL: 50 (06/13/2024) LDL: 27 (06/13/2024) Triglycerides: 209 (06/13/2024) Hemoglobin: 15.1 (06/13/2024) Creatinine: 0.68  (06/13/2024) Thyroid -stimulating hormone (TSH): 1.26 (06/13/2024) Hemoglobin A1c: 5.8 (12/2023)  DIAGNOSTIC Echocardiogram:  (07/2021): EF 55-60%,NO RWMA. Septal bounce 2/2 LBBB. ~ mostly normal Valves. Normal RAP. (2022)   Myoview  Stress test: No ischemia or infarction.   (Fixed aical / anterior defect c/w breast attenuation)(07/2021)  Cath-PCI (09/19/2027): Culprit Lesion D2 80% (FFR 0.70 => DES PCI with Synergy XD 2.25 x 12 mm to 2.3 mm); proximal LAD 40% and proximal LCx 40%.  Normal LVEF 55 to 60%.  Rate related LBBB in the 70s.     Risk Assessment/Calculations:           Physical Exam:   VS:  BP 110/66   Pulse (!) 50   Ht 5' (1.524 m)   Wt 171 lb (77.6 kg)   SpO2 90%   BMI 33.40 kg/m    Wt Readings from Last 3 Encounters:  09/17/24 171 lb (77.6 kg)  08/22/24 173 lb (78.5 kg)  04/22/24 178 lb (80.7 kg)  GEN: Well nourished, well developed in no acute distress; mildly obese NECK: No JVD; No carotid bruits CARDIAC: Normal S1, S2; RRR, Soft 1/6 SEM @ RUSB (no radiation) no murmurs, rubs, gallops RESPIRATORY:  Clear to auscultation without rales, wheezing or rhonchi ; nonlabored, good air movement. ABDOMEN: Soft, non-tender, non-distended EXTREMITIES:  No edema; Phocomelia changes     ASSESSMENT AND PLAN: .    Problem List Items Addressed This Visit       Cardiology Problems   Coronary artery disease involving native coronary artery of native heart with angina pectoris - Primary (Chronic)   Single-vessel disease with diagonal PCI after FFR positive findings. Has been stable on Imdur  60 Daily, valsartan  40 mL daily, bisoprolol -HCTZ 2.5-6.25 mg daily along with a combination of pravastatin  20 mg daily and Repatha  140 mg every 2 weeks  No longer on antiplatelet agents.  Needs to be on aspirin  81 mg a day   Intermittent chest spasms managed with nitroglycerin . No recent heart studies this year. Last echocardiogram and stress test in 2022 were unremarkable. No  atrial fibrillation detected. - Continue isosorbide  mononitrate and nitroglycerin  as needed.      Essential hypertension (Chronic)   Blood pressure well controlled on current medication regimen. - Continue bisoprolol -HCTZ 2.5-6.2-daily along with valsartan  40 mg daily along with amlodipine  2.5 mg daily.  She is only using furosemide  every other day.      Hyperlipidemia with target low density lipoprotein (LDL) cholesterol less than 55 mg/dL (Chronic)   Cholesterol levels well controlled with Repatha  and pravastatin . Triglycerides slightly elevated at 209, likely due to dietary factors. - Continue Repatha  140 mg every two weeks and pravastatin  20 mg daily. - Advise dietary modifications to reduce triglycerides, focusing on reducing starchy foods.      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  Type IVa.  This does not count when he comes to preop assessments.  Trivial troponin elevation with no echocardiographic evidence of wall motion normality.        Other   Bilateral lower extremity edema (Chronic)   Edema managed with furosemide  taken twice weekly or as needed based on weight changes. - Continue furosemide  20 mg on Wednesdays and Saturdays or as needed based on weight gain. - Monitor weight regularly and adjust furosemide  intake accordingly.      Chronic hypoxic respiratory failure, on home oxygen  therapy (HCC) (Chronic)   No recent exacerbations. Manages well without significant respiratory distress. Pulmonary function test scheduled for December. - Continue current management and exercise regimen. - Follow up with pulmonary function test in December.      Prediabetes   A1c was 5.8 in January. She acknowledges dietary indulgence recently but is aware of the need for dietary control. - Continue monitoring A1c levels. - Advise on  maintaining a balanced diet to manage blood sugar levels.      Status post coronary artery stent placement (Chronic)   I think she is still taking aspirin , just need to confirm.  Okay to hold 5 to 7 days preop for surgeries or procedures.             Follow-Up: Return in about 6 months (around 03/17/2025) for Alternate 6 month follow-up with APP & MD.    Signed, Alm MICAEL Clay, MD, MS Alm Clay, M.D., M.S. Interventional Cardiologist  [facility] Pager # [phone]

## 2024-09-24 ENCOUNTER — Encounter: Payer: Self-pay | Admitting: Cardiology

## 2024-09-24 DIAGNOSIS — R7303 Prediabetes: Secondary | ICD-10-CM | POA: Insufficient documentation

## 2024-09-24 NOTE — Assessment & Plan Note (Signed)
 Blood pressure well controlled on current medication regimen. - Continue bisoprolol -HCTZ 2.5-6.2-daily along with valsartan  40 mg daily along with amlodipine  2.5 mg daily.  She is only using furosemide  every other day.

## 2024-09-24 NOTE — Assessment & Plan Note (Signed)
 No recent exacerbations. Manages well without significant respiratory distress. Pulmonary function test scheduled for December. - Continue current management and exercise regimen. - Follow up with pulmonary function test in December.

## 2024-09-24 NOTE — Assessment & Plan Note (Signed)
 A1c was 5.8 in January. She acknowledges dietary indulgence recently but is aware of the need for dietary control. - Continue monitoring A1c levels. - Advise on maintaining a balanced diet to manage blood sugar levels.

## 2024-09-24 NOTE — Assessment & Plan Note (Signed)
 Single-vessel disease with diagonal PCI after FFR positive findings. Has been stable on Imdur  60 Daily, valsartan  40 mL daily, bisoprolol -HCTZ 2.5-6.25 mg daily along with a combination of pravastatin  20 mg daily and Repatha  140 mg every 2 weeks  No longer on antiplatelet agents.  Needs to be on aspirin  81 mg a day   Intermittent chest spasms managed with nitroglycerin . No recent heart studies this year. Last echocardiogram and stress test in 2022 were unremarkable. No atrial fibrillation detected. - Continue isosorbide  mononitrate and nitroglycerin  as needed.

## 2024-09-24 NOTE — Assessment & Plan Note (Signed)
 Edema managed with furosemide  taken twice weekly or as needed based on weight changes. - Continue furosemide  20 mg on Wednesdays and Saturdays or as needed based on weight gain. - Monitor weight regularly and adjust furosemide  intake accordingly.

## 2024-09-24 NOTE — Assessment & Plan Note (Signed)
 Cholesterol levels well controlled with Repatha  and pravastatin . Triglycerides slightly elevated at 209, likely due to dietary factors. - Continue Repatha  140 mg every two weeks and pravastatin  20 mg daily. - Advise dietary modifications to reduce triglycerides, focusing on reducing starchy foods.

## 2024-09-24 NOTE — Assessment & Plan Note (Signed)
 I think she is still taking aspirin , just need to confirm.  Okay to hold 5 to 7 days preop for surgeries or procedures.

## 2024-09-24 NOTE — Assessment & Plan Note (Signed)
 Distant history type IV MI in the setting of RCA dissection due to catheter induced iatrogenic injury.  Type IVa.  This does not count when he comes to preop assessments.  Trivial troponin elevation with no echocardiographic evidence of wall motion normality.

## 2024-10-01 ENCOUNTER — Other Ambulatory Visit: Payer: Self-pay | Admitting: Cardiology

## 2024-10-09 ENCOUNTER — Encounter (INDEPENDENT_AMBULATORY_CARE_PROVIDER_SITE_OTHER): Payer: Self-pay | Admitting: Gastroenterology

## 2024-11-27 ENCOUNTER — Encounter (HOSPITAL_COMMUNITY): Payer: Self-pay

## 2024-11-27 ENCOUNTER — Other Ambulatory Visit: Payer: Self-pay

## 2024-11-27 ENCOUNTER — Emergency Department (HOSPITAL_COMMUNITY)
Admission: EM | Admit: 2024-11-27 | Discharge: 2024-11-27 | Disposition: A | Discharge status: Home / Self Care | Attending: Emergency Medicine | Admitting: Emergency Medicine

## 2024-11-27 ENCOUNTER — Emergency Department (HOSPITAL_COMMUNITY)

## 2024-11-27 DIAGNOSIS — J441 Chronic obstructive pulmonary disease with (acute) exacerbation: Secondary | ICD-10-CM

## 2024-11-27 LAB — URINALYSIS, ROUTINE W REFLEX MICROSCOPIC
Bacteria, UA: NONE SEEN
Bilirubin Urine: NEGATIVE
Glucose, UA: NEGATIVE mg/dL
Hgb urine dipstick: NEGATIVE
Ketones, ur: NEGATIVE mg/dL
Nitrite: NEGATIVE
Protein, ur: NEGATIVE mg/dL
Specific Gravity, Urine: 1.01 (ref 1.005–1.030)
pH: 7 (ref 5.0–8.0)

## 2024-11-27 LAB — CBC
HCT: 48 % — ABNORMAL HIGH (ref 36.0–46.0)
Hemoglobin: 16 g/dL — ABNORMAL HIGH (ref 12.0–15.0)
MCH: 30 pg (ref 26.0–34.0)
MCHC: 33.3 g/dL (ref 30.0–36.0)
MCV: 89.9 fL (ref 80.0–100.0)
Platelets: 179 K/uL (ref 150–400)
RBC: 5.34 MIL/uL — ABNORMAL HIGH (ref 3.87–5.11)
RDW: 12.3 % (ref 11.5–15.5)
WBC: 13.8 K/uL — ABNORMAL HIGH (ref 4.0–10.5)
nRBC: 0 % (ref 0.0–0.2)

## 2024-11-27 LAB — RESP PANEL BY RT-PCR (RSV, FLU A&B, COVID)  RVPGX2
Influenza A by PCR: NEGATIVE
Influenza B by PCR: NEGATIVE
Resp Syncytial Virus by PCR: NEGATIVE
SARS Coronavirus 2 by RT PCR: NEGATIVE

## 2024-11-27 LAB — BASIC METABOLIC PANEL WITH GFR
Anion gap: 10 (ref 5–15)
BUN: 7 mg/dL — ABNORMAL LOW (ref 8–23)
CO2: 30 mmol/L (ref 22–32)
Calcium: 9.9 mg/dL (ref 8.9–10.3)
Chloride: 101 mmol/L (ref 98–111)
Creatinine, Ser: 0.72 mg/dL (ref 0.44–1.00)
GFR, Estimated: >60 mL/min (ref >60)
Glucose, Bld: 107 mg/dL — ABNORMAL HIGH (ref 70–99)
Potassium: 3.7 mmol/L (ref 3.5–5.1)
Sodium: 140 mmol/L (ref 135–145)

## 2024-11-27 LAB — TROPONIN T, HIGH SENSITIVITY
Troponin T High Sensitivity: <15 ng/L (ref 0–19)
Troponin T High Sensitivity: <15 ng/L (ref 0–19)

## 2024-11-27 LAB — GROUP A STREP BY PCR: Group A Strep by PCR: NOT DETECTED

## 2024-11-27 LAB — PRO BRAIN NATRIURETIC PEPTIDE: Pro Brain Natriuretic Peptide: 202 pg/mL (ref <300.0)

## 2024-11-27 MED ORDER — IPRATROPIUM-ALBUTEROL 0.5-2.5 (3) MG/3ML IN SOLN
3.0000 mL | Freq: Once | RESPIRATORY_TRACT | Status: AC
  Administered 2024-11-27: 3 mL via RESPIRATORY_TRACT
  Filled 2024-11-27: qty 3

## 2024-11-27 MED ORDER — BENZONATATE 200 MG PO CAPS: 200.0000 mg | ORAL_CAPSULE | Freq: Three times a day (TID) | ORAL | 0 refills | Status: DC | PRN

## 2024-11-27 MED ORDER — PREDNISONE 20 MG PO TABS
40.0000 mg | ORAL_TABLET | Freq: Once | ORAL | Status: AC
  Administered 2024-11-27: 40 mg via ORAL
  Filled 2024-11-27: qty 2

## 2024-11-27 MED ORDER — PREDNISONE 20 MG PO TABS: 40.0000 mg | ORAL_TABLET | Freq: Every day | ORAL | 0 refills | Status: DC

## 2024-11-27 MED ORDER — SODIUM CHLORIDE 0.9 % IV BOLUS
500.0000 mL | Freq: Once | INTRAVENOUS | Status: AC
  Administered 2024-11-27: 500 mL via INTRAVENOUS

## 2024-11-27 MED ORDER — DOXYCYCLINE HYCLATE 100 MG PO CAPS: 100.0000 mg | ORAL_CAPSULE | Freq: Two times a day (BID) | ORAL | 0 refills | Status: AC

## 2024-11-27 MED ORDER — DOXYCYCLINE HYCLATE 100 MG PO TABS
100.0000 mg | ORAL_TABLET | Freq: Once | ORAL | Status: AC
  Administered 2024-11-27: 100 mg via ORAL
  Filled 2024-11-27: qty 1

## 2024-11-27 NOTE — ED Provider Notes (Signed)
 [place] EMERGENCY DEPARTMENT AT [facility] Provider Note   CSN: [id number] Arrival date & time: 11/27/24  9056     Patient presents with: Shortness of Breath   Vickie [name] is a 78 y.o. female.    Shortness of Breath     Vickie [name] is a 78 y.o. female past medical history of COPD, anxiety, GERD, diabetes, coronary artery disease, hypertension (has home O2 uses as needed and mostly at night ) who presents to the Emergency Department complaining of cough, congestion, and shortness of breath.  Symptoms began Monday.  Been taking over-the-counter Mucinex  with minimal relief.  She feels as though she has been rattling in her chest.  She is here due to concern for possible pneumonia.  She denies any chest pain, fever or chills.  Denies any swelling of her lower extremities. She denies increased use of her oxygen  or pain in her chest  Prior to Admission medications   Medication Sig Start Date End Date Taking? Authorizing Provider  albuterol  (PROVENTIL ) (2.5 MG/3ML) 0.083% nebulizer solution Take 3 mLs (2.5 mg total) by nebulization every 6 (six) hours as needed for wheezing or shortness of breath. 08/22/24  Yes Olalere, Adewale A, MD  albuterol  (VENTOLIN  HFA) 108 (90 Base) MCG/ACT inhaler Inhale 2 puffs into the lungs every 6 (six) hours as needed for wheezing or shortness of breath. 09/29/22  Yes Sood, Vineet, MD  ALPRAZolam  (XANAX ) 0.5 MG tablet Take 0.25 mg by mouth 2 (two) times daily.   Yes [provider]  Alum Hydroxide-Mag Trisilicate (GAVISCON) 80-14.2 MG CHEW Chew 2 tablets by mouth at bedtime.   Yes [provider]  amLODipine  (NORVASC ) 2.5 MG tablet TAKE ONE TABLET BY MOUTH ONCE DAILY. 04/18/24  Yes Anner Alm ORN, MD  Biotin w/ Vitamins C & E (HAIR/SKIN/NAILS PO) Take 1 tablet by mouth at bedtime.   Yes [provider]  bisoprolol -hydrochlorothiazide  (ZIAC ) 2.5-6.25 MG tablet TAKE ONE TABLET BY MOUTH EVERY DAY 08/13/24  Yes  Anner Alm ORN, MD  busPIRone  (BUSPAR ) 15 MG tablet Take 15 mg by mouth 2 (two) times daily.   Yes [provider]  Coenzyme Q10 (COQ10) 100 MG CAPS Take 100 mg by mouth every morning.   Yes [provider]  CRANBERRY PO Take 1 capsule by mouth daily.    Yes [provider]  escitalopram  (LEXAPRO ) 20 MG tablet Take 20 mg by mouth every morning.    Yes [provider]  esomeprazole (NEXIUM) 20 MG capsule Take 20 mg by mouth daily.   Yes [provider]  Evolocumab  (REPATHA  SURECLICK) 140 MG/ML SOAJ INJECT INTO THE SKIN ONCE EVERY 14 DAYS. 04/15/24  Yes Jordan, Peter M, MD  Fluticasone  Furoate (ARNUITY ELLIPTA ) 100 MCG/ACT AEPB Inhale 1 puff into the lungs daily in the afternoon. 08/22/24  Yes Olalere, Adewale A, MD  furosemide  (LASIX ) 20 MG tablet TAKE ONE TABLET EVERY OTHER DAY. 04/18/24  Yes Anner Alm ORN, MD  isosorbide  mononitrate (IMDUR ) 60 MG 24 hr tablet TAKE ONE TABLET BY MOUTH ONCE DAILY. 08/23/24  Yes Monge, Damien BROCKS, NP  mirtazapine  (REMERON ) 15 MG tablet Take 7.5 mg by mouth at bedtime.    Yes [provider]  Multiple Vitamin (MULTIVITAMIN) tablet Take 1 tablet by mouth daily.   Yes [provider]  nitroGLYCERIN  (NITROSTAT ) 0.4 MG SL tablet PLACE 1 TAB UNDER TONGUE EVERY 5 MIN IF NEEDED FOR CHEST PAIN. MAY USE 3 TIMES.NO RELIEF CALL 911. 07/18/23  Yes  Anner Alm ORN, MD  potassium chloride  SA (KLOR-CON  M) 20 MEQ tablet TAKE 1 TABLET EVERY OTHER DAY, TAKE ON DAYS THAT YOU TAKE FUROSEMIDE . 07/03/24  Yes Anner Alm ORN, MD  pravastatin  (PRAVACHOL ) 20 MG tablet Take 1 tablet (20 mg total) by mouth every evening. 10/02/24  Yes Anner Alm ORN, MD  Tiotropium Bromide  Monohydrate (SPIRIVA  RESPIMAT) 2.5 MCG/ACT AERS Inhale 2 puffs into the lungs daily in the afternoon. 05/23/24  Yes Olalere, Adewale A, MD  valsartan  (DIOVAN ) 40 MG tablet TAKE ONE TABLET BY MOUTH ONCE DAILY. 08/23/24  Yes Monge, Damien BROCKS, NP    Allergies:  Adhesive [tape], Avelox [moxifloxacin], Bactrim [sulfamethoxazole-trimethoprim], Levaquin [levofloxacin], Mucinex  [guaifenesin  er], Peanut-containing drug products, Penicillins, Sudafed [pseudoephedrine hcl], Crestor [rosuvastatin], Lipitor [atorvastatin], Vytorin [ezetimibe-simvastatin], Fish allergy, Fish oil, Norvasc  [amlodipine ], and Shrimp [shellfish allergy]    Review of Systems  Respiratory:  Positive for shortness of breath.     Updated Vital Signs BP (!) 98/55   Pulse (!) 58   Temp 98.3 F (36.8 C) (Axillary)   Resp 16   Ht 5' (1.524 m)   Wt 77.6 kg   SpO2 96%   BMI 33.41 kg/m   Physical Exam Vitals and nursing note reviewed.  Constitutional:      General: She is not in acute distress.    Appearance: Normal appearance. She is well-developed. She is not toxic-appearing.  Cardiovascular:     Rate and Rhythm: Normal rate and regular rhythm.     Pulses: Normal pulses.  Pulmonary:     Effort: Pulmonary effort is normal.     Breath sounds: Rhonchi present.     Comments: Course lung sounds bilaterally.  No increased work of breathing Abdominal:     Palpations: Abdomen is soft.     Tenderness: There is no abdominal tenderness.  Musculoskeletal:     Cervical back: Normal range of motion.  Skin:    General: Skin is warm.     Capillary Refill: Capillary refill takes less than 2 seconds.     Findings: No rash.  Neurological:     General: No focal deficit present.     Mental Status: She is alert.     Sensory: No sensory deficit.     Motor: No weakness.     (all labs ordered are listed, but only abnormal results are displayed) Labs Reviewed  BASIC METABOLIC PANEL WITH GFR - Abnormal; Notable for the following components:      Result Value   Glucose, Bld 107 (*)    BUN 7 (*)    All other components within normal limits  CBC - Abnormal; Notable for the following components:   WBC 13.8 (*)    RBC 5.34 (*)    Hemoglobin 16.0 (*)    HCT 48.0 (*)    All other  components within normal limits  URINALYSIS, ROUTINE W REFLEX MICROSCOPIC - Abnormal; Notable for the following components:   Leukocytes,Ua SMALL (*)    All other components within normal limits  RESP PANEL BY RT-PCR (RSV, FLU A&B, COVID)  [id number]  GROUP A STREP BY PCR  PRO BRAIN NATRIURETIC PEPTIDE  TROPONIN T, HIGH SENSITIVITY  TROPONIN T, HIGH SENSITIVITY    EKG: EKG Interpretation Date/Time:  Wednesday November 27 2024 10:12:17 EST Ventricular Rate:  61 PR Interval:  187 QRS Duration:  122 QT Interval:  452 QTC Calculation: 456 R Axis:   -17  Text Interpretation: Sinus rhythm Left bundle branch block Baseline wander in lead(s) V6 Confirmed  by Yolande Charleston [phone]) on 11/27/2024 12:13:17 PM  Radiology: DG Chest 2 View Result Date: 11/27/2024 CLINICAL DATA:  Shortness of breath. EXAM: CHEST - 2 VIEW COMPARISON:  03/25/2024 FINDINGS: The heart size and mediastinal contours are within normal limits. Stable advanced emphysematous lung disease and bilateral pulmonary scarring. There is no evidence of pulmonary edema, consolidation, pneumothorax, nodule or pleural fluid. The visualized skeletal structures are unremarkable. IMPRESSION: Stable advanced emphysematous lung disease and bilateral pulmonary scarring. No acute findings. Electronically Signed   By: Marcey Moan M.D.   On: 11/27/2024 11:08     Procedures   Medications Ordered in the ED  ipratropium-albuterol  (DUONEB) 0.5-2.5 (3) MG/3ML nebulizer solution 3 mL (3 mLs Nebulization Given 11/27/24 1127)  sodium chloride  0.9 % bolus 500 mL (500 mLs Intravenous New Bag/Given 11/27/24 1316)                                    Medical Decision Making   Patient here from home for evaluation of shortness of breath, cough and congestion.  Symptoms present for 2 days.  No fever or chills.  Feels as though she has been rattling in her chest and she is concerned about possible pneumonia.  She has history of COPD and uses oxygen  at  home as needed.  She denies any increased use of her oxygen  for baseline and peripheral edema  Amount and/or Complexity of Data Reviewed Labs: ordered.    Details: Mild leukocytosis, chemistries without derangement.  Respiratory panel negative, she has reassuring BNP and urinalysis without evidence of infection.  Delta troponins reassuring Radiology: ordered.    Details: Chest x-ray shows emphysematous changes without acute finding Discussion of management or test interpretation with external provider(s):  Was notified by nursing that blood pressure was dropping.  she was 89 systolic.  Patient denies any symptoms currently.  BP cuff was repositioned and some IV fluids were administered.  She states that she has had low blood pressure in the past only drink coffee for breakfast this morning.  On recheck, she is now 103 systolic  On recheck, patient feeling better.  She had small amount of IV fluids.  Her systolic pressures 118 now.  She does have oxygen  at home.  Her symptoms are felt to be secondary to COPD exacerbation.  Her lung sounds improved after neb here.  She has albuterol  neb at home along with O2.  I have offered hospital admission but she declined stating that she prefers out pt treatment and discharge home.  She will follow-up closely outpatient with her PCP and she was given strict return precautions as well.  Risk Prescription drug management.        Final diagnoses:  COPD exacerbation [facility])    ED Discharge Orders     None          Herlinda Milling, PA-C 11/30/24 1327    Yolande Charleston BROCKS, MD 12/03/24 [phone]

## 2024-11-27 NOTE — ED Triage Notes (Signed)
 Pt arrived via POV from home c/o SOB and a cough since Monday. Pt reports taking Mucinex  at home with minimal relief. Pt verbalizes concern for possible pneumonia. Pt reports she wears O2 at night.

## 2024-11-27 NOTE — Discharge Instructions (Signed)
 Please take the medications as directed.  As we discussed, use your oxygen  as needed and use your albuterol  nebulizer, 1 treatment every 4-6 hours for the next few days while you are sick.  You may return back to as needed when you are feeling better.  Please call your primary care provider to make a follow-up appointment for later this week or early next week.  Return to the emergency department if you develop any new or worsening symptoms.

## 2024-11-30 ENCOUNTER — Other Ambulatory Visit: Payer: Self-pay | Admitting: Cardiology

## 2024-11-30 DIAGNOSIS — I25119 Atherosclerotic heart disease of native coronary artery with unspecified angina pectoris: Secondary | ICD-10-CM

## 2024-11-30 DIAGNOSIS — E785 Hyperlipidemia, unspecified: Secondary | ICD-10-CM

## 2024-12-05 ENCOUNTER — Encounter: Payer: Self-pay | Admitting: Pulmonary Disease

## 2024-12-05 ENCOUNTER — Ambulatory Visit: Admitting: Pulmonary Disease

## 2024-12-05 ENCOUNTER — Ambulatory Visit

## 2024-12-05 VITALS — BP 121/60 | HR 79 | Ht 60.0 in | Wt 166.0 lb

## 2024-12-05 DIAGNOSIS — G4733 Obstructive sleep apnea (adult) (pediatric): Secondary | ICD-10-CM

## 2024-12-05 DIAGNOSIS — J209 Acute bronchitis, unspecified: Secondary | ICD-10-CM

## 2024-12-05 DIAGNOSIS — J8410 Pulmonary fibrosis, unspecified: Secondary | ICD-10-CM

## 2024-12-05 DIAGNOSIS — J849 Interstitial pulmonary disease, unspecified: Secondary | ICD-10-CM

## 2024-12-05 DIAGNOSIS — R0602 Shortness of breath: Secondary | ICD-10-CM

## 2024-12-05 DIAGNOSIS — Z87891 Personal history of nicotine dependence: Secondary | ICD-10-CM

## 2024-12-05 LAB — PULMONARY FUNCTION TEST
DL/VA % pred: 113 %
DL/VA: 4.81 ml/min/mmHg/L
DLCO cor % pred: 99 %
DLCO cor: 15.88 ml/min/mmHg
DLCO unc % pred: 106 %
DLCO unc: 17.02 ml/min/mmHg
FEF 25-75 Post: 2 L/s
FEF 25-75 Pre: 1.38 L/s
FEF2575-%Change-Post: 45 %
FEF2575-%Pred-Post: 155 %
FEF2575-%Pred-Pre: 107 %
FEV1-%Change-Post: 10 %
FEV1-%Pred-Post: 103 %
FEV1-%Pred-Pre: 93 %
FEV1-Post: 1.64 L
FEV1-Pre: 1.49 L
FEV1FVC-%Change-Post: 5 %
FEV1FVC-%Pred-Pre: 104 %
FEV6-%Change-Post: 4 %
FEV6-%Pred-Post: 99 %
FEV6-%Pred-Pre: 94 %
FEV6-Post: 2 L
FEV6-Pre: 1.92 L
FEV6FVC-%Change-Post: 0 %
FEV6FVC-%Pred-Post: 106 %
FEV6FVC-%Pred-Pre: 105 %
FVC-%Change-Post: 4 %
FVC-%Pred-Post: 93 %
FVC-%Pred-Pre: 89 %
FVC-Post: 2 L
FVC-Pre: 1.92 L
Post FEV1/FVC ratio: 82 %
Post FEV6/FVC ratio: 100 %
Pre FEV1/FVC ratio: 78 %
Pre FEV6/FVC Ratio: 100 %
RV % pred: 90 %
RV: 1.87 L
TLC % pred: 89 %
TLC: 3.87 L

## 2024-12-05 NOTE — Progress Notes (Signed)
 Full PFT performed today.

## 2024-12-05 NOTE — Patient Instructions (Signed)
 Your breathing study looks improved compared to 1 several months ago and also compared to one performed 3 years ago  Make sure you could keep focusing on graded exercises as tolerated  Continue using your CPAP on a nightly basis  Follow-up in 6 months  Call us  with significant concerns

## 2024-12-05 NOTE — Patient Instructions (Signed)
 Full PFT performed today.

## 2024-12-05 NOTE — Progress Notes (Signed)
 Vickie [name]    [id number]    [birth year]  Primary Care Physician:Golding, Norleen, MD  Referring Physician: Marvine Norleen, MD [address] [place],  [place] [address]  Chief complaint:   Follow-up for shortness of breath, interstitial lung disease Develop scarring in her lungs post-COVID which she has had about 3 times  Discussed the use of AI scribe software for clinical note transcription with the patient, who gave verbal consent to proceed.  History of Present Illness Vickie [name] is a 78 year old female with sleep apnea and lung scarring who presents with breathing difficulties and CPAP intolerance.  She experiences breathing difficulties, particularly during activities such as walking to the kitchen or standing while cooking. Her symptoms worsened after an illness last week, suspected to be pneumonia, characterized by audible wheezing and difficulty breathing. She visited the emergency room and was treated with prednisone  and doxycycline , which improved her symptoms. She continues to use breathing treatments.  She has long-standing issues with her CPAP machine, finding it irritating and difficult to use. She uses a fan at night to help with airflow and sometimes wakes up feeling like she cannot breathe properly.  She has a history of lung scarring attributed to previous COVID-19 infections. She experiences increased breathlessness with additional respiratory infections, such as bronchitis, which she recently had.  She has been trying to maintain an exercise routine, initially doing well with consistent activity but had to stop due to illness and the Thanksgiving holiday. She plans to resume her exercise regimen, which previously made her feel better and look smaller, despite no significant weight loss.  No recent fever or significant weight loss.   Generally feeling well Tries to stay active  She is accompanied by family member today  Outpatient Encounter  Medications as of 12/05/2024  Medication Sig   albuterol  (PROVENTIL ) (2.5 MG/3ML) 0.083% nebulizer solution Take 3 mLs (2.5 mg total) by nebulization every 6 (six) hours as needed for wheezing or shortness of breath.   albuterol  (VENTOLIN  HFA) 108 (90 Base) MCG/ACT inhaler Inhale 2 puffs into the lungs every 6 (six) hours as needed for wheezing or shortness of breath.   ALPRAZolam  (XANAX ) 0.5 MG tablet Take 0.25 mg by mouth 2 (two) times daily.   Alum Hydroxide-Mag Trisilicate (GAVISCON) 80-14.2 MG CHEW Chew 2 tablets by mouth at bedtime.   Biotin w/ Vitamins C & E (HAIR/SKIN/NAILS PO) Take 1 tablet by mouth at bedtime.   bisoprolol -hydrochlorothiazide  (ZIAC ) 2.5-6.25 MG tablet TAKE ONE TABLET BY MOUTH EVERY DAY   busPIRone  (BUSPAR ) 15 MG tablet Take 15 mg by mouth 2 (two) times daily.   Coenzyme Q10 (COQ10) 100 MG CAPS Take 100 mg by mouth every morning.   CRANBERRY PO Take 1 capsule by mouth daily.    doxycycline  (VIBRAMYCIN ) 100 MG capsule Take 1 capsule (100 mg total) by mouth 2 (two) times daily.   escitalopram  (LEXAPRO ) 20 MG tablet Take 20 mg by mouth every morning.    esomeprazole (NEXIUM) 20 MG capsule Take 20 mg by mouth daily.   Fluticasone  Furoate (ARNUITY ELLIPTA ) 100 MCG/ACT AEPB Inhale 1 puff into the lungs daily in the afternoon.   furosemide  (LASIX ) 20 MG tablet TAKE ONE TABLET EVERY OTHER DAY.   isosorbide  mononitrate (IMDUR ) 60 MG 24 hr tablet TAKE ONE TABLET BY MOUTH ONCE DAILY.   mirtazapine  (REMERON ) 15 MG tablet Take 7.5 mg by mouth at bedtime.    Multiple Vitamin (MULTIVITAMIN)  tablet Take 1 tablet by mouth daily.   nitroGLYCERIN  (NITROSTAT ) 0.4 MG SL tablet PLACE 1 TAB UNDER TONGUE EVERY 5 MIN IF NEEDED FOR CHEST PAIN. MAY USE 3 TIMES.NO RELIEF CALL 911.   potassium chloride  SA (KLOR-CON  M) 20 MEQ tablet TAKE 1 TABLET EVERY OTHER DAY, TAKE ON DAYS THAT YOU TAKE FUROSEMIDE .   pravastatin  (PRAVACHOL ) 20 MG tablet Take 1 tablet (20 mg total) by mouth every evening.    REPATHA  SURECLICK 140 MG/ML SOAJ INJECT INTO THE SKIN ONCE EVERY 14 DAYS.   Tiotropium Bromide  Monohydrate (SPIRIVA  RESPIMAT) 2.5 MCG/ACT AERS Inhale 2 puffs into the lungs daily in the afternoon.   valsartan  (DIOVAN ) 40 MG tablet TAKE ONE TABLET BY MOUTH ONCE DAILY.   [DISCONTINUED] amLODipine  (NORVASC ) 2.5 MG tablet TAKE ONE TABLET BY MOUTH ONCE DAILY.   [DISCONTINUED] benzonatate  (TESSALON ) 200 MG capsule Take 1 capsule (200 mg total) by mouth 3 (three) times daily as needed. Swallow a whole, do not chew   [DISCONTINUED] predniSONE  (DELTASONE ) 20 MG tablet Take 2 tablets (40 mg total) by mouth daily.   No facility-administered encounter medications on file as of 12/05/2024.    Allergies as of 12/05/2024 - Review Complete 11/27/2024  Allergen Reaction Noted   Adhesive [tape] Other (See Comments) 07/18/2012   Avelox [moxifloxacin] Anaphylaxis 01/27/2016   Bactrim [sulfamethoxazole-trimethoprim] Anaphylaxis, Shortness Of Breath, Rash, and Other (See Comments) 07/18/2012   Levaquin [levofloxacin] Anaphylaxis, Shortness Of Breath, Rash, and Other (See Comments) 03/31/2013   Mucinex  [guaifenesin  er] Anaphylaxis, Shortness Of Breath, and Rash 04/18/2014   Peanut-containing drug products Anaphylaxis 08/12/2020   Penicillins Other (See Comments) 07/18/2012   Sudafed [pseudoephedrine hcl] Shortness Of Breath, Rash, and Other (See Comments) 07/18/2012   Crestor [rosuvastatin] Other (See Comments) 04/15/2014   Lipitor [atorvastatin] Other (See Comments) 04/15/2014   Vytorin [ezetimibe-simvastatin] Other (See Comments) 04/15/2014   Fish allergy Nausea And Vomiting 04/14/2022   Fish oil Nausea And Vomiting 11/25/2020   Norvasc  [amlodipine ] Other (See Comments) 04/15/2014   Shrimp [shellfish allergy] Nausea And Vomiting 08/12/2020    Past Medical History:  Diagnosis Date   Anginal pain    Anxiety    Anxiety disorder    With apparent panic attacks   Arthritis    hands, knees, back  (09/18/2017)   CAD S/P percutaneous coronary angioplasty 08/2017   Single-vessel CAD involving second diagonal branch treated with DES stent Synergy 2.25 mm x 12 mm (2.4 mm)   Chronic back pain    all over (09/18/2017)   COPD (chronic obstructive pulmonary disease) (HCC)    PFTs were done in 2008 at [facility]   Depression    Dyspnea    Dysrhythmia    LBBB   Essential hypertension    GERD (gastroesophageal reflux disease)    Hiatal hernia    History of left bundle branch block (LBBB) 08/2017   Rate Related LBBB noted in cath lab   Hyperlipidemia    Macular degeneration    right eye   Myocardial infarction [facility]) 2007   medically induced   Nonocclusive coronary atherosclerosis of native coronary artery 2006 through 2012   multi caths 2012, 2006, 2007 2009 (2007 complicated by catheter-induced dissection of small nondominant RCA, patent in 2009 with no residual abnormality); Myoview  August 2013: LOW RISK, normal EF. ; Echocardiogram [facility] - January 2014) moderate LVH, EF 55-65%. No significant valvular disease.   OSA (obstructive sleep apnea) 9/25/ 2012   tested 2009; tetested sleep study 07/2011--titration 09/20/2011 now use  Bi-PAP   OSA on CPAP    Pneumonia    several times in 2017; 3 times already this year (09/18/2017)   Scoliosis    Type II diabetes mellitus [facility])     Past Surgical History:  Procedure Laterality Date   BIOPSY  04/20/2022   Procedure: BIOPSY;  Surgeon: Golda Claudis PENNER, MD;  Location: [facility];  Service: Endoscopy;;   CARDIAC CATHETERIZATION  [id number]; 2009   Non-occlusice CAD - only 80% ostial SP1;  NON DOMINANNT  RCA (catheter insuced dissection with MI in 2007 --> resolved by 2009 cath)   CATARACT EXTRACTION, BILATERAL Bilateral 2017   Toric lens in the left eye only   COLONOSCOPY WITH PROPOFOL  N/A 11/09/2018   Procedure: COLONOSCOPY WITH PROPOFOL ;  Surgeon: Golda Claudis PENNER, MD;  Location: [facility];  Service: Endoscopy;  Laterality:  N/A;  2:25   CORONARY ANGIOPLASTY     x 1 stent   CORONARY PRESSURE/FFR STUDY N/A 09/18/2017   Procedure: INTRAVASCULAR PRESSURE WIRE/FFR STUDY;  Surgeon: Anner Alm ORN, MD;  Location: [facility] INVASIVE CV LAB;  Service: Cardiovascular: FFR Diag 2 -- 0.78 (significcant) --> PCI   CORONARY STENT INTERVENTION N/A 09/18/2017   Procedure: CORONARY STENT INTERVENTION;  Surgeon: Anner Alm ORN, MD;  Location: [facility] INVASIVE CV LAB: DES PCI Diag2: Synergy DES 2.25 mm x 12 mm (2.4 mm)   DIAGNOSTIC LAPAROSCOPY Right    DILATION AND CURETTAGE OF UTERUS     ESOPHAGEAL DILATION N/A 04/20/2022   Procedure: ESOPHAGEAL DILATION;  Surgeon: Golda Claudis PENNER, MD;  [facility]: [facility];  Service: Endoscopy;  Laterality: N/A;   ESOPHAGOGASTRODUODENOSCOPY (EGD) WITH PROPOFOL  N/A 04/20/2022   Procedure: ESOPHAGOGASTRODUODENOSCOPY (EGD) WITH PROPOFOL ;  Surgeon: Golda Claudis PENNER, MD;  Location: [facility];  Service: Endoscopy;  Laterality: N/A;  240   EYE SURGERY     KNEE ARTHROSCOPY Right    LEFT HEART CATH AND CORONARY ANGIOGRAPHY N/A 09/18/2017   Procedure: LEFT HEART CATH AND CORONARY ANGIOGRAPHY;  Surgeon: Anner Alm ORN, MD;  Location: [facility] INVASIVE CV LAB: Culpril - 80% Diag2 (FFR 0.78)- PCI.  pLAD 40%, pCx 40%. Post PCI LBBB. LVEDP - ~20 mmHg. EF 55-60%   LIPOMA EXCISION Right ~ 2015   anterior abdomen   NM MYOVIEW  LTD  08/04/2021   LOW RISK. EF 55-60%. Mild fixed apical defect - /w breast attenuation.   PARS PLANA VITRECTOMY W/ REPAIR OF MACULAR HOLE Right    Unsuccessful repair.  Hole filled   POLYPECTOMY  11/09/2018   Procedure: POLYPECTOMY;  Surgeon: Golda Claudis PENNER, MD;  [facility];  Service: Endoscopy;;  colon   POLYPECTOMY  04/20/2022   Procedure: POLYPECTOMY;  Surgeon: Golda Claudis PENNER, MD;  [facility];  Service: Endoscopy;;  bx of polyps   TRANSTHORACIC ECHOCARDIOGRAM  08/04/2021   Normal LVEF 55-60%.  Septal movement consistent with LBBB now present.  Unable to assess  diastolic function.  Normal RV size and function.  Unable assess PAP.  Normal aortic and mitral valves.  Normal RAP.   VAGINAL HYSTERECTOMY      Family History  Problem Relation Age of Onset   CAD Mother    Breast cancer Maternal Aunt    Breast cancer Paternal Aunt     Social History   Socioeconomic History   Marital status: Married    Spouse name: Not on file   Number of children: Not on file   Years of education: Not on file  Highest education level: Not on file  Occupational History   Not on file  Tobacco Use   Smoking status: Former    Current packs/day: 0.00    Average packs/day: 3.0 packs/day for 8.5 years (25.5 ttl pk-yrs)    Types: Cigarettes    Start date: 10/19/1967    Quit date: 04/18/1976    Years since quitting: 48.6    Passive exposure: Past   Smokeless tobacco: Never  Vaping Use   Vaping status: Never Used  Substance and Sexual Activity   Alcohol  use: No   Drug use: No   Sexual activity: Not Currently    Birth control/protection: None  Other Topics Concern   Not on file  Social History Narrative   Married mother of 4, grandmother of 7. Her mother is 55 years old. Quit smoking 34 years ago. Does not drink alcohol .  Retired from [employer] in 2012.   Usually presents with oldest daughter.   Previously worked out at [employer] regularly walking 1/2-1 mile a day, but no longer able to do so because of the [employer] decision to no longer cover [employer] cost.      Was made DNI during her hospital stay for COVID-19 in January 2021, now that she has recovered from [facility] and doing well, she has rescinded this and is now full code.   Social Drivers of Health   Tobacco Use: Medium Risk (12/05/2024)   Patient History    Smoking Tobacco Use: Former    Smokeless Tobacco Use: Never    Passive Exposure: Past  Physicist, Medical Strain: Low Risk (11/29/2022)   Overall Financial Resource Strain (CARDIA)    Difficulty of Paying Living  Expenses: Not hard at all  Food Insecurity: No Food Insecurity (11/29/2022)   Hunger Vital Sign    Worried About Running Out of Food in the Last Year: Never true    Ran Out of Food in the Last Year: Never true  Transportation Needs: No Transportation Needs (11/29/2022)   PRAPARE - Administrator, Civil Service (Medical): No    Lack of Transportation (Non-Medical): No  Physical Activity: Not on file  Stress: Not on file  Social Connections: Not on file  Intimate Partner Violence: Not on file  Depression (EYV7-0): Not on file  Alcohol  Screen: Not on file  Housing: Low Risk (11/29/2022)   Housing    Last Housing Risk Score: 0  Utilities: Not At Risk (11/29/2022)   AHC Utilities    Threatened with loss of utilities: No  Health Literacy: Not on file    Review of Systems  Respiratory:  Positive for shortness of breath.     Vitals:   12/05/24 1344  BP: 121/60  Pulse: 79  SpO2: 93%     Physical Exam Constitutional:      Appearance: She is obese.  HENT:     Head: Normocephalic.     Nose: Nose normal.     Mouth/Throat:     Mouth: Mucous membranes are moist.  Eyes:     Pupils: Pupils are equal, round, and reactive to light.  Cardiovascular:     Rate and Rhythm: Normal rate and regular rhythm.     Heart sounds: No murmur heard.    No friction rub.  Pulmonary:     Effort: Pulmonary effort is normal. No respiratory distress.     Breath sounds: No stridor. No wheezing or rhonchi.  Musculoskeletal:     Cervical back:  No rigidity or tenderness.  Neurological:     General: No focal deficit present.     Mental Status: She is alert.  Psychiatric:        Mood and Affect: Mood normal.    Data Reviewed: Compliance data reviewed showing excellent compliance average use of 6 hours 25 minutes CPAP of 9 Residual AHI of 8  Pulmonary function test on 12/05/2024-no obstruction, no restriction with normal diffusing capacity-FEV1, FVC, total lung capacity and diffusing  capacity all improved compared to previous  CT chest 06/22/2023-stable fibrotic changes  Assessment and Plan Assessment & Plan Obstructive sleep apnea Chronic obstructive sleep apnea with long-standing issues using CPAP due to discomfort and irritation. CPAP is essential to prevent airway collapse during sleep, reducing stress on the heart and breathing. Alternative treatments like surgery are not preferred due to potential complications and lack of guaranteed outcomes. - Continue CPAP therapy with efforts to find a better-fitting mask.  Pulmonary fibrosis Chronic pulmonary fibrosis with scarring in the lungs, likely secondary to previous infections and inflammation, including COVID-19. Scarring is irreversible and may limit lung function. Current pulmonary function tests show improvement compared to previous measurements. Antiscarring medications are not indicated due to potential side effects like diarrhea and lack of necessity for COVID-related scarring. - Encouraged regular exercise to maintain muscle strength and lung function. - Instructed to monitor for any progression of symptoms, such as increased shortness of breath with activity.  Acute bronchitis Recent episode of acute bronchitis treated with prednisone  and doxycycline , resulting in symptom improvement. Symptoms included wheezing and productive cough with thick phlegm. Pulmonary function tests show improvement compared to previous measurements. - Continue current inhaler regimen. - Encouraged gradual return to regular exercise routine.  No changes to inhaler at the present time we will continue Arnuity and Spiriva   Encourage graded activities as tolerated  Follow-up in about 6 months  Encouraged to call with significant concerns   No orders of the defined types were placed in this encounter.     Jennet Epley MD [facility] 12/05/2024, 2:19 PM  CC: Marvine Rush, MD

## 2024-12-17 ENCOUNTER — Other Ambulatory Visit: Payer: Self-pay | Admitting: Pulmonary Disease

## 2024-12-18 ENCOUNTER — Other Ambulatory Visit: Payer: Self-pay | Admitting: Cardiology
